# Patient Record
Sex: Male | Born: 1960 | Race: White | Hispanic: No | Marital: Married | Smoking: Current every day smoker
Health system: Southern US, Community
[De-identification: ages and names within clinical notes are randomized; demographics above are authoritative.]

## PROBLEM LIST (undated history)

## (undated) ENCOUNTER — Ambulatory Visit: Admission: EM | Payer: 59

## (undated) DIAGNOSIS — I2510 Atherosclerotic heart disease of native coronary artery without angina pectoris: Secondary | ICD-10-CM

## (undated) DIAGNOSIS — F1410 Cocaine abuse, uncomplicated: Secondary | ICD-10-CM

## (undated) DIAGNOSIS — E785 Hyperlipidemia, unspecified: Secondary | ICD-10-CM

## (undated) DIAGNOSIS — R569 Unspecified convulsions: Secondary | ICD-10-CM

## (undated) DIAGNOSIS — E669 Obesity, unspecified: Secondary | ICD-10-CM

## (undated) DIAGNOSIS — K219 Gastro-esophageal reflux disease without esophagitis: Secondary | ICD-10-CM

## (undated) DIAGNOSIS — F17200 Nicotine dependence, unspecified, uncomplicated: Secondary | ICD-10-CM

## (undated) DIAGNOSIS — I219 Acute myocardial infarction, unspecified: Secondary | ICD-10-CM

## (undated) DIAGNOSIS — M545 Low back pain, unspecified: Secondary | ICD-10-CM

## (undated) DIAGNOSIS — F419 Anxiety disorder, unspecified: Secondary | ICD-10-CM

## (undated) DIAGNOSIS — G4733 Obstructive sleep apnea (adult) (pediatric): Secondary | ICD-10-CM

## (undated) HISTORY — DX: Low back pain: M54.5

## (undated) HISTORY — DX: Unspecified convulsions: R56.9

## (undated) HISTORY — DX: Gastro-esophageal reflux disease without esophagitis: K21.9

## (undated) HISTORY — DX: Hyperlipidemia, unspecified: E78.5

## (undated) HISTORY — DX: Nicotine dependence, unspecified, uncomplicated: F17.200

## (undated) HISTORY — DX: Anxiety disorder, unspecified: F41.9

## (undated) HISTORY — PX: FRACTURE SURGERY: SHX138

## (undated) HISTORY — DX: Low back pain, unspecified: M54.50

## (undated) HISTORY — DX: Acute myocardial infarction, unspecified: I21.9

## (undated) HISTORY — DX: Obstructive sleep apnea (adult) (pediatric): G47.33

## (undated) HISTORY — DX: Obesity, unspecified: E66.9

## (undated) HISTORY — DX: Atherosclerotic heart disease of native coronary artery without angina pectoris: I25.10

## (undated) HISTORY — DX: Cocaine abuse, uncomplicated: F14.10

## (undated) HISTORY — PX: BACK SURGERY: SHX140

---

## 2002-06-01 ENCOUNTER — Emergency Department (HOSPITAL_COMMUNITY): Admission: EM | Admit: 2002-06-01 | Discharge: 2002-06-01 | Payer: Self-pay | Admitting: *Deleted

## 2002-06-01 ENCOUNTER — Encounter: Payer: Self-pay | Admitting: *Deleted

## 2003-07-23 ENCOUNTER — Inpatient Hospital Stay (HOSPITAL_COMMUNITY): Admission: EM | Admit: 2003-07-23 | Discharge: 2003-07-25 | Payer: Self-pay | Admitting: Emergency Medicine

## 2003-09-25 ENCOUNTER — Emergency Department (HOSPITAL_COMMUNITY): Admission: EM | Admit: 2003-09-25 | Discharge: 2003-09-25 | Payer: Self-pay | Admitting: Emergency Medicine

## 2004-12-13 ENCOUNTER — Ambulatory Visit: Payer: Self-pay | Admitting: Internal Medicine

## 2005-01-08 ENCOUNTER — Ambulatory Visit: Payer: Self-pay

## 2005-01-29 ENCOUNTER — Ambulatory Visit: Payer: Self-pay | Admitting: Pulmonary Disease

## 2005-02-26 ENCOUNTER — Ambulatory Visit: Payer: Self-pay | Admitting: Pulmonary Disease

## 2006-01-16 ENCOUNTER — Ambulatory Visit: Payer: Self-pay | Admitting: Internal Medicine

## 2006-04-21 ENCOUNTER — Ambulatory Visit: Payer: Self-pay | Admitting: Internal Medicine

## 2006-07-25 ENCOUNTER — Ambulatory Visit: Payer: Self-pay | Admitting: Internal Medicine

## 2006-12-20 ENCOUNTER — Encounter: Payer: Self-pay | Admitting: Internal Medicine

## 2006-12-20 DIAGNOSIS — I209 Angina pectoris, unspecified: Secondary | ICD-10-CM | POA: Insufficient documentation

## 2006-12-20 DIAGNOSIS — I252 Old myocardial infarction: Secondary | ICD-10-CM | POA: Insufficient documentation

## 2006-12-20 DIAGNOSIS — K219 Gastro-esophageal reflux disease without esophagitis: Secondary | ICD-10-CM

## 2006-12-20 DIAGNOSIS — I25119 Atherosclerotic heart disease of native coronary artery with unspecified angina pectoris: Secondary | ICD-10-CM

## 2006-12-20 DIAGNOSIS — F419 Anxiety disorder, unspecified: Secondary | ICD-10-CM

## 2007-02-26 ENCOUNTER — Encounter: Payer: Self-pay | Admitting: Internal Medicine

## 2007-02-26 ENCOUNTER — Ambulatory Visit: Payer: Self-pay | Admitting: Internal Medicine

## 2007-03-09 ENCOUNTER — Telehealth: Payer: Self-pay | Admitting: Internal Medicine

## 2007-03-10 DIAGNOSIS — G40909 Epilepsy, unspecified, not intractable, without status epilepticus: Secondary | ICD-10-CM | POA: Insufficient documentation

## 2007-05-08 ENCOUNTER — Telehealth: Payer: Self-pay | Admitting: Internal Medicine

## 2007-07-14 ENCOUNTER — Telehealth: Payer: Self-pay | Admitting: Internal Medicine

## 2007-08-20 ENCOUNTER — Ambulatory Visit: Payer: Self-pay | Admitting: Internal Medicine

## 2007-08-20 DIAGNOSIS — K921 Melena: Secondary | ICD-10-CM | POA: Insufficient documentation

## 2007-08-20 DIAGNOSIS — F17200 Nicotine dependence, unspecified, uncomplicated: Secondary | ICD-10-CM | POA: Insufficient documentation

## 2007-08-20 HISTORY — DX: Melena: K92.1

## 2007-09-03 ENCOUNTER — Ambulatory Visit: Payer: Self-pay | Admitting: Pulmonary Disease

## 2007-09-03 DIAGNOSIS — G478 Other sleep disorders: Secondary | ICD-10-CM | POA: Insufficient documentation

## 2007-09-08 ENCOUNTER — Ambulatory Visit (HOSPITAL_BASED_OUTPATIENT_CLINIC_OR_DEPARTMENT_OTHER): Admission: RE | Admit: 2007-09-08 | Discharge: 2007-09-08 | Payer: Self-pay | Admitting: Pulmonary Disease

## 2007-09-08 ENCOUNTER — Ambulatory Visit: Payer: Self-pay | Admitting: Pulmonary Disease

## 2007-09-09 ENCOUNTER — Ambulatory Visit: Payer: Self-pay | Admitting: Gastroenterology

## 2007-09-10 ENCOUNTER — Encounter: Payer: Self-pay | Admitting: Pulmonary Disease

## 2007-10-12 HISTORY — PX: COLONOSCOPY: SHX174

## 2007-10-13 ENCOUNTER — Encounter: Payer: Self-pay | Admitting: Gastroenterology

## 2007-10-13 ENCOUNTER — Ambulatory Visit: Payer: Self-pay | Admitting: Gastroenterology

## 2007-10-13 LAB — HM COLONOSCOPY

## 2007-10-16 ENCOUNTER — Encounter: Payer: Self-pay | Admitting: Gastroenterology

## 2007-12-15 ENCOUNTER — Ambulatory Visit: Payer: Self-pay | Admitting: Internal Medicine

## 2008-03-16 ENCOUNTER — Encounter: Payer: Self-pay | Admitting: Internal Medicine

## 2008-03-17 ENCOUNTER — Ambulatory Visit: Payer: Self-pay | Admitting: Internal Medicine

## 2008-06-15 ENCOUNTER — Ambulatory Visit: Payer: Self-pay | Admitting: Internal Medicine

## 2008-06-15 LAB — CONVERTED CEMR LAB
BUN: 14 mg/dL (ref 6–23)
CO2: 28 meq/L (ref 19–32)
Calcium: 8.9 mg/dL (ref 8.4–10.5)
Chloride: 107 meq/L (ref 96–112)
Cholesterol: 181 mg/dL (ref 0–200)
Creatinine, Ser: 0.8 mg/dL (ref 0.4–1.5)
GFR calc Af Amer: 134 mL/min
GFR calc non Af Amer: 111 mL/min
Glucose, Bld: 147 mg/dL — ABNORMAL HIGH (ref 70–99)
HDL: 38.6 mg/dL — ABNORMAL LOW (ref >39.0)
LDL Cholesterol: 114 mg/dL — ABNORMAL HIGH (ref 0–99)
Potassium: 3.9 meq/L (ref 3.5–5.1)
Sodium: 142 meq/L (ref 135–145)
TSH: 1.89 microintl units/mL (ref 0.35–5.50)
Total CHOL/HDL Ratio: 4.7
Triglycerides: 142 mg/dL (ref 0–149)
VLDL: 28 mg/dL (ref 0–40)

## 2008-06-16 ENCOUNTER — Ambulatory Visit: Payer: Self-pay | Admitting: Internal Medicine

## 2008-06-16 DIAGNOSIS — R7309 Other abnormal glucose: Secondary | ICD-10-CM | POA: Insufficient documentation

## 2008-09-30 ENCOUNTER — Ambulatory Visit: Payer: Self-pay | Admitting: Internal Medicine

## 2008-09-30 LAB — CONVERTED CEMR LAB
BUN: 13 mg/dL (ref 6–23)
CO2: 28 meq/L (ref 19–32)
Calcium: 8.7 mg/dL (ref 8.4–10.5)
Chloride: 111 meq/L (ref 96–112)
Cholesterol: 183 mg/dL (ref 0–200)
Creatinine, Ser: 0.8 mg/dL (ref 0.4–1.5)
Direct LDL: 106 mg/dL
GFR calc non Af Amer: 110.41 mL/min (ref >60)
Glucose, Bld: 106 mg/dL — ABNORMAL HIGH (ref 70–99)
HDL: 35.4 mg/dL — ABNORMAL LOW (ref >39.00)
Hgb A1c MFr Bld: 6.2 % (ref 4.6–6.5)
PSA: 0.25 ng/mL (ref 0.10–4.00)
Potassium: 4.1 meq/L (ref 3.5–5.1)
Sodium: 144 meq/L (ref 135–145)
Total CHOL/HDL Ratio: 5
Triglycerides: 359 mg/dL — ABNORMAL HIGH (ref 0.0–149.0)
VLDL: 71.8 mg/dL — ABNORMAL HIGH (ref 0.0–40.0)

## 2008-10-03 ENCOUNTER — Ambulatory Visit: Payer: Self-pay | Admitting: Internal Medicine

## 2008-10-14 ENCOUNTER — Telehealth: Payer: Self-pay | Admitting: Internal Medicine

## 2008-10-14 ENCOUNTER — Ambulatory Visit: Payer: Self-pay | Admitting: Internal Medicine

## 2008-10-14 DIAGNOSIS — L02419 Cutaneous abscess of limb, unspecified: Secondary | ICD-10-CM

## 2008-10-14 DIAGNOSIS — L03119 Cellulitis of unspecified part of limb: Secondary | ICD-10-CM

## 2008-10-14 DIAGNOSIS — T148XXA Other injury of unspecified body region, initial encounter: Secondary | ICD-10-CM | POA: Insufficient documentation

## 2008-10-14 HISTORY — DX: Cellulitis of unspecified part of limb: L02.419

## 2009-01-31 ENCOUNTER — Ambulatory Visit: Payer: Self-pay | Admitting: Internal Medicine

## 2009-02-16 ENCOUNTER — Ambulatory Visit: Payer: Self-pay | Admitting: Pulmonary Disease

## 2009-02-16 DIAGNOSIS — G4733 Obstructive sleep apnea (adult) (pediatric): Secondary | ICD-10-CM | POA: Insufficient documentation

## 2009-03-31 ENCOUNTER — Telehealth (INDEPENDENT_AMBULATORY_CARE_PROVIDER_SITE_OTHER): Payer: Self-pay | Admitting: *Deleted

## 2009-04-25 ENCOUNTER — Ambulatory Visit: Payer: Self-pay | Admitting: Internal Medicine

## 2009-04-25 DIAGNOSIS — R079 Chest pain, unspecified: Secondary | ICD-10-CM | POA: Insufficient documentation

## 2009-04-25 DIAGNOSIS — M79609 Pain in unspecified limb: Secondary | ICD-10-CM

## 2009-04-25 DIAGNOSIS — M5416 Radiculopathy, lumbar region: Secondary | ICD-10-CM | POA: Insufficient documentation

## 2009-04-25 HISTORY — DX: Pain in unspecified limb: M79.609

## 2009-04-28 ENCOUNTER — Telehealth: Payer: Self-pay | Admitting: Internal Medicine

## 2009-05-10 ENCOUNTER — Telehealth (INDEPENDENT_AMBULATORY_CARE_PROVIDER_SITE_OTHER): Payer: Self-pay | Admitting: *Deleted

## 2009-05-10 ENCOUNTER — Ambulatory Visit: Payer: Self-pay | Admitting: Cardiovascular Disease

## 2009-05-10 ENCOUNTER — Ambulatory Visit: Payer: Self-pay

## 2009-05-10 ENCOUNTER — Encounter (HOSPITAL_COMMUNITY): Admission: RE | Admit: 2009-05-10 | Discharge: 2009-07-17 | Payer: Self-pay | Admitting: Internal Medicine

## 2009-05-11 ENCOUNTER — Ambulatory Visit: Payer: Self-pay

## 2009-05-11 ENCOUNTER — Encounter: Payer: Self-pay | Admitting: Internal Medicine

## 2009-05-16 ENCOUNTER — Telehealth: Payer: Self-pay | Admitting: Internal Medicine

## 2009-06-12 ENCOUNTER — Ambulatory Visit: Payer: Self-pay | Admitting: Internal Medicine

## 2009-06-12 ENCOUNTER — Telehealth (INDEPENDENT_AMBULATORY_CARE_PROVIDER_SITE_OTHER): Payer: Self-pay | Admitting: *Deleted

## 2009-06-13 LAB — CONVERTED CEMR LAB
Albumin: 4.3 g/dL (ref 3.5–5.2)
Alkaline Phosphatase: 72 units/L (ref 39–117)
BUN: 13 mg/dL (ref 6–23)
Calcium: 9.1 mg/dL (ref 8.4–10.5)
Cholesterol: 174 mg/dL (ref 0–200)
Creatinine, Ser: 0.8 mg/dL (ref 0.4–1.5)
Direct LDL: 79.6 mg/dL
Eosinophils Relative: 2.9 % (ref 0.0–5.0)
GFR calc non Af Amer: 110.07 mL/min (ref >60)
Glucose, Bld: 124 mg/dL — ABNORMAL HIGH (ref 70–99)
HCT: 48.9 % (ref 39.0–52.0)
Ketones, ur: NEGATIVE mg/dL
Lymphs Abs: 1.8 10*3/uL (ref 0.7–4.0)
MCV: 91.7 fL (ref 78.0–100.0)
Monocytes Absolute: 0.5 10*3/uL (ref 0.1–1.0)
Neutro Abs: 3.1 10*3/uL (ref 1.4–7.7)
Platelets: 136 10*3/uL — ABNORMAL LOW (ref 150.0–400.0)
Sodium: 141 meq/L (ref 135–145)
Specific Gravity, Urine: 1.02 (ref 1.000–1.030)
TSH: 1.94 microintl units/mL (ref 0.35–5.50)
Triglycerides: 317 mg/dL — ABNORMAL HIGH (ref 0.0–149.0)
Uric Acid, Serum: 7.5 mg/dL (ref 4.0–7.8)
Urine Glucose: NEGATIVE mg/dL
WBC: 5.6 10*3/uL (ref 4.5–10.5)
pH: 7 (ref 5.0–8.0)

## 2009-08-18 ENCOUNTER — Telehealth: Payer: Self-pay | Admitting: Internal Medicine

## 2009-08-20 ENCOUNTER — Encounter: Payer: Self-pay | Admitting: Pulmonary Disease

## 2009-08-27 ENCOUNTER — Encounter: Payer: Self-pay | Admitting: Pulmonary Disease

## 2009-09-12 ENCOUNTER — Ambulatory Visit: Payer: Self-pay | Admitting: Internal Medicine

## 2009-12-19 ENCOUNTER — Telehealth: Payer: Self-pay | Admitting: Internal Medicine

## 2009-12-20 ENCOUNTER — Ambulatory Visit: Payer: Self-pay | Admitting: Internal Medicine

## 2010-01-08 ENCOUNTER — Telehealth: Payer: Self-pay | Admitting: Cardiology

## 2010-01-08 DIAGNOSIS — R0602 Shortness of breath: Secondary | ICD-10-CM | POA: Insufficient documentation

## 2010-01-10 ENCOUNTER — Ambulatory Visit: Payer: Self-pay

## 2010-01-10 ENCOUNTER — Ambulatory Visit (HOSPITAL_COMMUNITY): Admission: RE | Admit: 2010-01-10 | Discharge: 2010-01-10 | Payer: Self-pay | Admitting: Cardiology

## 2010-01-10 ENCOUNTER — Ambulatory Visit: Payer: Self-pay | Admitting: Cardiovascular Disease

## 2010-01-10 ENCOUNTER — Encounter: Payer: Self-pay | Admitting: Cardiology

## 2010-01-11 ENCOUNTER — Ambulatory Visit: Payer: Self-pay | Admitting: Cardiology

## 2010-01-11 DIAGNOSIS — I10 Essential (primary) hypertension: Secondary | ICD-10-CM | POA: Insufficient documentation

## 2010-01-11 DIAGNOSIS — E785 Hyperlipidemia, unspecified: Secondary | ICD-10-CM | POA: Insufficient documentation

## 2010-01-12 ENCOUNTER — Encounter: Payer: Self-pay | Admitting: Internal Medicine

## 2010-01-17 ENCOUNTER — Inpatient Hospital Stay (HOSPITAL_BASED_OUTPATIENT_CLINIC_OR_DEPARTMENT_OTHER): Admission: RE | Admit: 2010-01-17 | Discharge: 2010-01-17 | Payer: Self-pay | Admitting: Cardiology

## 2010-01-17 ENCOUNTER — Ambulatory Visit: Payer: Self-pay | Admitting: Cardiology

## 2010-02-08 ENCOUNTER — Ambulatory Visit: Payer: Self-pay | Admitting: Cardiology

## 2010-03-23 ENCOUNTER — Ambulatory Visit: Payer: Self-pay | Admitting: Internal Medicine

## 2010-03-26 LAB — CONVERTED CEMR LAB
BUN: 20 mg/dL (ref 6–23)
Bilirubin, Direct: 0.1 mg/dL (ref 0.0–0.3)
CO2: 29 meq/L (ref 19–32)
Chloride: 107 meq/L (ref 96–112)
Cholesterol: 176 mg/dL (ref 0–200)
Direct LDL: 102.4 mg/dL
Glucose, Bld: 108 mg/dL — ABNORMAL HIGH (ref 70–99)
HDL: 32.3 mg/dL — ABNORMAL LOW (ref >39.00)
Potassium: 4.6 meq/L (ref 3.5–5.1)
Total Bilirubin: 1.1 mg/dL (ref 0.3–1.2)
Total CHOL/HDL Ratio: 5
Total Protein: 6.6 g/dL (ref 6.0–8.3)
Triglycerides: 232 mg/dL — ABNORMAL HIGH (ref 0.0–149.0)

## 2010-05-16 ENCOUNTER — Telehealth: Payer: Self-pay | Admitting: Internal Medicine

## 2010-06-10 LAB — CONVERTED CEMR LAB
BUN: 20 mg/dL (ref 6–23)
Basophils Absolute: 0 10*3/uL (ref 0.0–0.1)
Basophils Relative: 0.3 % (ref 0.0–3.0)
CO2: 27 meq/L (ref 19–32)
Calcium: 9.6 mg/dL (ref 8.4–10.5)
Chloride: 105 meq/L (ref 96–112)
Creatinine, Ser: 0.8 mg/dL (ref 0.4–1.5)
Eosinophils Absolute: 0.2 10*3/uL (ref 0.0–0.7)
Eosinophils Relative: 2.6 % (ref 0.0–5.0)
GFR calc non Af Amer: 116.49 mL/min (ref >60)
Glucose, Bld: 88 mg/dL (ref 70–99)
HCT: 45.1 % (ref 39.0–52.0)
Hemoglobin: 16.1 g/dL (ref 13.0–17.0)
INR: 1 (ref 0.8–1.0)
Lymphocytes Relative: 31.5 % (ref 12.0–46.0)
Lymphs Abs: 2.7 10*3/uL (ref 0.7–4.0)
MCHC: 35.7 g/dL (ref 30.0–36.0)
MCV: 90.6 fL (ref 78.0–100.0)
Monocytes Absolute: 0.8 10*3/uL (ref 0.1–1.0)
Monocytes Relative: 9.4 % (ref 3.0–12.0)
Neutro Abs: 4.9 10*3/uL (ref 1.4–7.7)
Neutrophils Relative %: 56.2 % (ref 43.0–77.0)
Platelets: 165 10*3/uL (ref 150.0–400.0)
Potassium: 4.2 meq/L (ref 3.5–5.1)
Prothrombin Time: 10.6 s (ref 9.7–11.8)
RBC: 4.98 M/uL (ref 4.22–5.81)
RDW: 12.7 % (ref 11.5–14.6)
Sodium: 141 meq/L (ref 135–145)
WBC: 8.7 10*3/uL (ref 4.5–10.5)

## 2010-06-12 NOTE — Progress Notes (Signed)
Summary: xanax   Phone Note Call from Patient Call back at Home Phone [phone] Call back at [phone]   Caller: Patient Call For: Dr Cletis Media Summary of Call: Pt states pharamacy has been [facility]ing prescriptions [facility] X[facility]ex[phone] and pharmacy states office has not responded. Initial call taken by: Verdell Face,  August 18, 2009 11:11 AM  Follow-up for Phone Call        Is it ok to refill this medication for pt?Marland KitchenMarland KitchenMarland KitchenAlvy Beal Archie CMA  August 18, 2009 11:36 AM   Additional Follow-up for Phone Call Additional follow up Details #1::        OK x3 Additional Follow-up by: Tresa Garter MD,  August 18, 2009 3:12 PM    Additional Follow-up for Phone Call Additional follow up Details #2::    Pt informed rx called in  Follow-up by: Lamar Sprinkles, CMA,  August 18, 2009 4:28 PM  Prescriptions: ALPRAZOLAM 2 MG  TABS (ALPRAZOLAM) tid as needed  #90 x 2   Entered by:   Lamar Sprinkles, CMA   Authorized by:   Tresa Garter MD   Signed by:   Lamar Sprinkles, CMA on 08/18/2009   Method used:   Telephoned to ...       [facility] [phone]* (retail)       [address]       [place], [place]  [address]       Ph: [phone]       Fax: [phone]   RxID:   [id number]

## 2010-06-12 NOTE — Miscellaneous (Signed)
  Clinical Lists Changes  Medications: Rx of FREESTYLE LITE TEST  STRP (GLUCOSE BLOOD) once daily prn;  #50 x 6;  Signed;  Entered by: Tresa Garter MD;  Authorized by: Tresa Garter MD;  Method used: Print then Give to Patient    Prescriptions: FREESTYLE LITE TEST  STRP (GLUCOSE BLOOD) once daily prn  #50 x 6   Entered and Authorized by:   Tresa Garter MD   Signed by:   Tresa Garter MD on 01/12/2010   Method used:   Print then Give to Patient   RxID:   [id number]

## 2010-06-12 NOTE — Cardiovascular Report (Signed)
Summary: Pre Cath Orders   Pre Cath Orders   Imported By: Roderic Ovens 01/24/2010 15:08:47  _____________________________________________________________________  External Attachment:    Type:   Image     Comment:   External Document

## 2010-06-12 NOTE — Progress Notes (Signed)
Summary: REQ A CALL FROM DR  Phone Note Call from Patient Call back at Work Phone [phone] Call back at [phone]   Summary of Call: Patient is requesting a call from Dr Posey Rea.   Initial call taken by: Lamar Sprinkles, CMA,  May 16, 2009 1:58 PM  Follow-up for Phone Call        WANTS RESULTS OF STRESS TEST. Follow-up by: Hilarie Fredrickson,  May 17, 2009 3:17 PM  Additional Follow-up for Phone Call Additional follow up Details #1::        Stress test  was OK. Keep return office visit, stay on diet Additional Follow-up by: Tresa Garter MD,  May 18, 2009 1:08 PM    Additional Follow-up for Phone Call Additional follow up Details #2::    Spoke with patient, informed of above. He was very upset that Dr Posey Rea did not call him back directly. I offered pt an apt tomorrow with Dr Posey Rea and offered to help him with his questions. He said it was not about him, it was about his wife. So I offered an apt for her, he screamed at me twice that he was going to come up to the office tomorrow [employer]) and show his a** then he hung up.  Follow-up by: Lamar Sprinkles, CMA,  May 18, 2009 4:23 PM  Additional Follow-up for Phone Call Additional follow up Details #3:: Details for Additional Follow-up Action Taken: I called him - he apologized for rudeness - he was very stressed about his wife Additional Follow-up by: Tresa Garter MD,  May 19, 2009 1:22 PM

## 2010-06-12 NOTE — Assessment & Plan Note (Signed)
Summary: 3 MO ROV /NWS  #   Vital Signs:  Patient profile:   50 year old male Height:      72 inches Weight:      319.75 pounds BMI:     43.52 O2 Sat:      94 % on Room air Temp:     98.0 degrees F oral Pulse rate:   91 / minute BP sitting:   140 / 80  (left arm) Cuff size:   large  Vitals Entered By: Lucious Groves (Sep 12, 2009 8:15 AM)  O2 Flow:  Room air CC: 3 mo rtn ov-Pt also needs refills of  Alprazolam, Naproxen, and Tramadol./kb Is Patient Diabetic? Yes Pain Assessment Patient in pain? no        Primary Care Provider:  Tresa Garter MD  CC:  3 mo rtn ov-Pt also needs refills of  Alprazolam, Naproxen, and and Tramadol./kb.  History of Present Illness: The patient presents for a follow up of hypertension, diabetes, hyperlipidemia, anxiety. Lost wt after he quit soft drinks and bisquits. No CP  Current Medications (verified): 1)  Alprazolam 2 Mg  Tabs (Alprazolam) .... Tid As Needed 2)  Aspirin 81 Mg  Tbec (Aspirin) .... One By Mouth Every Day 3)  Freestyle Lite Test  Strp (Glucose Blood) .... Once Daily Prn 4)  Vitamin D3 1000 Unit  Tabs (Cholecalciferol) .[name].. 1 By Mouth Daily 5)  Naproxen 500 Mg Tabs (Naproxen) .[name].. 1 By Mouth Two Times A Day Pc For Pain/arthritis 6)  Tramadol Hcl 50 Mg Tabs (Tramadol Hcl) .[name].. 1-2 Tabs By Mouth Two Times A Day As Needed Pain  Allergies (verified): No Known Drug Allergies  Past History:  Past Medical History: Last updated: 04/25/2009 Anxiety Coronary artery disease GERD Myocardial infarction, hx of H/o coccaine abuse OSA      - PSG September 10 2007, AHI 119 Obesity Tobacco and alcohol abuse Low back pain  Social History: Last updated: 09/03/2007 Occupation: car Airline pilot Married, two kids Current Smoker 2 ppd  Alcohol use-yes 12 beers a week  Review of Systems  The patient denies fever, prolonged cough, and abdominal pain.    Physical Exam  General:  alert and overweight-appearing, NAD Nose:  no external  deformity and no nasal discharge.   Mouth:  no gingival abnormalities and pharynx pink and moist.   Neck:  supple and no masses.   Lungs:  CTA Heart:  RRR Abdomen:  Obese, soft and non-tender.   Msk:  Lumbar-sacral spine is tender to palpation over paraspinal muscles and painfull with the ROM  Neurologic:  alert & oriented X3.   Skin:  Clear Psych:  Oriented X3.     Impression & Recommendations:  Problem # 1:  LOW BACK PAIN (ICD-724.2) Assessment Improved  His updated medication list for this problem includes:    Aspirin 81 Mg Tbec (Aspirin) ..... One by mouth every day    Naproxen 500 Mg Tabs (Naproxen) .[name]... 1 by mouth two times a day pc for pain/arthritis    Tramadol Hcl 50 Mg Tabs (Tramadol hcl) .[name]... 1-2 tabs by mouth two times a day as needed pain  Problem # 2:  MORBID OBESITY (ICD-278.01) Assessment: Improved  Problem # 3:  CORONARY ARTERY DISEASE (ICD-414.00) Assessment: Unchanged  His updated medication list for this problem includes:    Aspirin 81 Mg Tbec (Aspirin) ..... One by mouth every day  Problem # 4:  ANXIETY (ICD-300.00) Assessment: Improved  His updated medication list  for this problem includes:    Alprazolam 2 Mg Tabs (Alprazolam) .[name]... Tid as needed  Complete Medication List: 1)  Alprazolam 2 Mg Tabs (Alprazolam) .... Tid as needed 2)  Aspirin 81 Mg Tbec (Aspirin) .... One by mouth every day 3)  Freestyle Lite Test Strp (Glucose blood) .... Once daily prn 4)  Vitamin D3 1000 Unit Tabs (Cholecalciferol) .[name].. 1 by mouth daily 5)  Naproxen 500 Mg Tabs (Naproxen) .[name].. 1 by mouth two times a day pc for pain/arthritis 6)  Tramadol Hcl 50 Mg Tabs (Tramadol hcl) .[name].. 1-2 tabs by mouth two times a day as needed pain  Patient Instructions: 1)  Please schedule a follow-up appointment in 3 months. 2)  BMP prior to visit, ICD-9: 3)  Hepatic Panel prior to visit, ICD-9: 4)  TSH prior to visit, ICD-9: 995.20 5)  CBC w/ Diff prior to visit, ICD-9: 6)  Use  stretching and balance exercises that I have provided (15 min. or longer every day)  Prescriptions: TRAMADOL HCL 50 MG TABS (TRAMADOL HCL) 1-2 tabs by mouth two times a day as needed pain  #120 x 3   Entered and Authorized by:   Tresa Garter MD   Signed by:   Tresa Garter MD on 09/12/2009   Method used:   Print then Give to Patient   RxID:   [id number] NAPROXEN 500 MG TABS (NAPROXEN) 1 by mouth two times a day pc for pain/arthritis  #60 x 3   Entered and Authorized by:   Tresa Garter MD   Signed by:   Tresa Garter MD on 09/12/2009   Method used:   Print then Give to Patient   RxID:   [id number] ALPRAZOLAM 2 MG  TABS (ALPRAZOLAM) tid as needed  #90 x 2   Entered and Authorized by:   Tresa Garter MD   Signed by:   Tresa Garter MD on 09/12/2009   Method used:   Print then Give to Patient   RxID:   [id number]

## 2010-06-12 NOTE — Assessment & Plan Note (Signed)
Summary: np eval f/u echo done 01-10-10/@ 1:30 per anne l   Visit Type:  Initial Consult Referring Provider:  Dr. Posey Rea Primary Provider:  Georgina Quint Plotnikov MD  CC:  chest pain.  History of Present Illness: 50 yo with history of HTN, diet-controlled diabetes, obesity, and smoking presents for evaluation of chest pain.  He has had some chest pain for a long period of time.  It used to occur about once a month and was related to stress.  He would feel a tightness in his central chest.  He had a Lexiscan myoview in 12/10 showing EF 46% and possible old inferior infarction.  ECG has nonspecific T wave changes but no Qs.  Over the last 2 months, chest pain has become more frequent.  It tends to occur 2-3 times a week.  It is associated with diaphoresis and lightheadedness.  No palpitations.  Pain will last 5-10 minutes then resolve.  It continues to be related mostly to stress.  No relation to exertion (though he is not very active).  He has considered going to the ER about this but has held off so far. He says that he was told in the distant past that he had had an MI in the setting of cocaine use.  BP today is 150/97.  Past BPs at Dr. Loren Racer office were in the 140/100 range.    ECG: NSR, lateral T wave flattening.   Labs (1/11): K 4.4, creatinine 0.8, HDL 43, LDL 80, TSH normal, LFTs normal, hgbA1c 6.7%  Current Medications (verified): 1)  Alprazolam 2 Mg  Tabs (Alprazolam) .... Tid As Needed 2)  Aspirin 81 Mg  Tbec (Aspirin) .... One By Mouth Every Day 3)  Freestyle Lite Test  Strp (Glucose Blood) .... Once Daily Prn 4)  Vitamin D3 1000 Unit  Tabs (Cholecalciferol) .[name].. 1 By Mouth Daily 5)  Naproxen 500 Mg Tabs (Naproxen) .[name].. 1 By Mouth Two Times A Day Pc For Pain/arthritis 6)  Tramadol Hcl 50 Mg Tabs (Tramadol Hcl) .[name].. 1-2 Tabs By Mouth Two Times A Day As Needed Pain 7)  Nitrostat 0.4 Mg Subl (Nitroglycerin) .... One Under Your Tongue As Needed For Chest Pain  Allergies  (verified): No Known Drug Allergies  Past History:  Past Medical History: 1. Anxiety 2. 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  Lexiscan myoview (12/10) with EF 46%, diffuse hypokinesis, possible old inferior infarction.  Echo (8/11): EF 55%, basal inferior hypokinesis, mild LVH, no significant valve abnormalities.   3. GERD 4. H/o cocaine abuse 5. OSA on CPAP      - PSG September 10 2007, AHI 119 6. Obesity 7. Tobacco abuse 8. Low back pain 9. Hyperlipidemia: untreated. 10.  DM II: Borderline, diet-controlled.   Family History: Grandfather died of MI at 45, Father had MI at 36, uncle with atrial fibrillation, siblings do not have significant coronary disease.    Social History: Occupation: Scientist, clinical (histocompatibility and immunogenetics) in [employer] Married, two kids Current Smoker 2 ppd Alcohol use-yes 12 beers a week Prior cocaine abuse (quit years ago)  Review of Systems       All systems reviewed and negative except as per HPI.   Vital Signs:  Patient profile:   50 year old male Height:      72 inches Weight:      317 pounds BMI:     43.15 Pulse rate:   97 / minute BP sitting:  150 / 90  (left arm) Cuff size:   large  Vitals Entered By: Burnett Kanaris, CNA (January 11, 2010 3:29 PM)  Physical Exam  General:  Well developed, well nourished, in no acute distress.  Obese.  Head:  normocephalic and atraumatic Nose:  no deformity, discharge, inflammation, or lesions Mouth:  Teeth, gums and palate normal. Oral mucosa normal. Neck:  Neck thick, no JVD. No masses, thyromegaly or abnormal cervical nodes. Lungs:  Clear bilaterally to auscultation and percussion. Heart:  Chest non-tender; regular rate and rhythm, S1, S2 without murmurs, rubs or gallops. Carotid upstroke normal, no bruit.  Pedals normal pulses. 1+ ankle edema. Abdomen:  Bowel sounds positive; abdomen soft and non-tender without masses, organomegaly, or hernias noted. No  hepatosplenomegaly. Msk:  Back normal, normal gait. Muscle strength and tone normal. Extremities:  No clubbing or cyanosis. Neurologic:  Alert and oriented x 3. Skin:  tattoos Psych:  Normal affect.   Impression & Recommendations:  Problem # 1:  CHEST PAIN (ICD-786.50) Patient gets episodes of atypical chest pain that seems to be related to stress.  He has been having more frequent and severe episodes in the last 2 months.  He is worried that he has CAD.  Risk factors include diabetes, HTN, hyperlipidemia, smoking.  Stress test in 12/10 was equivocal (EF 46%, fixed inferior defect suggestive of prior MI).  Echo has a possible wall motion abnormality.  I think that given the chest pain, risk factors, and equivocal myoview, I will take him for left heart cath to rule in or out coronary disease.  He would not be a good candidate, given his size, for a coronary CT angiogram.   - LHC, radial access - ASA 81 mg daily  Problem # 2:  TOBACCO USE DISORDER/SMOKER-SMOKING CESSATION DISCUSSED (ICD-305.1) I strongly encouraged him to quit smoking and gave him a prescription for Wellbutrin.   Problem # 3:  HYPERTENSION, UNSPECIFIED (ICD-401.9) BP is elevated and has been so at some of his past appointments.  Given the coexistence of diabetes, I will start him on lisinopril 10 mg daily with BMET in 2 wks.   Problem # 4:  HYPERLIPIDEMIA-MIXED (ICD-272.4) LDL was 80 in 1/11.  Need to repeat.    Other Orders: TLB-BMP (Basic Metabolic Panel-BMET) (80048-METABOL) TLB-CBC Platelet - w/Differential (85025-CBCD) TLB-PT (Protime) (85610-PTP) Cardiac Catheterization (Cardiac Cath)  Patient Instructions: 1)  Your physician recommends that you have lab today--BMP/CBC/PT --786.50 401.9 2)  Start Lisinopril 10mg  daily. 3)  Start Welbutrin 150mg  daily for 3 days then 150mg  twice a day. This is to help you stop smoking. You can use this for 12 weeks. 4)  Your physician has requested that you have a cardiac  catheterization.  Cardiac catheterization is used to diagnose and/or treat various heart conditions. Doctors may recommend this procedure for a number of different reasons. The most common reason is to evaluate chest pain. Chest pain can be a symptom of coronary artery disease (CAD), and cardiac catheterization can show whether plaque is narrowing or blocking your heart's arteries. This procedure is also used to evaluate the valves, as well as measure the blood flow and oxygen levels in different parts of your heart.  For further information please visit [email or web address]  Please follow instruction sheet, as given.September 7,2011 5)  Your physician recommends that you schedule a follow-up appointment in: 2-3 weeks with Dr Shirlee Latch. You should have lab when you see Dr Shirlee Latch in 2-3 weeks--BMP 786.50 401.9 Prescriptions: WELLBUTRIN SR 150 MG XR12H-TAB (  BUPROPION HCL) one daily for 3 days then one twice a day  #60 x 2   Entered by:   Katina Dung, RN, BSN   Authorized by:   Marca Ancona, MD   Signed by:   Katina Dung, RN, BSN on 01/11/2010   Method used:   [facility]ron[facility] to        [facility] [phone]* (retail)       [address]       [place], [place]  [address]       Ph: [phone]       Fax: [phone]   RxID:   [id number] LISINOPRIL 10 MG TABS (LISINOPRIL) one daily  #30 x 6   Entered by:   Katina Dung, RN, BSN   Authorized by:   Marca Ancona, MD   Signed by:   Katina Dung, RN, BSN on 01/11/2010   Method used:   Electronically to        [facility] [phone]* (retail)       [address]       [place], [place]  [address]       Ph: [phone]       Fax: [phone]   RxID:   [id number]

## 2010-06-12 NOTE — Progress Notes (Signed)
Summary: RF Alprazolam  Phone Note Refill Request Message from:  Fax from Pharmacy  Refills Requested: Medication #1:  ALPRAZOLAM 2 MG  TABS tid as needed   Dosage confirmed as above?Dosage Confirmed   Supply Requested: 90   Last Refilled: 11/15/2009  Method Requested: Telephone to Pharmacy Next Appointment Scheduled: 12-20-09 Initial call taken by: Lanier Prude, [facility]),  December 19, 2009 2:07 PM  Follow-up for Phone Call        ok x 3 months  Follow-up by: Tresa Garter MD,  December 19, 2009 5:46 PM  Additional Follow-up for Phone Call Additional follow up Details #1::        pt given written Rx in office today. Additional Follow-up by: Lanier Prude, [facility]),  December 20, 2009 3:02 PM

## 2010-06-12 NOTE — Progress Notes (Signed)
Summary: chest pain and SOB  Phone Note Call from Patient   Caller: Patient Call For: Dr Shirlee Latch Summary of Call: chest pain/SOB  Follow-up for Phone Call        pt's wife had appointment with Dr Shirlee Latch today--pt came with wife to her appointment --Dr Shirlee Latch talked with pt about recent symptoms -chest pain and SOB--Dr Shirlee Latch recommended NTG as needed, Echo, and appointment with Dr Shirlee Latch this week--I discussed with pt  New Problems: SHORTNESS OF BREATH (ICD-786.05)   New Problems: SHORTNESS OF BREATH (ICD-786.05) New/Updated Medications: NITROSTAT 0.4 MG SUBL (NITROGLYCERIN) one under your tongue as needed for chest pain Prescriptions: NITROSTAT 0.4 MG SUBL (NITROGLYCERIN) one under your tongue as needed for chest pain  #100 x 3   Entered by:   Katina Dung, RN, BSN   Authorized by:   Marca Ancona, MD   Signed by:   Katina Dung, RN, BSN on 01/08/2010   Method used:   Electronically to        [facility] [phone]* (retail)       [address]       [place], [place]  [address]       Ph: [phone]       Fax: [phone]   RxID:   [id number]    Patient Instructions: 1)  Your physician recommended you take 1 tablet (or 1 spray) under tongue at onset of chest pain; you may repeat every 5 minutes for up to 3 doses. If 3 or more doses are required, call 911 and proceed to the ER immediately. 2)  Your physician has requested that you have an echocardiogram.  Echocardiography is a painless test that uses sound waves to create images of your heart. It provides your doctor with information about the size and shape of your heart and how well your heart's chambers and valves are working.  This procedure takes approximately one hour. There are no restrictions for this procedure. 3)  Your physician recommends that you schedule a follow-up appointment this week with Dr Shirlee Latch.

## 2010-06-12 NOTE — Assessment & Plan Note (Signed)
Summary: 1 MO ROV /NWS  #   Vital Signs:  Patient profile:   50 year old male Weight:      333 pounds Temp:     98.1 degrees F oral Pulse rate:   92 / minute BP sitting:   146 / 100  (left arm)  Vitals Entered By: Tora Perches (June 12, 2009 8:13 AM) CC: f/u Is Patient Diabetic? No   Primary Care Provider:  Tresa Garter MD  CC:  f/u.  History of Present Illness: The patient presents for a follow up of hypertension, LBP, hyperlipidemia, DM   Preventive Screening-Counseling & Management  Alcohol-Tobacco     Smoking Status: current  Current Medications (verified): 1)  Alprazolam 2 Mg  Tabs (Alprazolam) .... Tid As Needed 2)  Aspirin 81 Mg  Tbec (Aspirin) .... One By Mouth Every Day 3)  Freestyle Lite Test  Strp (Glucose Blood) .... Once Daily Prn 4)  Vitamin D3 1000 Unit  Tabs (Cholecalciferol) .[name].. 1 By Mouth Daily 5)  Naproxen 500 Mg Tabs (Naproxen) .[name].. 1 By Mouth Two Times A Day Pc For Pain/arthritis 6)  Tramadol Hcl 50 Mg Tabs (Tramadol Hcl) .[name].. 1-2 Tabs By Mouth Two Times A Day As Needed Pain  Allergies (verified): No Known Drug Allergies  Family History: Reviewed history from 09/03/2007 and no changes required. Father - CAD  Social History: Reviewed history from 09/03/2007 and no changes required. Occupation: Retail banker Married, two kids Current Smoker 2 ppd  Alcohol use-yes 12 beers a week  Review of Systems  The patient denies fever, dyspnea on exertion, abdominal pain, suspicious skin lesions, and depression.    Physical Exam  General:  alert and overweight-appearing.  . mild ill  Ears:  External ear exam shows no significant lesions or deformities.  Otoscopic examination reveals clear canals, tympanic membranes are intact bilaterally without bulging, retraction, inflammation or discharge. Hearing is grossly normal bilaterally. Nose:  no external deformity and no nasal discharge.   Mouth:  no gingival abnormalities and pharynx pink and  moist.   Lungs:  CTA Heart:  RRR Abdomen:  Obese, soft and non-tender.   Msk:  Lumbar-sacral spine is tender to palpation over paraspinal muscles and painfull with the ROM  Neurologic:  alert & oriented X3.   Skin:  Clear Psych:  Oriented X3.     Impression & Recommendations:  Problem # 1:  LOW BACK PAIN (ICD-724.2) Assessment Improved  His updated medication list for this problem includes:    Aspirin 81 Mg Tbec (Aspirin) ..... One by mouth every day    Naproxen 500 Mg Tabs (Naproxen) .[name]... 1 by mouth two times a day pc for pain/arthritis    Tramadol Hcl 50 Mg Tabs (Tramadol hcl) .[name]... 1-2 tabs by mouth two times a day as needed pain  Orders: TLB-B12, Serum-Total ONLY (91478-G95) TLB-CBC Platelet - w/Differential (85025-CBCD) TLB-BMP (Basic Metabolic Panel-BMET) (80048-METABOL) TLB-Hepatic/Liver Function Pnl (80076-HEPATIC) TLB-TSH (Thyroid Stimulating Hormone) (84443-TSH) TLB-Uric Acid, Blood (84550-URIC) TLB-Lipid Panel (80061-LIPID) TLB-Udip ONLY (81003-UDIP) TLB-A1C / Hgb A1C (Glycohemoglobin) (83036-A1C)  Problem # 2:  HAND PAIN (ICD-729.5) Assessment: Improved  Problem # 3:  MORBID OBESITY (ICD-278.01) Assessment: Unchanged  Orders: TLB-B12, Serum-Total ONLY (62130-Q65) TLB-CBC Platelet - w/Differential (85025-CBCD) TLB-BMP (Basic Metabolic Panel-BMET) (80048-METABOL) TLB-Hepatic/Liver Function Pnl (80076-HEPATIC) TLB-TSH (Thyroid Stimulating Hormone) (84443-TSH) TLB-Uric Acid, Blood (84550-URIC) TLB-Lipid Panel (80061-LIPID) TLB-Udip ONLY (81003-UDIP) TLB-A1C / Hgb A1C (Glycohemoglobin) (83036-A1C)  Problem # 4:  GERD (ICD-530.81)  Orders: TLB-B12, Serum-Total ONLY (78469-G29) TLB-CBC Platelet -  w/Differential (85025-CBCD) TLB-BMP (Basic Metabolic Panel-BMET) (80048-METABOL) TLB-Hepatic/Liver Function Pnl (80076-HEPATIC) TLB-TSH (Thyroid Stimulating Hormone) (84443-TSH) TLB-Uric Acid, Blood (84550-URIC) TLB-Lipid Panel (80061-LIPID) TLB-Udip ONLY  (81003-UDIP) TLB-A1C / Hgb A1C (Glycohemoglobin) (83036-A1C)  Problem # 5:  CORONARY ARTERY DISEASE (ICD-414.00)  His updated medication list for this problem includes:    Aspirin 81 Mg Tbec (Aspirin) ..... One by mouth every day  Complete Medication List: 1)  Alprazolam 2 Mg Tabs (Alprazolam) .... Tid as needed 2)  Aspirin 81 Mg Tbec (Aspirin) .... One by mouth every day 3)  Freestyle Lite Test Strp (Glucose blood) .... Once daily prn 4)  Vitamin D3 1000 Unit Tabs (Cholecalciferol) .[name].. 1 by mouth daily 5)  Naproxen 500 Mg Tabs (Naproxen) .[name].. 1 by mouth two times a day pc for pain/arthritis 6)  Tramadol Hcl 50 Mg Tabs (Tramadol hcl) .[name].. 1-2 tabs by mouth two times a day as needed pain  Other Orders: Admin 1st Vaccine (59563) Flu Vaccine 66yrs + (87564)  Patient Instructions: 1)  Please schedule a follow-up appointment in 3 months. Prescriptions: ALPRAZOLAM 2 MG  TABS (ALPRAZOLAM) tid as needed  #90 x 3   Entered and Authorized by:   Tresa Garter MD   Signed by:   Tresa Garter MD on 06/12/2009   Method used:   Print then Give to Patient   RxID:   [id number]    Influenza Vaccine (to be given today)  Flu Vaccine Consent Questions     Do you have a history of severe allergic reactions to this vaccine? no    Any prior history of allergic reactions to egg and/or gelatin? no    Do you have a sensitivity to the preservative Thimersol? no    Do you have a past history of Guillan-Barre Syndrome? no    Do you currently have an acute febrile illness? no    Have you ever had a severe reaction to latex? no    Vaccine information given and explained to patient? yes    Are you currently pregnant? no    Lot Number:[id number]   Exp Date:11/09/2009   Site Given  Left Deltoid IMlbflu

## 2010-06-12 NOTE — Assessment & Plan Note (Signed)
Summary: s/p cath/ 01-17-10/saf   Referring Provider:  Dr. Posey Rea Primary Provider:  Georgina Quint Plotnikov MD  CC:  check up.Nathan KitchenMarland Kitchenpt has not started the wellbutrin.  History of Present Illness: 50 yo with history of HTN, diet-controlled diabetes, obesity, and smoking presented initially for evaluation of chest pain.  He has had some chest pain for a long period of time.  It used to occur about once a month and was related to stress.  He feels a tightness in his central chest.  He had a Lexiscan myoview in 12/10 showing EF 46% and possible old inferior infarction.  ECG had nonspecific T wave changes but no Qs.  Over the summer, chest pain has became more frequent.  It tends to occur 2-3 times a week.  It is associated with diaphoresis and lightheadedness.  No palpitations.  Pain will last 5-10 minutes then resolve.  It continues to be related mostly to stress.  No relation to exertion (though he is not very active).   Given the abnormal myoview and continuing symptoms, I decided to take him for left heart cath.  Given his body habitus, he would not have been a good candidate for coronary CT angiogram.  Left heart cath showed a 30-40% mid RCA stenosis and a 30% mid circumflex stenosis but no obstructive disease.  He still gets occasional chest pain and is very worried about his wife, who has stage IV lung cancer.  He has not yet picked up Wellbutrin and is still smoking.   Labs (1/11): K 4.4, creatinine 0.8, HDL 43, LDL 80, TSH normal, LFTs normal, hgbA1c 6.7%  Current Medications (verified): 1)  Alprazolam 2 Mg  Tabs (Alprazolam) .... Tid As Needed 2)  Aspirin 81 Mg  Tbec (Aspirin) .... One By Mouth Every Day 3)  Freestyle Lite Test  Strp (Glucose Blood) .... Once Daily Prn 4)  Vitamin D3 1000 Unit  Tabs (Cholecalciferol) .[name].. 1 By Mouth Daily 5)  Naproxen 500 Mg Tabs (Naproxen) .[name].. 1 By Mouth Two Times A Day Pc For Pain/arthritis 6)  Tramadol Hcl 50 Mg Tabs (Tramadol Hcl) .[name].. 1-2 Tabs By Mouth Two  Times A Day As Needed Pain 7)  Nitrostat 0.4 Mg Subl (Nitroglycerin) .... One Under Your Tongue As Needed For Chest Pain 8)  Lisinopril 10 Mg Tabs (Lisinopril) .... One Daily  Allergies: No Known Drug Allergies  Past History:  Past Medical History: 1. Anxiety 2. 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  Lexiscan myoview (12/10) with EF 46%, diffuse hypokinesis, possible old inferior infarction.  Echo (8/11): EF 55%, basal inferior hypokinesis, mild LVH, no significant valve abnormalities.  Left heart cath (9/11): 30-40% mid RCA, 30% mid CFX, no obstructive disease.  Medical management for mild CAD.  3. GERD 4. H/o cocaine abuse (none now) 5. OSA on CPAP      - PSG September 10 2007, AHI 119 6. Obesity 7. Tobacco abuse 8. Low back pain 9. Hyperlipidemia: untreated. 10.  DM II: Borderline, diet-controlled.   Family History: Reviewed history from 01/11/2010 and no changes required. Grandfather died of MI at 92, Father had MI at 45, uncle with atrial fibrillation, siblings do not have significant coronary disease.    Social History: Reviewed history from 01/11/2010 and no changes required. Occupation: car Conservation officer, nature in [employer] Married, two kids Current Smoker 2 ppd Alcohol use-yes 12 beers a week Prior cocaine abuse (quit years ago)  Vital Signs:  Patient profile:   50 year old male Height:      72 inches Weight:      321 pounds BMI:     43.69 Pulse rate:   94 / minute Resp:     16 per minute BP sitting:   138 / 90  (left arm)  Vitals Entered By: Kem Parkinson (February 08, 2010 4:13 PM)  Physical Exam  General:  Well developed, well nourished, in no acute distress.  Obese.  Neck:  Neck thick, no JVD. No masses, thyromegaly or abnormal cervical nodes. Lungs:  Clear bilaterally to auscultation and percussion. Heart:  Chest non-tender; regular rate and rhythm, S1, S2 without murmurs, rubs or gallops. Carotid  upstroke normal, no bruit.  Pedals normal pulses. 1+ ankle edema. Abdomen:  Bowel sounds positive; abdomen soft and non-tender without masses, organomegaly, or hernias noted. No hepatosplenomegaly. Extremities:  No clubbing or cyanosis. Neurologic:  Alert and oriented x 3. Psych:  Normal affect.   Impression & Recommendations:  Problem # 1:  CHEST PAIN (ICD-786.50) Atypical chest pain was likely noncardiac.  Patient did have a 30-40% stenosis in his RCA (nonobstructive) on cath.  Will plan medical management of CAD and risk factors.  He should continue ASA 81 mg daily and continue lisniopril for BP control.  I will have him get fasting lipids.  Given known CAD (though mild), would aim for a low LDL with low threshold for statin.   Problem # 2:  HYPERTENSION, UNSPECIFIED (ICD-401.9) BP ok on lisinopril.   Problem # 3:  SMOKING Strongly advised him to quit.  He will pick up Wellbutrin.   Patient Instructions: 1)  Your physician has recommended you make the following change in your medication:  2)  Take Zyban 150mg  daily for 3 days then one twice a day. You can use this for 12 weeks. This is to help  you stop smoking. 3)  Your physician recommends that you return for a FASTING lipid profile/liver profile/BMP  786.50 4)  Your physician recommends that you schedule a follow-up appointment as needed with Dr Shirlee Latch. Prescriptions: ZYBAN 150 MG XR12H-TAB (BUPROPION HCL (SMOKING DETER)) one daily for 3 days then one twice a day  #60 x 2   Entered by:   Katina Dung, RN, BSN   Authorized by:   Marca Ancona, MD   Signed by:   Katina Dung, RN, BSN on 02/08/2010   Method used:   Electronically to        [phone]* (retail)       [address]       [place], [place]  [address]       Ph: [phone]       Fax: [phone]   RxID:   [id number]

## 2010-06-12 NOTE — Miscellaneous (Signed)
Summary: auto BPAP download 03/13/09 to 08/20/09  Clinical Lists Changes min EPAP 5 and max IPAP 25.  With 95th percentile pressure 12 cm average AHI 31.  Used on 153 of 161 days with average 4hrs 31 min.  Patient not seen in pulmonary office since Feb 16, 2009.  Will have my nurse call to schedule next available ROV.  Appended Document: auto BPAP download 03/13/09 to 08/20/09 The patient refused to make a f/u appt with VS and said that he is sleeping all night every night on BIPAP. Will forward to VS so he is aware.  Appended Document: auto BPAP download 03/13/09 to 08/20/09 Please advise the patient that I will not be able to re-certify his need for continued use of BPAP unless he has a follow up appointment.  He could check to see if his primary care provider would agree to assume this responsibility if he does not wish to follow up with pulmonary any further.  Appended Document: auto BPAP download 03/13/09 to 08/20/09 LMOMTCB.  Appended Document: auto BPAP download 03/13/09 to 08/20/09 The patient is aware  and says he will call if he has any problems with his BIPAP and asked that we not call him anymore.

## 2010-06-12 NOTE — Letter (Signed)
Summary: Cardiac Catheterization Instructions- [facility]  [facility], Main Office  [address]   [place], [place] [address]   Phone: [phone]  Fax: [phone]     01/11/2010 MRN: [id number]  [name] [address] [place], [place]  [address]  Dear [name], [name]   You are scheduled for a Cardiac Catheterization on Wednesday September 7,2011 with Dr. Marca Ancona.  Please arrive to the 1st floor of the Heart and Vascular Center at [facility] at 6:30 am on the day of your procedure. Please do not arrive before 6:30 a.m. Call the Heart and Vascular Center at [phone] if you are unable to make your appointmnet. The Code to get into the parking garage under the building is 0020. Take the elevators to the 1st floor. You must have someone to drive you home. Someone must be with you for the first 24 hours after you arrive home. Please wear clothes that are easy to get on and off and wear slip-on shoes. Do not eat or drink after midnight except water with your medications that morning. Bring all your medications and current insurance cards with you.    _x__ Make sure you take your aspirin.  __x_ You may take ALL of your medications with water that morning. ________________________________________________________________________________________________________________________________   The usual length of stay after your procedure is 2 to 3 hours. This can vary.  If you have any questions, please call the office at the number listed above.   Katina Dung, RN, BSN

## 2010-06-12 NOTE — Progress Notes (Signed)
  Phone Note Other Incoming   Summary of Call: Patient's chart has been requested from [facility] on 06/12/09. Forwarded to [facility] 06/12/09.

## 2010-06-12 NOTE — Assessment & Plan Note (Signed)
Summary: 3 mos f/u // #? cd   Vital Signs:  Patient profile:   50 year old male Height:      72 inches Weight:      318 pounds BMI:     43.28 O2 Sat:      97 % on Room air Temp:     98.1 degrees F oral Pulse rate:   96 / minute Pulse rhythm:   regular Resp:     16 per minute BP sitting:   130 / 80  (left arm) Cuff size:   large  Vitals Entered By: Lanier Prude, CMA(AAMA) (December 20, 2009 7:50 AM)  O2 Flow:  Room air CC: 3 mo f/u Is Patient Diabetic? No Comments pt is not using test strips for glucometer   Primary Care Provider:  Georgina Quint Plotnikov MD  CC:  3 mo f/u.  History of Present Illness: The patient presents for a follow up of back pain, anxiety, depression. C/o CPs   Current Medications (verified): 1)  Alprazolam 2 Mg  Tabs (Alprazolam) .... Tid As Needed 2)  Aspirin 81 Mg  Tbec (Aspirin) .... One By Mouth Every Day 3)  Freestyle Lite Test  Strp (Glucose Blood) .... Once Daily Prn 4)  Vitamin D3 1000 Unit  Tabs (Cholecalciferol) .[name].. 1 By Mouth Daily 5)  Naproxen 500 Mg Tabs (Naproxen) .[name].. 1 By Mouth Two Times A Day Pc For Pain/arthritis 6)  Tramadol Hcl 50 Mg Tabs (Tramadol Hcl) .[name].. 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    Physical Exam  General:  alert and overweight-appearing, NAD Ears:  External ear exam shows no  significant lesions or deformities.  Otoscopic examination reveals clear canals, tympanic membranes are intact bilaterally without bulging, retraction, inflammation or discharge. Hearing is grossly normal bilaterally. Nose:  no external deformity and no nasal discharge.   Mouth:  no gingival abnormalities and pharynx pink and moist.   Neck:  supple and no masses.   Lungs:  CTA Heart:  RRR Abdomen:  Obese, soft and non-tender.   Msk:  Lumbar-sacral spine is tender to palpation over paraspinal muscles and painfull with the ROM  Neurologic:  alert & oriented X3.   Skin:  Clear Psych:  Oriented X3.     Impression & Recommendations:  Problem # 1:  CHEST PAIN (ICD-786.50) pos MSK, chronic and relapsing x years Assessment Comment Only CL was OK 12/10  Problem # 2:  LOW BACK PAIN (ICD-724.2) Assessment: Unchanged  His updated medication list for this problem includes:    Aspirin 81 Mg Tbec (Aspirin) ..... One by mouth every day    Naproxen 500 Mg Tabs (Naproxen) .[name]... 1 by mouth two times a day pc for pain/arthritis    Tramadol Hcl 50 Mg Tabs (Tramadol hcl) .[name]... 1-2 tabs by mouth two times a day as needed pain  Problem #  3:  MORBID OBESITY (ICD-278.01) Assessment: Unchanged Diet discussed  Problem # 4:  ANXIETY (ICD-300.00) Assessment: Comment Only  His updated medication list for this problem includes:    Alprazolam 2 Mg Tabs (Alprazolam) .[name]... Tid as needed  Problem # 5:  CORONARY ARTERY DISEASE (ICD-414.00) Assessment: Comment Only  His updated medication list for this problem includes:    Aspirin 81 Mg Tbec (Aspirin) ..... One by mouth every day  Complete Medication List: 1)  Alprazolam 2 Mg Tabs (Alprazolam) .... Tid as needed 2)  Aspirin 81 Mg Tbec (Aspirin) .... One by mouth every day 3)  Freestyle Lite Test Strp (Glucose blood) .... Once daily prn 4)  Vitamin D3 1000 Unit Tabs (Cholecalciferol) .[name].. 1 by mouth daily 5)  Naproxen 500 Mg Tabs (Naproxen) .[name].. 1 by mouth  two times a day pc for pain/arthritis 6)  Tramadol Hcl 50 Mg Tabs (Tramadol hcl) .[name].. 1-2 tabs by mouth two times a day as needed pain  Other Orders: Home Health Referral [facility])  Patient Instructions: 1)  Please schedule a follow-up appointment in 3 months. Prescriptions: ALPRAZOLAM 2 MG  TABS (ALPRAZOLAM) tid as needed  #90 x 2   Entered and Authorized by:   Tresa Garter MD   Signed by:   Tresa Garter MD on 12/20/2009   Method used:   Print then Give to Patient   RxID:   [id number]

## 2010-06-12 NOTE — Assessment & Plan Note (Signed)
Summary: 3 MO ROV /NWS  #   Vital Signs:  Patient profile:   50 year old male Height:      72 inches Weight:      319 pounds BMI:     43.42 Temp:     98.0 degrees F oral Pulse rate:   72 / minute Pulse rhythm:   regular Resp:     16 per minute BP sitting:   110 / 68  (left arm) Cuff size:   large  Vitals Entered By: Lanier Prude, CMA(AAMA) (March 23, 2010 7:54 AM) CC: 3 mo f/u Is Patient Diabetic? No   Primary Care Provider:  Tresa Garter MD  CC:  3 mo f/u.  History of Present Illness: The patient presents for a follow up of back pain, anxiety, depression and headaches.   Current Medications (verified): 1)  Alprazolam 2 Mg  Tabs (Alprazolam) .... Tid As Needed 2)  Aspirin 81 Mg  Tbec (Aspirin) .... One By Mouth Every Day 3)  Freestyle Lite Test  Strp (Glucose Blood) .... Once Daily Prn 4)  Vitamin D3 1000 Unit  Tabs (Cholecalciferol) .[name].. 1 By Mouth Daily 5)  Naproxen 500 Mg Tabs (Naproxen) .[name].. 1 By Mouth Two Times A Day Pc For Pain/arthritis 6)  Tramadol Hcl 50 Mg Tabs (Tramadol Hcl) .[name].. 1-2 Tabs By Mouth Two Times A Day As Needed Pain 7)  Nitrostat 0.4 Mg Subl (Nitroglycerin) .... One Under Your Tongue As Needed For Chest Pain 8)  Lisinopril 10 Mg Tabs (Lisinopril) .... One Daily 9)  Zyban 150 Mg Xr12h-Tab (Bupropion Hcl (Smoking Deter)) .... One Daily For 3 Days Then One Twice A Day  Allergies (verified): No Known Drug Allergies  Past History:  Past Medical History: Last updated: 02/08/2010 1. Anxiety 2. 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  Lexiscan myoview (12/10) with EF 46%, diffuse hypokinesis, possible old inferior infarction.  Echo (8/11): EF 55%, basal inferior hypokinesis, mild LVH, no significant valve abnormalities.  Left heart cath (9/11): 30-40% mid RCA, 30% mid CFX, no obstructive disease.  Medical management for mild CAD.  3. GERD 4. H/o cocaine abuse (none now) 5. OSA on  CPAP      - PSG September 10 2007, AHI 119 6. Obesity 7. Tobacco abuse 8. Low back pain 9. Hyperlipidemia: untreated. 10.  DM II: Borderline, diet-controlled.   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    Physical Exam  General:  alert and overweight-appearing, NAD Nose:  no external deformity and no nasal discharge.   Mouth:  no gingival abnormalities and pharynx pink and moist.   Lungs:  CTA Heart:  RRR Msk:  Lumbar-sacral spine is tender to palpation over paraspinal muscles and painfull with the ROM  Neurologic:  alert & oriented X3.   Skin:  Clear Psych:  Oriented X3.     Impression & Recommendations:  Problem # 1:  HYPERTENSION, UNSPECIFIED (ICD-401.9) Assessment Improved  His updated medication list for this problem includes:    Lisinopril 10 Mg Tabs (Lisinopril) ..... One daily  Orders: TLB-A1C / Hgb A1C (Glycohemoglobin) (83036-A1C) TLB-BMP (Basic Metabolic Panel-BMET) (80048-METABOL) TLB-TSH (Thyroid Stimulating Hormone) (84443-TSH) TLB-Hepatic/Liver Function Pnl (80076-HEPATIC) TLB-Lipid Panel (80061-LIPID)  Problem # 2:  LOW BACK PAIN (ICD-724.2) Assessment: Unchanged  His updated medication list for this problem includes:    Aspirin 81 Mg Tbec (Aspirin) ..... One by mouth every day    Naproxen 500 Mg Tabs (Naproxen) .[name]... 1 by mouth two times a day pc for pain/arthritis    Tramadol Hcl 50 Mg Tabs (Tramadol hcl) .[name]... 1-2 tabs by mouth two times a day as needed pain  Problem # 3:  MORBID OBESITY (ICD-278.01) Assessment: Unchanged  Discussed  Orders: TLB-A1C / Hgb A1C (Glycohemoglobin) (83036-A1C) TLB-BMP (Basic Metabolic Panel-BMET) (80048-METABOL) TLB-TSH (Thyroid Stimulating  Hormone) (84443-TSH) TLB-Hepatic/Liver Function Pnl (80076-HEPATIC) TLB-Lipid Panel (80061-LIPID)  Problem # 4:  TOBACCO USE DISORDER/SMOKER-SMOKING CESSATION DISCUSSED (ICD-305.1) Assessment: Unchanged  His updated medication list for this problem includes:    Zyban 150 Mg Xr12h-tab (Bupropion hcl (smoking deter)) ..... One daily for 3 days then one twice a day - not taking  Problem # 5:  CORONARY ARTERY DISEASE (ICD-414.00) Assessment: Unchanged Card records reviewed His updated medication list for this problem includes:    Aspirin 81 Mg Tbec (Aspirin) ..... One by mouth every day    Nitrostat 0.4 Mg Subl (Nitroglycerin) ..... One under your tongue as needed for chest pain    Lisinopril 10 Mg Tabs (Lisinopril) ..... One daily  Complete Medication List: 1)  Alprazolam 2 Mg Tabs (Alprazolam) .... Tid as needed 2)  Aspirin 81 Mg Tbec (Aspirin) .... One by mouth every day 3)  Freestyle Lite Test Strp (Glucose blood) .... Once daily prn 4)  Vitamin D3 1000 Unit Tabs (Cholecalciferol) .[name].. 1 by mouth daily 5)  Naproxen 500 Mg Tabs (Naproxen) .[name].. 1 by mouth two times a day pc for pain/arthritis 6)  Tramadol Hcl 50 Mg Tabs (Tramadol hcl) .[name].. 1-2 tabs by mouth two times a day as needed pain 7)  Nitrostat 0.4 Mg Subl (Nitroglycerin) .... One under your tongue as needed for chest pain 8)  Lisinopril 10 Mg Tabs (Lisinopril) .... One daily 9)  Zyban 150 Mg Xr12h-tab (Bupropion hcl (smoking deter)) .... One daily for 3 days then one twice a day  Other Orders: Admin 1st Vaccine (45409) Flu Vaccine 50yrs + (81191)  Patient Instructions: 1)  Please schedule a follow-up appointment in 3 months. Prescriptions: LISINOPRIL 10 MG TABS (LISINOPRIL) one daily  #30 x 6   Entered and Authorized by:   Tresa Garter MD   Signed by:   Tresa Garter MD on 03/23/2010   Method used:   Print then Give to Patient   RxID:   [id number] TRAMADOL HCL 50 MG TABS (TRAMADOL HCL) 1-2 tabs by  mouth two times a day as needed pain  #120 x 3   Entered and Authorized by:   Tresa Garter MD   Signed by:   Tresa Garter MD on 03/23/2010   Method used:   Print then Give to Patient   RxID:   [id number] NAPROXEN 500 MG TABS (NAPROXEN) 1 by mouth two times a day pc for pain/arthritis  #60 Tablet x 2   Entered and Authorized by:   Tresa Garter MD   Signed by:   Tresa Garter MD on 03/23/2010   Method used:   Print then Give to Patient   RxID:   [id number] FREESTYLE LITE TEST  STRP (GLUCOSE BLOOD) once daily prn  #50 x 6   Entered and Authorized by:   Tresa Garter MD   Signed by:   Macarthur Critchley  V Plotnikov MD on 03/23/2010   Method used:   Print then Give to Patient   RxID:   [id number] ALPRAZOLAM 2 MG  TABS (ALPRAZOLAM) tid as needed  #90 x 2   Entered and Authorized by:   Tresa Garter MD   Signed by:   Tresa Garter MD on 03/23/2010   Method used:   Print then Give to Patient   RxID:   [id number]    Orders Added: 1)  Est. Patient Level IV [24401] 2)  TLB-A1C / Hgb A1C (Glycohemoglobin) [83036-A1C] 3)  TLB-BMP (Basic Metabolic Panel-BMET) [80048-METABOL] 4)  TLB-TSH (Thyroid Stimulating Hormone) [84443-TSH] 5)  TLB-Hepatic/Liver Function Pnl [80076-HEPATIC] 6)  TLB-Lipid Panel [80061-LIPID] 7)  Admin 1st Vaccine [90471] 8)  Flu Vaccine 57yrs + [02725]    Influenza Vaccine (to be given today) Flu Vaccine Consent Questions     Do you have a history of severe allergic reactions to this vaccine? no    Any prior history of allergic reactions to egg and/or gelatin? no    Do you have a sensitivity to the preservative Thimersol? no    Do you have a past history of Guillan-Barre Syndrome? no    Do you currently have an acute febrile illness? no    Have you ever had a severe reaction to latex? no    Vaccine information given and explained to patient? yes    Are you currently pregnant? no    Lot Number:[id number]   Exp  Date:11/10/2010   Site Given  Left Deltoid IM Lanier Prude, [facility])  March 23, 2010 8:14 AM

## 2010-06-14 NOTE — Progress Notes (Signed)
Summary: Rf Alprazolam  Phone Note Refill Request Message from:  Fax from Pharmacy  Refills Requested: Medication #1:  ALPRAZOLAM 2 MG  TABS tid as needed   Dosage confirmed as above?Dosage Confirmed   Supply Requested: 90   Last Refilled: 04/17/2010  Method Requested: Telephone to Pharmacy Next Appointment Scheduled: 06-26-10 Initial call taken by: Lanier Prude, [facility]),  May 16, 2010 8:44 AM  Follow-up for Phone Call        ok to ref Follow-up by: Tresa Garter MD,  May 16, 2010 5:29 PM    Prescriptions: ALPRAZOLAM 2 MG  TABS (ALPRAZOLAM) tid as needed  #90 x 2   Entered by:   Lamar Sprinkles, CMA   Authorized by:   Tresa Garter MD   Signed by:   Lamar Sprinkles, CMA on 05/17/2010   Method used:   Telephoned to ...       [facility] [phone]* (retail)       [address]       [place], [place]  [address]       Ph: [phone]       Fax: [phone]   RxID:   [id number]

## 2010-06-26 ENCOUNTER — Ambulatory Visit: Payer: Self-pay | Admitting: Internal Medicine

## 2010-07-01 ENCOUNTER — Encounter: Payer: Self-pay | Admitting: Internal Medicine

## 2010-07-03 ENCOUNTER — Ambulatory Visit (INDEPENDENT_AMBULATORY_CARE_PROVIDER_SITE_OTHER): Payer: BC Managed Care – PPO | Admitting: Internal Medicine

## 2010-07-03 ENCOUNTER — Encounter: Payer: Self-pay | Admitting: Internal Medicine

## 2010-07-03 DIAGNOSIS — H6120 Impacted cerumen, unspecified ear: Secondary | ICD-10-CM | POA: Insufficient documentation

## 2010-07-03 DIAGNOSIS — F4321 Adjustment disorder with depressed mood: Secondary | ICD-10-CM | POA: Insufficient documentation

## 2010-07-03 DIAGNOSIS — R7309 Other abnormal glucose: Secondary | ICD-10-CM

## 2010-07-03 DIAGNOSIS — I10 Essential (primary) hypertension: Secondary | ICD-10-CM

## 2010-07-03 HISTORY — DX: Adjustment disorder with depressed mood: F43.21

## 2010-07-10 NOTE — Assessment & Plan Note (Signed)
Summary: follow up   Vital Signs:  Patient profile:   50 year old male Height:      72 inches Weight:      312 pounds BMI:     42.47 Temp:     98.5 degrees F oral Pulse rate:   80 / minute Pulse rhythm:   regular Resp:     16 per minute BP sitting:   112 / 80  (left arm) Cuff size:   large  Vitals Entered By: Lanier Prude, CMA(AAMA) (July 03, 2010 8:23 AM) CC: f/u Is Patient Diabetic? No   Primary Care Provider:  Tresa Garter MD  CC:  f/u.  History of Present Illness: The patient presents for a follow up of back pain, anxiety, obesity. He is grieving.  Current Medications (verified): 1)  Alprazolam 2 Mg  Tabs (Alprazolam) .... Tid As Needed 2)  Aspirin 81 Mg  Tbec (Aspirin) .... One By Mouth Every Day 3)  Freestyle Lite Test  Strp (Glucose Blood) .... Once Daily Prn 4)  Vitamin D3 1000 Unit  Tabs (Cholecalciferol) .[name].. 1 By Mouth Daily 5)  Naproxen 500 Mg Tabs (Naproxen) .[name].. 1 By Mouth Two Times A Day Pc For Pain/arthritis 6)  Tramadol Hcl 50 Mg Tabs (Tramadol Hcl) .[name].. 1-2 Tabs By Mouth Two Times A Day As Needed Pain 7)  Nitrostat 0.4 Mg Subl (Nitroglycerin) .... One Under Your Tongue As Needed For Chest Pain 8)  Lisinopril 10 Mg Tabs (Lisinopril) .... One Daily 9)  Zyban 150 Mg Xr12h-Tab (Bupropion Hcl (Smoking Deter)) .... One Daily For 3 Days Then One Twice A Day  Allergies (verified): No Known Drug Allergies  Past History:  Past Medical History: Last updated: 02/08/2010 1. Anxiety 2. 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  Lexiscan myoview (12/10) with EF 46%, diffuse hypokinesis, possible old inferior infarction.  Echo (8/11): EF 55%, basal inferior hypokinesis, mild LVH, no significant valve abnormalities.  Left heart cath (9/11): 30-40% mid RCA, 30% mid CFX, no obstructive disease.  Medical management for mild CAD.  3. GERD 4. H/o cocaine abuse (none now) 5. OSA on CPAP      - PSG  September 10 2007, AHI 119 6. Obesity 7. Tobacco abuse 8. Low back pain 9. Hyperlipidemia: untreated. 10.  DM II: Borderline, diet-controlled.   Past Surgical History: Last updated: 09/03/2007 Right leg francture Left arm fracture Back surgery x two  Social History: Last updated: 07/03/2010 Occupation: car Airline pilot Lives in [place]  two kids Current Smoker 1.5  ppd Alcohol use-yes 12 beers a week Prior cocaine abuse (quit years ago) Widow/Widower 2012  Social History: Occupation: Retail banker Lives in [place]  two kids Current Smoker 1.5  ppd Alcohol use-yes 12 beers a week Prior cocaine abuse (quit years ago) Widow/Widower 2012  Review of Systems  The patient denies fever, dyspnea on exertion, abdominal pain, melena, hematuria, muscle weakness, and depression.         Lost wt on diet  Physical Exam  General:  alert and overweight-appearing, NAD Ears:  Wax in L. Hearing is grossly normal bilaterally. Nose:  no external deformity and no nasal discharge.   Mouth:  no gingival abnormalities and pharynx pink and moist.   Neck:  supple and no masses.   Lungs:  CTA Heart:  RRR Abdomen:  Obese, soft and non-tender.   Msk:  Lumbar-sacral spine is tender to palpation over paraspinal muscles and  painfull with the ROM  Neurologic:  alert & oriented X3.   Skin:  Clear Psych:  Oriented X3.  Sad.normally interactive, good eye contact, not anxious appearing, not depressed appearing, and not suicidal.     Impression & Recommendations:  Problem # 1:  HYPERTENSION, UNSPECIFIED (ICD-401.9) Assessment Improved  His updated medication list for this problem includes:    Lisinopril 10 Mg Tabs (Lisinopril) ..... One daily  Problem # 2:  LOW BACK PAIN (ICD-724.2) Assessment: Unchanged  His updated medication list for this problem includes:    Aspirin 81 Mg Tbec (Aspirin) ..... One by mouth every day    Naproxen 500 Mg Tabs (Naproxen) .[name]... 1 by mouth two times a day pc for  pain/arthritis    Tramadol Hcl 50 Mg Tabs (Tramadol hcl) .[name]... 1-2 tabs by mouth two times a day as needed pain  Problem # 3:  MORBID OBESITY (ICD-278.01) Assessment: Improved Discussed  Problem # 4:  CORONARY ARTERY DISEASE (ICD-414.00) Assessment: Unchanged S/p Card eval. His updated medication list for this problem includes:    Aspirin 81 Mg Tbec (Aspirin) ..... One by mouth every day    Nitrostat 0.4 Mg Subl (Nitroglycerin) ..... One under your tongue as needed for chest pain    Lisinopril 10 Mg Tabs (Lisinopril) ..... One daily  Problem # 5:  GRIEF REACTION (ICD-309.0) Assessment: New Discussed  Problem # 6:  CERUMEN IMPACTION (ICD-380.4) L Assessment: New Irrigate at home  Problem # 7:  OBSTRUCTIVE SLEEP APNEA (ICD-327.23) Assessment: Improved on CPAP  Problem # 8:  TOBACCO USE DISORDER/SMOKER-SMOKING CESSATION DISCUSSED (ICD-305.1) Assessment: Unchanged  His updated medication list for this problem includes:    Zyban 150 Mg Xr12h-tab (Bupropion hcl (smoking deter)) ..... One daily for 3 days then one twice a day NOT USING  Complete Medication List: 1)  Alprazolam 2 Mg Tabs (Alprazolam) .... Tid as needed 2)  Aspirin 81 Mg Tbec (Aspirin) .... One by mouth every day 3)  Freestyle Lite Test Strp (Glucose blood) .... Once daily prn 4)  Vitamin D3 1000 Unit Tabs (Cholecalciferol) .[name].. 1 by mouth daily 5)  Naproxen 500 Mg Tabs (Naproxen) .[name].. 1 by mouth two times a day pc for pain/arthritis 6)  Tramadol Hcl 50 Mg Tabs (Tramadol hcl) .[name].. 1-2 tabs by mouth two times a day as needed pain 7)  Nitrostat 0.4 Mg Subl (Nitroglycerin) .... One under your tongue as needed for chest pain 8)  Lisinopril 10 Mg Tabs (Lisinopril) .... One daily 9)  Zyban 150 Mg Xr12h-tab (Bupropion hcl (smoking deter)) .... One daily for 3 days then one twice a day  Patient Instructions: 1)  Please schedule a follow-up appointment in 4 months. 2)  BMP prior to visit, ICD-9: 3)  Hepatic Panel prior  to visit, ICD-9: 4)  TSH prior to visit, ICD-9: 5)  HbgA1C prior to visit, ICD-9: 6)  790.29  995.20   Prescriptions: LISINOPRIL 10 MG TABS (LISINOPRIL) one daily  #30 x 6   Entered and Authorized by:   Tresa Garter MD   Signed by:   Tresa Garter MD on 07/03/2010   Method used:   Print then Give to Patient   RxID:   [id number] TRAMADOL HCL 50 MG TABS (TRAMADOL HCL) 1-2 tabs by mouth two times a day as needed pain  #120 x 3   Entered and Authorized by:   Tresa Garter MD   Signed by:   Tresa Garter MD on 07/03/2010   Method  used:   Print then Give to Patient   RxID:   [id number] NAPROXEN 500 MG TABS (NAPROXEN) 1 by mouth two times a day pc for pain/arthritis  #60 Tablet x 2   Entered and Authorized by:   Tresa Garter MD   Signed by:   Tresa Garter MD on 07/03/2010   Method used:   Print then Give to Patient   RxID:   [id number] ALPRAZOLAM 2 MG  TABS (ALPRAZOLAM) tid as needed  #90 x 2   Entered and Authorized by:   Tresa Garter MD   Signed by:   Tresa Garter MD on 07/03/2010   Method used:   Print then Give to Patient   RxID:   [id number]    Orders Added: 1)  Est. Patient Level IV [56433]

## 2010-07-10 NOTE — Letter (Signed)
Summary: CMN for Supplies / [facility]  CMN for Supplies / [facility]   Imported By: Lennie Odor 07/04/2010 10:41:33  _____________________________________________________________________  External Attachment:    Type:   Image     Comment:   External Document

## 2010-08-13 ENCOUNTER — Other Ambulatory Visit: Payer: Self-pay | Admitting: Cardiology

## 2010-09-25 NOTE — Assessment & Plan Note (Signed)
[name]                         GASTROENTEROLOGY OFFICE NOTE   NAME:[name], [name]                    MRN:          [id number]  DATE:09/09/2007                            DOB:          1960/09/02    REFERRING PHYSICIAN:  Georgina Quint. Plotnikov, MD   REASON FOR CONSULTATION:  Hematochezia.   HISTORY OF PRESENT ILLNESS:  [name] is a 50 year old white male,  who relates intermittent small-volume hematochezia over the past several  years.  He has occasional mild rectal discomfort, associated with bowel  movements.  He relates no change in stool caliber, change in bowel  habits, weight-loss or abdominal pain.  There is no family history of  colon cancer, colon polyps or inflammatory bowel disease.   PAST MEDICAL HISTORY:  coronary artery disease  anxiety  GERD  prior myocardial infarction  history of cocaine abuse  obesity  kidney stones  pneumonia  seizures  sleep apnea   PAST SURGICAL HISTORY:  Status post back surgery, 1985.  Status post left-arm surgery, 1989.  Status post left leg surgery, 1982.   SOCIAL HISTORY:  Per the handwritten form.   REVIEW OF SYSTEMS:  Per the handwritten form.   PHYSICAL EXAM:  Morbidly obese, white male, in no acute distress.  Height 6 feet, weight 308.4 pounds, blood pressure is 104/72, pulse 100  and regular.  HEENT EXAM:  Anicteric sclerae.  Oropharynx clear.  CHEST:  Clear to auscultation bilaterally.  CARDIAC:  Regular rate and rhythm, without murmurs appreciated.  ABDOMEN:  Large, soft, nontender, nondistended.  Normoactive bowel  sounds.  No palpable organomegaly, masses or hernias.  RECTAL EXAMINATION:  Deferred to colonoscopy.  EXTREMITIES:  Without clubbing, cyanosis or edema.  NEUROLOGIC:  Alert and oriented times three.  Grossly nonfocal.   ASSESSMENT AND PLAN:  Intermittent small-volume hematochezia with  occasional rectal discomfort.  I suspect he has internal hemorrhoids.  Need to exclude proctitis, colorectal  neoplasms and other disorders.  Schedule colonoscopy. Risks, benefits,  and alternatives to colonoscopy with possible biopsy, possible  polypectomy and possible destruction of internal hemorrhoids discussed  with the patient and he consents to proceed.     [name]. Russella Dar, MD, [facility]  Electronically Signed    MTS/MedQ  DD: 09/09/2007  DT: 09/09/2007  Job #: [id number]

## 2010-09-28 NOTE — Discharge Summary (Signed)
NAME:  [name], [name] NO.:  [id number]   MEDICAL RECORD NO.:  [id number]                   PATIENT TYPE:  INP   LOCATION:  0354                                 FACILITY:  [facility]   PHYSICIAN:  Jackie Plum, M.D.             DATE OF BIRTH:  Aug 08, 1960   DATE OF ADMISSION:  07/23/2003  DATE OF DISCHARGE:  07/25/2003                                 DISCHARGE SUMMARY   DISCHARGE DIAGNOSES:  1. Chest pain, resolved.  2. History of dyslipidemia.  3. History of myocardial infarction.  4. History of cocaine abuse 12 to 15 years ago.  5. History of ongoing cigarette smoker, 3 packs of cigarettes per day.  6. Drug abuse.   DISCHARGE MEDICATIONS:  None were prescribed to the patient since he left  [facility].   DISCHARGE LABORATORY DATA:  WBC count 7.  CBC within normal limits.  D-dimer  less than 0.22.  Basic metabolic panel within normal limits except for a  glucose of 113.  Cardiac enzymes x2 negative.  Total cholesterol 198,  triglycerides 299, HDL 38, LDL 100.   HISTORY OF PRESENT ILLNESS:  [name] is a 50 year old Caucasian  gentleman with a previous history of cocaine induced heart attack,  according to the patient.  He presented with a history of chest pain for  five days.  Pain was said to be dull located in the sternum of moderate  intensity, radiating to his left arm, and lasting about 1 minute on each  occasion.  He did not have any nausea, vomiting, or diaphoresis, but had  experienced some mild shortness of breath with these events.  There was no  known aggravating or alleviating factors.  He said the pains were similar to  the pain that he had when he had a small heart attack due to cocaine use.  He has had some cough with brown sputum which he says is chronic.  He denies  any fever or chills.   PHYSICAL EXAMINATION:  VITAL SIGNS:  On admission, the patient's blood  pressure was 152/98, pulse of 90 per minute, respirations of 20,  temperature  ___________Fahrenheit.  His oxygen saturations were 96% on room air.  GENERAL:  He was not in any distress.  LUNGS:  Clear to auscultation.  CARDIAC:  Unremarkable.  ABDOMEN:  Obese, bowel sounds were present, they were normoactive.  NEUROLOGIC:  He was alert and oriented x3.   His x-ray did not reveal any acute infiltrate.  EKG showed sinus rhythm  without any acute ST-T wave changes.  His CBC was essentially within normal  limits.  D-dimer ordered by the ED was less than 0.22.  His cardiac enzymes,  first set, were negative for MI.  His drug screen was positive for  tetrahydrocannabinol.   The patient was subsequently admitted to the hospital for rule out MI  protocol with the intent of obtaining a Cardiolite to rule out over the  weekend.   HOSPITAL COURSE:  He was admitted to a telemetry bed.  There were no  significant arrhythmias.  The patient did not have any recurrence of chest  pain while he was in the hospital.  He elected to stay in the hospital to  get a Cardiolite done on Monday during my rounds in discussion with family  yesterday.  However, this morning was seen by cardiology and apparently  stress test was delayed, and there was intention to possibly do it tomorrow  because the rule out protocol had to be completed.  However, the patient  became unhappy with this and decided to leave AMA some time this morning.  When I saw patient on morning rounds he did not have any chest pain or  shortness of breath.  His vitals were stable.  His O2 saturation was 92% on  room air.  His cardiopulmonary exam was unremarkable.  He did not have any  edema of his lower extremities.  I had discussed with him that I was going  to try to get a cardiologist to see if they could do the stress test for him  some time this afternoon, and then also I told him that I would be coming  back to see him some time today, and we would discuss possible discharge  with an outpatient  scheduled stress test for him should the stress test not  be able to be done by the cardiology team today.  I called cardiology on  call for unassigned, [facility], and I currently have come to see  the patient, and for some reason he was said to have been belligerent and  left the hospital AMA.  I told him initially to stay in the hospital and  wait for the decision to do a stress test to be sorted out before leaving  with the option of scheduling for outpatient stress test, however, the  patient ______________and insisted he was leaving and left some time this  morning.                                               Jackie Plum, M.D.    GO/MEDQ  D:  07/25/2003  T:  07/25/2003  Job:  [id number]   cc:   [facility]

## 2010-09-28 NOTE — H&P (Signed)
NAME:  [name], [name] NO.:  [id number]   MEDICAL RECORD NO.:  [id number]                   PATIENT TYPE:  INP   LOCATION:  0356                                 FACILITY:  [facility]   PHYSICIAN:  Jackie Plum, M.D.             DATE OF BIRTH:  08/05/1960   DATE OF ADMISSION:  07/23/2003  DATE OF DISCHARGE:                                HISTORY & PHYSICAL   CHIEF COMPLAINT:  Chest pain for 5 days.   HISTORY OF PRESENT ILLNESS:  The patient is a 50 year old Caucasian  gentleman without any history of hypertension or diabetes. He smokes about 3  pack per day of cigarettes. Presenting with recurrent dull sternal chest  pain of moderate intensity, radiating to his left arm, and lasting about 1  minute on each occasion. This had started while he was out of town to attend  the [place] event this week. He came to the ED on account of worsening  intensity of his pain. No history of associated nausea, vomiting, or  diaphoresis but described some shortness of breath with these events. No  known relieving or aggravating factor. The patient says that he has had some  like pains before at which time he was told that he had a small heart  attack.  He gives a history of cough with brownish sputum, which is  chronic. No known history of fever, chills, ankle swelling, or calf or leg  pain. No history of vomiting, sore throat, abdominal pain, black or bloody  stools.  No dysuria but has been having some frequent urination.   PAST MEDICAL HISTORY:  He gives a history of previous small heart attack.  No history of hypertension, diabetes, or dyslipidemia.   FAMILY HISTORY:  Positive for heart disease and diabetes.   MEDICATIONS:  He is not taking any medications.   SOCIAL HISTORY:  He is married and lives with his wife. He sells cars. He  smokes about 3 packs of cigarettes a day. He uses some marijuana daily. He  used to smoke cocaine, which was said to have induced  his previous small  heart attack, but has stopped smoking some time now.   REVIEW OF SYSTEMS:  Significant positives and negatives except as in HPI  unremarkable.   PHYSICAL EXAMINATION:  VITAL SIGNS:  Blood pressure was 152/98. Pulse of 90.  Respiratory rate of 20. Temperature 97.9 degrees Fahrenheit. O2 saturation  of 96% on room air.  He was in acute cardiopulmonary distress, and pain had  resolved at the time of evaluation.  HEENT:  Normocephalic, atraumatic. Pupils are equal, round, and reactive to  light. Extraocular movements were intact. Oropharynx was clear.  NECK:  Supple. No jugular venous distention. No carotid bruit.  CHEST:  Clear to auscultation. I could not appreciate rhonchi which was  appreciated by emergency department physician during the examination.  CARDIAC:  Regular rate and rhythm. Without any gallops or murmur.  ABDOMEN:  Obese, soft, nontender. Bowel sounds were present and were normal  active.  EXTREMITIES:  Notable for absence of any clubbing. Dorsalis pedis pulses  were present and were about 2+ bilaterally. No edema.  NEUROLOGIC:  Alert and oriented x3. No acute focal deficits.   LABORATORY DATA:  Electrocardiogram showed normal sinus rhythm at 97 per  minute. No acute ST wave changes.  Chest x-ray has been ordered by me and is  pending at this time.  Previous chest x-rays showed marked chronic  bronchitic changes and chronic interstitial lung disease compatible with  smoking.   CBC was essentially within normal limits. D-dimer was done in the ED and was  less than 0.22. Sodium was 139, potassium 3.7, chloride of 108, CO2 of 26,  glucose 113, BUN 13, creatinine 0.9.  LFTs were within normal limits. First  set of cardiac enzymes, CPK of 183, CK-MB of 2.8, with an index of 1.5,  troponin I of 0.04, BNP of less than 30.0.  Urine drug screen was positive  for tetra hydro cannabinoids.   IMPRESSION:  1. Chest pain, rule out myocardial infarction, rule out  recurrent ischemia.  2. Obesity.  3. Drug abuse.   PLAN:  He will be admitted to telemetry bed. Rule out myocardial infarction  by serial cardiac enzymes. We will start him on aspirin.  Blood pressures  seem to be elevated.  Will monitor that and treat him for that appropriately  should it be persistent. The patient has been counseled about drug abuse and  its effect on his general health, and he will get continued counseling and  reinforcement during hospitalization and on discharge.                                               Jackie Plum, M.D.    GO/MEDQ  D:  07/23/2003  T:  07/23/2003  Job:  [id number]

## 2010-09-28 NOTE — Assessment & Plan Note (Signed)
[facility]                           PRIMARY CARE OFFICE NOTE   NAME:[name], [name]                    MRN:          [id number]  DATE:05/16/2006                            DOB:          01/17/1961    The patient is a 50 year old male, who presents for wellness  examination.   PAST MEDICAL HISTORY FAMILY HISTORY SOCIAL HISTORY:  As per December 13, 2004 note.  He continues to smoke two and a half packs of cigarettes per  day.   CURRENT MEDICINES:  Reviewed with the patient on the chart.   ALLERGIES:  None.   REVIEW OF SYSTEMS:  Faint discomfort in the left testicle.  Lost weight  on diet.  Denies chest pain or shortness of breath.  Has been having  cough lately with green sputum production, occasional blood in the  stool.  Using C-PAP for sleep apnea.  The rest of the 18-point system  review is negative.   PHYSICAL EXAMINATION:  He is in no acute distress.  Blood pressure  143/94, pulse 98, temperature 97.7, weight is 279 pounds.  HEENT:  Moist mucosa.  NECK:  Supple.  No thyromegaly or bruit.  LUNGS:  Clear.  No wheezes or rales.  HEART:  S1, S2, no murmur, no gallop.  ABDOMEN:  Soft, nontender, no hepatomegaly or mass felt.  EXTREMITIES:  Lower extremities without edema.  He is alert, oriented and cooperative.  Denies being depressed.  SKIN:  With multiple tattoos.  Testicles without swelling.  Left slightly tender to palpation.  Small  amount of varicose veins is palpable.  RECTAL:  Revealed normal prostate.  Stool guaiac-negative.   ASSESSMENT AND PLAN:  1. Normal wellness examination.  Age/health related issues discussed.      Healthy lifestyle discussed.  He needs to stop smoking, continue      with weight-loss, obtain lab work appropriate for age.  2. Sleep apnea.  Continue with his C-PAP.  3. Hematochezia with likely internal hemorrhoids.  Given Proctocort      suppositories to use b.i.d. for ten days.  Gastroenterology  consultation with Dr. Victorino Dike.  4. Acute bronchitis.  Doxycycline 100 mg p.o. b.i.d. for ten days.      Albuterol 4 mg twice daily      p.r.n. wheezing.  5. Left testicular pain.  We will schedule urology consult, Dr. Annabell Howells.     Georgina Quint. Plotnikov, MD  Electronically Signed    AVP/MedQ  DD: 05/16/2006  DT: 05/16/2006  Job #: [id number]   cc:   Excell Seltzer. Annabell Howells, M.D.  Ulyess Mort, MD

## 2010-09-28 NOTE — Procedures (Signed)
NAME:  [name], [name] NO.:  [id number]   MEDICAL RECORD NO.:  [id number]          PATIENT TYPE:  OUT   LOCATION:  SLEEP CENTER                 FACILITY:  [facility]   PHYSICIAN:  Coralyn Helling, MD        DATE OF BIRTH:  06-14-1960   DATE OF STUDY:  09/10/2007                            NOCTURNAL POLYSOMNOGRAM   REFERRING PHYSICIAN:  Coralyn Helling, MD   INDICATION FOR STUDY:  [name] is a 50 year old male who has a  previous diagnosis of sleep apnea.  He has had a significant increase in  his weight.  He has not been on therapy for his sleep apnea.  He is  referred to the sleep lab for further evaluation of his obstructive  sleep apnea.   Height is 6 feet tall, weight is 320 pounds.  BMP is 43.  Neck size is  21 inches.   EPWORTH SLEEPINESS SCORE:  14.   MEDICATIONS:  Aspirin, Xanax and vitamin D.   SLEEP ARCHITECTURE:  The patient follow a split night study protocol.  During the diagnostic portion of the test sleep period of time was 135  minutes.  Total sleep time was 126 minutes.  Sleep efficiency was 92%.  Sleep latency is 2 minutes, which is significantly reduced.  REM latency  was 112 minutes.  This portion of the study was notable for lack of slow-  wave sleep.  The patient slept predominantly in the non-supine position.   During the therapeutic portion of the test sleep period time was 337  minutes.  Total sleep time was 336 minutes.  Sleep efficiency was 99%.  Sleep latency is 1.5 minutes, which is reduced.  REM latency is 57  minutes.  This portion of the study was notable for lack of slow-wave  sleep.  The patient was observed in both the supine and non-supine  position.   RESPIRATORY DATA:  The average respiratory rate was 22.  During the  diagnostic portion of the test the overall apnea-hypopnea index was  found to be 119.  There was one central apneic event and five mixed  events.  The remainder of the events were obstructive in nature.  The  REM apnea-hypopnea index was 75.  The non-REM apnea-hypopnea index was  124.  Loud snoring was noted by the technician.   During the therapeutic porion of the test the patient was titrated from  a CPAP pressure setting of 6 to 25 cmH2O.  At a CPAP pressure setting of  21 cmH2O, the apnea-hypopnea index was reduced to 7.2.  At this pressure  setting the patient was observed in both REM sleep and supine sleep and  snoring was eliminated.   OXYGEN DATA:  The baseline oxygenation was 95%.  The oxygen saturation  nadir was 37%.  At a CPAP pressure setting of 21 cmH2O, the oxygen  saturation nadir was 87%.   CARDIAC DATA:  The average heart rate was 76 and the rhythm strip showed  normal sinus rhythm with PACs and PVCs.   MOVEMENT-PARASOMNIA:  The periodic limb movement index was 1.9.  The  patient had one rest room trip.   IMPRESSIONS-RECOMMENDATIONS:  This  was a split night study protocol.  During the diagnostic portion of the study the patient was found to have  severe obstructive sleep apnea with an apnea-hypopnea index of 119 and  an oxygen saturation nadir of 37%.  During the therapeutic portion of  the study he was titrated to a CPAP pressure setting of 21 with a  reduction in his apnea-hypopnea index to 7.2.  At this pressure setting  he was observed in both REM sleep and supine sleep.  His snoring was  eliminated.  In addition, his oxygenation was stabilized.   What I would recommend is to set the patient on CPAP at 21 cmH2O.  He  will then need to have an overnight oximetry once he is established on  CPAP.  If he has difficulty tolerating these high pressures from CPAP,  he may need to be tried on BiPAP.  In addition, the patient will need to  have further counseling with regard to importance of diet, exercise and  weight reduction.      Coralyn Helling, MD  Diplomat, American Board of Sleep Medicine  Electronically Signed     VS/MEDQ  D:  09/10/2007 15:06:02  T:   09/10/2007 15:23:14  Job:  [id number]

## 2010-10-30 ENCOUNTER — Encounter: Payer: Self-pay | Admitting: Internal Medicine

## 2010-10-30 ENCOUNTER — Other Ambulatory Visit: Payer: Self-pay | Admitting: Internal Medicine

## 2010-10-30 ENCOUNTER — Other Ambulatory Visit: Payer: BC Managed Care – PPO

## 2010-10-30 DIAGNOSIS — R7309 Other abnormal glucose: Secondary | ICD-10-CM

## 2010-10-30 DIAGNOSIS — T887XXA Unspecified adverse effect of drug or medicament, initial encounter: Secondary | ICD-10-CM

## 2010-10-31 ENCOUNTER — Encounter: Payer: Self-pay | Admitting: Internal Medicine

## 2010-11-01 ENCOUNTER — Encounter: Payer: Self-pay | Admitting: Internal Medicine

## 2010-11-01 ENCOUNTER — Ambulatory Visit (INDEPENDENT_AMBULATORY_CARE_PROVIDER_SITE_OTHER): Payer: BC Managed Care – PPO | Admitting: Internal Medicine

## 2010-11-01 DIAGNOSIS — G4733 Obstructive sleep apnea (adult) (pediatric): Secondary | ICD-10-CM

## 2010-11-01 DIAGNOSIS — M545 Low back pain, unspecified: Secondary | ICD-10-CM

## 2010-11-01 DIAGNOSIS — I10 Essential (primary) hypertension: Secondary | ICD-10-CM

## 2010-11-01 DIAGNOSIS — F4321 Adjustment disorder with depressed mood: Secondary | ICD-10-CM

## 2010-11-01 MED ORDER — LISINOPRIL 10 MG PO TABS: 10.0000 mg | ORAL_TABLET | Freq: Every day | ORAL | Status: DC

## 2010-11-01 MED ORDER — ALPRAZOLAM 2 MG PO TABS: 2.0000 mg | ORAL_TABLET | Freq: Three times a day (TID) | ORAL | Status: DC | PRN

## 2010-11-01 MED ORDER — TRAMADOL HCL 50 MG PO TABS: 50.0000 mg | ORAL_TABLET | Freq: Two times a day (BID) | ORAL | Status: DC | PRN

## 2010-11-01 MED ORDER — NAPROXEN 500 MG PO TABS: 500.0000 mg | ORAL_TABLET | Freq: Two times a day (BID) | ORAL | Status: DC

## 2010-11-01 NOTE — Assessment & Plan Note (Signed)
On Rx 

## 2010-11-01 NOTE — Assessment & Plan Note (Signed)
Better  

## 2010-11-01 NOTE — Assessment & Plan Note (Signed)
On Tramadol 

## 2010-11-01 NOTE — Progress Notes (Signed)
  Subjective:    Patient ID: [name], male    DOB: [birth year], 50 y.o.   MRN: [id number]  HPI   The patient is here to follow up on chronic grief/depression, anxiety, OA and chronic moderate fibromyalgia symptoms controlled better with medicines, diet and exercise.   Review of Systems  Constitutional: Negative for appetite change, fatigue and unexpected weight change.  HENT: Negative for nosebleeds, congestion, sore throat, sneezing, trouble swallowing and neck pain.   Eyes: Negative for itching and visual disturbance.  Respiratory: Negative for cough.   Cardiovascular: Negative for chest pain, palpitations and leg swelling.  Gastrointestinal: Negative for nausea, diarrhea, blood in stool and abdominal distention.  Genitourinary: Negative for frequency and hematuria.  Musculoskeletal: Negative for back pain, joint swelling and gait problem.  Skin: Negative for rash.  Neurological: Negative for dizziness, tremors, speech difficulty and weakness.  Psychiatric/Behavioral: Negative for suicidal ideas, sleep disturbance, dysphoric mood and agitation. The patient is nervous/anxious.    Wt Readings from Last 3 Encounters:  11/01/10 301 lb (136.533 kg)  07/03/10 312 lb (141.522 kg)  03/23/10 319 lb (144.697 kg)       Objective:   Physical Exam  Constitutional: He is oriented to person, place, and time. He appears well-developed. No distress.       Obese  HENT:  Mouth/Throat: Oropharynx is clear and moist.  Eyes: Conjunctivae are normal. Pupils are equal, round, and reactive to light.  Neck: Normal range of motion. No JVD present. No thyromegaly present.  Cardiovascular: Normal rate, regular rhythm, normal heart sounds and intact distal pulses.  Exam reveals no gallop and no friction rub.   No murmur heard. Pulmonary/Chest: Effort normal and breath sounds normal. No respiratory distress. He has no wheezes. He has no rales. He exhibits no tenderness.  Abdominal: Soft.  Bowel sounds are normal. He exhibits no distension and no mass. There is no tenderness. There is no rebound and no guarding.  Musculoskeletal: Normal range of motion. He exhibits no edema and no tenderness.       LS tender w/ROM  Lymphadenopathy:    He has no cervical adenopathy.  Neurological: He is alert and oriented to person, place, and time. He has normal reflexes. No cranial nerve deficit. He exhibits normal muscle tone. Coordination normal.  Skin: Skin is warm and dry. No rash noted.  Psychiatric: He has a normal mood and affect. His behavior is normal. Judgment and thought content normal.          Assessment & Plan:

## 2010-11-01 NOTE — Assessment & Plan Note (Signed)
Cont w/wt loss program

## 2010-11-16 ENCOUNTER — Telehealth: Payer: Self-pay | Admitting: *Deleted

## 2010-11-16 MED ORDER — ALPRAZOLAM 2 MG PO TABS: 2.0000 mg | ORAL_TABLET | Freq: Three times a day (TID) | ORAL | Status: DC | PRN

## 2010-11-16 NOTE — Telephone Encounter (Signed)
Ok 90 thx

## 2010-11-16 NOTE — Telephone Encounter (Signed)
Called in update to pharm

## 2010-11-16 NOTE — Telephone Encounter (Signed)
Patient requesting that we call pharm and update xanax RX. He gets # 90 but last RX was # 60. OK for new rx for #90?

## 2011-03-08 ENCOUNTER — Encounter: Payer: Self-pay | Admitting: Internal Medicine

## 2011-03-08 ENCOUNTER — Other Ambulatory Visit (INDEPENDENT_AMBULATORY_CARE_PROVIDER_SITE_OTHER): Payer: BC Managed Care – PPO

## 2011-03-08 ENCOUNTER — Ambulatory Visit (INDEPENDENT_AMBULATORY_CARE_PROVIDER_SITE_OTHER): Payer: BC Managed Care – PPO | Admitting: Internal Medicine

## 2011-03-08 VITALS — BP 120/80 | HR 100 | Resp 16 | Wt 295.0 lb

## 2011-03-08 DIAGNOSIS — T887XXA Unspecified adverse effect of drug or medicament, initial encounter: Secondary | ICD-10-CM

## 2011-03-08 DIAGNOSIS — M545 Low back pain: Secondary | ICD-10-CM

## 2011-03-08 DIAGNOSIS — F411 Generalized anxiety disorder: Secondary | ICD-10-CM

## 2011-03-08 DIAGNOSIS — I10 Essential (primary) hypertension: Secondary | ICD-10-CM

## 2011-03-08 DIAGNOSIS — Z23 Encounter for immunization: Secondary | ICD-10-CM

## 2011-03-08 DIAGNOSIS — R7309 Other abnormal glucose: Secondary | ICD-10-CM

## 2011-03-08 DIAGNOSIS — I2510 Atherosclerotic heart disease of native coronary artery without angina pectoris: Secondary | ICD-10-CM

## 2011-03-08 LAB — HEPATIC FUNCTION PANEL
ALT: 37 U/L (ref 0–53)
AST: 23 U/L (ref 0–37)
Total Bilirubin: 1 mg/dL (ref 0.3–1.2)
Total Protein: 7.4 g/dL (ref 6.0–8.3)

## 2011-03-08 LAB — BASIC METABOLIC PANEL
BUN: 15 mg/dL (ref 6–23)
CO2: 29 mEq/L (ref 19–32)
Chloride: 106 mEq/L (ref 96–112)
Glucose, Bld: 93 mg/dL (ref 70–99)
Potassium: 4.4 mEq/L (ref 3.5–5.1)

## 2011-03-08 LAB — TSH: TSH: 2.39 u[IU]/mL (ref 0.35–5.50)

## 2011-03-08 MED ORDER — LISINOPRIL 10 MG PO TABS: 10.0000 mg | ORAL_TABLET | Freq: Every day | ORAL | Status: DC

## 2011-03-08 MED ORDER — TRAMADOL HCL 50 MG PO TABS: 50.0000 mg | ORAL_TABLET | Freq: Two times a day (BID) | ORAL | Status: DC | PRN

## 2011-03-08 MED ORDER — ALPRAZOLAM 2 MG PO TABS: 2.0000 mg | ORAL_TABLET | Freq: Three times a day (TID) | ORAL | Status: DC | PRN

## 2011-03-08 MED ORDER — NAPROXEN 500 MG PO TABS: 500.0000 mg | ORAL_TABLET | Freq: Two times a day (BID) | ORAL | Status: DC

## 2011-03-08 NOTE — Progress Notes (Signed)
  Subjective:    Patient ID: [name], male    DOB: 08-29-1960, 50 y.o.   MRN: [id number]  HPI   The patient is here to follow up on chronic grief/depression, anxiety, elev glu and chronic moderate LBP symptoms controlled with medicines, diet and exercise.   Review of Systems Wt Readings from Last 3 Encounters:  03/08/11 295 lb (133.811 kg)  11/01/10 301 lb (136.533 kg)  07/03/10 312 lb (141.522 kg)       Objective:   Physical Exam        Assessment & Plan:

## 2011-03-08 NOTE — Assessment & Plan Note (Signed)
Doing better.   

## 2011-03-08 NOTE — Assessment & Plan Note (Signed)
Continue with current prescription therapy as reflected on the Med list.  

## 2011-03-08 NOTE — Assessment & Plan Note (Signed)
Controlled w/NAS diet

## 2011-05-07 ENCOUNTER — Other Ambulatory Visit: Payer: Self-pay

## 2011-05-07 ENCOUNTER — Emergency Department (INDEPENDENT_AMBULATORY_CARE_PROVIDER_SITE_OTHER): Payer: BC Managed Care – PPO

## 2011-05-07 ENCOUNTER — Emergency Department (HOSPITAL_BASED_OUTPATIENT_CLINIC_OR_DEPARTMENT_OTHER)
Admission: EM | Admit: 2011-05-07 | Discharge: 2011-05-08 | Disposition: A | Discharge status: Home / Self Care | Payer: BC Managed Care – PPO | Attending: Emergency Medicine | Admitting: Emergency Medicine

## 2011-05-07 ENCOUNTER — Encounter (HOSPITAL_BASED_OUTPATIENT_CLINIC_OR_DEPARTMENT_OTHER): Payer: Self-pay | Admitting: *Deleted

## 2011-05-07 DIAGNOSIS — I2510 Atherosclerotic heart disease of native coronary artery without angina pectoris: Secondary | ICD-10-CM | POA: Insufficient documentation

## 2011-05-07 DIAGNOSIS — E669 Obesity, unspecified: Secondary | ICD-10-CM | POA: Insufficient documentation

## 2011-05-07 DIAGNOSIS — E119 Type 2 diabetes mellitus without complications: Secondary | ICD-10-CM | POA: Insufficient documentation

## 2011-05-07 DIAGNOSIS — R0902 Hypoxemia: Secondary | ICD-10-CM | POA: Insufficient documentation

## 2011-05-07 DIAGNOSIS — Z79899 Other long term (current) drug therapy: Secondary | ICD-10-CM | POA: Insufficient documentation

## 2011-05-07 DIAGNOSIS — K219 Gastro-esophageal reflux disease without esophagitis: Secondary | ICD-10-CM | POA: Insufficient documentation

## 2011-05-07 DIAGNOSIS — J40 Bronchitis, not specified as acute or chronic: Secondary | ICD-10-CM | POA: Insufficient documentation

## 2011-05-07 DIAGNOSIS — R0602 Shortness of breath: Secondary | ICD-10-CM | POA: Insufficient documentation

## 2011-05-07 DIAGNOSIS — I252 Old myocardial infarction: Secondary | ICD-10-CM | POA: Insufficient documentation

## 2011-05-07 DIAGNOSIS — E785 Hyperlipidemia, unspecified: Secondary | ICD-10-CM | POA: Insufficient documentation

## 2011-05-07 DIAGNOSIS — F17200 Nicotine dependence, unspecified, uncomplicated: Secondary | ICD-10-CM | POA: Insufficient documentation

## 2011-05-07 LAB — CARDIAC PANEL(CRET KIN+CKTOT+MB+TROPI)
CK, MB: 3.2 ng/mL (ref 0.3–4.0)
Troponin I: <0.3 ng/mL (ref <0.30)

## 2011-05-07 LAB — CBC
HCT: 47.8 % (ref 39.0–52.0)
MCV: 86.4 fL (ref 78.0–100.0)
RBC: 5.53 MIL/uL (ref 4.22–5.81)
WBC: 10.8 10*3/uL — ABNORMAL HIGH (ref 4.0–10.5)

## 2011-05-07 LAB — COMPREHENSIVE METABOLIC PANEL
BUN: 14 mg/dL (ref 6–23)
CO2: 25 mEq/L (ref 19–32)
Calcium: 9.3 mg/dL (ref 8.4–10.5)
Creatinine, Ser: 0.8 mg/dL (ref 0.50–1.35)
GFR calc Af Amer: >90 mL/min (ref >90)
GFR calc non Af Amer: >90 mL/min (ref >90)
Glucose, Bld: 114 mg/dL — ABNORMAL HIGH (ref 70–99)

## 2011-05-07 LAB — DIFFERENTIAL
Eosinophils Relative: 2 % (ref 0–5)
Lymphocytes Relative: 18 % (ref 12–46)
Lymphs Abs: 2 10*3/uL (ref 0.7–4.0)
Monocytes Absolute: 1.1 10*3/uL — ABNORMAL HIGH (ref 0.1–1.0)

## 2011-05-07 MED ORDER — ALBUTEROL SULFATE (5 MG/ML) 0.5% IN NEBU
INHALATION_SOLUTION | RESPIRATORY_TRACT | Status: AC
  Administered 2011-05-07: 5 mg via RESPIRATORY_TRACT
  Filled 2011-05-07: qty 1

## 2011-05-07 MED ORDER — IPRATROPIUM BROMIDE 0.02 % IN SOLN
RESPIRATORY_TRACT | Status: AC
  Administered 2011-05-07: 0.5 mg via RESPIRATORY_TRACT
  Filled 2011-05-07: qty 2.5

## 2011-05-07 MED ORDER — ALBUTEROL SULFATE (5 MG/ML) 0.5% IN NEBU
5.0000 mg | INHALATION_SOLUTION | Freq: Once | RESPIRATORY_TRACT | Status: AC
  Administered 2011-05-07: 5 mg via RESPIRATORY_TRACT
  Filled 2011-05-07: qty 1

## 2011-05-07 MED ORDER — SODIUM CHLORIDE 0.9 % IV SOLN
INTRAVENOUS | Status: DC
  Administered 2011-05-07: 23:00:00 via INTRAVENOUS

## 2011-05-07 MED ORDER — ALBUTEROL SULFATE (5 MG/ML) 0.5% IN NEBU
5.0000 mg | INHALATION_SOLUTION | Freq: Once | RESPIRATORY_TRACT | Status: AC
  Administered 2011-05-07: 5 mg via RESPIRATORY_TRACT

## 2011-05-07 MED ORDER — METHYLPREDNISOLONE SODIUM SUCC 125 MG IJ SOLR
125.0000 mg | Freq: Once | INTRAMUSCULAR | Status: AC
  Administered 2011-05-07: 125 mg via INTRAVENOUS
  Filled 2011-05-07: qty 2

## 2011-05-07 MED ORDER — IOHEXOL 350 MG/ML SOLN
80.0000 mL | Freq: Once | INTRAVENOUS | Status: AC | PRN
  Administered 2011-05-08: 80 mL via INTRAVENOUS

## 2011-05-07 MED ORDER — IPRATROPIUM BROMIDE 0.02 % IN SOLN
0.5000 mg | Freq: Once | RESPIRATORY_TRACT | Status: AC
  Administered 2011-05-07: 0.5 mg via RESPIRATORY_TRACT

## 2011-05-07 NOTE — ED Provider Notes (Signed)
History     CSN: [id number]  Arrival date & time 05/07/11  2207   First MD Initiated Contact with Patient 05/07/11 2240      Chief Complaint  Patient presents with  . Shortness of Breath    (Consider location/radiation/quality/duration/timing/severity/associated sxs/prior treatment) Patient is a 50 y.o. male presenting with shortness of breath. The history is provided by the patient. No language interpreter was used.  Shortness of Breath  The current episode started today. The onset was sudden. The problem has been gradually worsening. The symptoms are relieved by nothing. The symptoms are aggravated by nothing. Associated symptoms include shortness of breath and wheezing. His temperature was unmeasured prior to arrival. The cough has no precipitants. The cough is productive. Nothing relieves the cough. There was no intake of a foreign body. He has inhaled smoke recently. He has had prior hospitalizations. He has had no prior intubations. His past medical history is significant for asthma. He has been behaving normally.   Pt reports he was in the car for 4 hours today.  Pt reports sudden onset of shortness of breath.  Pt reports increasing shortness of breath.  Pt has a history of Copd.  Pt is not on any medications.   Past Medical History  Diagnosis Date  . Anxiety   . GERD (gastroesophageal reflux disease)   . Hyperlipidemia   . Diabetes mellitus      II, borderline, diet controlled  . 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 CAD (coronary artery disease)   . OSA (obstructive sleep apnea)     cpap -PSG apr30 2009, [facility]  119  . Pain, low back   . Tobacco use disorder   . Cocaine abuse     none now  . Obesity, unspecified     Past Surgical History  Procedure Date  . Fracture surgery     R. leg  . Fracture surgery     L. arm  . Back surgery     x 2    Family History  Problem Relation Age of Onset  . Heart attack Father 1    grandfather  died MI 79  . Heart disease Father     cad  . Heart attack    . Atrial fibrillation Other   . Cancer Mother     lung ca    History  Substance Use Topics  . Smoking status: Current Everyday Smoker -- 1.5 packs/day    Types: Cigarettes  . Smokeless tobacco: Never Used  . Alcohol Use: 6.0 oz/week    12 drink(s) per week      Review of Systems  Respiratory: Positive for shortness of breath and wheezing.   All other systems reviewed and are negative.    Allergies  Review of patient's allergies indicates no known allergies.  Home Medications   Current Outpatient Rx  Name Route Sig Dispense Refill  . ALPRAZOLAM 2 MG PO TABS Oral Take 2 mg by mouth 3 (three) times daily as needed. For anxiety     . LISINOPRIL 10 MG PO TABS Oral Take 1 tablet (10 mg total) by mouth daily. 30 tablet 5  . PHENYLEPHRINE-APAP-GUAIFENESIN 5-325-200 MG/15ML PO LIQD Oral Take 30 mLs by mouth every 6 (six) hours as needed. For congestion     . PSEUDOEPH-DOXYLAMINE-DM-APAP 60-7.10-09-998 MG/30ML PO LIQD Oral Take 30 mLs by mouth at bedtime as needed. For congestion     . TRAMADOL HCL 50 MG PO TABS  Oral Take 1 tablet (50 mg total) by mouth 2 (two) times daily as needed. 60 tablet 5  . NITROGLYCERIN 0.4 MG SL SUBL Sublingual Place 0.4 mg under the tongue every 5 (five) minutes as needed.        BP 146/71  Pulse 111  Resp 40  Ht 6' (1.829 m)  Wt 300 lb (136.079 kg)  BMI 40.69 kg/m2  SpO2 90%  Physical Exam  Nursing note and vitals reviewed. Constitutional: He is oriented to person, place, and time. He appears well-developed and well-nourished.  HENT:  Head: Normocephalic.  Right Ear: External ear normal.  Eyes: Conjunctivae are normal. Pupils are equal, round, and reactive to light.  Neck: Normal range of motion. Neck supple.  Cardiovascular: Normal rate and normal heart sounds.   Pulmonary/Chest: He has wheezes.  Abdominal: Soft.  Musculoskeletal: Normal range of motion.  Neurological: He is  alert and oriented to person, place, and time. He has normal reflexes.  Skin: Skin is warm.  Psychiatric: He has a normal mood and affect.    ED Course  Procedures (including critical care time)  Labs Reviewed - No data to display No results found.   No diagnosis found.    MDM  Pt given 2 albuterol nebs,  Iv solumedrol.  I ordered Ct scan of pt's chest.  Pt's care turned over to Dr. Fredricka Bonine at 11p       Langston Masker, [place] 05/07/11 2254  Langston Masker, [place] 05/07/11 2255

## 2011-05-07 NOTE — ED Notes (Signed)
Pt with long history of smoking, smokes 2ppd, sudden onset of sob earlier today which has progressively worsened throughout the day, pt states "I kept getting more short of breath and I was watching tv and Jesus was on there and I said  'Oh Shit, I'm gonna die.' So I thought I had better come to the ER".

## 2011-05-07 NOTE — ED Notes (Signed)
Pt reports he woke up feeling SOB x 2 hours- states was in car x 4 hours today- O2 sats 92% ra

## 2011-05-08 ENCOUNTER — Encounter: Payer: Self-pay | Admitting: Internal Medicine

## 2011-05-08 ENCOUNTER — Ambulatory Visit (INDEPENDENT_AMBULATORY_CARE_PROVIDER_SITE_OTHER): Payer: BC Managed Care – PPO | Admitting: Internal Medicine

## 2011-05-08 VITALS — BP 148/70 | HR 87 | Temp 98.8°F | Resp 16 | Wt 301.0 lb

## 2011-05-08 DIAGNOSIS — J988 Other specified respiratory disorders: Secondary | ICD-10-CM | POA: Insufficient documentation

## 2011-05-08 DIAGNOSIS — J209 Acute bronchitis, unspecified: Secondary | ICD-10-CM

## 2011-05-08 DIAGNOSIS — R05 Cough: Secondary | ICD-10-CM

## 2011-05-08 DIAGNOSIS — R0602 Shortness of breath: Secondary | ICD-10-CM

## 2011-05-08 HISTORY — DX: Other specified respiratory disorders: J98.8

## 2011-05-08 HISTORY — DX: Acute bronchitis, unspecified: J20.9

## 2011-05-08 MED ORDER — MOXIFLOXACIN HCL IN NACL 400 MG/250ML IV SOLN
400.0000 mg | Freq: Once | INTRAVENOUS | Status: AC
  Administered 2011-05-08: 400 mg via INTRAVENOUS
  Filled 2011-05-08: qty 250

## 2011-05-08 MED ORDER — IPRATROPIUM BROMIDE 0.02 % IN SOLN
0.5000 mg | Freq: Once | RESPIRATORY_TRACT | Status: AC
  Administered 2011-05-08: 0.5 mg via RESPIRATORY_TRACT
  Filled 2011-05-08: qty 2.5

## 2011-05-08 MED ORDER — OSELTAMIVIR PHOSPHATE 75 MG PO CAPS
75.0000 mg | ORAL_CAPSULE | Freq: Once | ORAL | Status: AC
  Administered 2011-05-08: 75 mg via ORAL
  Filled 2011-05-08: qty 1

## 2011-05-08 MED ORDER — METHYLPREDNISOLONE 4 MG PO KIT: PACK | ORAL | Status: AC

## 2011-05-08 MED ORDER — CEFUROXIME AXETIL 500 MG PO TABS: 500.0000 mg | ORAL_TABLET | Freq: Two times a day (BID) | ORAL | Status: AC

## 2011-05-08 MED ORDER — LEVALBUTEROL TARTRATE 45 MCG/ACT IN AERO: 1.0000 | INHALATION_SPRAY | RESPIRATORY_TRACT | Status: DC | PRN

## 2011-05-08 MED ORDER — ALBUTEROL (5 MG/ML) CONTINUOUS INHALATION SOLN
7.5000 mg/h | INHALATION_SOLUTION | Freq: Once | RESPIRATORY_TRACT | Status: AC
  Administered 2011-05-08: 7.5 mg/h via RESPIRATORY_TRACT
  Filled 2011-05-08: qty 0.5
  Filled 2011-05-08: qty 20

## 2011-05-08 MED ORDER — GUAIFENESIN-CODEINE 100-10 MG/5ML PO SYRP: 5.0000 mL | ORAL_SOLUTION | Freq: Three times a day (TID) | ORAL | Status: AC | PRN

## 2011-05-08 MED ORDER — ALBUTEROL SULFATE (5 MG/ML) 0.5% IN NEBU
2.5000 mg | INHALATION_SOLUTION | Freq: Once | RESPIRATORY_TRACT | Status: AC
  Administered 2011-05-08: 2.5 mg via RESPIRATORY_TRACT
  Filled 2011-05-08: qty 1

## 2011-05-08 NOTE — Assessment & Plan Note (Signed)
There was no evidence of ischemia in the CPK level done in the ER 2 days ago and the EKG was not diagnostic

## 2011-05-08 NOTE — ED Notes (Signed)
Upon entering the room to perform a RA Spo2 check, pt stated that he did not want to stay here any longer and demanded that his IV be removed so that he could leave.  Pt was advised in the presence of his wife that his status would decline and that his Spo2 was 89% on RA and declining.  Pt was advised to finish abx infusion, remain on O2 and be transferred to hospital for admission. Pt declined all of these services and signed AMA form.  Pt states that he could not tolerate being here any further and voiced understanding that he would call 911 when he was no longer able to breath at home, should he fall into respiratory distress which given his current status seems evident.

## 2011-05-08 NOTE — Patient Instructions (Signed)

## 2011-05-08 NOTE — Progress Notes (Signed)
Subjective:    Patient ID: [name], male    DOB: [birth year], 50 y.o.   MRN: [id number]  Cough This is a new problem. Episode onset: for 2-3 days. The problem has been unchanged. The problem occurs every few hours. The cough is productive of purulent sputum. Associated symptoms include chills, a fever, rhinorrhea, shortness of breath, sweats and wheezing. Pertinent negatives include no chest pain, ear congestion, ear pain, headaches, heartburn, hemoptysis, myalgias, nasal congestion, postnasal drip, rash, sore throat or weight loss. The symptoms are aggravated by cold air. He has tried a beta-agonist inhaler for the symptoms. The treatment provided moderate relief. His past medical history is significant for pneumonia.      Review of Systems  Constitutional: Positive for fever and chills. Negative for weight loss, diaphoresis, activity change, appetite change, fatigue and unexpected weight change.  HENT: Positive for rhinorrhea. Negative for hearing loss, ear pain, congestion, sore throat, facial swelling, sneezing, trouble swallowing, neck pain, neck stiffness, dental problem, voice change, postnasal drip, sinus pressure and ear discharge.   Eyes: Negative.   Respiratory: Positive for cough, shortness of breath and wheezing. Negative for hemoptysis.   Cardiovascular: Negative for chest pain, palpitations and leg swelling.  Gastrointestinal: Negative for heartburn, nausea, vomiting, abdominal pain, diarrhea and constipation.  Genitourinary: Negative.   Musculoskeletal: Negative for myalgias, back pain, joint swelling, arthralgias and gait problem.  Skin: Negative for color change, pallor, rash and wound.  Neurological: Negative for dizziness, tremors, seizures, syncope, facial asymmetry, speech difficulty, weakness, light-headedness, numbness and headaches.  Hematological: Negative for adenopathy. Does not bruise/bleed easily.  Psychiatric/Behavioral: Negative.          Objective:   Physical Exam  Vitals reviewed. Constitutional: He is oriented to person, place, and time. He appears well-developed and well-nourished. No distress.  HENT:  Head: Normocephalic and atraumatic.  Mouth/Throat: Oropharynx is clear and moist. No oropharyngeal exudate.  Eyes: Conjunctivae are normal. Right eye exhibits no discharge. Left eye exhibits no discharge. No scleral icterus.  Neck: Normal range of motion. Neck supple. No JVD present. No tracheal deviation present. No thyromegaly present.  Cardiovascular: Normal rate, regular rhythm, normal heart sounds and intact distal pulses.  Exam reveals no gallop and no friction rub.   No murmur heard. Pulmonary/Chest: Effort normal. No accessory muscle usage or stridor. Not tachypneic. No respiratory distress. He has no decreased breath sounds. He has wheezes in the right middle field and the left middle field. He has rhonchi in the right upper field, the right middle field, the right lower field, the left upper field, the left middle field and the left lower field. He has no rales. He exhibits no tenderness.  Abdominal: Soft. Bowel sounds are normal. He exhibits no distension and no mass. There is no tenderness. There is no rebound and no guarding.  Musculoskeletal: Normal range of motion. He exhibits no edema and no tenderness.  Lymphadenopathy:    He has no cervical adenopathy.  Neurological: He is oriented to person, place, and time.  Skin: Skin is warm and dry. No rash noted. He is not diaphoretic. No erythema. No pallor.  Psychiatric: He has a normal mood and affect. His behavior is normal. Judgment and thought content normal.      Lab Results  Component Value Date   WBC 10.8* 05/07/2011   HGB 16.7 05/07/2011   HCT 47.8 05/07/2011   PLT 152 05/07/2011   GLUCOSE 114* 05/07/2011   CHOL 176 03/23/2010   TRIG 232.0* 03/23/2010  HDL 32.30* 03/23/2010   LDLDIRECT 102.4 03/23/2010   LDLCALC 114* 06/15/2008   ALT 26  05/07/2011   AST 17 05/07/2011   NA 138 05/07/2011   K 4.1 05/07/2011   CL 103 05/07/2011   CREATININE 0.80 05/07/2011   BUN 14 05/07/2011   CO2 25 05/07/2011   TSH 2.39 03/08/2011   PSA 0.25 09/30/2008   INR 1.0 ratio 01/11/2010   HGBA1C 5.6 03/08/2011      Assessment & Plan:

## 2011-05-08 NOTE — ED Provider Notes (Signed)
  I performed a history and physical examination of [name] and discussed his management with [name].  I agree with the history, physical, assessment, and plan of care, with the following exceptions: None  I personally evaluated this patient face-to-face as well as discussed his care plan with him multiple times as well as his diagnosis. The patient appeared not to have a pulmonary embolism nor a pneumonia, but appeared to have bronchitis amidst possible early COPD (as of yet undiagnosed), do either to viral or bacterial process. The patient required numerous and repeat bronchodilator nebulizer treatments, not so much for wheezing as fourth diminished air sounds, dyspnea, and persistent hypoxia requiring 3 L of oxygen by nasal cannula to maintain an oxygen saturation of 90-92%. The patient does not use oxygen at home. He was also treated with IV steroid and IV antibiotic. After a continuous hour-long nebulized treatment failed to improve his oxygen saturation were work of breathing, I was actually on the phone with the hospitalist at [facility] arranging admission for the patient when he abruptly to hold his nurse that he was not going to stay in the emergency department any longer or stay for admission as I had told him with the plan. He left the emergency department AGAINST MEDICAL ADVICE. Prior to his abrupt departure from the ED, he had asked me previously "why do I need to be admitted to the hospital, what would happen if I just left to go home?" I then told him that it was possible that his work of breathing in his air movement in and out of his lungs will gradually worsen perhaps even causing him to lose consciousness or have impaired mental status and that potentially he could die from hypoxia or ventilatory failure. I told him that at the very least, I did not expect his condition to improve anytime soon without ongoing medical treatment in the hospital. The patient  therefore made the decision to leave AGAINST MEDICAL ADVICE after he had been informed of the benefits and risks of hospitalization and the risks of leaving prior to admission and further treatment to improve his condition. The patient was advised by his nurse to return to the emergency department immediately when he changed his mind or when his condition deteriorated.  I was present for the following procedures: None Time Spent in Critical Care of the patient: None Time spent in discussions with the patient and family: 20 minutes  [name]    Nathan Bonier, MD 05/08/11 [phone]

## 2011-05-08 NOTE — ED Notes (Addendum)
Pt states that he is ready to go home now, doesn't feel comfortable in the stretcher, was found sitting at the bedside in a chair.  States that he is dissatisfied with the wait and wants to go home now. Provider notified.

## 2011-05-08 NOTE — Assessment & Plan Note (Signed)
He has been using an old xopenex inhaler at home with good response so he will continue that, also I asked him to start a medrol dose pak as well

## 2011-05-08 NOTE — Assessment & Plan Note (Signed)
Start ceftin for the infection and a cough suppressant 

## 2011-05-31 ENCOUNTER — Telehealth: Payer: Self-pay | Admitting: *Deleted

## 2011-05-31 NOTE — Telephone Encounter (Signed)
Pt states he was seen in office 12.26.12 for bronchitis and he "cannot seem to shake it"; requesting ABX.

## 2011-06-03 NOTE — Telephone Encounter (Signed)
OV w/any MD Thx 

## 2011-06-04 NOTE — Telephone Encounter (Signed)
Pt advised of MD's recommendation and was very unhappy, stating he has "gotten through it" and hung the phone up.

## 2011-06-04 NOTE — Telephone Encounter (Signed)
Just receiving responses from Dr Posey Rea today; dated prior.?

## 2011-06-10 ENCOUNTER — Ambulatory Visit: Payer: BC Managed Care – PPO | Admitting: Internal Medicine

## 2011-06-10 ENCOUNTER — Ambulatory Visit (INDEPENDENT_AMBULATORY_CARE_PROVIDER_SITE_OTHER): Payer: BC Managed Care – PPO | Admitting: Internal Medicine

## 2011-06-10 ENCOUNTER — Encounter: Payer: Self-pay | Admitting: Internal Medicine

## 2011-06-10 DIAGNOSIS — J988 Other specified respiratory disorders: Secondary | ICD-10-CM

## 2011-06-10 DIAGNOSIS — M545 Low back pain, unspecified: Secondary | ICD-10-CM

## 2011-06-10 DIAGNOSIS — J209 Acute bronchitis, unspecified: Secondary | ICD-10-CM

## 2011-06-10 DIAGNOSIS — R0602 Shortness of breath: Secondary | ICD-10-CM

## 2011-06-10 DIAGNOSIS — I10 Essential (primary) hypertension: Secondary | ICD-10-CM

## 2011-06-10 DIAGNOSIS — J019 Acute sinusitis, unspecified: Secondary | ICD-10-CM

## 2011-06-10 HISTORY — DX: Acute sinusitis, unspecified: J01.90

## 2011-06-10 MED ORDER — MOXIFLOXACIN HCL 400 MG PO TABS: 400.0000 mg | ORAL_TABLET | Freq: Every day | ORAL | Status: AC

## 2011-06-10 MED ORDER — AZELASTINE-FLUTICASONE 137-50 MCG/ACT NA SUSP: 1.0000 | NASAL | Status: DC

## 2011-06-10 MED ORDER — NAPROXEN 500 MG PO TABS: 500.0000 mg | ORAL_TABLET | Freq: Two times a day (BID) | ORAL | Status: DC

## 2011-06-10 MED ORDER — FLUTICASONE-SALMETEROL 250-50 MCG/DOSE IN AEPB: 1.0000 | INHALATION_SPRAY | Freq: Two times a day (BID) | RESPIRATORY_TRACT | Status: DC

## 2011-06-10 MED ORDER — OLMESARTAN MEDOXOMIL 40 MG PO TABS: 40.0000 mg | ORAL_TABLET | Freq: Every day | ORAL | Status: DC

## 2011-06-10 MED ORDER — TRAMADOL HCL 50 MG PO TABS: 50.0000 mg | ORAL_TABLET | Freq: Two times a day (BID) | ORAL | Status: DC | PRN

## 2011-06-10 NOTE — Assessment & Plan Note (Signed)
HHN

## 2011-06-10 NOTE — Progress Notes (Signed)
Patient ID: [name], male   DOB: 1960/08/26, 51 y.o.   MRN: [id number]  Subjective:    Patient ID: [name], male    DOB: 02-26-1961, 51 y.o.   MRN: [id number]  Cough This is a new problem. The current episode started more than 1 month ago (for 2-3 days). The problem has been gradually worsening. The problem occurs every few hours. The cough is productive of purulent sputum and productive of brown sputum. Associated symptoms include chills, a fever, postnasal drip, rhinorrhea, shortness of breath, sweats and wheezing. Pertinent negatives include no chest pain, ear congestion, ear pain, headaches, heartburn, hemoptysis, myalgias, nasal congestion, rash, sore throat or weight loss. The symptoms are aggravated by cold air. He has tried a beta-agonist inhaler and OTC cough suppressant for the symptoms. The treatment provided no relief. His past medical history is significant for COPD and pneumonia.  C/o SOB, nasal congestion, nasal yellow d/c x 1 mo    Review of Systems  Constitutional: Positive for fever and chills. Negative for weight loss, diaphoresis, activity change, appetite change, fatigue and unexpected weight change.  HENT: Positive for congestion, rhinorrhea and postnasal drip. Negative for hearing loss, ear pain, sore throat, facial swelling, sneezing, trouble swallowing, neck pain, neck stiffness, dental problem, voice change, sinus pressure and ear discharge.   Eyes: Negative.   Respiratory: Positive for cough, shortness of breath and wheezing. Negative for hemoptysis.   Cardiovascular: Negative for chest pain, palpitations and leg swelling.  Gastrointestinal: Negative for heartburn, nausea, vomiting, abdominal pain, diarrhea and constipation.  Genitourinary: Negative.   Musculoskeletal: Negative for myalgias, back pain, joint swelling, arthralgias and gait problem.  Skin: Negative for color change, pallor, rash and wound.  Neurological: Negative for  dizziness, tremors, seizures, syncope, facial asymmetry, speech difficulty, weakness, light-headedness, numbness and headaches.  Hematological: Negative for adenopathy. Does not bruise/bleed easily.  Psychiatric/Behavioral: Negative.        Objective:   Physical Exam  Vitals reviewed. Constitutional: He is oriented to person, place, and time. He appears well-developed and well-nourished. No distress.  HENT:  Head: Normocephalic and atraumatic.  Mouth/Throat: No oropharyngeal exudate.       Nasal mucosa is swollen  Eyes: Conjunctivae are normal. Right eye exhibits no discharge. Left eye exhibits no discharge. No scleral icterus.  Neck: Normal range of motion. Neck supple. No JVD present. No tracheal deviation present. No thyromegaly present.  Cardiovascular: Normal rate, regular rhythm, normal heart sounds and intact distal pulses.  Exam reveals no gallop and no friction rub.   No murmur heard. Pulmonary/Chest: Effort normal. No accessory muscle usage or stridor. Not tachypneic. No respiratory distress. He has no decreased breath sounds. He has wheezes in the right middle field and the left middle field. He has rhonchi in the right upper field, the right middle field, the right lower field, the left upper field, the left middle field and the left lower field. Rales: B. He exhibits no tenderness.  Abdominal: Soft. Bowel sounds are normal. He exhibits no distension and no mass. There is no tenderness. There is no rebound and no guarding.  Musculoskeletal: Normal range of motion. He exhibits no edema and no tenderness.  Lymphadenopathy:    He has no cervical adenopathy.  Neurological: He is oriented to person, place, and time.  Skin: Skin is warm and dry. No rash noted. He is not diaphoretic. No erythema. No pallor.  Psychiatric: He has a normal mood and affect. His behavior is normal. Judgment and thought  content normal.    I personally provided the Advair  inhaler use teaching. After the  teaching patient was able to demonstrate it's use effectively. All questions were answered   Lab Results  Component Value Date   WBC 10.8* 05/07/2011   HGB 16.7 05/07/2011   HCT 47.8 05/07/2011   PLT 152 05/07/2011   GLUCOSE 114* 05/07/2011   CHOL 176 03/23/2010   TRIG 232.0* 03/23/2010   HDL 32.30* 03/23/2010   LDLDIRECT 102.4 03/23/2010   LDLCALC 114* 06/15/2008   ALT 26 05/07/2011   AST 17 05/07/2011   NA 138 05/07/2011   K 4.1 05/07/2011   CL 103 05/07/2011   CREATININE 0.80 05/07/2011   BUN 14 05/07/2011   CO2 25 05/07/2011   TSH 2.39 03/08/2011   PSA 0.25 09/30/2008   INR 1.0 ratio 01/11/2010   HGBA1C 5.6 03/08/2011      Assessment & Plan:   HHN

## 2011-06-10 NOTE — Assessment & Plan Note (Addendum)
Avelox Advair500/50 bid Depo 120 mg im D/c Lisinopril

## 2011-06-10 NOTE — Assessment & Plan Note (Signed)
1/13 Avelox x 14 d Dymista qd

## 2011-06-10 NOTE — Patient Instructions (Signed)
Stop Lisinopril. Take Benicar 40 mg a day instead

## 2011-06-10 NOTE — Assessment & Plan Note (Signed)
Increase Tramadol to 3/d if needed Added Naproxen

## 2011-06-10 NOTE — Assessment & Plan Note (Signed)
D/c Lisinopril Start Benicar

## 2011-06-10 NOTE — Assessment & Plan Note (Signed)
Avelox x 14 d

## 2011-06-11 MED ORDER — ALBUTEROL SULFATE (2.5 MG/3ML) 0.083% IN NEBU: 2.5000 mg | INHALATION_SOLUTION | Freq: Once | RESPIRATORY_TRACT | Status: DC

## 2011-06-11 MED ORDER — METHYLPREDNISOLONE ACETATE PF 80 MG/ML IJ SUSP
120.0000 mg | Freq: Once | INTRAMUSCULAR | Status: AC
  Administered 2011-06-11: 120 mg via INTRAMUSCULAR

## 2011-06-11 NOTE — Progress Notes (Signed)
Addended by: Merrilyn Puma on: 06/11/2011 10:18 AM   Modules accepted: Orders

## 2011-07-12 ENCOUNTER — Ambulatory Visit (INDEPENDENT_AMBULATORY_CARE_PROVIDER_SITE_OTHER): Payer: BC Managed Care – PPO | Admitting: Internal Medicine

## 2011-07-12 ENCOUNTER — Encounter: Payer: Self-pay | Admitting: Internal Medicine

## 2011-07-12 DIAGNOSIS — F4321 Adjustment disorder with depressed mood: Secondary | ICD-10-CM

## 2011-07-12 DIAGNOSIS — J019 Acute sinusitis, unspecified: Secondary | ICD-10-CM

## 2011-07-12 DIAGNOSIS — I10 Essential (primary) hypertension: Secondary | ICD-10-CM

## 2011-07-12 MED ORDER — ALPRAZOLAM 2 MG PO TABS: 2.0000 mg | ORAL_TABLET | Freq: Three times a day (TID) | ORAL | Status: DC | PRN

## 2011-07-12 MED ORDER — LOSARTAN POTASSIUM 100 MG PO TABS: 100.0000 mg | ORAL_TABLET | Freq: Every day | ORAL | Status: DC

## 2011-07-12 NOTE — Assessment & Plan Note (Signed)
Continue with current prescription therapy as reflected on the Med list.  

## 2011-07-12 NOTE — Progress Notes (Signed)
Patient ID: Zadyn Yardley, male   DOB: Nov 16, 1960, 51 y.o.   MRN: [id number]  Subjective:    Patient ID: Akim Watkinson, male    DOB: [birth year], 51 y.o.   MRN: [id number]  HPI   The patient is here to follow up on chronic grief/depression, anxiety, elev glu and chronic moderate LBP symptoms controlled with medicines Loosing wt on diet   Review of Systems  Constitutional: Negative for appetite change, fatigue and unexpected weight change.  HENT: Negative for nosebleeds, congestion, sore throat, sneezing, trouble swallowing and neck pain.   Eyes: Negative for itching and visual disturbance.  Respiratory: Negative for cough.   Cardiovascular: Negative for chest pain, palpitations and leg swelling.  Gastrointestinal: Negative for nausea, diarrhea, blood in stool and abdominal distention.  Genitourinary: Negative for frequency and hematuria.  Musculoskeletal: Negative for back pain, joint swelling and gait problem.  Skin: Negative for rash.  Neurological: Negative for dizziness, tremors, speech difficulty and weakness.  Psychiatric/Behavioral: Negative for suicidal ideas, sleep disturbance, dysphoric mood and agitation. The patient is not nervous/anxious.    Wt Readings from Last 3 Encounters:  07/12/11 295 lb (133.811 kg)  05/08/11 301 lb (136.533 kg)  05/07/11 300 lb (136.079 kg)   BP Readings from Last 3 Encounters:  07/12/11 120/80  06/10/11 118/82  05/08/11 148/70       Objective:   Physical Exam  Constitutional: He is oriented to person, place, and time. He appears well-developed. No distress.       obese  HENT:  Mouth/Throat: Oropharynx is clear and moist.  Eyes: Conjunctivae are normal. Pupils are equal, round, and reactive to light.  Neck: Normal range of motion. No JVD present. No thyromegaly present.  Cardiovascular: Normal rate, regular rhythm, normal heart sounds and intact distal pulses.  Exam reveals no gallop and no friction rub.   No  murmur heard. Pulmonary/Chest: Effort normal and breath sounds normal. No respiratory distress. He has no wheezes. He has no rales. He exhibits no tenderness.  Abdominal: Soft. Bowel sounds are normal. He exhibits no distension and no mass. There is no tenderness. There is no rebound and no guarding.  Musculoskeletal: Normal range of motion. He exhibits no edema and no tenderness.  Lymphadenopathy:    He has no cervical adenopathy.  Neurological: He is alert and oriented to person, place, and time. He has normal reflexes. No cranial nerve deficit. He exhibits normal muscle tone. Coordination normal.  Skin: Skin is warm and dry. No rash noted.  Psychiatric: He has a normal mood and affect. His behavior is normal. Judgment and thought content normal.   Lab Results  Component Value Date   WBC 10.8* 05/07/2011   HGB 16.7 05/07/2011   HCT 47.8 05/07/2011   PLT 152 05/07/2011   GLUCOSE 114* 05/07/2011   CHOL 176 03/23/2010   TRIG 232.0* 03/23/2010   HDL 32.30* 03/23/2010   LDLDIRECT 102.4 03/23/2010   LDLCALC 114* 06/15/2008   ALT 26 05/07/2011   AST 17 05/07/2011   NA 138 05/07/2011   K 4.1 05/07/2011   CL 103 05/07/2011   CREATININE 0.80 05/07/2011   BUN 14 05/07/2011   CO2 25 05/07/2011   TSH 2.39 03/08/2011   PSA 0.25 09/30/2008   INR 1.0 ratio 01/11/2010   HGBA1C 5.6 03/08/2011         Assessment & Plan:

## 2011-07-12 NOTE — Assessment & Plan Note (Signed)
Continue with current prescription therapy as reflected on the Med list. May switch to Losartan due to cost

## 2011-07-12 NOTE — Assessment & Plan Note (Signed)
Chronic. 

## 2011-07-12 NOTE — Assessment & Plan Note (Signed)
Chronic Declined lap band

## 2011-07-23 ENCOUNTER — Ambulatory Visit (INDEPENDENT_AMBULATORY_CARE_PROVIDER_SITE_OTHER): Payer: BC Managed Care – PPO | Admitting: Internal Medicine

## 2011-07-23 ENCOUNTER — Encounter: Payer: Self-pay | Admitting: Internal Medicine

## 2011-07-23 VITALS — BP 120/82 | HR 87 | Temp 98.7°F | Wt 298.0 lb

## 2011-07-23 DIAGNOSIS — L259 Unspecified contact dermatitis, unspecified cause: Secondary | ICD-10-CM

## 2011-07-23 DIAGNOSIS — R0602 Shortness of breath: Secondary | ICD-10-CM

## 2011-07-23 DIAGNOSIS — J019 Acute sinusitis, unspecified: Secondary | ICD-10-CM

## 2011-07-23 DIAGNOSIS — L309 Dermatitis, unspecified: Secondary | ICD-10-CM

## 2011-07-23 DIAGNOSIS — J209 Acute bronchitis, unspecified: Secondary | ICD-10-CM

## 2011-07-23 MED ORDER — HYDROCORTISONE ACETATE 25 MG RE SUPP: 25.0000 mg | Freq: Two times a day (BID) | RECTAL | Status: AC

## 2011-07-23 MED ORDER — AMOXICILLIN-POT CLAVULANATE 875-125 MG PO TABS: 1.0000 | ORAL_TABLET | Freq: Two times a day (BID) | ORAL | Status: AC

## 2011-07-23 MED ORDER — METHYLPREDNISOLONE ACETATE 80 MG/ML IJ SUSP
120.0000 mg | Freq: Once | INTRAMUSCULAR | Status: AC
  Administered 2011-07-23: 120 mg via INTRAMUSCULAR

## 2011-07-23 MED ORDER — AMOXICILLIN-POT CLAVULANATE 875-125 MG PO TABS: 1.0000 | ORAL_TABLET | Freq: Two times a day (BID) | ORAL | Status: DC

## 2011-07-23 MED ORDER — TRIAMCINOLONE ACETONIDE 0.5 % EX CREA: TOPICAL_CREAM | Freq: Three times a day (TID) | CUTANEOUS | Status: DC

## 2011-07-24 DIAGNOSIS — L309 Dermatitis, unspecified: Secondary | ICD-10-CM | POA: Insufficient documentation

## 2011-07-24 NOTE — Assessment & Plan Note (Signed)
COPD exacerbation 3/13 Depomedrol 120 mg

## 2011-07-24 NOTE — Assessment & Plan Note (Signed)
Triamc tid 

## 2011-07-24 NOTE — Assessment & Plan Note (Signed)
Start abx

## 2011-07-24 NOTE — Progress Notes (Signed)
Patient ID: [name], male   DOB: 1961/04/23, 51 y.o.   MRN: [id number] Patient ID: [name], male   DOB: 11-25-1960, 51 y.o.   MRN: [id number]  Subjective:    Patient ID: [name], male    DOB: [birth year], 51 y.o.   MRN: [id number]  Cough This is a new problem. The current episode started in the past 7 days (for 2-3 days). The problem has been gradually worsening. The problem occurs every few hours. The cough is productive of purulent sputum and productive of brown sputum. Associated symptoms include chills, a fever, headaches, postnasal drip, rhinorrhea, a sore throat, shortness of breath, sweats and wheezing. Pertinent negatives include no chest pain, ear congestion, ear pain, heartburn, hemoptysis, myalgias, nasal congestion, rash or weight loss. The symptoms are aggravated by cold air. He has tried a beta-agonist inhaler and OTC cough suppressant for the symptoms. The treatment provided no relief. His past medical history is significant for COPD and pneumonia.  Headache  This is a new problem. The problem occurs constantly. Associated symptoms include coughing, drainage, a fever, rhinorrhea and a sore throat. Pertinent negatives include no abdominal pain, back pain, dizziness, ear pain, hearing loss, nausea, neck pain, numbness, seizures, sinus pressure, vomiting, weakness or weight loss.  C/o SOB, nasal congestion, nasal yellow d/c x 1 mo    Review of Systems  Constitutional: Positive for fever and chills. Negative for weight loss, diaphoresis, activity change, appetite change, fatigue and unexpected weight change.  HENT: Positive for congestion, sore throat, rhinorrhea and postnasal drip. Negative for hearing loss, ear pain, facial swelling, sneezing, trouble swallowing, neck pain, neck stiffness, dental problem, voice change, sinus pressure and ear discharge.   Eyes: Negative.   Respiratory: Positive for cough, shortness of breath and  wheezing. Negative for hemoptysis.   Cardiovascular: Negative for chest pain, palpitations and leg swelling.  Gastrointestinal: Negative for heartburn, nausea, vomiting, abdominal pain, diarrhea and constipation.  Genitourinary: Negative.   Musculoskeletal: Negative for myalgias, back pain, joint swelling, arthralgias and gait problem.  Skin: Negative for color change, pallor, rash and wound.  Neurological: Positive for headaches. Negative for dizziness, tremors, seizures, syncope, facial asymmetry, speech difficulty, weakness, light-headedness and numbness.  Hematological: Negative for adenopathy. Does not bruise/bleed easily.  Psychiatric/Behavioral: Negative.        Objective:   Physical Exam  Vitals reviewed. Constitutional: He is oriented to person, place, and time. He appears well-developed and well-nourished. No distress.  HENT:  Head: Normocephalic and atraumatic.  Mouth/Throat: No oropharyngeal exudate.       Nasal mucosa is swollen Green d/c  Eyes: Conjunctivae are normal. Right eye exhibits no discharge. Left eye exhibits no discharge. No scleral icterus.  Neck: Normal range of motion. Neck supple. No JVD present. No tracheal deviation present. No thyromegaly present.  Cardiovascular: Normal rate, regular rhythm, normal heart sounds and intact distal pulses.  Exam reveals no gallop and no friction rub.   No murmur heard. Pulmonary/Chest: Effort normal. No accessory muscle usage or stridor. Not tachypneic. No respiratory distress. He has no decreased breath sounds. He has wheezes in the right middle field and the left middle field. He has rhonchi in the right upper field, the right middle field, the right lower field, the left upper field, the left middle field and the left lower field. Rales: B. He exhibits no tenderness.  Abdominal: Soft. Bowel sounds are normal. He exhibits no distension and no mass. There is no tenderness. There is no rebound  and no guarding.    Musculoskeletal: Normal range of motion. He exhibits no edema and no tenderness.  Lymphadenopathy:    He has no cervical adenopathy.  Neurological: He is oriented to person, place, and time.  Skin: Skin is warm and dry. Rash (RLQ dry eczema 10 cm patch) noted. He is not diaphoretic. No erythema. No pallor.  Psychiatric: He has a normal mood and affect. His behavior is normal. Judgment and thought content normal.    I personally provided the Advair  inhaler use teaching. After the teaching patient was able to demonstrate it's use effectively. All questions were answered   Lab Results  Component Value Date   WBC 10.8* 05/07/2011   HGB 16.7 05/07/2011   HCT 47.8 05/07/2011   PLT 152 05/07/2011   GLUCOSE 114* 05/07/2011   CHOL 176 03/23/2010   TRIG 232.0* 03/23/2010   HDL 32.30* 03/23/2010   LDLDIRECT 102.4 03/23/2010   LDLCALC 114* 06/15/2008   ALT 26 05/07/2011   AST 17 05/07/2011   NA 138 05/07/2011   K 4.1 05/07/2011   CL 103 05/07/2011   CREATININE 0.80 05/07/2011   BUN 14 05/07/2011   CO2 25 05/07/2011   TSH 2.39 03/08/2011   PSA 0.25 09/30/2008   INR 1.0 ratio 01/11/2010   HGBA1C 5.6 03/08/2011      Assessment & Plan:   HHN

## 2011-07-24 NOTE — Assessment & Plan Note (Signed)
Start Augmentin 

## 2011-10-23 ENCOUNTER — Ambulatory Visit (INDEPENDENT_AMBULATORY_CARE_PROVIDER_SITE_OTHER): Payer: BC Managed Care – PPO | Admitting: Internal Medicine

## 2011-10-23 ENCOUNTER — Encounter: Payer: Self-pay | Admitting: Internal Medicine

## 2011-10-23 VITALS — BP 128/90 | HR 80 | Temp 98.2°F | Resp 16 | Wt 296.0 lb

## 2011-10-23 DIAGNOSIS — M545 Low back pain: Secondary | ICD-10-CM

## 2011-10-23 DIAGNOSIS — F411 Generalized anxiety disorder: Secondary | ICD-10-CM

## 2011-10-23 DIAGNOSIS — K219 Gastro-esophageal reflux disease without esophagitis: Secondary | ICD-10-CM

## 2011-10-23 DIAGNOSIS — I10 Essential (primary) hypertension: Secondary | ICD-10-CM

## 2011-10-23 DIAGNOSIS — G4733 Obstructive sleep apnea (adult) (pediatric): Secondary | ICD-10-CM

## 2011-10-23 MED ORDER — NAPROXEN 500 MG PO TABS: 500.0000 mg | ORAL_TABLET | Freq: Two times a day (BID) | ORAL | Status: DC

## 2011-10-23 MED ORDER — TRAMADOL HCL 50 MG PO TABS: 50.0000 mg | ORAL_TABLET | Freq: Two times a day (BID) | ORAL | Status: DC | PRN

## 2011-10-23 MED ORDER — LOSARTAN POTASSIUM 100 MG PO TABS: 100.0000 mg | ORAL_TABLET | Freq: Every day | ORAL | Status: DC

## 2011-10-23 NOTE — Progress Notes (Signed)
   Subjective:    Patient ID: [name], male    DOB: February 17, 1961, 51 y.o.   MRN: [id number]  HPI   The patient is here to follow up on chronic HTN, anxiety, elev glu and chronic moderate LBP symptoms controlled with medicines Loosing wt on diet   Review of Systems  Constitutional: Negative for appetite change, fatigue and unexpected weight change.  HENT: Negative for nosebleeds, congestion, sore throat, sneezing, trouble swallowing and neck pain.   Eyes: Negative for itching and visual disturbance.  Respiratory: Negative for cough.   Cardiovascular: Negative for chest pain, palpitations and leg swelling.  Gastrointestinal: Negative for nausea, diarrhea, blood in stool and abdominal distention.  Genitourinary: Negative for frequency and hematuria.  Musculoskeletal: Negative for back pain, joint swelling and gait problem.  Skin: Negative for rash.  Neurological: Negative for dizziness, tremors, speech difficulty and weakness.  Psychiatric/Behavioral: Negative for suicidal ideas, disturbed wake/sleep cycle, dysphoric mood and agitation. The patient is not nervous/anxious.    Wt Readings from Last 3 Encounters:  10/23/11 296 lb (134.265 kg)  07/23/11 298 lb (135.172 kg)  07/12/11 295 lb (133.811 kg)   BP Readings from Last 3 Encounters:  10/23/11 128/90  07/23/11 120/82  07/12/11 120/80       Objective:   Physical Exam  Constitutional: He is oriented to person, place, and time. He appears well-developed. No distress.       obese  HENT:  Mouth/Throat: Oropharynx is clear and moist.  Eyes: Conjunctivae are normal. Pupils are equal, round, and reactive to light.  Neck: Normal range of motion. No JVD present. No thyromegaly present.  Cardiovascular: Normal rate, regular rhythm, normal heart sounds and intact distal pulses.  Exam reveals no gallop and no friction rub.   No murmur heard. Pulmonary/Chest: Effort normal and breath sounds normal. No respiratory  distress. He has no wheezes. He has no rales. He exhibits no tenderness.  Abdominal: Soft. Bowel sounds are normal. He exhibits no distension and no mass. There is no tenderness. There is no rebound and no guarding.  Musculoskeletal: Normal range of motion. He exhibits no edema and no tenderness.  Lymphadenopathy:    He has no cervical adenopathy.  Neurological: He is alert and oriented to person, place, and time. He has normal reflexes. No cranial nerve deficit. He exhibits normal muscle tone. Coordination normal.  Skin: Skin is warm and dry. No rash noted.  Psychiatric: He has a normal mood and affect. His behavior is normal. Judgment and thought content normal.   Lab Results  Component Value Date   WBC 10.8* 05/07/2011   HGB 16.7 05/07/2011   HCT 47.8 05/07/2011   PLT 152 05/07/2011   GLUCOSE 114* 05/07/2011   CHOL 176 03/23/2010   TRIG 232.0* 03/23/2010   HDL 32.30* 03/23/2010   LDLDIRECT 102.4 03/23/2010   LDLCALC 114* 06/15/2008   ALT 26 05/07/2011   AST 17 05/07/2011   NA 138 05/07/2011   K 4.1 05/07/2011   CL 103 05/07/2011   CREATININE 0.80 05/07/2011   BUN 14 05/07/2011   CO2 25 05/07/2011   TSH 2.39 03/08/2011   PSA 0.25 09/30/2008   INR 1.0 ratio 01/11/2010   HGBA1C 5.6 03/08/2011         Assessment & Plan:

## 2011-10-23 NOTE — Assessment & Plan Note (Signed)
Continue with current prescription therapy as reflected on the Med list.  

## 2011-10-23 NOTE — Assessment & Plan Note (Signed)
Discussed.

## 2011-10-25 ENCOUNTER — Telehealth: Payer: Self-pay | Admitting: *Deleted

## 2011-10-25 DIAGNOSIS — Z0000 Encounter for general adult medical examination without abnormal findings: Secondary | ICD-10-CM

## 2011-10-25 DIAGNOSIS — Z125 Encounter for screening for malignant neoplasm of prostate: Secondary | ICD-10-CM

## 2011-10-25 NOTE — Telephone Encounter (Signed)
Received staff msg made cpx fpr oct. Need labs entered... 10/25/11@11 :47am/LMB

## 2011-10-25 NOTE — Telephone Encounter (Signed)
Message copied by Deatra James on Fri Oct 25, 2011 11:46 AM ------      Message from: Etheleen Sia      Created: Wed Oct 23, 2011  8:15 AM       PHYSICAL LABS FOR LATE OCT

## 2011-12-02 ENCOUNTER — Emergency Department (HOSPITAL_BASED_OUTPATIENT_CLINIC_OR_DEPARTMENT_OTHER)
Admission: EM | Admit: 2011-12-02 | Discharge: 2011-12-02 | Disposition: A | Discharge status: Home / Self Care | Payer: BC Managed Care – PPO | Attending: Emergency Medicine | Admitting: Emergency Medicine

## 2011-12-02 ENCOUNTER — Encounter (HOSPITAL_BASED_OUTPATIENT_CLINIC_OR_DEPARTMENT_OTHER): Payer: Self-pay | Admitting: *Deleted

## 2011-12-02 DIAGNOSIS — H5710 Ocular pain, unspecified eye: Secondary | ICD-10-CM | POA: Insufficient documentation

## 2011-12-02 DIAGNOSIS — H10219 Acute toxic conjunctivitis, unspecified eye: Secondary | ICD-10-CM

## 2011-12-02 DIAGNOSIS — I2510 Atherosclerotic heart disease of native coronary artery without angina pectoris: Secondary | ICD-10-CM | POA: Insufficient documentation

## 2011-12-02 DIAGNOSIS — G4733 Obstructive sleep apnea (adult) (pediatric): Secondary | ICD-10-CM | POA: Insufficient documentation

## 2011-12-02 DIAGNOSIS — F411 Generalized anxiety disorder: Secondary | ICD-10-CM | POA: Insufficient documentation

## 2011-12-02 DIAGNOSIS — I252 Old myocardial infarction: Secondary | ICD-10-CM | POA: Insufficient documentation

## 2011-12-02 DIAGNOSIS — E119 Type 2 diabetes mellitus without complications: Secondary | ICD-10-CM | POA: Insufficient documentation

## 2011-12-02 DIAGNOSIS — F17200 Nicotine dependence, unspecified, uncomplicated: Secondary | ICD-10-CM | POA: Insufficient documentation

## 2011-12-02 DIAGNOSIS — E785 Hyperlipidemia, unspecified: Secondary | ICD-10-CM | POA: Insufficient documentation

## 2011-12-02 DIAGNOSIS — K219 Gastro-esophageal reflux disease without esophagitis: Secondary | ICD-10-CM | POA: Insufficient documentation

## 2011-12-02 MED ORDER — PREDNISOLONE ACETATE 1 % OP SUSP
OPHTHALMIC | Status: AC
  Filled 2011-12-02: qty 5

## 2011-12-02 MED ORDER — PREDNISOLONE ACETATE 1 % OP SUSP
1.0000 [drp] | Freq: Four times a day (QID) | OPHTHALMIC | Status: DC
  Administered 2011-12-02: 1 [drp] via OPHTHALMIC

## 2011-12-02 MED ORDER — TETRACAINE HCL 0.5 % OP SOLN
OPHTHALMIC | Status: AC
  Filled 2011-12-02: qty 2

## 2011-12-02 MED ORDER — FLUORESCEIN SODIUM 1 MG OP STRP
ORAL_STRIP | OPHTHALMIC | Status: AC
  Filled 2011-12-02: qty 1

## 2011-12-02 MED ORDER — PREDNISOLONE ACETATE 1 % OP SUSP: 1.0000 [drp] | Freq: Four times a day (QID) | OPHTHALMIC | Status: DC

## 2011-12-02 MED ORDER — HYDROCODONE-ACETAMINOPHEN 5-325 MG PO TABS: 1.0000 | ORAL_TABLET | Freq: Four times a day (QID) | ORAL | Status: AC | PRN

## 2011-12-02 MED ORDER — HYDROCODONE-ACETAMINOPHEN 5-325 MG PO TABS
2.0000 | ORAL_TABLET | Freq: Once | ORAL | Status: AC
  Administered 2011-12-02: 2 via ORAL
  Filled 2011-12-02: qty 2

## 2011-12-02 NOTE — ED Notes (Signed)
Knee wound care was given after patient explained injury occurred while he was bobbing for pigs feet as he was kneeling.

## 2011-12-02 NOTE — ED Notes (Signed)
Pt. States he has not been taking his b/p meds.

## 2011-12-02 NOTE — ED Notes (Signed)
MD at bedside. 

## 2011-12-02 NOTE — ED Notes (Signed)
Yesterday pt was bobbing for pigs feet (in salt water)  and got something into his right eye. Pt. Presents with redness to his right eye with watering. Rates his pain 7/10. Describes as a burning type pain. Can see but blurred vision. Right eye swollen on exam and difficult for pt to keep open due to irritation.

## 2011-12-02 NOTE — ED Provider Notes (Signed)
History     CSN: [id number]  Arrival date & time 12/02/11  2536   First MD Initiated Contact with Patient 12/02/11 0344      Chief Complaint  Patient presents with  . Eye Pain    (Consider location/radiation/quality/duration/timing/severity/associated sxs/prior treatment) HPI This is a 51 year old white male who was bobbing for pigs feet yesterday afternoon at a fair. The pigs feet were in brine. He was initially wearing goggles but took them off and got brine in his eyes. He subsequently developed pain in the right eye which was not relieved by copious irrigation or over-the-counter Visine. The pain is characterized as a foreign body sensation. He continues to have pain in the right eye, a 7/10 at this time. There is mild blurriness of that eye along with erythema, watering and nasal congestion. As an incidental note he also abraded both knees.   Past Medical History  Diagnosis Date  . Anxiety   . GERD (gastroesophageal reflux disease)   . Hyperlipidemia   . Diabetes mellitus      II, borderline, diet controlled  . 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 CAD (coronary artery disease)   . OSA (obstructive sleep apnea)     cpap -PSG apr30 2009, AHI  119  . Pain, low back   . Tobacco use disorder   . Cocaine abuse     none now  . Obesity, unspecified     Past Surgical History  Procedure Date  . Fracture surgery     R. leg  . Fracture surgery     L. arm  . Back surgery     x 2    Family History  Problem Relation Age of Onset  . Heart attack Father 71    grandfather died MI 61  . Heart disease Father     cad  . Heart attack    . Atrial fibrillation Other   . Cancer Mother     lung ca    History  Substance Use Topics  . Smoking status: Current Everyday Smoker -- 1.5 packs/day for 25 years    Types: Cigarettes  . Smokeless tobacco: Never Used  . Alcohol Use: 6.0 oz/week    12 drink(s) per week      Review of Systems  All  other systems reviewed and are negative.    Allergies  Lisinopril  Home Medications   Current Outpatient Rx  Name Route Sig Dispense Refill  . ALPRAZOLAM 2 MG PO TABS Oral Take 1 tablet (2 mg total) by mouth 3 (three) times daily as needed for anxiety. For anxiety 90 tablet 5  . AZELASTINE-FLUTICASONE 137-50 MCG/ACT NA SUSP Nasal Place 1 Act into the nose 1 day or 1 dose. 23 g 5  . FLUTICASONE-SALMETEROL 250-50 MCG/DOSE IN AEPB Inhalation Inhale 1 puff into the lungs 2 (two) times daily. 60 each 2  . LOSARTAN POTASSIUM 100 MG PO TABS Oral Take 1 tablet (100 mg total) by mouth daily. 90 tablet 3  . NAPROXEN 500 MG PO TABS Oral Take 1 tablet (500 mg total) by mouth 2 (two) times daily with a meal. 90 tablet 2  . NITROGLYCERIN 0.4 MG SL SUBL Sublingual Place 0.4 mg under the tongue every 5 (five) minutes as needed.      [name] TRAMADOL HCL 50 MG PO TABS Oral Take 1-2 tablets (50-100 mg total) by mouth 2 (two) times daily as needed. 90 tablet 5  . TRIAMCINOLONE ACETONIDE  0.5 % EX CREA Topical Apply topically 3 (three) times daily. 90 g 1    BP 182/98  Pulse 95  Temp 97.8 F (36.6 C) (Oral)  Resp 20  SpO2 95%  Physical Exam General: Well-developed, well-nourished male in no acute distress; appearance consistent with age of record HENT: normocephalic, atraumatic; nasal congestion Eyes: pupils equal round and reactive to light; extraocular muscles intact; pH normal bilaterally; right conjunctival injection, lacrimation and mild edema of eyelids; no fluorescein uptake in right cornea Neck: supple Heart: regular rate and rhythm Lungs: Normal respiratory effort and excursion Abdomen: soft; obese Extremities: No deformity; full range of motion Neurologic: Awake, alert and oriented; motor function intact in all extremities and symmetric; no facial droop Skin: Warm and dry; abrasions to knees bilaterally Psychiatric: Normal mood and affect     ED Course  Procedures (including critical care  time)    MDM          Hanley Seamen, MD 12/02/11 [phone]

## 2011-12-02 NOTE — ED Notes (Signed)
I completed wound care by cleaning patient knees with saline and iodine solution, then applying bacitracin and covering with 2x2's, 4x4,'s and kerlix wrap.

## 2012-01-31 ENCOUNTER — Telehealth: Payer: Self-pay | Admitting: *Deleted

## 2012-01-31 NOTE — Telephone Encounter (Signed)
Thurston Hole, Please call Mr [name] to schedule an appointment for next week with me. He has been having chest pain. He is expecting call today. [place]    01/31/12 Spoke with pt. Pt given an appt 02/05/12 with Dr Shirlee Latch.

## 2012-02-05 ENCOUNTER — Ambulatory Visit (INDEPENDENT_AMBULATORY_CARE_PROVIDER_SITE_OTHER): Payer: BC Managed Care – PPO | Admitting: Cardiology

## 2012-02-05 ENCOUNTER — Encounter: Payer: Self-pay | Admitting: *Deleted

## 2012-02-05 ENCOUNTER — Encounter: Payer: Self-pay | Admitting: Cardiology

## 2012-02-05 VITALS — BP 127/82 | HR 84 | Resp 18 | Ht 74.0 in | Wt 298.0 lb

## 2012-02-05 DIAGNOSIS — R079 Chest pain, unspecified: Secondary | ICD-10-CM

## 2012-02-05 DIAGNOSIS — I2510 Atherosclerotic heart disease of native coronary artery without angina pectoris: Secondary | ICD-10-CM

## 2012-02-05 DIAGNOSIS — F17200 Nicotine dependence, unspecified, uncomplicated: Secondary | ICD-10-CM

## 2012-02-05 DIAGNOSIS — I10 Essential (primary) hypertension: Secondary | ICD-10-CM

## 2012-02-05 MED ORDER — BUPROPION HCL ER (SR) 150 MG PO TB12: 150.0000 mg | ORAL_TABLET | Freq: Two times a day (BID) | ORAL | Status: DC

## 2012-02-05 NOTE — Progress Notes (Signed)
Patient ID: [name], male   DOB: 1961/05/04, 51 y.o.   MRN: [id number] PCP: Dr. Posey Rea  51 yo with history of HTN, obesity, nonobstructive CAD, and smoking presents for evaluation of chest pain.  I saw him back in 2010 for chest pain episodes.  He had a Lexiscan myoview in 12/10 showing EF 46% and possible old inferior infarction. ECG had nonspecific T wave changes but no Qs.  Given the abnormal myoview and ongoing chest pain, I decided to take him for left heart cath in 9/11. Left heart cath showed a 30-40% mid RCA stenosis and a 30% mid circumflex stenosis but no obstructive disease.   I have not seen him since that time.  His wife passed away over a year ago from cancer.  He did well symptomatically until about 3-4 months ago when he began to develop chest pain again.  He says that this is considerably worse than prior.  He tends to get central substernal chest tightness in the morning.  He does not have to be doing anything in particular though it is worse when he is under stress.  The pain will radiate to his left arm.  It will last about 5 minutes then resolve as he relaxes.  He does not note chest pain when he walks his dog in the afternoon.  He is getting the chest pain more and more often, now it tends to be daily. He still smokes 2 ppd.  He is not on a statin.  BP has been under control on losartan.  He uses cocaine rarely, and it does not cause chest pain when he uses it.  His brother died with massive MI and cardiac arrest at age 50 and his father had CABG in his early 42s.   He is very concerned that his known coronary stenoses have progressed.   Labs (1/11): K 4.4, creatinine 0.8, HDL 43, LDL 80, TSH normal, LFTs normal, hgbA1c 6.7%  Labs (12/12): K 4.1, creatinine 0.8  ECG: NSR normal  Allergies:  No Known Drug Allergies   Past Medical History:  1. Anxiety  2. 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  Lexiscan myoview (12/10) with EF 46%, diffuse hypokinesis, possible old inferior infarction. Echo (8/11): EF 55%, basal inferior hypokinesis, mild LVH, no significant valve abnormalities. Left heart cath (9/11): 30-40% mid RCA, 30% mid CFX, no obstructive disease. Medical management for mild CAD.  3. GERD  4. Cocaine abuse 5. OSA on CPAP  - PSG September 10 2007, AHI 119  6. Obesity  7. Tobacco abuse: 2 ppd 8. Low back pain  9. Hyperlipidemia: untreated.  10. DM II: Borderline, diet-controlled.   Family History:  Grandfather died of MI at 49, Father had CABG in early 40s, uncle with atrial fibrillation, brother with MI and cardiac arrest at 50 (died).  Social History:  Occupation: Retail banker  Lives in [place]  2 kids.  Widowed (wife died from cancer).  Now living with girlfriend Current Smoker 2 ppd  Alcohol use-yes 12 beers a week  Occasional cocaine, regular marijuana  ROS: All systems reviewed and negative except as per HPI.   Current Outpatient Prescriptions  Medication Sig Dispense Refill  . alprazolam (XANAX) 2 MG tablet Take 1 tablet (2 mg total) by mouth 3 (three) times daily as needed for anxiety. For anxiety  90 tablet  5  . Fluticasone-Salmeterol (ADVAIR DISKUS) 250-50 MCG/DOSE AEPB Inhale 1 puff  into the lungs 2 (two) times daily.  60 each  2  . losartan (COZAAR) 100 MG tablet Take 1 tablet (100 mg total) by mouth daily.  90 tablet  3  . naproxen (NAPROSYN) 500 MG tablet Take 1 tablet (500 mg total) by mouth 2 (two) times daily with a meal.  90 tablet  2  . traMADol (ULTRAM) 50 MG tablet Take 1-2 tablets (50-100 mg total) by mouth 2 (two) times daily as needed.  90 tablet  5  . aspirin EC 81 MG tablet Take 1 tablet (81 mg total) by mouth daily.      . Azelastine-Fluticasone (DYMISTA) 137-50 MCG/ACT SUSP Place 1 Act into the nose 1 day or 1 dose.  23 g  5  . buPROPion (WELLBUTRIN SR) 150 MG 12 hr tablet Take 1 tablet (150 mg total) by mouth 2 (two) times daily.  60 tablet  2   . nitroGLYCERIN (NITROSTAT) 0.4 MG SL tablet Place 0.4 mg under the tongue every 5 (five) minutes as needed.        . triamcinolone cream (KENALOG) 0.5 % Apply topically 3 (three) times daily.  90 g  1   Current Facility-Administered Medications  Medication Dose Route Frequency Provider Last Rate Last Dose  . albuterol (PROVENTIL) (2.5 MG/3ML) 0.083% nebulizer solution 2.5 mg  2.5 mg Nebulization Once Georgina Quint Plotnikov, MD        BP 127/82  Pulse 84  Resp 18  Ht 6\' 2"  (1.88 m)  Wt 298 lb (135.172 kg)  BMI 38.26 kg/m2 General: NAD, obese Neck: No JVD, no thyromegaly or thyroid nodule.  Lungs: Clear to auscultation bilaterally with normal respiratory effort. CV: Nondisplaced PMI.  Heart regular S1/S2, no S3/S4, no murmur.  No peripheral edema.  No carotid bruit.  Normal pedal pulses.  Abdomen: Soft, nontender, no hepatosplenomegaly, no distention.  Neurologic: Alert and oriented x 3.  Psych: Normal affect. Extremities: No clubbing or cyanosis.   Assessment/Plan: 1. Chest pain: I am very concerned about his chest pain radiating to the left arm.  This began about 3 months ago and has been progressive.  He smokes 2 ppd.  He has a strong family history of premature CAD.  He has known nonobstructive CAD on prior cath.  We discussed cath versus stress test.  He prefers definitive evaluation, which would be cath.  I agree with this approach, especially given his body habitus (large abdomen) that lends itself to inferior perfusion defect on myoview (had inferior defect on last myoview).   - Start ASA 81 mg daily.  - LHC, will use radial approach.  - check lipids: he will need a statin given known CAD, but will decide on which statin and what strength based on lipid profile.   2. Smoking: Needs to quit.  I strongly encouraged him to work on stopping.  He will start wellbutrin to try to aid in smoking cessation.  3. HTN: BP is controlled.  4. Cocaine abuse: I strongly encouraged him to quit.    Marca Ancona 02/05/2012 5:34 PM

## 2012-02-05 NOTE — Patient Instructions (Addendum)
Start aspirin 81mg  daily. Take this with food.   Start welbutrin to help you stop smoking. Take welbutrin 150mg  daily for 3 days, then increase to 150mg  two times a day. You can take this for a total of 3months.  Your physician recommends that you return for a FASTING lipid profile /BMET/CBCd/PT/INR. You should have this done tomorrow or Friday.  Your physician has requested that you have a cardiac catheterization. Cardiac catheterization is used to diagnose and/or treat various heart conditions. Doctors may recommend this procedure for a number of different reasons. The most common reason is to evaluate chest pain. Chest pain can be a symptom of coronary artery disease (CAD), and cardiac catheterization can show whether plaque is narrowing or blocking your heart's arteries. This procedure is also used to evaluate the valves, as well as measure the blood flow and oxygen levels in different parts of your heart. For further information please visit [email or web address] Please follow instruction sheet, as given.  Dr Shirlee Latch will let you know when you need to see him again after you have  the cardiac catheterization.

## 2012-02-06 ENCOUNTER — Other Ambulatory Visit (INDEPENDENT_AMBULATORY_CARE_PROVIDER_SITE_OTHER): Payer: BC Managed Care – PPO

## 2012-02-06 DIAGNOSIS — I2510 Atherosclerotic heart disease of native coronary artery without angina pectoris: Secondary | ICD-10-CM

## 2012-02-06 DIAGNOSIS — R079 Chest pain, unspecified: Secondary | ICD-10-CM

## 2012-02-06 LAB — CBC WITH DIFFERENTIAL/PLATELET
Basophils Absolute: 0 10*3/uL (ref 0.0–0.1)
Eosinophils Relative: 3.1 % (ref 0.0–5.0)
HCT: 46.1 % (ref 39.0–52.0)
Lymphs Abs: 2.1 10*3/uL (ref 0.7–4.0)
MCV: 91.7 fl (ref 78.0–100.0)
Monocytes Absolute: 0.7 10*3/uL (ref 0.1–1.0)
Neutro Abs: 4.4 10*3/uL (ref 1.4–7.7)
Platelets: 161 10*3/uL (ref 150.0–400.0)
RDW: 13 % (ref 11.5–14.6)

## 2012-02-06 LAB — BASIC METABOLIC PANEL
Calcium: 9 mg/dL (ref 8.4–10.5)
Creatinine, Ser: 0.9 mg/dL (ref 0.4–1.5)
GFR: 90.37 mL/min (ref >60.00)
Glucose, Bld: 90 mg/dL (ref 70–99)
Sodium: 141 mEq/L (ref 135–145)

## 2012-02-06 LAB — LIPID PANEL
Cholesterol: 173 mg/dL (ref 0–200)
HDL: 34.6 mg/dL — ABNORMAL LOW (ref >39.00)
LDL Cholesterol: 109 mg/dL — ABNORMAL HIGH (ref 0–99)
Triglycerides: 149 mg/dL (ref 0.0–149.0)
VLDL: 29.8 mg/dL (ref 0.0–40.0)

## 2012-02-10 ENCOUNTER — Other Ambulatory Visit: Payer: Self-pay | Admitting: Cardiology

## 2012-02-10 ENCOUNTER — Other Ambulatory Visit: Payer: Self-pay | Admitting: *Deleted

## 2012-02-10 DIAGNOSIS — R079 Chest pain, unspecified: Secondary | ICD-10-CM

## 2012-02-10 DIAGNOSIS — I2510 Atherosclerotic heart disease of native coronary artery without angina pectoris: Secondary | ICD-10-CM

## 2012-02-10 MED ORDER — ATORVASTATIN CALCIUM 20 MG PO TABS: 20.0000 mg | ORAL_TABLET | Freq: Every day | ORAL | Status: DC

## 2012-02-11 ENCOUNTER — Inpatient Hospital Stay (HOSPITAL_BASED_OUTPATIENT_CLINIC_OR_DEPARTMENT_OTHER)
Admission: RE | Admit: 2012-02-11 | Discharge: 2012-02-11 | Disposition: A | Discharge status: Home / Self Care | Payer: BC Managed Care – PPO | Source: Ambulatory Visit | Attending: Cardiology | Admitting: Cardiology

## 2012-02-11 ENCOUNTER — Encounter (HOSPITAL_BASED_OUTPATIENT_CLINIC_OR_DEPARTMENT_OTHER)
Admission: RE | Disposition: A | Discharge status: Home / Self Care | Payer: Self-pay | Source: Ambulatory Visit | Attending: Cardiology

## 2012-02-11 DIAGNOSIS — R9439 Abnormal result of other cardiovascular function study: Secondary | ICD-10-CM | POA: Insufficient documentation

## 2012-02-11 DIAGNOSIS — R079 Chest pain, unspecified: Secondary | ICD-10-CM | POA: Insufficient documentation

## 2012-02-11 DIAGNOSIS — I10 Essential (primary) hypertension: Secondary | ICD-10-CM | POA: Insufficient documentation

## 2012-02-11 DIAGNOSIS — E669 Obesity, unspecified: Secondary | ICD-10-CM | POA: Insufficient documentation

## 2012-02-11 DIAGNOSIS — F1410 Cocaine abuse, uncomplicated: Secondary | ICD-10-CM | POA: Insufficient documentation

## 2012-02-11 DIAGNOSIS — I2510 Atherosclerotic heart disease of native coronary artery without angina pectoris: Secondary | ICD-10-CM

## 2012-02-11 DIAGNOSIS — F17200 Nicotine dependence, unspecified, uncomplicated: Secondary | ICD-10-CM | POA: Insufficient documentation

## 2012-02-11 SURGERY — JV LEFT HEART CATHETERIZATION WITH CORONARY ANGIOGRAM

## 2012-02-11 MED ORDER — ACETAMINOPHEN 325 MG PO TABS: 650.0000 mg | ORAL_TABLET | ORAL | Status: DC | PRN

## 2012-02-11 MED ORDER — SODIUM CHLORIDE 0.9 % IJ SOLN: 3.0000 mL | Freq: Two times a day (BID) | INTRAMUSCULAR | Status: DC

## 2012-02-11 MED ORDER — SODIUM CHLORIDE 0.9 % IV SOLN: INTRAVENOUS | Status: DC

## 2012-02-11 MED ORDER — SODIUM CHLORIDE 0.9 % IV SOLN: 250.0000 mL | INTRAVENOUS | Status: DC | PRN

## 2012-02-11 MED ORDER — ASPIRIN 81 MG PO CHEW
324.0000 mg | CHEWABLE_TABLET | ORAL | Status: AC
  Administered 2012-02-11: 324 mg via ORAL

## 2012-02-11 MED ORDER — ONDANSETRON HCL 4 MG/2ML IJ SOLN: 4.0000 mg | Freq: Four times a day (QID) | INTRAMUSCULAR | Status: DC | PRN

## 2012-02-11 MED ORDER — SODIUM CHLORIDE 0.9 % IV SOLN
INTRAVENOUS | Status: DC
  Administered 2012-02-11: 10:00:00 via INTRAVENOUS

## 2012-02-11 MED ORDER — SODIUM CHLORIDE 0.9 % IJ SOLN: 3.0000 mL | INTRAMUSCULAR | Status: DC | PRN

## 2012-02-11 NOTE — Interval H&P Note (Signed)
History and Physical Interval Note:  02/11/2012 10:30 AM  [name]  has presented today for surgery, with the diagnosis of cp  The various methods of treatment have been discussed with the patient and family. After consideration of risks, benefits and other options for treatment, the patient has consented to  Procedure(s) (LRB) with comments: JV LEFT HEART CATHETERIZATION WITH CORONARY ANGIOGRAM (N/A) as a surgical intervention .  The patient's history has been reviewed, patient examined, no change in status, stable for surgery.  I have reviewed the patient's chart and labs.  Questions were answered to the patient's satisfaction.     Shariya Gaster [employer]

## 2012-02-11 NOTE — H&P (View-Only) (Signed)
Patient ID: [name], male   DOB: 10/13/1960, 51 y.o.   MRN: [id number] PCP: Dr. Plotnikov  51 yo with history of HTN, obesity, nonobstructive CAD, and smoking presents for evaluation of chest pain.  I saw him back in 2010 for chest pain episodes.  He had a Lexiscan myoview in 12/10 showing EF 46% and possible old inferior infarction. ECG had nonspecific T wave changes but no Qs.  Given the abnormal myoview and ongoing chest pain, I decided to take him for left heart cath in 9/11. Left heart cath showed a 30-40% mid RCA stenosis and a 30% mid circumflex stenosis but no obstructive disease.   I have not seen him since that time.  His wife passed away over a year ago from cancer.  He did well symptomatically until about 3-4 months ago when he began to develop chest pain again.  He says that this is considerably worse than prior.  He tends to get central substernal chest tightness in the morning.  He does not have to be doing anything in particular though it is worse when he is under stress.  The pain will radiate to his left arm.  It will last about 5 minutes then resolve as he relaxes.  He does not note chest pain when he walks his dog in the afternoon.  He is getting the chest pain more and more often, now it tends to be daily. He still smokes 2 ppd.  He is not on a statin.  BP has been under control on losartan.  He uses cocaine rarely, and it does not cause chest pain when he uses it.  His brother died with massive MI and cardiac arrest at age 52 and his father had CABG in his early 50s.   He is very concerned that his known coronary stenoses have progressed.   Labs (1/11): K 4.4, creatinine 0.8, HDL 43, LDL 80, TSH normal, LFTs normal, hgbA1c 6.7%  Labs (12/12): K 4.1, creatinine 0.8  ECG: NSR normal  Allergies:  No Known Drug Allergies   Past Medical History:  1. Anxiety  2. 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  Lexiscan myoview (12/10) with EF 46%, diffuse hypokinesis, possible old inferior infarction. Echo (8/11): EF 55%, basal inferior hypokinesis, mild LVH, no significant valve abnormalities. Left heart cath (9/11): 30-40% mid RCA, 30% mid CFX, no obstructive disease. Medical management for mild CAD.  3. GERD  4. Cocaine abuse 5. OSA on CPAP  - PSG September 10 2007, AHI 119  6. Obesity  7. Tobacco abuse: 2 ppd 8. Low back pain  9. Hyperlipidemia: untreated.  10. DM II: Borderline, diet-controlled.   Family History:  Grandfather died of MI at 56, Father had CABG in early 50s, uncle with atrial fibrillation, brother with MI and cardiac arrest at 52 (died).  Social History:  Occupation: car sales  Lives in [place]  2 kids.  Widowed (wife died from cancer).  Now living with girlfriend Current Smoker 2 ppd  Alcohol use-yes 12 beers a week  Occasional cocaine, regular marijuana  ROS: All systems reviewed and negative except as per HPI.   Current Outpatient Prescriptions  Medication Sig Dispense Refill  . alprazolam (XANAX) 2 MG tablet Take 1 tablet (2 mg total) by mouth 3 (three) times daily as needed for anxiety. For anxiety  90 tablet  5  . Fluticasone-Salmeterol (ADVAIR DISKUS) 250-50 MCG/DOSE AEPB Inhale 1 puff   into the lungs 2 (two) times daily.  60 each  2  . losartan (COZAAR) 100 MG tablet Take 1 tablet (100 mg total) by mouth daily.  90 tablet  3  . naproxen (NAPROSYN) 500 MG tablet Take 1 tablet (500 mg total) by mouth 2 (two) times daily with a meal.  90 tablet  2  . traMADol (ULTRAM) 50 MG tablet Take 1-2 tablets (50-100 mg total) by mouth 2 (two) times daily as needed.  90 tablet  5  . aspirin EC 81 MG tablet Take 1 tablet (81 mg total) by mouth daily.      . Azelastine-Fluticasone (DYMISTA) 137-50 MCG/ACT SUSP Place 1 Act into the nose 1 day or 1 dose.  23 g  5  . buPROPion (WELLBUTRIN SR) 150 MG 12 hr tablet Take 1 tablet (150 mg total) by mouth 2 (two) times daily.  60 tablet  2   . nitroGLYCERIN (NITROSTAT) 0.4 MG SL tablet Place 0.4 mg under the tongue every 5 (five) minutes as needed.        . triamcinolone cream (KENALOG) 0.5 % Apply topically 3 (three) times daily.  90 g  1   Current Facility-Administered Medications  Medication Dose Route Frequency Provider Last Rate Last Dose  . albuterol (PROVENTIL) (2.5 MG/3ML) 0.083% nebulizer solution 2.5 mg  2.5 mg Nebulization Once Aleksei V Plotnikov, MD        BP 127/82  Pulse 84  Resp 18  Ht 6' 2" (1.88 m)  Wt 298 lb (135.172 kg)  BMI 38.26 kg/m2 General: NAD, obese Neck: No JVD, no thyromegaly or thyroid nodule.  Lungs: Clear to auscultation bilaterally with normal respiratory effort. CV: Nondisplaced PMI.  Heart regular S1/S2, no S3/S4, no murmur.  No peripheral edema.  No carotid bruit.  Normal pedal pulses.  Abdomen: Soft, nontender, no hepatosplenomegaly, no distention.  Neurologic: Alert and oriented x 3.  Psych: Normal affect. Extremities: No clubbing or cyanosis.   Assessment/Plan: 1. Chest pain: I am very concerned about his chest pain radiating to the left arm.  This began about 3 months ago and has been progressive.  He smokes 2 ppd.  He has a strong family history of premature CAD.  He has known nonobstructive CAD on prior cath.  We discussed cath versus stress test.  He prefers definitive evaluation, which would be cath.  I agree with this approach, especially given his body habitus (large abdomen) that lends itself to inferior perfusion defect on myoview (had inferior defect on last myoview).   - Start ASA 81 mg daily.  - LHC, will use radial approach.  - check lipids: he will need a statin given known CAD, but will decide on which statin and what strength based on lipid profile.   2. Smoking: Needs to quit.  I strongly encouraged him to work on stopping.  He will start wellbutrin to try to aid in smoking cessation.  3. HTN: BP is controlled.  4. Cocaine abuse: I strongly encouraged him to quit.    Rena Hunke 02/05/2012 5:34 PM     

## 2012-02-11 NOTE — Procedures (Signed)
   Cardiac Catheterization Procedure Note  Name: [name] MRN: [id number] DOB: Sep 29, 1960  Procedure: Left Heart Cath, Selective Coronary Angiography, LV angiography  Indication: Chest pain, known CAD.    Procedural Details: The right wrist was prepped, draped, and anesthetized with 1% lidocaine. Using the modified Seldinger technique, a 5 French sheath was introduced into the right radial artery. 3 mg of verapamil was administered through the sheath, weight-based unfractionated heparin was administered intravenously. Standard Judkins catheters were used for selective coronary angiography and left ventriculography. Catheter exchanges were performed over an exchange length guidewire. There were no immediate procedural complications. A TR band was used for radial hemostasis at the completion of the procedure.  The patient was transferred to the post catheterization recovery area for further monitoring.  Procedural Findings: Hemodynamics: AO 121/96 LV 119/13  Coronary angiography: Coronary dominance: right  Left mainstem: Short, no significant disease.  Left anterior descending (LAD): 30% proximal LAD stenosis.  Moderate 1st diagonal with 40-50% ostial stenosis.   Left circumflex (LCx): 40% mid LCx stenosis at takeoff of OM1 and OM2.   Right coronary artery (RCA): 60-70% mid RCA stenosis.   Left ventriculography: Left ventricular systolic function is normal, LVEF is estimated at 60-65%, there is no significant mitral regurgitation   Final Conclusions:  There is moderate mid RCA stenosis, 60-70%.  Good flow to distal RCA.  This should not be causing pain at rest or with mild exertion.  However, in the setting of smoking, he certainly could be getting some overlying vasospasm with chest pain.   Recommendations: Medical management for now.  Stop smoking.  Atorvastatin 20 mg daily and will start metoprolol 25 mg bid for anginal symptoms.  I will see him back in 2 wks.  If he  continues to have chest pain, could revisit PCI.   Marca Ancona 02/11/2012, 11:07 AM

## 2012-02-11 NOTE — Progress Notes (Signed)
TR band removed, site level 0.  Tegaderm dressing and wrist immobilizer applied to right hand.  Discharged to home.

## 2012-02-18 ENCOUNTER — Telehealth: Payer: Self-pay | Admitting: *Deleted

## 2012-02-18 NOTE — Telephone Encounter (Signed)
Message from pharmacy. Pt has a prescription for Metoprolol is it tarte or succ or if hes suppose to be taking it. I dont see Metoprolol on med list . Will route this to Margorie John for further investigation  Number is [phone]  [facility]

## 2012-02-19 ENCOUNTER — Encounter (HOSPITAL_BASED_OUTPATIENT_CLINIC_OR_DEPARTMENT_OTHER): Payer: Self-pay

## 2012-02-19 ENCOUNTER — Inpatient Hospital Stay (HOSPITAL_BASED_OUTPATIENT_CLINIC_OR_DEPARTMENT_OTHER): Admit: 2012-02-19 | Payer: Self-pay | Admitting: Cardiology

## 2012-02-19 SURGERY — JV LEFT HEART CATHETERIZATION WITH CORONARY ANGIOGRAM
Anesthesia: Moderate Sedation

## 2012-02-19 MED ORDER — METOPROLOL TARTRATE 25 MG PO TABS: 25.0000 mg | ORAL_TABLET | Freq: Two times a day (BID) | ORAL | Status: DC

## 2012-02-19 NOTE — Telephone Encounter (Signed)
Tartrate. ----- Message ----- From: Jacqlyn Krauss, RN Sent: 02/19/2012 7:10 AM To: Laurey Morale, MD Just wanted to be sure he should get metoprolol tartrate 25mg  bid not metoprolol succinate 25mg  bid. Thurston Hole

## 2012-02-28 ENCOUNTER — Other Ambulatory Visit: Payer: Self-pay | Admitting: *Deleted

## 2012-02-28 DIAGNOSIS — R079 Chest pain, unspecified: Secondary | ICD-10-CM

## 2012-02-28 DIAGNOSIS — I2510 Atherosclerotic heart disease of native coronary artery without angina pectoris: Secondary | ICD-10-CM

## 2012-02-28 MED ORDER — BUPROPION HCL ER (SR) 150 MG PO TB12: 150.0000 mg | ORAL_TABLET | Freq: Two times a day (BID) | ORAL | Status: DC

## 2012-03-03 ENCOUNTER — Encounter: Payer: Self-pay | Admitting: Cardiology

## 2012-03-03 ENCOUNTER — Ambulatory Visit (INDEPENDENT_AMBULATORY_CARE_PROVIDER_SITE_OTHER): Payer: BC Managed Care – PPO | Admitting: Cardiology

## 2012-03-03 VITALS — BP 138/73 | HR 75 | Ht 74.0 in | Wt 303.0 lb

## 2012-03-03 DIAGNOSIS — E785 Hyperlipidemia, unspecified: Secondary | ICD-10-CM

## 2012-03-03 DIAGNOSIS — F17200 Nicotine dependence, unspecified, uncomplicated: Secondary | ICD-10-CM

## 2012-03-03 DIAGNOSIS — I2510 Atherosclerotic heart disease of native coronary artery without angina pectoris: Secondary | ICD-10-CM

## 2012-03-03 DIAGNOSIS — I10 Essential (primary) hypertension: Secondary | ICD-10-CM

## 2012-03-03 MED ORDER — BUPROPION HCL ER (SMOKING DET) 150 MG PO TB12: 150.0000 mg | ORAL_TABLET | Freq: Two times a day (BID) | ORAL | Status: DC

## 2012-03-03 NOTE — Patient Instructions (Addendum)
Keep your fasting lab appointment that you already have scheduled for December 3,2013.  Your physician wants you to follow-up in: 6 months with Dr Shirlee Latch. (April 2014). You will receive a reminder letter in the mail two months in advance. If you don't receive a letter, please call our office to schedule the follow-up appointment.

## 2012-03-03 NOTE — Progress Notes (Signed)
Patient ID: Kupono Marling, male   DOB: 12-01-1960, 51 y.o.   MRN: [id number] PCP: Dr. Posey Rea  51 yo with history of HTN, obesity, nonobstructive CAD, and smoking presents for evaluation of chest pain.  I saw him back in 2010 for chest pain episodes.  He had a Lexiscan myoview in 12/10 showing EF 46% and possible old inferior infarction. ECG had nonspecific T wave changes but no Qs.  Given the abnormal myoview and ongoing chest pain, I decided to take him for left heart cath in 9/11. Left heart cath showed a 30-40% mid RCA stenosis and a 30% mid circumflex stenosis but no obstructive disease.   I saw Mr Allende recently and he reported progressive chest tightness, with and without exertion.  His brother died of an MI in his early 7s recently.  Patient continued to smoke heavily.  I took him for left heart cath in 10/13.  This showed a progressive 60-70% mid RCA stenosis.  It is possible that vasospasm in this area associated with heavy nicotine use could have caused his chest pain.  I planned medical management and started him on metoprolol 25 mg bid.  Since then, he has had only rare mild chest pain.  He is still smoking (has not been able to pick up Wellbutrin yet.  He is not using cocaine.   Labs (1/11): K 4.4, creatinine 0.8, HDL 43, LDL 80, TSH normal, LFTs normal, hgbA1c 6.7%  Labs (12/12): K 4.1, creatinine 0.8  ECG: NSR normal  Allergies:  No Known Drug Allergies   Past Medical History:  1. Anxiety  2. 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 Lexiscan myoview (12/10) with EF 46%, diffuse hypokinesis, possible old inferior infarction. Echo (8/11): EF 55%, basal inferior hypokinesis, mild LVH, no significant valve abnormalities. Left heart cath (9/11): 30-40% mid RCA, 30% mid CFX, no obstructive disease. Medical management for mild CAD. LHC (10/13) with 60-70% mRCA stenosis, 40-50% ostial D1 stenosis.  3. GERD  4.  Cocaine abuse 5. OSA on CPAP  - PSG September 10 2007, AHI 119  6. Obesity  7. Tobacco abuse: 2 ppd 8. Low back pain  9. Hyperlipidemia: untreated.  10. DM II: Borderline, diet-controlled.   Family History:  Grandfather died of MI at 9, Father had CABG in early 20s, uncle with atrial fibrillation, brother with MI and cardiac arrest at 32 (died).  Social History:  Occupation: Retail banker  Lives in [place]  2 kids.  Widowed (wife died from cancer).  Now living with girlfriend Current Smoker 2 ppd  Alcohol use-yes 12 beers a week  Occasional cocaine, regular marijuana   Current Outpatient Prescriptions  Medication Sig Dispense Refill  . alprazolam (XANAX) 2 MG tablet Take 1 tablet (2 mg total) by mouth 3 (three) times daily as needed for anxiety. For anxiety  90 tablet  5  . aspirin EC 81 MG tablet Take 1 tablet (81 mg total) by mouth daily.      [name] atorvastatin (LIPITOR) 20 MG tablet Take 1 tablet (20 mg total) by mouth daily.  30 tablet  3  . Azelastine-Fluticasone (DYMISTA) 137-50 MCG/ACT SUSP Place 1 Act into the nose 1 day or 1 dose.  23 g  5  . Fluticasone-Salmeterol (ADVAIR DISKUS) 250-50 MCG/DOSE AEPB Inhale 1 puff into the lungs 2 (two) times daily.  60 each  2  . losartan (COZAAR) 100 MG tablet Take 1 tablet (100 mg  total) by mouth daily.  90 tablet  3  . naproxen (NAPROSYN) 500 MG tablet Take 1 tablet (500 mg total) by mouth 2 (two) times daily with a meal.  90 tablet  2  . nitroGLYCERIN (NITROSTAT) 0.4 MG SL tablet Place 0.4 mg under the tongue every 5 (five) minutes as needed.        . traMADol (ULTRAM) 50 MG tablet Take 1-2 tablets (50-100 mg total) by mouth 2 (two) times daily as needed.  90 tablet  5  . triamcinolone cream (KENALOG) 0.5 % Apply topically 3 (three) times daily.  90 g  1  . buPROPion (ZYBAN) 150 MG 12 hr tablet Take 1 tablet (150 mg total) by mouth 2 (two) times daily.  60 tablet  2  . metoprolol tartrate (LOPRESSOR) 25 MG tablet Take 1 tablet (25 mg total)  by mouth 2 (two) times daily.  60 tablet  6   Current Facility-Administered Medications  Medication Dose Route Frequency Provider Last Rate Last Dose  . albuterol (PROVENTIL) (2.5 MG/3ML) 0.083% nebulizer solution 2.5 mg  2.5 mg Nebulization Once Georgina Quint Plotnikov, MD        BP 138/73  Pulse 75  Ht 6\' 2"  (1.88 m)  Wt 303 lb (137.44 kg)  BMI 38.90 kg/m2 General: NAD, obese Neck: No JVD, no thyromegaly or thyroid nodule.  Lungs: Clear to auscultation bilaterally with normal respiratory effort. CV: Nondisplaced PMI.  Heart regular S1/S2, no S3/S4, no murmur.  No peripheral edema.  No carotid bruit.  Normal pedal pulses.  Abdomen: Soft, nontender, no hepatosplenomegaly, no distention.  Neurologic: Alert and oriented x 3.  Psych: Normal affect. Extremities: No clubbing or cyanosis. Right radial cath site benign.   Assessment/Plan: 1. CAD: Moderate CAD on cath with progression of mid RCA lesion.  It is possible that heavy nicotine use could lead to vasospasm in the area of the 60-70% RCA stenosis, leading to chest pain.  He is doing better on metoprolol with much less chest pain.  - Continue ASA, metoprolol 25 mg bid, atorvastatin 20, losartan.   2. Smoking: Needs to quit.  I strongly encouraged him to work on stopping.  He will start wellbutrin to try to aid in smoking cessation (plans to pick up today).  3. HTN: BP is controlled.  4. Cocaine abuse: He says he has quit.  5. Hyperlipidemia: Goal LDL < 70.  Check lipids/LFTs in 12/13.   Marca Ancona 03/03/2012 5:04 PM

## 2012-03-09 ENCOUNTER — Ambulatory Visit (INDEPENDENT_AMBULATORY_CARE_PROVIDER_SITE_OTHER): Payer: BC Managed Care – PPO | Admitting: Internal Medicine

## 2012-03-09 ENCOUNTER — Other Ambulatory Visit: Payer: Self-pay | Admitting: *Deleted

## 2012-03-09 ENCOUNTER — Encounter: Payer: Self-pay | Admitting: Internal Medicine

## 2012-03-09 VITALS — BP 120/64 | HR 68 | Temp 98.5°F | Resp 16 | Ht 72.0 in | Wt 303.0 lb

## 2012-03-09 DIAGNOSIS — F17200 Nicotine dependence, unspecified, uncomplicated: Secondary | ICD-10-CM

## 2012-03-09 DIAGNOSIS — K047 Periapical abscess without sinus: Secondary | ICD-10-CM

## 2012-03-09 DIAGNOSIS — I2510 Atherosclerotic heart disease of native coronary artery without angina pectoris: Secondary | ICD-10-CM

## 2012-03-09 DIAGNOSIS — M545 Low back pain: Secondary | ICD-10-CM

## 2012-03-09 DIAGNOSIS — Z23 Encounter for immunization: Secondary | ICD-10-CM

## 2012-03-09 DIAGNOSIS — G4733 Obstructive sleep apnea (adult) (pediatric): Secondary | ICD-10-CM

## 2012-03-09 HISTORY — DX: Periapical abscess without sinus: K04.7

## 2012-03-09 MED ORDER — ALPRAZOLAM 2 MG PO TABS: 2.0000 mg | ORAL_TABLET | Freq: Three times a day (TID) | ORAL | Status: DC | PRN

## 2012-03-09 MED ORDER — AMOXICILLIN 500 MG PO CAPS: 1000.0000 mg | ORAL_CAPSULE | Freq: Two times a day (BID) | ORAL | Status: DC

## 2012-03-09 MED ORDER — TRAMADOL HCL 50 MG PO TABS: 50.0000 mg | ORAL_TABLET | Freq: Two times a day (BID) | ORAL | Status: DC | PRN

## 2012-03-09 NOTE — Assessment & Plan Note (Signed)
Continue with current prescription therapy as reflected on the Med list.  

## 2012-03-09 NOTE — Assessment & Plan Note (Signed)
Discussed.

## 2012-03-09 NOTE — Assessment & Plan Note (Signed)
Discussed diet  

## 2012-03-09 NOTE — Assessment & Plan Note (Signed)
left heart cath in 9/11: Left heart cath showed a 30-40% mid RCA stenosis and a 30% mid circumflex stenosis but no obstructive disease.  Continue with current prescription therapy as reflected on the Med list. D/c smoking

## 2012-03-09 NOTE — Progress Notes (Signed)
   Subjective:    Patient ID: [name], male    DOB: Dec 06, 1960, 51 y.o.   MRN: [id number]  HPI   The patient is here to follow up on chronic HTN, anxiety, elev glu and chronic moderate LBP symptoms controlled with medicines. His brother died of a MI at 66. C/o memory issues lately, stressed Not loosing wt lately   Review of Systems  Constitutional: Negative for appetite change, fatigue and unexpected weight change.  HENT: Negative for nosebleeds, congestion, sore throat, sneezing, trouble swallowing and neck pain.   Eyes: Negative for itching and visual disturbance.  Respiratory: Negative for cough.   Cardiovascular: Negative for chest pain, palpitations and leg swelling.  Gastrointestinal: Negative for nausea, diarrhea, blood in stool and abdominal distention.  Genitourinary: Negative for frequency and hematuria.  Musculoskeletal: Negative for back pain, joint swelling and gait problem.  Skin: Negative for rash.  Neurological: Negative for dizziness, tremors, speech difficulty and weakness.  Psychiatric/Behavioral: Negative for suicidal ideas, disturbed wake/sleep cycle, dysphoric mood and agitation. The patient is not nervous/anxious.    Wt Readings from Last 3 Encounters:  03/09/12 303 lb (137.44 kg)  03/03/12 303 lb (137.44 kg)  02/11/12 298 lb (135.172 kg)   BP Readings from Last 3 Encounters:  03/09/12 120/64  03/03/12 138/73  02/11/12 138/85       Objective:   Physical Exam  Constitutional: He is oriented to person, place, and time. He appears well-developed. No distress.       obese  HENT:  Mouth/Throat: Oropharynx is clear and moist.  Eyes: Conjunctivae normal are normal. Pupils are equal, round, and reactive to light.  Neck: Normal range of motion. No JVD present. No thyromegaly present.  Cardiovascular: Normal rate, regular rhythm, normal heart sounds and intact distal pulses.  Exam reveals no gallop and no friction rub.   No murmur  heard. Pulmonary/Chest: Effort normal and breath sounds normal. No respiratory distress. He has no wheezes. He has no rales. He exhibits no tenderness.  Abdominal: Soft. Bowel sounds are normal. He exhibits no distension and no mass. There is no tenderness. There is no rebound and no guarding.  Musculoskeletal: Normal range of motion. He exhibits no edema and no tenderness.  Lymphadenopathy:    He has no cervical adenopathy.  Neurological: He is alert and oriented to person, place, and time. He has normal reflexes. No cranial nerve deficit. He exhibits normal muscle tone. Coordination normal.  Skin: Skin is warm and dry. No rash noted.  Psychiatric: He has a normal mood and affect. His behavior is normal. Judgment and thought content normal.   Lab Results  Component Value Date   WBC 7.4 02/06/2012   HGB 15.4 02/06/2012   HCT 46.1 02/06/2012   PLT 161.0 02/06/2012   GLUCOSE 90 02/06/2012   CHOL 173 02/06/2012   TRIG 149.0 02/06/2012   HDL 34.60* 02/06/2012   LDLDIRECT 102.4 03/23/2010   LDLCALC 109* 02/06/2012   ALT 26 05/07/2011   AST 17 05/07/2011   NA 141 02/06/2012   K 4.7 02/06/2012   CL 107 02/06/2012   CREATININE 0.9 02/06/2012   BUN 19 02/06/2012   CO2 27 02/06/2012   TSH 2.39 03/08/2011   PSA 0.25 09/30/2008   INR 1.1* 02/06/2012   HGBA1C 5.6 03/08/2011         Assessment & Plan:

## 2012-03-09 NOTE — Assessment & Plan Note (Signed)
Start abx

## 2012-04-14 ENCOUNTER — Other Ambulatory Visit: Payer: BC Managed Care – PPO

## 2012-05-28 ENCOUNTER — Encounter: Payer: Self-pay | Admitting: Internal Medicine

## 2012-05-28 ENCOUNTER — Telehealth: Payer: Self-pay | Admitting: *Deleted

## 2012-05-28 ENCOUNTER — Other Ambulatory Visit (INDEPENDENT_AMBULATORY_CARE_PROVIDER_SITE_OTHER): Payer: BC Managed Care – PPO

## 2012-05-28 ENCOUNTER — Ambulatory Visit (INDEPENDENT_AMBULATORY_CARE_PROVIDER_SITE_OTHER): Payer: BC Managed Care – PPO | Admitting: Internal Medicine

## 2012-05-28 VITALS — BP 126/72 | HR 86 | Temp 97.9°F | Ht 72.0 in | Wt 300.0 lb

## 2012-05-28 DIAGNOSIS — Z0000 Encounter for general adult medical examination without abnormal findings: Secondary | ICD-10-CM

## 2012-05-28 DIAGNOSIS — Z125 Encounter for screening for malignant neoplasm of prostate: Secondary | ICD-10-CM

## 2012-05-28 DIAGNOSIS — R42 Dizziness and giddiness: Secondary | ICD-10-CM

## 2012-05-28 DIAGNOSIS — R079 Chest pain, unspecified: Secondary | ICD-10-CM

## 2012-05-28 LAB — HEPATIC FUNCTION PANEL
Bilirubin, Direct: 0.2 mg/dL (ref 0.0–0.3)
Total Bilirubin: 1.8 mg/dL — ABNORMAL HIGH (ref 0.3–1.2)
Total Protein: 7.2 g/dL (ref 6.0–8.3)

## 2012-05-28 LAB — CBC WITH DIFFERENTIAL/PLATELET
Basophils Relative: 0.4 % (ref 0.0–3.0)
Eosinophils Relative: 3.1 % (ref 0.0–5.0)
HCT: 46.5 % (ref 39.0–52.0)
Hemoglobin: 16.2 g/dL (ref 13.0–17.0)
Lymphs Abs: 2 10*3/uL (ref 0.7–4.0)
MCV: 89.3 fl (ref 78.0–100.0)
Monocytes Absolute: 0.9 10*3/uL (ref 0.1–1.0)
Monocytes Relative: 10 % (ref 3.0–12.0)
Neutro Abs: 5.8 10*3/uL (ref 1.4–7.7)
WBC: 9 10*3/uL (ref 4.5–10.5)

## 2012-05-28 LAB — URINALYSIS, ROUTINE W REFLEX MICROSCOPIC
Bilirubin Urine: NEGATIVE
Ketones, ur: NEGATIVE
Specific Gravity, Urine: 1.02 (ref 1.000–1.030)
Total Protein, Urine: NEGATIVE
Urine Glucose: NEGATIVE
pH: 6.5 (ref 5.0–8.0)

## 2012-05-28 LAB — LIPID PANEL
HDL: 35.3 mg/dL — ABNORMAL LOW (ref >39.00)
VLDL: 58.6 mg/dL — ABNORMAL HIGH (ref 0.0–40.0)

## 2012-05-28 LAB — BASIC METABOLIC PANEL
BUN: 17 mg/dL (ref 6–23)
Chloride: 102 mEq/L (ref 96–112)
GFR: 88.09 mL/min (ref >60.00)
Potassium: 4.3 mEq/L (ref 3.5–5.1)
Sodium: 138 mEq/L (ref 135–145)

## 2012-05-28 LAB — LDL CHOLESTEROL, DIRECT: Direct LDL: 48 mg/dL

## 2012-05-28 MED ORDER — MECLIZINE HCL 50 MG PO TABS: 50.0000 mg | ORAL_TABLET | Freq: Three times a day (TID) | ORAL | Status: DC | PRN

## 2012-05-28 NOTE — Progress Notes (Signed)
Subjective:    Patient ID: [name], male    DOB: 04-10-1961, 52 y.o.   MRN: [id number]  HPI  Pt presents to the clinic today with c/o chest pain, dizziness and numbness in his lips. This happened 2 days ago. The chest pain and numbness in his lips resolved spontaneously. He is still having some intermittent dizziness. He has had a previous MI in the past related to cocaine abuse. He does have a history of HTN and CAD. He does smoke marijuana currently and is an everyday drinker. He denies shortness of breath, nausea, diaphoresis, or pain in his arms.  Review of Systems      Past Medical History  Diagnosis Date  . Anxiety   . GERD (gastroesophageal reflux disease)   . Hyperlipidemia   . Diabetes mellitus      II, borderline, diet controlled  . 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 CAD (coronary artery disease)   . OSA (obstructive sleep apnea)     cpap -PSG apr30 2009, AHI  119  . Pain, low back   . Tobacco use disorder   . Cocaine abuse     none now  . Obesity, unspecified     Current Outpatient Prescriptions  Medication Sig Dispense Refill  . alprazolam (XANAX) 2 MG tablet Take 1 tablet (2 mg total) by mouth 3 (three) times daily as needed for anxiety. For anxiety  90 tablet  3  . aspirin EC 81 MG tablet Take 1 tablet (81 mg total) by mouth daily.      [name] atorvastatin (LIPITOR) 20 MG tablet Take 1 tablet (20 mg total) by mouth daily.  30 tablet  3  . losartan (COZAAR) 100 MG tablet Take 1 tablet (100 mg total) by mouth daily.  90 tablet  3  . naproxen (NAPROSYN) 500 MG tablet Take 1 tablet (500 mg total) by mouth 2 (two) times daily with a meal.  90 tablet  2  . nitroGLYCERIN (NITROSTAT) 0.4 MG SL tablet Place 0.4 mg under the tongue every 5 (five) minutes as needed.        . traMADol (ULTRAM) 50 MG tablet Take 1-2 tablets (50-100 mg total) by mouth 2 (two) times daily as needed.  90 tablet  3  . Azelastine-Fluticasone (DYMISTA)  137-50 MCG/ACT SUSP Place 1 Act into the nose 1 day or 1 dose.  23 g  5  . buPROPion (ZYBAN) 150 MG 12 hr tablet Take 1 tablet (150 mg total) by mouth 2 (two) times daily.  60 tablet  2  . Fluticasone-Salmeterol (ADVAIR DISKUS) 250-50 MCG/DOSE AEPB Inhale 1 puff into the lungs 2 (two) times daily.  60 each  2  . metoprolol tartrate (LOPRESSOR) 25 MG tablet Take 1 tablet (25 mg total) by mouth 2 (two) times daily.  60 tablet  6   Current Facility-Administered Medications  Medication Dose Route Frequency Provider Last Rate Last Dose  . albuterol (PROVENTIL) (2.5 MG/3ML) 0.083% nebulizer solution 2.5 mg  2.5 mg Nebulization Once Tresa Garter, MD        Allergies  Allergen Reactions  . Ace Inhibitors   . Lisinopril     cough    Family History  Problem Relation Age of Onset  . Heart attack Father 76    grandfather died MI 23  . Heart disease Father     cad  . Heart attack    . Atrial fibrillation Other   .  Cancer Mother     lung ca    History   Social History  . Marital Status: Widowed    Spouse Name: N/A    Number of Children: 2  . Years of Education: N/A   Occupational History  . car sales    Social History Main Topics  . Smoking status: Current Every Day Smoker -- 1.5 packs/day for 25 years    Types: Cigarettes  . Smokeless tobacco: Never Used  . Alcohol Use: 6.0 oz/week    12 drink(s) per week  . Drug Use: Yes    Special: Marijuana     Comment: cocaine abuse yrs ago. none now.  . Sexually Active: Not on file   Other Topics Concern  . Not on file   Social History Narrative  . No narrative on file     Constitutional: Denies fever, malaise, fatigue, headache or abrupt weight changes.  Respiratory: Denies difficulty breathing, shortness of breath, cough or sputum production.   Cardiovascular: Pt reports chest pain. Denies chest tightness, palpitations or swelling in the hands or feet.  Gastrointestinal: Denies abdominal pain, bloating, constipation,  diarrhea or blood in the stool.  Neurological: Pt reports numbness in his lips. Denies dizziness, difficulty with memory, difficulty with speech or problems with balance and coordination.   No other specific complaints in a complete review of systems (except as listed in HPI above).  Objective:   Physical Exam   BP 126/72  Pulse 86  Temp 97.9 F (36.6 C) (Oral)  Ht 6' (1.829 m)  Wt 300 lb (136.079 kg)  BMI 40.69 kg/m2  SpO2 96% Wt Readings from Last 3 Encounters:  05/28/12 300 lb (136.079 kg)  03/09/12 303 lb (137.44 kg)  03/03/12 303 lb (137.44 kg)    General: Appears his stated age, well developed, well nourished in NAD. Skin: Warm, dry and intact. No rashes, lesions or ulcerations noted. HEENT: Head: normal shape and size; Eyes: sclera white, no icterus, conjunctiva pink, PERRLA and EOMs intact; Ears: Tm's gray and intact, normal light reflex; Nose: mucosa pink and moist, septum midline; Throat/Mouth: Teeth present, mucosa pink and moist, no exudate, lesions or ulcerations noted.  Neck: Normal range of motion. Neck supple, trachea midline. No massses, lumps or thyromegaly present.  Cardiovascular: Normal rate and rhythm. S1,S2 noted.  No murmur, rubs or gallops noted. No JVD or BLE edema. No carotid bruits noted. Pulmonary/Chest: Normal effort and positive vesicular breath sounds. No respiratory distress. No wheezes, rales or ronchi noted.  Neurological: Alert and oriented. Cranial nerves II-XII intact. Coordination normal. +DTRs bilaterally. Psychiatric: Mood and affect normal. Behavior is normal. Judgment and thought content normal.   BMET    Component Value Date/Time   NA 141 02/06/2012 0854   K 4.7 02/06/2012 0854   CL 107 02/06/2012 0854   CO2 27 02/06/2012 0854   GLUCOSE 90 02/06/2012 0854   BUN 19 02/06/2012 0854   CREATININE 0.9 02/06/2012 0854   CALCIUM 9.0 02/06/2012 0854   GFRNONAA >90 05/07/2011 2313   GFRAA >90 05/07/2011 2313    Lipid Panel     Component  Value Date/Time   CHOL 173 02/06/2012 0854   TRIG 149.0 02/06/2012 0854   HDL 34.60* 02/06/2012 0854   CHOLHDL 5 02/06/2012 0854   VLDL 29.8 02/06/2012 0854   LDLCALC 109* 02/06/2012 0854    CBC    Component Value Date/Time   WBC 7.4 02/06/2012 0854   RBC 5.03 02/06/2012 0854   HGB 15.4 02/06/2012  0854   HCT 46.1 02/06/2012 0854   PLT 161.0 02/06/2012 0854   MCV 91.7 02/06/2012 0854   MCH 30.2 05/07/2011 2313   MCHC 33.5 02/06/2012 0854   RDW 13.0 02/06/2012 0854   LYMPHSABS 2.1 02/06/2012 0854   MONOABS 0.7 02/06/2012 0854   EOSABS 0.2 02/06/2012 0854   BASOSABS 0.0 02/06/2012 0854    Hgb A1C Lab Results  Component Value Date   HGBA1C 5.6 03/08/2011        Assessment & Plan:   Chest pain, new onset with additional workup required:  Will obtain EKG to r/o cardiac ischemia. Will obtain lab work today eRx for Meclizine TID prn  Follow up with cardiology 06/03/2012 at 8:30. If symptoms returns or get worse, go to the ER immediatly

## 2012-05-28 NOTE — Telephone Encounter (Signed)
Received call from assistant to [facility] from Dr. Everardo All office about pt having chest pain  & dizziness. States his EKG today was negative, pt chest pain has been on/off and he was painfree at this time.  No dizziness noted.  I asked to find out from pt if this was progressively worse than his last ov with Dr. Shirlee Latch on 02/2012. She stated that" he said it was not. It was about the same"  Appt made with Herma Carson PA for Wednesday 06/03/12 at 0830am. Pt aware and agrees with appt.   He is painfree at Dr. George Hugh office. Mylo Red RN

## 2012-05-28 NOTE — Patient Instructions (Addendum)

## 2012-06-03 ENCOUNTER — Ambulatory Visit: Payer: Self-pay | Admitting: Physician Assistant

## 2012-06-10 ENCOUNTER — Ambulatory Visit: Payer: BC Managed Care – PPO | Admitting: Internal Medicine

## 2012-06-11 ENCOUNTER — Telehealth: Payer: Self-pay | Admitting: Internal Medicine

## 2012-06-11 NOTE — Telephone Encounter (Signed)
Patient left message on triage requesting a call back, no other information given

## 2012-06-12 NOTE — Telephone Encounter (Signed)
Called pt- Pt has OV scheduled for Monday. Says he will discuss his problem then.

## 2012-06-15 ENCOUNTER — Encounter: Payer: Self-pay | Admitting: Internal Medicine

## 2012-06-15 ENCOUNTER — Ambulatory Visit (INDEPENDENT_AMBULATORY_CARE_PROVIDER_SITE_OTHER): Payer: BC Managed Care – PPO | Admitting: Internal Medicine

## 2012-06-15 ENCOUNTER — Other Ambulatory Visit: Payer: BC Managed Care – PPO

## 2012-06-15 ENCOUNTER — Other Ambulatory Visit: Payer: Self-pay | Admitting: *Deleted

## 2012-06-15 VITALS — BP 122/90 | HR 76 | Temp 98.2°F | Resp 16 | Wt 299.0 lb

## 2012-06-15 DIAGNOSIS — I10 Essential (primary) hypertension: Secondary | ICD-10-CM

## 2012-06-15 DIAGNOSIS — R110 Nausea: Secondary | ICD-10-CM

## 2012-06-15 DIAGNOSIS — F17200 Nicotine dependence, unspecified, uncomplicated: Secondary | ICD-10-CM

## 2012-06-15 DIAGNOSIS — F411 Generalized anxiety disorder: Secondary | ICD-10-CM

## 2012-06-15 DIAGNOSIS — K219 Gastro-esophageal reflux disease without esophagitis: Secondary | ICD-10-CM

## 2012-06-15 DIAGNOSIS — E785 Hyperlipidemia, unspecified: Secondary | ICD-10-CM

## 2012-06-15 DIAGNOSIS — I2510 Atherosclerotic heart disease of native coronary artery without angina pectoris: Secondary | ICD-10-CM

## 2012-06-15 NOTE — Assessment & Plan Note (Signed)
Wt Readings from Last 3 Encounters:  06/15/12 299 lb (135.626 kg)  05/28/12 300 lb (136.079 kg)  03/09/12 303 lb (137.44 kg)

## 2012-06-15 NOTE — Assessment & Plan Note (Signed)
Continue with current prescription therapy as reflected on the Med list.  

## 2012-06-15 NOTE — Progress Notes (Signed)
   Subjective:    Patient ID: [name], male    DOB: [birth year], 52 y.o.   MRN: [id number]  HPI  C/o x's of arsenic poisoning - asked to be checked for it... The patient is here to follow up on chronic HTN, anxiety, elev glu and chronic moderate LBP symptoms controlled with medicines. His brother died of a MI at 67. C/o memory issues lately, stressed Not loosing wt lately   Review of Systems  Constitutional: Negative for appetite change, fatigue and unexpected weight change.  HENT: Negative for nosebleeds, congestion, sore throat, sneezing, trouble swallowing and neck pain.   Eyes: Negative for itching and visual disturbance.  Respiratory: Negative for cough.   Cardiovascular: Negative for chest pain, palpitations and leg swelling.  Gastrointestinal: Negative for nausea, diarrhea, blood in stool and abdominal distention.  Genitourinary: Negative for frequency and hematuria.  Musculoskeletal: Negative for back pain, joint swelling and gait problem.  Skin: Negative for rash.  Neurological: Negative for dizziness, tremors, speech difficulty and weakness.  Psychiatric/Behavioral: Negative for suicidal ideas, sleep disturbance, dysphoric mood and agitation. The patient is not nervous/anxious.    Wt Readings from Last 3 Encounters:  06/15/12 299 lb (135.626 kg)  05/28/12 300 lb (136.079 kg)  03/09/12 303 lb (137.44 kg)   BP Readings from Last 3 Encounters:  06/15/12 122/90  05/28/12 126/72  03/09/12 120/64       Objective:   Physical Exam  Constitutional: He is oriented to person, place, and time. He appears well-developed. No distress.       obese  HENT:  Mouth/Throat: Oropharynx is clear and moist.  Eyes: Conjunctivae normal are normal. Pupils are equal, round, and reactive to light.  Neck: Normal range of motion. No JVD present. No thyromegaly present.  Cardiovascular: Normal rate, regular rhythm, normal heart sounds and intact distal pulses.  Exam reveals  no gallop and no friction rub.   No murmur heard. Pulmonary/Chest: Effort normal and breath sounds normal. No respiratory distress. He has no wheezes. He has no rales. He exhibits no tenderness.  Abdominal: Soft. Bowel sounds are normal. He exhibits no distension and no mass. There is no tenderness. There is no rebound and no guarding.  Musculoskeletal: Normal range of motion. He exhibits no edema and no tenderness.  Lymphadenopathy:    He has no cervical adenopathy.  Neurological: He is alert and oriented to person, place, and time. He has normal reflexes. No cranial nerve deficit. He exhibits normal muscle tone. Coordination normal.  Skin: Skin is warm and dry. No rash noted.  Psychiatric: He has a normal mood and affect. His behavior is normal. Judgment and thought content normal.   Lab Results  Component Value Date   WBC 9.0 05/28/2012   HGB 16.2 05/28/2012   HCT 46.5 05/28/2012   PLT 161.0 05/28/2012   GLUCOSE 90 05/28/2012   CHOL 123 05/28/2012   TRIG 293.0* 05/28/2012   HDL 35.30* 05/28/2012   LDLDIRECT 48.0 05/28/2012   LDLCALC 109* 02/06/2012   ALT 38 05/28/2012   AST 23 05/28/2012   NA 138 05/28/2012   K 4.3 05/28/2012   CL 102 05/28/2012   CREATININE 1.0 05/28/2012   BUN 17 05/28/2012   CO2 29 05/28/2012   TSH 2.08 05/28/2012   PSA 0.36 05/28/2012   INR 1.1* 02/06/2012   HGBA1C 5.6 03/08/2011         Assessment & Plan:

## 2012-06-16 NOTE — Assessment & Plan Note (Signed)
Continue with current prescription therapy as reflected on the Med list.  

## 2012-06-16 NOTE — Assessment & Plan Note (Signed)
He will try e-cigs

## 2012-06-17 ENCOUNTER — Telehealth: Payer: Self-pay | Admitting: Internal Medicine

## 2012-06-17 NOTE — Telephone Encounter (Signed)
Patient is requesting call back with his test results

## 2012-06-18 NOTE — Telephone Encounter (Signed)
Patient left message on triage requesting his test results

## 2012-06-19 LAB — HEAVY METALS, RANDOM URINE
Arsenic Random, Urine: 36 mcg/g creat (ref <51)
Creatinine Random, Urine: 188.5 mg/dL (ref 20.0–370.0)

## 2012-06-19 NOTE — Telephone Encounter (Signed)
N/a yet Thx

## 2012-06-20 ENCOUNTER — Other Ambulatory Visit: Payer: Self-pay | Admitting: Cardiology

## 2012-06-22 ENCOUNTER — Telehealth: Payer: Self-pay | Admitting: Internal Medicine

## 2012-06-22 NOTE — Telephone Encounter (Signed)
[name], please, inform patient that his arenic and mercury levels in urine were within acceptable range Thx

## 2012-06-22 NOTE — Telephone Encounter (Signed)
Pt informed

## 2012-06-23 NOTE — Telephone Encounter (Signed)
See other message - they are ok Thx

## 2012-06-23 NOTE — Telephone Encounter (Signed)
Please advise on labs.

## 2012-07-09 ENCOUNTER — Telehealth: Payer: Self-pay | Admitting: *Deleted

## 2012-07-09 MED ORDER — ALPRAZOLAM 2 MG PO TABS: 2.0000 mg | ORAL_TABLET | Freq: Three times a day (TID) | ORAL | Status: DC | PRN

## 2012-07-09 NOTE — Telephone Encounter (Signed)
Done

## 2012-07-09 NOTE — Telephone Encounter (Signed)
OK to fill this prescription with additional refills x2 Thank you!  

## 2012-07-09 NOTE — Telephone Encounter (Signed)
Rf req for Alprazolam 2 mg 1 po tid prn. # 90. Last filled 06/08/12. Ok to Rf?

## 2012-08-02 ENCOUNTER — Other Ambulatory Visit: Payer: Self-pay | Admitting: Internal Medicine

## 2012-09-08 ENCOUNTER — Telehealth: Payer: Self-pay | Admitting: *Deleted

## 2012-09-08 MED ORDER — TRAMADOL HCL 50 MG PO TABS: 50.0000 mg | ORAL_TABLET | Freq: Two times a day (BID) | ORAL | Status: DC | PRN

## 2012-09-08 NOTE — Telephone Encounter (Signed)
Tramadol request [Last Rx 10.28.13 #90x3]/SLS Please advise.

## 2012-09-08 NOTE — Telephone Encounter (Signed)
30d rx authorized, erx done - no refills - covering in absence of usual PCP thanks

## 2012-09-21 ENCOUNTER — Encounter: Payer: Self-pay | Admitting: Internal Medicine

## 2012-09-21 ENCOUNTER — Ambulatory Visit (INDEPENDENT_AMBULATORY_CARE_PROVIDER_SITE_OTHER): Payer: BC Managed Care – PPO | Admitting: Internal Medicine

## 2012-09-21 VITALS — BP 130/72 | HR 80 | Temp 98.0°F | Resp 16 | Wt 288.0 lb

## 2012-09-21 DIAGNOSIS — I10 Essential (primary) hypertension: Secondary | ICD-10-CM

## 2012-09-21 DIAGNOSIS — R079 Chest pain, unspecified: Secondary | ICD-10-CM

## 2012-09-21 DIAGNOSIS — F411 Generalized anxiety disorder: Secondary | ICD-10-CM

## 2012-09-21 DIAGNOSIS — F17200 Nicotine dependence, unspecified, uncomplicated: Secondary | ICD-10-CM

## 2012-09-21 MED ORDER — LOSARTAN POTASSIUM 100 MG PO TABS: 100.0000 mg | ORAL_TABLET | Freq: Every day | ORAL | Status: DC

## 2012-09-21 MED ORDER — NAPROXEN 500 MG PO TABS: 500.0000 mg | ORAL_TABLET | Freq: Every day | ORAL | Status: DC | PRN

## 2012-09-21 MED ORDER — ALPRAZOLAM 2 MG PO TABS: 2.0000 mg | ORAL_TABLET | Freq: Three times a day (TID) | ORAL | Status: DC | PRN

## 2012-09-21 NOTE — Assessment & Plan Note (Signed)
Discussed.

## 2012-09-21 NOTE — Assessment & Plan Note (Signed)
No relapse 

## 2012-09-21 NOTE — Assessment & Plan Note (Signed)
Continue with current prescription therapy as reflected on the Med list.  

## 2012-09-21 NOTE — Progress Notes (Signed)
   Subjective:    Patient ID: [name], male    DOB: 05-Apr-1961, 52 y.o.   MRN: [id number]  HPI  He is engaged The patient is here to follow up on chronic HTN, anxiety, elev glu and chronic moderate LBP symptoms controlled with medicines. His brother died of a MI at 87. C/o memory issues lately, stressed Not loosing wt lately.   Review of Systems  Constitutional: Negative for appetite change, fatigue and unexpected weight change.  HENT: Negative for nosebleeds, congestion, sore throat, sneezing, trouble swallowing and neck pain.   Eyes: Negative for itching and visual disturbance.  Respiratory: Negative for cough.   Cardiovascular: Negative for chest pain, palpitations and leg swelling.  Gastrointestinal: Negative for nausea, diarrhea, blood in stool and abdominal distention.  Genitourinary: Negative for frequency and hematuria.  Musculoskeletal: Negative for back pain, joint swelling and gait problem.  Skin: Negative for rash.  Neurological: Negative for dizziness, tremors, speech difficulty and weakness.  Psychiatric/Behavioral: Negative for suicidal ideas, sleep disturbance, dysphoric mood and agitation. The patient is not nervous/anxious.    Wt Readings from Last 3 Encounters:  09/21/12 288 lb (130.636 kg)  06/15/12 299 lb (135.626 kg)  05/28/12 300 lb (136.079 kg)   BP Readings from Last 3 Encounters:  09/21/12 130/72  06/15/12 122/90  05/28/12 126/72       Objective:   Physical Exam  Constitutional: He is oriented to person, place, and time. He appears well-developed. No distress.  obese  HENT:  Mouth/Throat: Oropharynx is clear and moist.  Eyes: Conjunctivae are normal. Pupils are equal, round, and reactive to light.  Neck: Normal range of motion. No JVD present. No thyromegaly present.  Cardiovascular: Normal rate, regular rhythm, normal heart sounds and intact distal pulses.  Exam reveals no gallop and no friction rub.   No murmur  heard. Pulmonary/Chest: Effort normal and breath sounds normal. No respiratory distress. He has no wheezes. He has no rales. He exhibits no tenderness.  Abdominal: Soft. Bowel sounds are normal. He exhibits no distension and no mass. There is no tenderness. There is no rebound and no guarding.  Musculoskeletal: Normal range of motion. He exhibits no edema and no tenderness.  Lymphadenopathy:    He has no cervical adenopathy.  Neurological: He is alert and oriented to person, place, and time. He has normal reflexes. No cranial nerve deficit. He exhibits normal muscle tone. Coordination normal.  Skin: Skin is warm and dry. No rash noted.  Psychiatric: He has a normal mood and affect. His behavior is normal. Judgment and thought content normal.   Lab Results  Component Value Date   WBC 9.0 05/28/2012   HGB 16.2 05/28/2012   HCT 46.5 05/28/2012   PLT 161.0 05/28/2012   GLUCOSE 90 05/28/2012   CHOL 123 05/28/2012   TRIG 293.0* 05/28/2012   HDL 35.30* 05/28/2012   LDLDIRECT 48.0 05/28/2012   LDLCALC 109* 02/06/2012   ALT 38 05/28/2012   AST 23 05/28/2012   NA 138 05/28/2012   K 4.3 05/28/2012   CL 102 05/28/2012   CREATININE 1.0 05/28/2012   BUN 17 05/28/2012   CO2 29 05/28/2012   TSH 2.08 05/28/2012   PSA 0.36 05/28/2012   INR 1.1* 02/06/2012   HGBA1C 5.6 03/08/2011         Assessment & Plan:

## 2012-10-05 ENCOUNTER — Other Ambulatory Visit: Payer: Self-pay | Admitting: Internal Medicine

## 2012-10-09 ENCOUNTER — Encounter: Payer: Self-pay | Admitting: Gastroenterology

## 2012-11-02 ENCOUNTER — Other Ambulatory Visit: Payer: Self-pay | Admitting: Internal Medicine

## 2012-11-03 NOTE — Telephone Encounter (Signed)
Will you refill in Dr Plotnikov's absence? 

## 2012-12-04 ENCOUNTER — Other Ambulatory Visit: Payer: Self-pay | Admitting: Internal Medicine

## 2012-12-11 NOTE — Telephone Encounter (Signed)
Called pharmacy left refill on Dr. Jenel Lucks mail...lmb

## 2012-12-22 ENCOUNTER — Ambulatory Visit (INDEPENDENT_AMBULATORY_CARE_PROVIDER_SITE_OTHER)
Admission: RE | Admit: 2012-12-22 | Discharge: 2012-12-22 | Disposition: A | Discharge status: Home / Self Care | Payer: BC Managed Care – PPO | Source: Ambulatory Visit | Attending: Internal Medicine | Admitting: Internal Medicine

## 2012-12-22 ENCOUNTER — Other Ambulatory Visit (INDEPENDENT_AMBULATORY_CARE_PROVIDER_SITE_OTHER): Payer: BC Managed Care – PPO

## 2012-12-22 ENCOUNTER — Ambulatory Visit (INDEPENDENT_AMBULATORY_CARE_PROVIDER_SITE_OTHER): Payer: BC Managed Care – PPO | Admitting: Internal Medicine

## 2012-12-22 ENCOUNTER — Encounter: Payer: Self-pay | Admitting: Internal Medicine

## 2012-12-22 VITALS — BP 130/78 | HR 80 | Temp 97.7°F | Resp 16 | Wt 294.0 lb

## 2012-12-22 DIAGNOSIS — J019 Acute sinusitis, unspecified: Secondary | ICD-10-CM

## 2012-12-22 DIAGNOSIS — R079 Chest pain, unspecified: Secondary | ICD-10-CM

## 2012-12-22 DIAGNOSIS — F411 Generalized anxiety disorder: Secondary | ICD-10-CM

## 2012-12-22 DIAGNOSIS — I10 Essential (primary) hypertension: Secondary | ICD-10-CM

## 2012-12-22 DIAGNOSIS — K047 Periapical abscess without sinus: Secondary | ICD-10-CM

## 2012-12-22 DIAGNOSIS — F17200 Nicotine dependence, unspecified, uncomplicated: Secondary | ICD-10-CM

## 2012-12-22 DIAGNOSIS — R0602 Shortness of breath: Secondary | ICD-10-CM

## 2012-12-22 DIAGNOSIS — K219 Gastro-esophageal reflux disease without esophagitis: Secondary | ICD-10-CM

## 2012-12-22 LAB — URINALYSIS, ROUTINE W REFLEX MICROSCOPIC
Ketones, ur: NEGATIVE
Nitrite: NEGATIVE
Specific Gravity, Urine: 1.01 (ref 1.000–1.030)
Urobilinogen, UA: 0.2 (ref 0.0–1.0)
pH: 7 (ref 5.0–8.0)

## 2012-12-22 LAB — LIPID PANEL: VLDL: 29.8 mg/dL (ref 0.0–40.0)

## 2012-12-22 LAB — BASIC METABOLIC PANEL
BUN: 17 mg/dL (ref 6–23)
Chloride: 104 mEq/L (ref 96–112)
Glucose, Bld: 99 mg/dL (ref 70–99)
Potassium: 4.2 mEq/L (ref 3.5–5.1)

## 2012-12-22 LAB — HEPATIC FUNCTION PANEL
ALT: 38 U/L (ref 0–53)
Alkaline Phosphatase: 57 U/L (ref 39–117)
Bilirubin, Direct: 0.2 mg/dL (ref 0.0–0.3)
Total Bilirubin: 1.2 mg/dL (ref 0.3–1.2)

## 2012-12-22 MED ORDER — LOSARTAN POTASSIUM 100 MG PO TABS: 100.0000 mg | ORAL_TABLET | Freq: Every day | ORAL | Status: DC

## 2012-12-22 MED ORDER — ATORVASTATIN CALCIUM 20 MG PO TABS: 20.0000 mg | ORAL_TABLET | Freq: Every day | ORAL | Status: DC

## 2012-12-22 MED ORDER — AMOXICILLIN-POT CLAVULANATE 875-125 MG PO TABS: 1.0000 | ORAL_TABLET | Freq: Two times a day (BID) | ORAL | Status: DC

## 2012-12-22 MED ORDER — ALPRAZOLAM 2 MG PO TABS: 2.0000 mg | ORAL_TABLET | Freq: Three times a day (TID) | ORAL | Status: DC | PRN

## 2012-12-22 MED ORDER — NITROGLYCERIN 0.4 MG SL SUBL: 0.4000 mg | SUBLINGUAL_TABLET | SUBLINGUAL | Status: DC | PRN

## 2012-12-22 MED ORDER — TRAMADOL HCL 50 MG PO TABS: 50.0000 mg | ORAL_TABLET | Freq: Two times a day (BID) | ORAL | Status: DC

## 2012-12-22 NOTE — Patient Instructions (Signed)
Go to ER if worse Cut back on smoking, loose wt

## 2012-12-22 NOTE — Assessment & Plan Note (Signed)
smoking 2 ppd, gained wt CXR

## 2012-12-22 NOTE — Assessment & Plan Note (Signed)
Oral surgery consult Po abx

## 2012-12-22 NOTE — Assessment & Plan Note (Signed)
Worse Wt Readings from Last 3 Encounters:  12/22/12 294 lb (133.358 kg)  09/21/12 288 lb (130.636 kg)  06/15/12 299 lb (135.626 kg)

## 2012-12-22 NOTE — Assessment & Plan Note (Signed)
Continue with current prescription therapy as reflected on the Med list.  

## 2012-12-22 NOTE — Assessment & Plan Note (Signed)
CXR - too heavy

## 2012-12-22 NOTE — Assessment & Plan Note (Signed)
Augmentin x 10 d 

## 2012-12-22 NOTE — Assessment & Plan Note (Signed)
Cut back

## 2012-12-22 NOTE — Progress Notes (Signed)
   Subjective:     HPI  He got re-married in 8/14 C/o not feeling well x 2 wks; bad tooth x 6 mo C/o sinusitis infection x 2 wks C/o some CPs, SOB - smoking 2 ppd, gained wt The patient is here to follow up on chronic HTN, anxiety, elev glu and chronic moderate LBP symptoms controlled with medicines. His brother died of a MI at 24. C/o memory issues - not better Not loosing wt lately.   Review of Systems  Constitutional: Negative for appetite change, fatigue and unexpected weight change.  HENT: Negative for nosebleeds, congestion, sore throat, sneezing, trouble swallowing and neck pain.   Eyes: Negative for itching and visual disturbance.  Respiratory: Negative for cough.   Cardiovascular: Negative for chest pain, palpitations and leg swelling.  Gastrointestinal: Negative for nausea, diarrhea, blood in stool and abdominal distention.  Genitourinary: Negative for frequency and hematuria.  Musculoskeletal: Negative for back pain, joint swelling and gait problem.  Skin: Negative for rash.  Neurological: Negative for dizziness, tremors, speech difficulty and weakness.  Psychiatric/Behavioral: Negative for suicidal ideas, sleep disturbance, dysphoric mood and agitation. The patient is not nervous/anxious.    Wt Readings from Last 3 Encounters:  12/22/12 294 lb (133.358 kg)  09/21/12 288 lb (130.636 kg)  06/15/12 299 lb (135.626 kg)   BP Readings from Last 3 Encounters:  12/22/12 130/78  09/21/12 130/72  06/15/12 122/90       Objective:   Physical Exam  Constitutional: He is oriented to person, place, and time. He appears well-developed. No distress.  obese  HENT:  Mouth/Throat: Oropharynx is clear and moist.  Eyes: Conjunctivae are normal. Pupils are equal, round, and reactive to light.  Neck: Normal range of motion. No JVD present. No thyromegaly present.  Cardiovascular: Normal rate, regular rhythm, normal heart sounds and intact distal pulses.  Exam reveals no gallop and  no friction rub.   No murmur heard. Pulmonary/Chest: Effort normal and breath sounds normal. No respiratory distress. He has no wheezes. He has no rales. He exhibits no tenderness.  Abdominal: Soft. Bowel sounds are normal. He exhibits no distension and no mass. There is no tenderness. There is no rebound and no guarding.  Musculoskeletal: Normal range of motion. He exhibits no edema and no tenderness.  Lymphadenopathy:    He has no cervical adenopathy.  Neurological: He is alert and oriented to person, place, and time. He has normal reflexes. No cranial nerve deficit. He exhibits normal muscle tone. Coordination normal.  Skin: Skin is warm and dry. No rash noted.  Psychiatric: He has a normal mood and affect. His behavior is normal. Judgment and thought content normal.  R front tooth is infected  Lab Results  Component Value Date   WBC 9.0 05/28/2012   HGB 16.2 05/28/2012   HCT 46.5 05/28/2012   PLT 161.0 05/28/2012   GLUCOSE 90 05/28/2012   CHOL 123 05/28/2012   TRIG 293.0* 05/28/2012   HDL 35.30* 05/28/2012   LDLDIRECT 48.0 05/28/2012   LDLCALC 109* 02/06/2012   ALT 38 05/28/2012   AST 23 05/28/2012   NA 138 05/28/2012   K 4.3 05/28/2012   CL 102 05/28/2012   CREATININE 1.0 05/28/2012   BUN 17 05/28/2012   CO2 29 05/28/2012   TSH 2.08 05/28/2012   PSA 0.36 05/28/2012   INR 1.1* 02/06/2012   HGBA1C 5.6 03/08/2011         Assessment & Plan:

## 2013-01-05 ENCOUNTER — Other Ambulatory Visit: Payer: Self-pay | Admitting: Internal Medicine

## 2013-01-05 NOTE — Telephone Encounter (Signed)
Resent rx to pharmacy.

## 2013-01-29 ENCOUNTER — Other Ambulatory Visit: Payer: Self-pay | Admitting: Internal Medicine

## 2013-03-04 ENCOUNTER — Encounter: Payer: Self-pay | Admitting: Internal Medicine

## 2013-03-04 ENCOUNTER — Ambulatory Visit (INDEPENDENT_AMBULATORY_CARE_PROVIDER_SITE_OTHER): Payer: BC Managed Care – PPO | Admitting: Internal Medicine

## 2013-03-04 VITALS — BP 122/80 | HR 80 | Temp 96.7°F | Resp 16 | Wt 297.0 lb

## 2013-03-04 DIAGNOSIS — F17200 Nicotine dependence, unspecified, uncomplicated: Secondary | ICD-10-CM

## 2013-03-04 DIAGNOSIS — Z23 Encounter for immunization: Secondary | ICD-10-CM

## 2013-03-04 DIAGNOSIS — I2510 Atherosclerotic heart disease of native coronary artery without angina pectoris: Secondary | ICD-10-CM

## 2013-03-04 DIAGNOSIS — F411 Generalized anxiety disorder: Secondary | ICD-10-CM

## 2013-03-04 DIAGNOSIS — M545 Low back pain: Secondary | ICD-10-CM

## 2013-03-04 MED ORDER — NAPROXEN 500 MG PO TABS: ORAL_TABLET | ORAL | Status: DC

## 2013-03-04 MED ORDER — ATORVASTATIN CALCIUM 20 MG PO TABS: 20.0000 mg | ORAL_TABLET | Freq: Every day | ORAL | Status: DC

## 2013-03-04 MED ORDER — METOPROLOL TARTRATE 25 MG PO TABS: 25.0000 mg | ORAL_TABLET | Freq: Two times a day (BID) | ORAL | Status: DC

## 2013-03-04 MED ORDER — TRAMADOL HCL 50 MG PO TABS: 50.0000 mg | ORAL_TABLET | Freq: Two times a day (BID) | ORAL | Status: DC

## 2013-03-04 MED ORDER — LOSARTAN POTASSIUM 100 MG PO TABS: 100.0000 mg | ORAL_TABLET | Freq: Every day | ORAL | Status: DC

## 2013-03-04 MED ORDER — ALPRAZOLAM 2 MG PO TABS: ORAL_TABLET | ORAL | Status: DC

## 2013-03-04 NOTE — Progress Notes (Signed)
Patient ID: [name], male   DOB: 06/20/1960, 52 y.o.   MRN: [id number]   Subjective:     HPI  He got re-married in 8/14 C/o not feeling well x 2 wks; bad tooth x 6 mo C/o sinusitis infection x 2 wks C/o some CPs, SOB - smoking 2 ppd, gained wt The patient is here to follow up on chronic HTN, anxiety, elev glu and chronic moderate LBP symptoms controlled with medicines. His brother died of a MI at 31. C/o memory issues - not better Not loosing wt lately.   Review of Systems  Constitutional: Negative for appetite change, fatigue and unexpected weight change.  HENT: Negative for congestion, nosebleeds, sneezing, sore throat and trouble swallowing.   Eyes: Negative for itching and visual disturbance.  Respiratory: Negative for cough.   Cardiovascular: Negative for chest pain, palpitations and leg swelling.  Gastrointestinal: Negative for nausea, diarrhea, blood in stool and abdominal distention.  Genitourinary: Negative for frequency and hematuria.  Musculoskeletal: Negative for back pain, gait problem, joint swelling and neck pain.  Skin: Negative for rash.  Neurological: Negative for dizziness, tremors, speech difficulty and weakness.  Psychiatric/Behavioral: Negative for suicidal ideas, sleep disturbance, dysphoric mood and agitation. The patient is not nervous/anxious.    Wt Readings from Last 3 Encounters:  03/04/13 297 lb (134.718 kg)  12/22/12 294 lb (133.358 kg)  09/21/12 288 lb (130.636 kg)   BP Readings from Last 3 Encounters:  03/04/13 122/80  12/22/12 130/78  09/21/12 130/72       Objective:   Physical Exam  Constitutional: He is oriented to person, place, and time. He appears well-developed. No distress.  obese  HENT:  Mouth/Throat: Oropharynx is clear and moist.  Eyes: Conjunctivae are normal. Pupils are equal, round, and reactive to light.  Neck: Normal range of motion. No JVD present. No thyromegaly present.  Cardiovascular: Normal rate,  regular rhythm, normal heart sounds and intact distal pulses.  Exam reveals no gallop and no friction rub.   No murmur heard. Pulmonary/Chest: Effort normal and breath sounds normal. No respiratory distress. He has no wheezes. He has no rales. He exhibits no tenderness.  Abdominal: Soft. Bowel sounds are normal. He exhibits no distension and no mass. There is no tenderness. There is no rebound and no guarding.  Musculoskeletal: Normal range of motion. He exhibits no edema and no tenderness.  Lymphadenopathy:    He has no cervical adenopathy.  Neurological: He is alert and oriented to person, place, and time. He has normal reflexes. No cranial nerve deficit. He exhibits normal muscle tone. Coordination normal.  Skin: Skin is warm and dry. No rash noted.  Psychiatric: He has a normal mood and affect. His behavior is normal. Judgment and thought content normal.  R front tooth is not infected  Lab Results  Component Value Date   WBC 9.0 05/28/2012   HGB 16.2 05/28/2012   HCT 46.5 05/28/2012   PLT 161.0 05/28/2012   GLUCOSE 99 12/22/2012   CHOL 131 12/22/2012   TRIG 149.0 12/22/2012   HDL 41.80 12/22/2012   LDLDIRECT 48.0 05/28/2012   LDLCALC 59 12/22/2012   ALT 38 12/22/2012   AST 25 12/22/2012   NA 139 12/22/2012   K 4.2 12/22/2012   CL 104 12/22/2012   CREATININE 0.9 12/22/2012   BUN 17 12/22/2012   CO2 28 12/22/2012   TSH 2.08 05/28/2012   PSA 0.36 05/28/2012   INR 1.1* 02/06/2012   HGBA1C 5.6 03/08/2011  Assessment & Plan:

## 2013-03-10 NOTE — Assessment & Plan Note (Signed)
Wt Readings from Last 3 Encounters:  03/04/13 297 lb (134.718 kg)  12/22/12 294 lb (133.358 kg)  09/21/12 288 lb (130.636 kg)

## 2013-03-10 NOTE — Assessment & Plan Note (Signed)
Continue with current prescription therapy as reflected on the Med list.  

## 2013-03-10 NOTE — Assessment & Plan Note (Signed)
Needs to quit.  

## 2013-03-22 ENCOUNTER — Ambulatory Visit: Payer: Self-pay | Admitting: Internal Medicine

## 2013-05-04 ENCOUNTER — Other Ambulatory Visit: Payer: Self-pay | Admitting: Internal Medicine

## 2013-05-05 NOTE — Telephone Encounter (Signed)
Ok to refill? Last OV 10.23.14 Last filled 10.23.14

## 2013-05-05 NOTE — Telephone Encounter (Signed)
Done hardcopy to [facility]  

## 2013-05-07 NOTE — Telephone Encounter (Signed)
Faxed hardcopy to [facility] 

## 2013-06-08 ENCOUNTER — Ambulatory Visit (INDEPENDENT_AMBULATORY_CARE_PROVIDER_SITE_OTHER): Payer: Self-pay | Admitting: Internal Medicine

## 2013-06-08 ENCOUNTER — Encounter: Payer: Self-pay | Admitting: Internal Medicine

## 2013-06-08 VITALS — BP 140/86 | HR 80 | Temp 97.3°F | Resp 16 | Wt 300.0 lb

## 2013-06-08 DIAGNOSIS — I2510 Atherosclerotic heart disease of native coronary artery without angina pectoris: Secondary | ICD-10-CM

## 2013-06-08 DIAGNOSIS — I10 Essential (primary) hypertension: Secondary | ICD-10-CM

## 2013-06-08 DIAGNOSIS — J329 Chronic sinusitis, unspecified: Secondary | ICD-10-CM

## 2013-06-08 DIAGNOSIS — J019 Acute sinusitis, unspecified: Secondary | ICD-10-CM

## 2013-06-08 DIAGNOSIS — J209 Acute bronchitis, unspecified: Secondary | ICD-10-CM

## 2013-06-08 MED ORDER — ALPRAZOLAM 2 MG PO TABS: ORAL_TABLET | ORAL | Status: DC

## 2013-06-08 MED ORDER — ATORVASTATIN CALCIUM 20 MG PO TABS: 20.0000 mg | ORAL_TABLET | Freq: Every day | ORAL | Status: DC

## 2013-06-08 MED ORDER — AMOXICILLIN-POT CLAVULANATE 875-125 MG PO TABS: 1.0000 | ORAL_TABLET | Freq: Two times a day (BID) | ORAL | Status: DC

## 2013-06-08 MED ORDER — TRAMADOL HCL 50 MG PO TABS: 50.0000 mg | ORAL_TABLET | Freq: Two times a day (BID) | ORAL | Status: DC

## 2013-06-08 MED ORDER — NAPROXEN 500 MG PO TABS: ORAL_TABLET | ORAL | Status: DC

## 2013-06-08 NOTE — Assessment & Plan Note (Signed)
Continue with current prescription therapy as reflected on the Med list.  

## 2013-06-08 NOTE — Assessment & Plan Note (Signed)
Ceftin bib x 10 d

## 2013-06-08 NOTE — Progress Notes (Signed)
Pre visit review using our clinic review tool, if applicable. No additional management support is needed unless otherwise documented below in the visit note. 

## 2013-06-08 NOTE — Assessment & Plan Note (Signed)
Wt Readings from Last 3 Encounters:  06/08/13 300 lb (136.079 kg)  03/04/13 297 lb (134.718 kg)  12/22/12 294 lb (133.358 kg)

## 2013-06-08 NOTE — Progress Notes (Signed)
   Subjective:     HPI  He got re-married in 8/14  C/o sinusitis infection x 2 wks - new  F/u some CPs, SOB - smoking 2 ppd, gained wt The patient is here to follow up on chronic HTN, anxiety, elev glu and chronic moderate LBP symptoms controlled with medicines. His brother died of a MI at 84. F/u memory issues - some better Not loosing wt lately.   Review of Systems  Constitutional: Negative for appetite change, fatigue and unexpected weight change.  HENT: Negative for congestion, nosebleeds, sneezing, sore throat and trouble swallowing.   Eyes: Negative for itching and visual disturbance.  Respiratory: Negative for cough.   Cardiovascular: Negative for chest pain, palpitations and leg swelling.  Gastrointestinal: Negative for nausea, diarrhea, blood in stool and abdominal distention.  Genitourinary: Negative for frequency and hematuria.  Musculoskeletal: Negative for back pain, gait problem, joint swelling and neck pain.  Skin: Negative for rash.  Neurological: Negative for dizziness, tremors, speech difficulty and weakness.  Psychiatric/Behavioral: Negative for suicidal ideas, sleep disturbance, dysphoric mood and agitation. The patient is not nervous/anxious.    Wt Readings from Last 3 Encounters:  06/08/13 300 lb (136.079 kg)  03/04/13 297 lb (134.718 kg)  12/22/12 294 lb (133.358 kg)   BP Readings from Last 3 Encounters:  06/08/13 140/86  03/04/13 122/80  12/22/12 130/78       Objective:   Physical Exam  Constitutional: He is oriented to person, place, and time. He appears well-developed. No distress.  obese  HENT:  Mouth/Throat: Oropharynx is clear and moist.  Eyes: Conjunctivae are normal. Pupils are equal, round, and reactive to light.  Neck: Normal range of motion. No JVD present. No thyromegaly present.  Cardiovascular: Normal rate, regular rhythm, normal heart sounds and intact distal pulses.  Exam reveals no gallop and no friction rub.   No murmur  heard. Pulmonary/Chest: Effort normal and breath sounds normal. No respiratory distress. He has no wheezes. He has no rales. He exhibits no tenderness.  Abdominal: Soft. Bowel sounds are normal. He exhibits no distension and no mass. There is no tenderness. There is no rebound and no guarding.  Musculoskeletal: Normal range of motion. He exhibits no edema and no tenderness.  Lymphadenopathy:    He has no cervical adenopathy.  Neurological: He is alert and oriented to person, place, and time. He has normal reflexes. No cranial nerve deficit. He exhibits normal muscle tone. Coordination normal.  Skin: Skin is warm and dry. No rash noted.  Psychiatric: He has a normal mood and affect. His behavior is normal. Judgment and thought content normal.    Lab Results  Component Value Date   WBC 9.0 05/28/2012   HGB 16.2 05/28/2012   HCT 46.5 05/28/2012   PLT 161.0 05/28/2012   GLUCOSE 99 12/22/2012   CHOL 131 12/22/2012   TRIG 149.0 12/22/2012   HDL 41.80 12/22/2012   LDLDIRECT 48.0 05/28/2012   LDLCALC 59 12/22/2012   ALT 38 12/22/2012   AST 25 12/22/2012   NA 139 12/22/2012   K 4.2 12/22/2012   CL 104 12/22/2012   CREATININE 0.9 12/22/2012   BUN 17 12/22/2012   CO2 28 12/22/2012   TSH 2.08 05/28/2012   PSA 0.36 05/28/2012   INR 1.1* 02/06/2012   HGBA1C 5.6 03/08/2011         Assessment & Plan:

## 2013-06-11 ENCOUNTER — Telehealth: Payer: Self-pay | Admitting: Internal Medicine

## 2013-06-11 NOTE — Telephone Encounter (Signed)
Relevant patient education mailed to patient.  

## 2013-07-01 ENCOUNTER — Telehealth: Payer: Self-pay | Admitting: Internal Medicine

## 2013-07-01 MED ORDER — TRIAMCINOLONE ACETONIDE 0.5 % EX CREA: TOPICAL_CREAM | Freq: Three times a day (TID) | CUTANEOUS | Status: DC

## 2013-07-01 NOTE — Telephone Encounter (Signed)
Pt request better explaination about the bill he got from the office visit of 06/08/13 with Dr. Camila Li. Pt stated he was told to pay $80 for self pay for copy but he received a bill from [facility] for $42.50. Pt stated he spoke with the billing and they can not tell him why he got a bill for more money when the doctor did not do anything extra for him at the office visit. Please help.

## 2013-07-01 NOTE — Telephone Encounter (Signed)
Pt called request medication for rash on stomach. Pt stated that Dr. Camila Li gave this medication couple years ago (can not remember the name of it) and it work really good. Please advise.

## 2013-07-01 NOTE — Telephone Encounter (Signed)
OK Triamc cream - emailed Thx

## 2013-07-29 ENCOUNTER — Other Ambulatory Visit: Payer: Self-pay | Admitting: Internal Medicine

## 2013-08-02 NOTE — Telephone Encounter (Signed)
Pt called to check up on this med request.

## 2013-08-03 ENCOUNTER — Telehealth: Payer: Self-pay | Admitting: Internal Medicine

## 2013-08-03 ENCOUNTER — Other Ambulatory Visit: Payer: Self-pay

## 2013-08-03 NOTE — Telephone Encounter (Signed)
Pt is calling to see when the xanax RX will be ready.  Will he need to come here to pick it up?

## 2013-08-04 NOTE — Telephone Encounter (Signed)
OK to fill this prescription with additional refills x2 Thank you!  

## 2013-08-05 NOTE — Telephone Encounter (Signed)
rx called in to [facility] 

## 2013-09-08 ENCOUNTER — Encounter: Payer: Self-pay | Admitting: Internal Medicine

## 2013-09-08 ENCOUNTER — Ambulatory Visit (INDEPENDENT_AMBULATORY_CARE_PROVIDER_SITE_OTHER): Payer: BC Managed Care – PPO | Admitting: Internal Medicine

## 2013-09-08 VITALS — BP 112/80 | HR 80 | Temp 98.5°F | Resp 16 | Wt 299.0 lb

## 2013-09-08 DIAGNOSIS — J209 Acute bronchitis, unspecified: Secondary | ICD-10-CM

## 2013-09-08 DIAGNOSIS — G40909 Epilepsy, unspecified, not intractable, without status epilepticus: Secondary | ICD-10-CM

## 2013-09-08 DIAGNOSIS — J988 Other specified respiratory disorders: Secondary | ICD-10-CM

## 2013-09-08 DIAGNOSIS — I2510 Atherosclerotic heart disease of native coronary artery without angina pectoris: Secondary | ICD-10-CM

## 2013-09-08 DIAGNOSIS — M545 Low back pain, unspecified: Secondary | ICD-10-CM

## 2013-09-08 DIAGNOSIS — H6120 Impacted cerumen, unspecified ear: Secondary | ICD-10-CM

## 2013-09-08 DIAGNOSIS — F411 Generalized anxiety disorder: Secondary | ICD-10-CM

## 2013-09-08 MED ORDER — TRAMADOL HCL 50 MG PO TABS: 50.0000 mg | ORAL_TABLET | Freq: Two times a day (BID) | ORAL | Status: DC

## 2013-09-08 MED ORDER — NAPROXEN 500 MG PO TABS: ORAL_TABLET | ORAL | Status: DC

## 2013-09-08 MED ORDER — METOPROLOL TARTRATE 25 MG PO TABS: 25.0000 mg | ORAL_TABLET | Freq: Two times a day (BID) | ORAL | Status: DC

## 2013-09-08 MED ORDER — ALPRAZOLAM 2 MG PO TABS: ORAL_TABLET | ORAL | Status: DC

## 2013-09-08 MED ORDER — AZITHROMYCIN 250 MG PO TABS: ORAL_TABLET | ORAL | Status: DC

## 2013-09-08 MED ORDER — LORATADINE 10 MG PO TABS: 10.0000 mg | ORAL_TABLET | Freq: Every day | ORAL | Status: DC | PRN

## 2013-09-08 MED ORDER — LOSARTAN POTASSIUM 100 MG PO TABS: 100.0000 mg | ORAL_TABLET | Freq: Every day | ORAL | Status: DC

## 2013-09-08 MED ORDER — ATORVASTATIN CALCIUM 20 MG PO TABS: 20.0000 mg | ORAL_TABLET | Freq: Every day | ORAL | Status: DC

## 2013-09-08 NOTE — Assessment & Plan Note (Signed)
Wt Readings from Last 3 Encounters:  09/08/13 299 lb (135.626 kg)  06/08/13 300 lb (136.079 kg)  03/04/13 297 lb (134.718 kg)

## 2013-09-08 NOTE — Progress Notes (Signed)
Pre visit review using our clinic review tool, if applicable. No additional management support is needed unless otherwise documented below in the visit note. 

## 2013-09-08 NOTE — Assessment & Plan Note (Signed)
Continue with current prescription therapy as reflected on the Med list.  

## 2013-09-08 NOTE — Assessment & Plan Note (Signed)
No relapse He has occ tremors in L hand

## 2013-09-08 NOTE — Assessment & Plan Note (Signed)
z pac 

## 2013-09-08 NOTE — Progress Notes (Signed)
   Subjective:     HPI  Shaquan got re-married in 8/14  C/o ST and L earache x 1-2 wks - new  F/u some CPs, SOB - smoking 1-2 ppd, gained wt The patient is here to follow up on chronic HTN, anxiety, elev glu and chronic moderate LBP symptoms controlled with medicines. His brother died of a MI at 50. F/u memory issues - some better Not loosing wt lately.   Review of Systems  Constitutional: Negative for appetite change, fatigue and unexpected weight change.  HENT: Negative for congestion, nosebleeds, sneezing, sore throat and trouble swallowing.   Eyes: Negative for itching and visual disturbance.  Respiratory: Negative for cough.   Cardiovascular: Negative for chest pain, palpitations and leg swelling.  Gastrointestinal: Negative for nausea, diarrhea, blood in stool and abdominal distention.  Genitourinary: Negative for frequency and hematuria.  Musculoskeletal: Negative for back pain, gait problem, joint swelling and neck pain.  Skin: Negative for rash.  Neurological: Negative for dizziness, tremors, speech difficulty and weakness.  Psychiatric/Behavioral: Negative for suicidal ideas, sleep disturbance, dysphoric mood and agitation. The patient is not nervous/anxious.    Wt Readings from Last 3 Encounters:  09/08/13 299 lb (135.626 kg)  06/08/13 300 lb (136.079 kg)  03/04/13 297 lb (134.718 kg)   BP Readings from Last 3 Encounters:  09/08/13 112/80  06/08/13 140/86  03/04/13 122/80       Objective:   Physical Exam  Constitutional: He is oriented to person, place, and time. He appears well-developed. No distress.  obese  HENT:  Mouth/Throat: Oropharynx is clear and moist.  Eyes: Conjunctivae are normal. Pupils are equal, round, and reactive to light.  Neck: Normal range of motion. No JVD present. No thyromegaly present.  Cardiovascular: Normal rate, regular rhythm, normal heart sounds and intact distal pulses.  Exam reveals no gallop and no friction rub.   No murmur  heard. Pulmonary/Chest: Effort normal and breath sounds normal. No respiratory distress. He has no wheezes. He has no rales. He exhibits no tenderness.  Abdominal: Soft. Bowel sounds are normal. He exhibits no distension and no mass. There is no tenderness. There is no rebound and no guarding.  Musculoskeletal: Normal range of motion. He exhibits no edema and no tenderness.  Lymphadenopathy:    He has no cervical adenopathy.  Neurological: He is alert and oriented to person, place, and time. He has normal reflexes. No cranial nerve deficit. He exhibits normal muscle tone. Coordination normal.  Skin: Skin is warm and dry. No rash noted.  Psychiatric: He has a normal mood and affect. His behavior is normal. Judgment and thought content normal.    Lab Results  Component Value Date   WBC 9.0 05/28/2012   HGB 16.2 05/28/2012   HCT 46.5 05/28/2012   PLT 161.0 05/28/2012   GLUCOSE 99 12/22/2012   CHOL 131 12/22/2012   TRIG 149.0 12/22/2012   HDL 41.80 12/22/2012   LDLDIRECT 48.0 05/28/2012   LDLCALC 59 12/22/2012   ALT 38 12/22/2012   AST 25 12/22/2012   NA 139 12/22/2012   K 4.2 12/22/2012   CL 104 12/22/2012   CREATININE 0.9 12/22/2012   BUN 17 12/22/2012   CO2 28 12/22/2012   TSH 2.08 05/28/2012   PSA 0.36 05/28/2012   INR 1.1* 02/06/2012   HGBA1C 5.6 03/08/2011         Assessment & Plan:

## 2013-09-08 NOTE — Assessment & Plan Note (Signed)
Zpac 

## 2013-09-08 NOTE — Assessment & Plan Note (Signed)
He will irrigate

## 2013-09-08 NOTE — Assessment & Plan Note (Signed)
Risk of MI in smokers was discussed

## 2013-09-21 IMAGING — CR DG CHEST 2V
2 series · 2 of 2 positions shown · non-contrast
Comparison: CTA chest 05/07/2011.  Two-view chest x-ray 07/23/2003.

CLINICAL DATA: Smoker with prior history of MI and current history
of hypertension, presenting with cough, shortness of breath, and
chest pain.

CHEST - 2 VIEW

[view not recorded (1 of 2)]
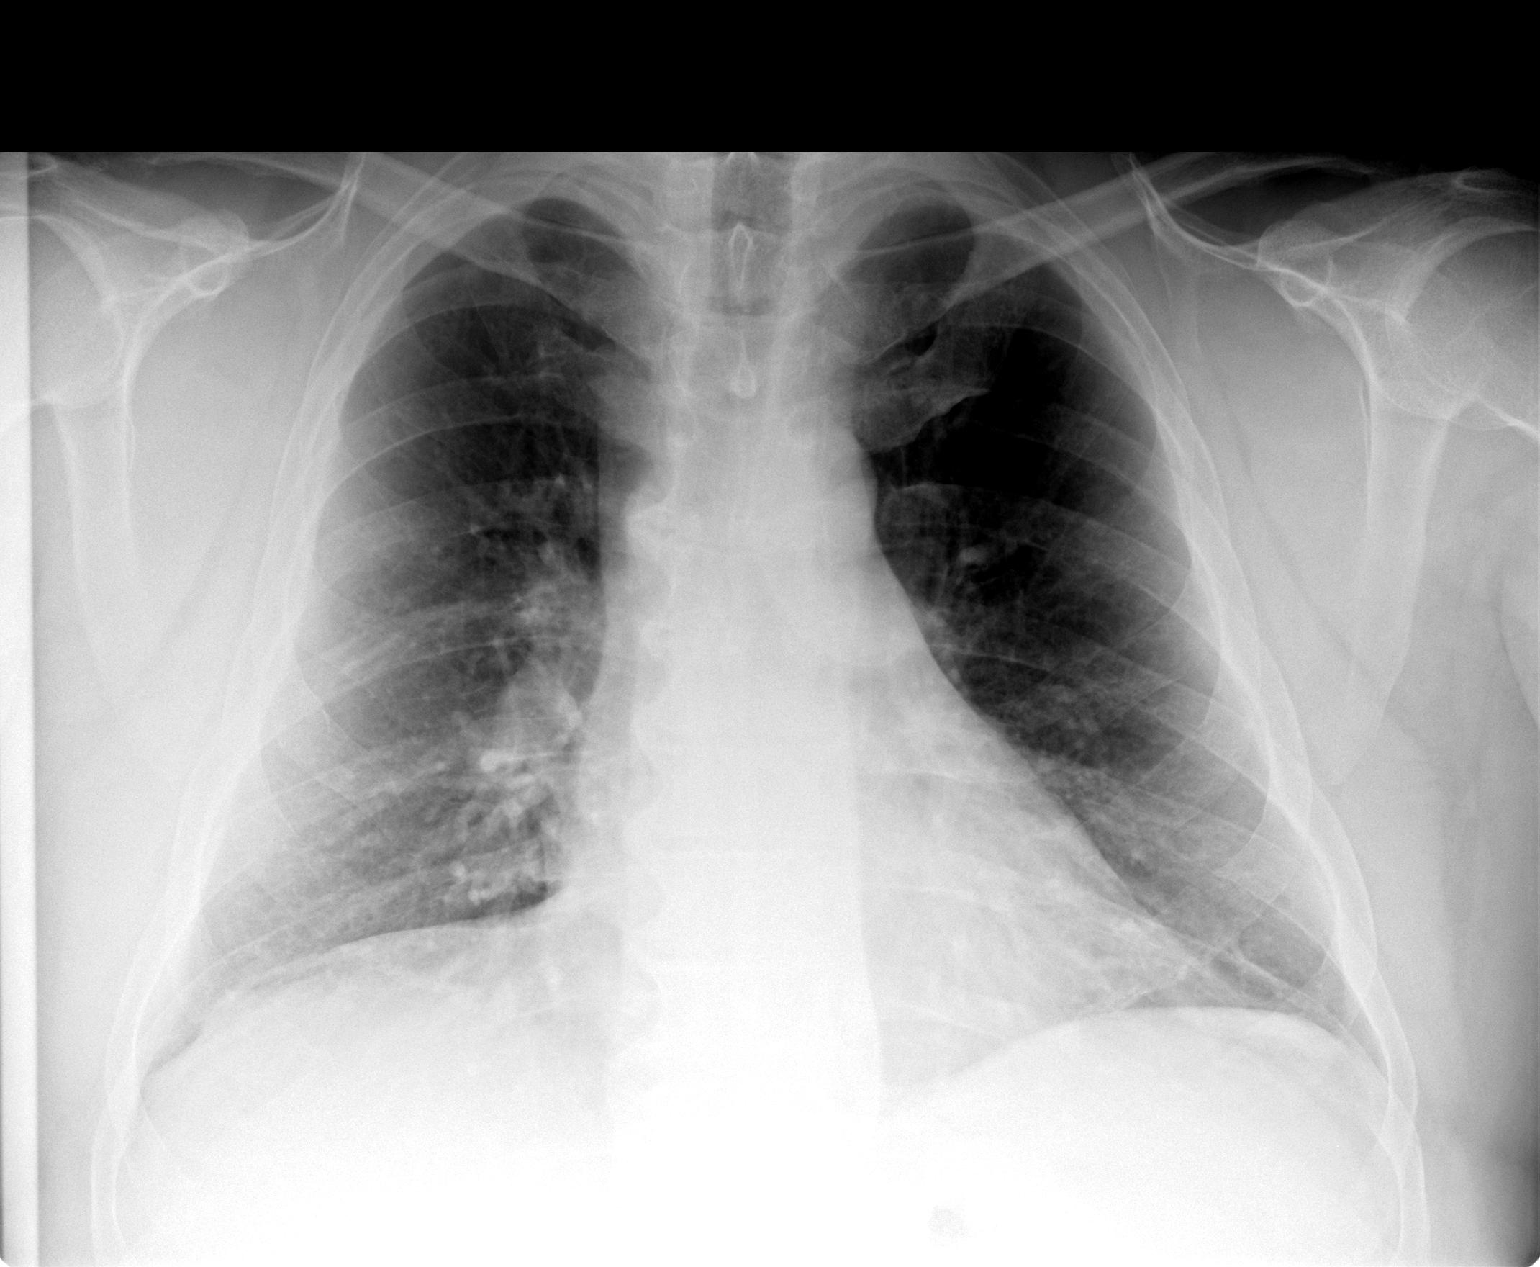

[view not recorded (2 of 2)]
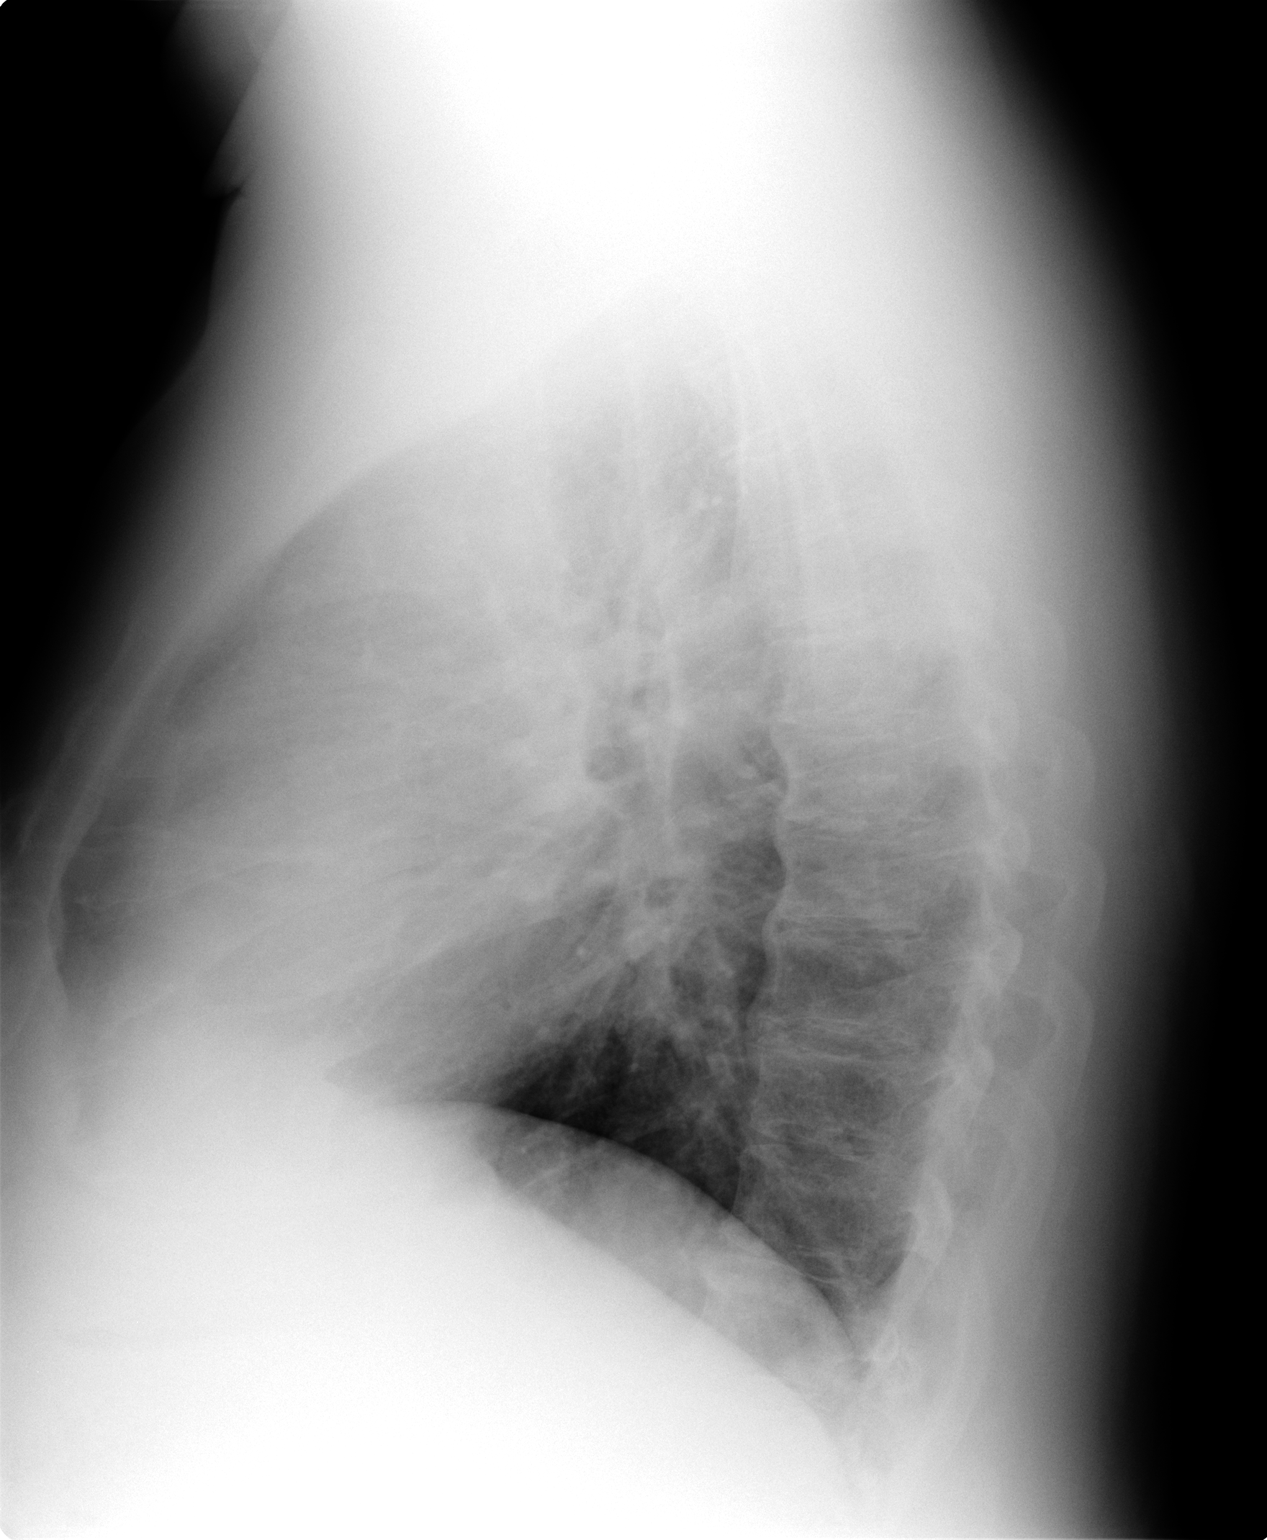

[2 of 2 positions shown; findings below may reference images not displayed]

FINDINGS: Cardiac silhouette normal in size, unchanged.  Hilar and
mediastinal contours unremarkable.  Mildly prominent
bronchovascular markings diffusely and mild central peribronchial
thickening, unchanged.  Lungs otherwise clear.  No pneumothorax.
No pleural effusions. Degenerative changes and DISH involving the
thoracic spine.
IMPRESSION: Stable mild changes of chronic bronchitis and/or asthma.  No acute
cardiopulmonary disease.

## 2013-10-12 ENCOUNTER — Emergency Department (HOSPITAL_BASED_OUTPATIENT_CLINIC_OR_DEPARTMENT_OTHER): Payer: BC Managed Care – PPO

## 2013-10-12 ENCOUNTER — Encounter (HOSPITAL_BASED_OUTPATIENT_CLINIC_OR_DEPARTMENT_OTHER): Payer: Self-pay | Admitting: Emergency Medicine

## 2013-10-12 ENCOUNTER — Emergency Department (HOSPITAL_BASED_OUTPATIENT_CLINIC_OR_DEPARTMENT_OTHER)
Admission: EM | Admit: 2013-10-12 | Discharge: 2013-10-12 | Disposition: A | Discharge status: Home / Self Care | Payer: BC Managed Care – PPO | Attending: Emergency Medicine | Admitting: Emergency Medicine

## 2013-10-12 DIAGNOSIS — Z7982 Long term (current) use of aspirin: Secondary | ICD-10-CM | POA: Insufficient documentation

## 2013-10-12 DIAGNOSIS — R5381 Other malaise: Secondary | ICD-10-CM | POA: Insufficient documentation

## 2013-10-12 DIAGNOSIS — R209 Unspecified disturbances of skin sensation: Secondary | ICD-10-CM | POA: Insufficient documentation

## 2013-10-12 DIAGNOSIS — R42 Dizziness and giddiness: Secondary | ICD-10-CM | POA: Insufficient documentation

## 2013-10-12 DIAGNOSIS — G4733 Obstructive sleep apnea (adult) (pediatric): Secondary | ICD-10-CM | POA: Insufficient documentation

## 2013-10-12 DIAGNOSIS — Z791 Long term (current) use of non-steroidal anti-inflammatories (NSAID): Secondary | ICD-10-CM | POA: Insufficient documentation

## 2013-10-12 DIAGNOSIS — F17200 Nicotine dependence, unspecified, uncomplicated: Secondary | ICD-10-CM | POA: Insufficient documentation

## 2013-10-12 DIAGNOSIS — F411 Generalized anxiety disorder: Secondary | ICD-10-CM | POA: Insufficient documentation

## 2013-10-12 DIAGNOSIS — I2510 Atherosclerotic heart disease of native coronary artery without angina pectoris: Secondary | ICD-10-CM | POA: Insufficient documentation

## 2013-10-12 DIAGNOSIS — R0789 Other chest pain: Secondary | ICD-10-CM | POA: Insufficient documentation

## 2013-10-12 DIAGNOSIS — E669 Obesity, unspecified: Secondary | ICD-10-CM | POA: Insufficient documentation

## 2013-10-12 DIAGNOSIS — Z8719 Personal history of other diseases of the digestive system: Secondary | ICD-10-CM | POA: Insufficient documentation

## 2013-10-12 DIAGNOSIS — Z79899 Other long term (current) drug therapy: Secondary | ICD-10-CM | POA: Insufficient documentation

## 2013-10-12 DIAGNOSIS — R0989 Other specified symptoms and signs involving the circulatory and respiratory systems: Secondary | ICD-10-CM | POA: Insufficient documentation

## 2013-10-12 DIAGNOSIS — E785 Hyperlipidemia, unspecified: Secondary | ICD-10-CM | POA: Insufficient documentation

## 2013-10-12 DIAGNOSIS — IMO0002 Reserved for concepts with insufficient information to code with codable children: Secondary | ICD-10-CM | POA: Insufficient documentation

## 2013-10-12 DIAGNOSIS — I252 Old myocardial infarction: Secondary | ICD-10-CM | POA: Insufficient documentation

## 2013-10-12 DIAGNOSIS — R079 Chest pain, unspecified: Secondary | ICD-10-CM

## 2013-10-12 DIAGNOSIS — Z8669 Personal history of other diseases of the nervous system and sense organs: Secondary | ICD-10-CM | POA: Insufficient documentation

## 2013-10-12 DIAGNOSIS — R5383 Other fatigue: Secondary | ICD-10-CM

## 2013-10-12 DIAGNOSIS — Z792 Long term (current) use of antibiotics: Secondary | ICD-10-CM | POA: Insufficient documentation

## 2013-10-12 LAB — BASIC METABOLIC PANEL
BUN: 19 mg/dL (ref 6–23)
CO2: 24 mEq/L (ref 19–32)
CREATININE: 0.8 mg/dL (ref 0.50–1.35)
Calcium: 9.3 mg/dL (ref 8.4–10.5)
Chloride: 107 mEq/L (ref 96–112)
Glucose, Bld: 104 mg/dL — ABNORMAL HIGH (ref 70–99)
POTASSIUM: 4.1 meq/L (ref 3.7–5.3)
Sodium: 142 mEq/L (ref 137–147)

## 2013-10-12 LAB — CBC WITH DIFFERENTIAL/PLATELET
Basophils Absolute: 0 10*3/uL (ref 0.0–0.1)
Basophils Relative: 0 % (ref 0–1)
Eosinophils Absolute: 0.3 10*3/uL (ref 0.0–0.7)
Eosinophils Relative: 4 % (ref 0–5)
HCT: 45.2 % (ref 39.0–52.0)
HEMOGLOBIN: 16.1 g/dL (ref 13.0–17.0)
Lymphocytes Relative: 36 % (ref 12–46)
Lymphs Abs: 2.5 10*3/uL (ref 0.7–4.0)
MCH: 32.3 pg (ref 26.0–34.0)
MCHC: 35.6 g/dL (ref 30.0–36.0)
MCV: 90.6 fL (ref 78.0–100.0)
Monocytes Absolute: 0.7 10*3/uL (ref 0.1–1.0)
Monocytes Relative: 10 % (ref 3–12)
NEUTROS ABS: 3.5 10*3/uL (ref 1.7–7.7)
NEUTROS PCT: 50 % (ref 43–77)
Platelets: 140 10*3/uL — ABNORMAL LOW (ref 150–400)
RBC: 4.99 MIL/uL (ref 4.22–5.81)
RDW: 13.3 % (ref 11.5–15.5)
WBC: 7 10*3/uL (ref 4.0–10.5)

## 2013-10-12 LAB — TROPONIN I: Troponin I: <0.3 ng/mL (ref <0.30)

## 2013-10-12 LAB — D-DIMER, QUANTITATIVE (NOT AT ARMC): D-Dimer, Quant: <0.27 ug/mL-FEU (ref 0.00–0.48)

## 2013-10-12 MED ORDER — MORPHINE SULFATE 4 MG/ML IJ SOLN
4.0000 mg | Freq: Once | INTRAMUSCULAR | Status: AC
  Administered 2013-10-12: 4 mg via INTRAVENOUS
  Filled 2013-10-12: qty 1

## 2013-10-12 NOTE — Discharge Instructions (Signed)
See her her doctor tomorrow for further evaluation. Return to the emergency department if you change your mind And would like to be observed for further workup of your heart in the hospital.Take aspirin daily.  If you were given medicines take as directed.  If you are on coumadin or contraceptives realize their levels and effectiveness is altered by many different medicines.  If you have any reaction (rash, tongues swelling, other) to the medicines stop taking and see a physician.   Please follow up as directed and return to the ER or see a physician for new or worsening symptoms.  Thank you. Filed Vitals:   10/12/13 2145 10/12/13 2200 10/12/13 2256 10/12/13 2258  BP:  126/68 119/72 119/72  Pulse: 71 72 76 69  Temp:      TempSrc:      Resp:    18  Height:      Weight:      SpO2: 96% 96% 96% 100%    Chest Pain (Nonspecific) It is often hard to give a specific diagnosis for the cause of chest pain. There is always a chance that your pain could be related to something serious, such as a heart attack or a blood clot in the lungs. You need to follow up with your caregiver for further evaluation. CAUSES   Heartburn.  Pneumonia or bronchitis.  Anxiety or stress.  Inflammation around your heart (pericarditis) or lung (pleuritis or pleurisy).  A blood clot in the lung.  A collapsed lung (pneumothorax). It can develop suddenly on its own (spontaneous pneumothorax) or from injury (trauma) to the chest.  Shingles infection (herpes zoster virus). The chest wall is composed of bones, muscles, and cartilage. Any of these can be the source of the pain.  The bones can be bruised by injury.  The muscles or cartilage can be strained by coughing or overwork.  The cartilage can be affected by inflammation and become sore (costochondritis). DIAGNOSIS  Lab tests or other studies, such as X-rays, electrocardiography, stress testing, or cardiac imaging, may be needed to find the cause of your pain.    TREATMENT   Treatment depends on what may be causing your chest pain. Treatment may include:  Acid blockers for heartburn.  Anti-inflammatory medicine.  Pain medicine for inflammatory conditions.  Antibiotics if an infection is present.  You may be advised to change lifestyle habits. This includes stopping smoking and avoiding alcohol, caffeine, and chocolate.  You may be advised to keep your head raised (elevated) when sleeping. This reduces the chance of acid going backward from your stomach into your esophagus.  Most of the time, nonspecific chest pain will improve within 2 to 3 days with rest and mild pain medicine. HOME CARE INSTRUCTIONS   If antibiotics were prescribed, take your antibiotics as directed. Finish them even if you start to feel better.  For the next few days, avoid physical activities that bring on chest pain. Continue physical activities as directed.  Do not smoke.  Avoid drinking alcohol.  Only take over-the-counter or prescription medicine for pain, discomfort, or fever as directed by your caregiver.  Follow your caregiver's suggestions for further testing if your chest pain does not go away.  Keep any follow-up appointments you made. If you do not go to an appointment, you could develop lasting (chronic) problems with pain. If there is any problem keeping an appointment, you must call to reschedule. SEEK MEDICAL CARE IF:   You think you are having problems from the medicine  you are taking. Read your medicine instructions carefully.  Your chest pain does not go away, even after treatment.  You develop a rash with blisters on your chest. SEEK IMMEDIATE MEDICAL CARE IF:   You have increased chest pain or pain that spreads to your arm, neck, jaw, back, or abdomen.  You develop shortness of breath, an increasing cough, or you are coughing up blood.  You have severe back or abdominal pain, feel nauseous, or vomit.  You develop severe weakness,  fainting, or chills.  You have a fever. THIS IS AN EMERGENCY. Do not wait to see if the pain will go away. Get medical help at once. Call your local emergency services (911 in [place] Do not drive yourself to the hospital. MAKE SURE YOU:   Understand these instructions.  Will watch your condition.  Will get help right away if you are not doing well or get worse. Document Released: 02/06/2005 Document Revised: 07/22/2011 Document Reviewed: 12/03/2007 [facility] Patient Information 2014 [place] [facility].

## 2013-10-12 NOTE — ED Notes (Signed)
Patient transported to X-ray 

## 2013-10-12 NOTE — ED Provider Notes (Signed)
CSN: [id number]     Arrival date & time 10/12/13  1843 History  This chart was scribed for Nathan Clonts, MD by Vernell Barrier, ED scribe. This patient was seen in room MH01/MH01 and the patient's care was started at 7:50 PM.    Chief Complaint  Patient presents with  . Chest Pain    Patient is a 53 y.o. male presenting with chest pain. The history is provided by the patient. No language interpreter was used.  Chest Pain Pain location:  L chest Pain quality: sharp and tightness   Pain radiates to:  L arm Pain radiates to the back: no   Pain severity:  No pain Onset quality:  Gradual Duration:  3 hours Timing:  Intermittent Chronicity:  Recurrent Relieved by:  Nothing Worsened by:  Nothing tried Ineffective treatments:  Aspirin Associated symptoms: dizziness, numbness and weakness   Associated symptoms: no abdominal pain and no shortness of breath   Risk factors: coronary artery disease   Risk factors: no high cholesterol and no hypertension    HPI Comments: Tavious Griesinger is a 53 y.o. male w/ hx of heart problems presents to the Emergency Department complaining of intermittent left sided chest pain radiating down the left arm; onset 3 hours ago. Pain is not present at the moment. Describes pain as a tightness with occasional sharp pain. Episodes last minutes at a time. Associated weakness, numbness at the lips, diaphoresis, and dizziness. Took 3 aspirin today after pain presented. Hx of cocaine use. No hx of diabetes, high cholesterol, or blood clots. Family hx of heart attacks resulting in death and heart disease. Smokes 2-3 packs of cigarettes per day which results in some intermittent cough. Denies rash, HA, visual change, neck pain, sore throat, abdominal pain, nausea, diarrhea, SOB.  Past Medical History  Diagnosis Date  . Anxiety   . GERD (gastroesophageal reflux disease)   . Hyperlipidemia   . Diabetes mellitus      II, borderline, diet controlled  .  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 CAD (coronary artery disease)   . OSA (obstructive sleep apnea)     cpap -PSG apr30 2009, [facility]  119  . Pain, low back   . Tobacco use disorder   . Cocaine abuse     none now  . Obesity, unspecified    Past Surgical History  Procedure Laterality Date  . Fracture surgery      R. leg  . Fracture surgery      L. arm  . Back surgery      x 2   Family History  Problem Relation Age of Onset  . Heart attack Father 34    grandfather died MI 34  . Heart disease Father     cad  . Heart attack    . Atrial fibrillation Other   . Cancer Mother     lung ca   History  Substance Use Topics  . Smoking status: Current Every Day Smoker -- 1.50 packs/day for 25 years    Types: Cigarettes  . Smokeless tobacco: Never Used  . Alcohol Use: 6.0 oz/week    12 drink(s) per week    Review of Systems  HENT: Negative for sore throat.   Eyes: Negative for visual disturbance.  Respiratory: Negative for shortness of breath.   Cardiovascular: Positive for chest pain.  Gastrointestinal: Negative for abdominal pain and diarrhea.  Musculoskeletal: Negative for neck pain.  Skin: Negative for  rash.  Neurological: Positive for dizziness, weakness and numbness.  All other systems reviewed and are negative.  Allergies  Ace inhibitors and Lisinopril  Home Medications   Prior to Admission medications   Medication Sig Start Date End Date Taking? Authorizing Provider  alprazolam (XANAX) 2 MG tablet TAKE 1 TABLET BY MOUTH 3 TIMES DAILY AS NEEDED FOR ANXIETY. 09/08/13   Lew Dawes V, MD  aspirin EC 81 MG tablet Take 1 tablet (81 mg total) by mouth daily. 02/05/12   Larey Dresser, MD  atorvastatin (LIPITOR) 20 MG tablet Take 1 tablet (20 mg total) by mouth daily. 09/08/13   Aleksei Plotnikov V, MD  Azelastine-Fluticasone (DYMISTA) 137-50 MCG/ACT SUSP Place 1 Act into the nose 1 day or 1 dose. 06/10/11   Aleksei Plotnikov V, MD   azithromycin (ZITHROMAX) 250 MG tablet As directed 09/08/13   Lew Dawes V, MD  buPROPion (ZYBAN) 150 MG 12 hr tablet Take 1 tablet (150 mg total) by mouth 2 (two) times daily. 03/03/12   Larey Dresser, MD  Fluticasone-Salmeterol (ADVAIR DISKUS) 250-50 MCG/DOSE AEPB Inhale 1 puff into the lungs 2 (two) times daily. 06/10/11   Aleksei Plotnikov V, MD  loratadine (CLARITIN) 10 MG tablet Take 1 tablet (10 mg total) by mouth daily as needed for allergies. 09/08/13   Aleksei Plotnikov V, MD  losartan (COZAAR) 100 MG tablet Take 1 tablet (100 mg total) by mouth daily. 09/08/13 09/08/14  Aleksei Plotnikov V, MD  meclizine (ANTIVERT) 50 MG tablet Take 1 tablet (50 mg total) by mouth 3 (three) times daily as needed. 05/28/12   Webb Silversmith, NP  metoprolol tartrate (LOPRESSOR) 25 MG tablet Take 1 tablet (25 mg total) by mouth 2 (two) times daily. 09/08/13   Aleksei Plotnikov V, MD  naproxen (NAPROSYN) 500 MG tablet TAKE 1 TABLET BY MOUTH DAILY AS NEEDED 09/08/13   Aleksei Plotnikov V, MD  nitroGLYCERIN (NITROSTAT) 0.4 MG SL tablet Place 1 tablet (0.4 mg total) under the tongue every 5 (five) minutes as needed. 12/22/12   Aleksei Plotnikov V, MD  traMADol (ULTRAM) 50 MG tablet Take 1-2 tablets (50-100 mg total) by mouth 2 (two) times daily. 09/08/13   Aleksei Plotnikov V, MD  triamcinolone cream (KENALOG) 0.5 % Apply topically 3 (three) times daily. 07/01/13 07/01/14  Aleksei Plotnikov V, MD   BP 143/76  Pulse 87  Temp(Src) 97.6 F (36.4 C) (Oral)  Resp 20  Ht 6' (1.829 m)  Wt 300 lb (136.079 kg)  BMI 40.68 kg/m2  SpO2 96%  Physical Exam  Nursing note and vitals reviewed. Constitutional: He is oriented to person, place, and time. He appears well-developed and well-nourished. No distress.  HENT:  Head: Normocephalic and atraumatic.  Mouth/Throat: Oropharynx is clear and moist.  Eyes: Conjunctivae and EOM are normal. Pupils are equal, round, and reactive to light.  Neck: Normal range of motion. Neck  supple. No tracheal deviation present.  Cardiovascular: Normal rate, regular rhythm, normal heart sounds and intact distal pulses.   No murmur heard. Pulmonary/Chest: Effort normal. No respiratory distress. He has rales.  Few sparse rales at the base on the right. No distress.   Abdominal: Soft. There is no tenderness.  Musculoskeletal: Normal range of motion. He exhibits no edema and no tenderness.  Neurological: He is alert and oriented to person, place, and time.  Skin: Skin is warm and dry.  Psychiatric: He has a normal mood and affect. His behavior is normal.   ED Course  Procedures (including critical  care time) DIAGNOSTIC STUDIES: Oxygen Saturation is 96% on room air, normal by my interpretation.    COORDINATION OF CARE: At 7:55 PM: Discussed treatment plan with patient which includes cardiac work-up. Patient agrees.   Labs Review Labs Reviewed - No data to display  Imaging Review Dg Chest 2 View  10/12/2013   CLINICAL DATA:  Chest pain.  EXAM: CHEST  2 VIEW  COMPARISON:  12/22/2012.  FINDINGS: The cardiac silhouette, mediastinal and hilar contours are within normal limits and stable. The lungs are clear of acute process. No pleural effusion. The bony thorax is intact.  IMPRESSION: No acute cardiopulmonary findings.   Electronically Signed   By: Kalman Jewels M.D.   On: 10/12/2013 20:46     EKG Interpretation   Date/Time:  Tuesday October 12 2013 18:52:37 EDT Ventricular Rate:  84 PR Interval:  138 QRS Duration: 90 QT Interval:  358 QTC Calculation: 423 R Axis:   41 Text Interpretation:  Normal sinus rhythm Normal ECG Confirmed by Jelan Batterton   MD, Alizay Bronkema ([id number]) on 10/12/2013 7:47:14 PM      MDM   Final diagnoses:  Chest pain  CAD (coronary artery disease)    I personally performed the services described in this documentation, which was scribed in my presence. The recorded information has been reviewed and is accurate.  Patient with known coronary artery disease  presented with chest tightness. Patient low risk for pulmonary embolism with mild pleuritic pain d-dimer ordered negative. Delta troponin negative and EKG no acute findings. Patient's symptoms improved on recheck in ED. I discussed observation in the hospital with known disease and worsening chest pain however patient asking to be discharged and he said he will call his cardiologist tomorrow. He does not want to be admitted or observed any further. He has capacity to make decisions and understands he could have worsening heart disease which could lead to heart attack which could lead to death. He understands risks and benefits of further evaluation and will be discharged by his choice.  Filed Vitals:   10/12/13 2145 10/12/13 2200 10/12/13 2256 10/12/13 2258  BP:  126/68 119/72 119/72  Pulse: 71 72 76 69  Temp:      TempSrc:      Resp:    18  Height:      Weight:      SpO2: 96% 96% 96% 100%      Nathan Clonts, MD 10/12/13 2309

## 2013-10-12 NOTE — ED Notes (Signed)
MD at bedside. 

## 2013-10-12 NOTE — ED Notes (Signed)
Pt reports no change in type of pain, pt states that he feels like it is "lung pain", a little worse with inspiration, vs stable, family at bedside

## 2013-10-12 NOTE — ED Notes (Addendum)
Left sided chest tightness x2 hours.  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  Sts he has had similar episodes recently. Last week he used his sl ntg and stopped the pain.  Has not called pmd.

## 2013-10-12 NOTE — ED Notes (Signed)
Lengthy discussion with patient and family, regarding high risk of cardiac issues. Pt stating he does not want to go to hospital, family in agreement with poc. Plans to call and follow up with cardiologist in am

## 2013-10-15 ENCOUNTER — Ambulatory Visit: Payer: Self-pay | Admitting: Cardiology

## 2013-10-15 ENCOUNTER — Telehealth: Payer: Self-pay | Admitting: Cardiology

## 2013-10-15 NOTE — Telephone Encounter (Signed)
Follow up     Pt called to report could not come in work kept him.  Pt would like a call back to schedule a CATH please he thinks he needs this.

## 2013-10-15 NOTE — Telephone Encounter (Signed)
Calling stating he was in ER on 6/2 for chest pain.  Was told to fu with Dr. Aundra Dubin.  Had an appointment today but had to cancel due to work.  States he was told his EKG and labs were normal.  He wants to just schedule heart cath without coming into office.  Advised we usually do not schedule a cath without being seen.  Will speak w/Dr. Aundra Dubin.  Dr. Aundra Dubin states he can not legally schedule cath without documentation.  Dr. Aundra Dubin will see him here in office Monday 6/8 at 4:00 pm.  Pt states he will be here at 4:00 pm.

## 2013-10-18 ENCOUNTER — Ambulatory Visit (INDEPENDENT_AMBULATORY_CARE_PROVIDER_SITE_OTHER): Payer: BC Managed Care – PPO | Admitting: Cardiology

## 2013-10-18 ENCOUNTER — Encounter: Payer: Self-pay | Admitting: Cardiology

## 2013-10-18 VITALS — BP 142/70 | HR 85 | Wt 301.0 lb

## 2013-10-18 DIAGNOSIS — I2510 Atherosclerotic heart disease of native coronary artery without angina pectoris: Secondary | ICD-10-CM

## 2013-10-18 DIAGNOSIS — E785 Hyperlipidemia, unspecified: Secondary | ICD-10-CM

## 2013-10-18 DIAGNOSIS — I10 Essential (primary) hypertension: Secondary | ICD-10-CM

## 2013-10-18 MED ORDER — ISOSORBIDE MONONITRATE ER 30 MG PO TB24: 30.0000 mg | ORAL_TABLET | Freq: Every day | ORAL | Status: DC

## 2013-10-18 NOTE — Patient Instructions (Addendum)
Your physician has recommended you make the following change in your medication:  1. Start Imdur 30 mg daily  Your physician has requested that you have en exercise stress myoview. For further information please visit [facility]iest[email or web address]. Please follow instruction sheet, as given.  TODAY OR TOMORROW For further information please visit [facility][email or web address]. Please also follow instruction sheet, as given.  Your physician recommends that you have a  lipid profile today   Your physician recommends that you schedule a follow-up appointment in: 1 month with DR. Mclean

## 2013-10-18 NOTE — Progress Notes (Signed)
Patient ID: [name], male   DOB: January 26, 1961, 53 y.o.   MRN: [id number] PCP: Dr. Alain Marion  53 y.o. with history of HTN, obesity, nonobstructive CAD, and smoking presents for evaluation of chest pain.  I saw him back in 2010 for chest pain episodes.  He had a Lexiscan myoview in 12/10 showing EF 46% and possible old inferior infarction. ECG had nonspecific T wave changes but no Qs.  Given the abnormal myoview and ongoing chest pain, I decided to take him for left heart cath in 9/11. Left heart cath showed a 30-40% mid RCA stenosis and a 30% mid circumflex stenosis but no obstructive disease.  He had LHC again in 10/13 with a progressive 60-70% mid RCA stenosis.  It is possible that vasospasm in this area associated with heavy nicotine use could have caused his chest pain.  I planned medical management and started him on metoprolol 25 mg bid.  Since then, he has stopped metoprolol.  He is still smoking.   Patient has been having episodes of chest pain x 2 wks.  He has been under more stress at work and home.  He has been having episodes of chest tightness that come on at random and last for 2-3 minutes.  He has had episodes about every other day.  He has not had exertional chest pain.  On 6/2, he had chest tightness associated with nausea and clamminess while driving in car.  He went to the ER where ECG and cardiac enzymes were unremarkable.  He took NTG with this episode and thinks that it helped.    Labs (1/11): K 4.4, creatinine 0.8, HDL 43, LDL 80, TSH normal, LFTs normal, hgbA1c 6.7%  Labs (12/12): K 4.1, creatinine 0.8 Labs (8/14): LDL 59, HDL 42 Labs (6/15): K 4.1, creatinine 0.8  ECG: NSR normal  Allergies:  No Known Drug Allergies   Past Medical History:  1. Anxiety  2. 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 Lexiscan myoview (12/10) with EF 46%, diffuse hypokinesis, possible old inferior infarction. Echo (8/11): EF  55%, basal inferior hypokinesis, mild LVH, no significant valve abnormalities. Left heart cath (9/11): 30-40% mid RCA, 30% mid CFX, no obstructive disease. Medical management for mild CAD. LHC (10/13) with 60-70% mRCA stenosis, 40-50% ostial D1 stenosis.  3. GERD  4. Cocaine abuse 5. OSA on CPAP  - PSG September 10 2007, AHI 119  6. Obesity  7. Tobacco abuse: 2 ppd 8. Low back pain  9. Hyperlipidemia: untreated.  10. DM II: Borderline, diet-controlled.   Family History:  Grandfather died of MI at 78, Father had CABG in early 82s, uncle with atrial fibrillation, brother with MI and cardiac arrest at 54 (died).  Social History:  Occupation: Leisure centre manager  Lives in [place]  2 kids.  Widowed (wife died from cancer).  Now remarried.  Current Smoker 2 ppd  Alcohol use-yes 12 beers a week  Occasional cocaine, regular marijuana  ROS: All systems reviewed and negative except as per HPI.    Current Outpatient Prescriptions  Medication Sig Dispense Refill  . alprazolam (XANAX) 2 MG tablet TAKE 1 TABLET BY MOUTH 3 TIMES DAILY AS NEEDED FOR ANXIETY.  90 tablet  2  . aspirin EC 81 MG tablet Take 1 tablet (81 mg total) by mouth daily.      [name] atorvastatin (LIPITOR) 20 MG tablet Take 1 tablet (20 mg total) by mouth daily.  90 tablet  3  . losartan (COZAAR) 100 MG tablet Take 1 tablet (100 mg total) by mouth daily.  90 tablet  3  . naproxen (NAPROSYN) 500 MG tablet TAKE 1 TABLET BY MOUTH DAILY AS NEEDED  90 tablet  1  . nitroGLYCERIN (NITROSTAT) 0.4 MG SL tablet Place 1 tablet (0.4 mg total) under the tongue every 5 (five) minutes as needed.  20 tablet  3  . traMADol (ULTRAM) 50 MG tablet Take 1-2 tablets (50-100 mg total) by mouth 2 (two) times daily.  90 tablet  3   Current Facility-Administered Medications  Medication Dose Route Frequency Provider Last Rate Last Dose  . albuterol (PROVENTIL) (2.5 MG/3ML) 0.083% nebulizer solution 2.5 mg  2.5 mg Nebulization Once Aleksei Plotnikov V, MD        BP  142/70  Pulse 85  Wt 136.533 kg (301 lb) General: NAD, obese Neck: No JVD, no thyromegaly or thyroid nodule.  Lungs: Mildly decreased breath sounds bilaterally.  CV: Nondisplaced PMI.  Heart regular S1/S2, no S3/S4, no murmur.  No peripheral edema.  No carotid bruit.  Normal pedal pulses.  Abdomen: Soft, nontender, no hepatosplenomegaly, no distention.  Neurologic: Alert and oriented x 3.  Psych: Normal affect. Extremities: No clubbing or cyanosis.  Assessment/Plan: 1. CAD: Moderate RCA stenosis in 10/13 by cath.  He has been having episodes of atypical chest pain for about 2 weeks now.  ECG is normal.  Evaluation in ER on 6/2 was unremarkable.  - Continue ASA, atorvastatin 20, losartan.   - Add Imdur 30 mg daily.  - I will arrange for ETT-Cardiolite this week. If Cardiolite is abnormal, will plan LHC. If normal, will followup in 1 month to see how symptoms are doing.  2. Smoking: Needs to quit.  I strongly encouraged him to work on stopping.   3. HTN: BP is controlled.  4. Cocaine abuse: He says he has not used in a long time.  5. Hyperlipidemia: Goal LDL < 70. Check lipids today.   Larey Dresser 10/18/2013 4:24 PM

## 2013-10-19 ENCOUNTER — Other Ambulatory Visit: Payer: Self-pay

## 2013-10-19 ENCOUNTER — Ambulatory Visit (HOSPITAL_COMMUNITY)
Admission: RE | Admit: 2013-10-19 | Discharge: 2013-10-19 | Disposition: A | Discharge status: Home / Self Care | Payer: BC Managed Care – PPO | Source: Ambulatory Visit | Attending: Cardiology | Admitting: Cardiology

## 2013-10-19 ENCOUNTER — Other Ambulatory Visit: Payer: Self-pay | Admitting: Cardiology

## 2013-10-19 ENCOUNTER — Telehealth: Payer: Self-pay | Admitting: Cardiology

## 2013-10-19 DIAGNOSIS — I4949 Other premature depolarization: Secondary | ICD-10-CM | POA: Insufficient documentation

## 2013-10-19 DIAGNOSIS — R0989 Other specified symptoms and signs involving the circulatory and respiratory systems: Secondary | ICD-10-CM | POA: Insufficient documentation

## 2013-10-19 DIAGNOSIS — I10 Essential (primary) hypertension: Secondary | ICD-10-CM | POA: Insufficient documentation

## 2013-10-19 DIAGNOSIS — R079 Chest pain, unspecified: Secondary | ICD-10-CM

## 2013-10-19 DIAGNOSIS — R0609 Other forms of dyspnea: Secondary | ICD-10-CM | POA: Insufficient documentation

## 2013-10-19 DIAGNOSIS — I2510 Atherosclerotic heart disease of native coronary artery without angina pectoris: Secondary | ICD-10-CM

## 2013-10-19 LAB — LIPID PANEL
Cholesterol: 126 mg/dL (ref 0–200)
HDL: 36 mg/dL — ABNORMAL LOW (ref >39.00)
LDL Cholesterol: 15 mg/dL (ref 0–99)
NONHDL: 90
Total CHOL/HDL Ratio: 4
Triglycerides: 376 mg/dL — ABNORMAL HIGH (ref 0.0–149.0)
VLDL: 75.2 mg/dL — ABNORMAL HIGH (ref 0.0–40.0)

## 2013-10-19 NOTE — Progress Notes (Signed)
Exercise stress test stopped at stage 2, 5.35 min into study due to dyspnea and fatigue. No chest pain.  + HTN.  occ PVC.

## 2013-10-19 NOTE — Addendum Note (Signed)
Addended by: Burnett Kanaris on: 10/19/2013 02:28 PM   Modules accepted: Orders

## 2013-10-19 NOTE — Telephone Encounter (Signed)
Called pt back he is ok with coming Monday 8:00 am to have a Cardiolite Stress test due to weight limit it is a 2 day process stress/ rest. Nuclear department will schedule the second part when patient comes in

## 2013-10-19 NOTE — Telephone Encounter (Signed)
New message    Patient calling back stating KimAlexis call him today. Regarding a test.

## 2013-10-20 ENCOUNTER — Encounter (HOSPITAL_COMMUNITY): Payer: Self-pay

## 2013-10-21 ENCOUNTER — Encounter (HOSPITAL_COMMUNITY): Payer: Self-pay

## 2013-10-21 ENCOUNTER — Encounter: Payer: Self-pay | Admitting: Internal Medicine

## 2013-10-25 ENCOUNTER — Ambulatory Visit (HOSPITAL_COMMUNITY): Payer: BC Managed Care – PPO | Attending: Cardiology | Admitting: Radiology

## 2013-10-25 VITALS — BP 115/64 | Ht 72.0 in | Wt 298.0 lb

## 2013-10-25 DIAGNOSIS — R079 Chest pain, unspecified: Secondary | ICD-10-CM

## 2013-10-25 DIAGNOSIS — I2510 Atherosclerotic heart disease of native coronary artery without angina pectoris: Secondary | ICD-10-CM | POA: Insufficient documentation

## 2013-10-25 MED ORDER — TECHNETIUM TC 99M SESTAMIBI GENERIC - CARDIOLITE
30.0000 | Freq: Once | INTRAVENOUS | Status: AC | PRN
  Administered 2013-10-25: 30 via INTRAVENOUS

## 2013-10-25 MED ORDER — REGADENOSON 0.4 MG/5ML IV SOLN
0.4000 mg | Freq: Once | INTRAVENOUS | Status: AC
  Administered 2013-10-25: 0.4 mg via INTRAVENOUS
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
## 2013-10-28 ENCOUNTER — Ambulatory Visit (HOSPITAL_COMMUNITY): Payer: BC Managed Care – PPO | Attending: Cardiology

## 2013-10-28 DIAGNOSIS — R0989 Other specified symptoms and signs involving the circulatory and respiratory systems: Secondary | ICD-10-CM

## 2013-10-28 MED ORDER — TECHNETIUM TC 99M SESTAMIBI GENERIC - CARDIOLITE
33.0000 | Freq: Once | INTRAVENOUS | Status: AC | PRN
  Administered 2013-10-28: 33 via INTRAVENOUS

## 2013-11-01 ENCOUNTER — Telehealth: Payer: Self-pay | Admitting: Cardiology

## 2013-11-01 NOTE — Progress Notes (Signed)
LMTCB mobile number, work number I was told pt on vacation this week.

## 2013-11-01 NOTE — Progress Notes (Signed)
Pt states he is still having chest pain occasionally but it is better since starting Imdur. Typically he has chest pain first thing in the morning right when he wakes up. He takes his medication and drinks coffee. His chest pain usually resolves 1-2 hours after waking up and never occurs any other time of day, including with exertion.

## 2013-11-01 NOTE — Telephone Encounter (Signed)
New problem ° ° °Pt returning your call. °

## 2013-11-02 NOTE — Progress Notes (Signed)
Dr Aundra Dubin recommended pt schedule office visit to discuss results and recommendations. Pt aware of appt 11/11/13 with Dr Aundra Dubin.

## 2013-11-02 NOTE — Telephone Encounter (Signed)
Pt aware appt has been made for him with Dr Aundra Dubin 11/11/13 to discuss myoview results

## 2013-11-02 NOTE — Telephone Encounter (Signed)
LMTCB

## 2013-11-09 ENCOUNTER — Telehealth: Payer: Self-pay | Admitting: Cardiology

## 2013-11-09 DIAGNOSIS — I2510 Atherosclerotic heart disease of native coronary artery without angina pectoris: Secondary | ICD-10-CM

## 2013-11-09 NOTE — Telephone Encounter (Signed)
Patient is returning your call please call back

## 2013-11-09 NOTE — Telephone Encounter (Signed)
Pt has an appt to see Dr Aundra Dubin 11/11/13 to discuss myoview results and possible cath.  Pt is asking if he can cancel the appt with Dr Aundra Dubin 11/11/13 and schedule cath over the telephone without coming to office to discuss.  I will forward to Dr Aundra Dubin for review.

## 2013-11-09 NOTE — Telephone Encounter (Signed)
Advised patient

## 2013-11-09 NOTE — Telephone Encounter (Signed)
Tell him I will call him tomorrow.

## 2013-11-09 NOTE — Telephone Encounter (Signed)
New problem    Pt need to speak to you about his appt for Thursday. Pt feel like he doesn't need to come and it can be done over phone. Please call pt.

## 2013-11-10 NOTE — Telephone Encounter (Signed)
Follow Up:  Pt is requesting a call back

## 2013-11-10 NOTE — Telephone Encounter (Signed)
Returned call to patient he stated he has not received call from [place] today.Patient stated hard to get off work,would like cath scheduled without having to come to office for another visit.Message sent to [place].

## 2013-11-10 NOTE — Telephone Encounter (Signed)
Please schedule LHC week of 7/13 for [name]. Monday is the best day for him.  I am in CHF clinic so would be best for cath at 12:45.  Can be any day that week otherwise, please check with Kevan Rosebush regarding clinic schedule to decide where would be the best place to put his case. Please call him tomorrow with a time.

## 2013-11-11 ENCOUNTER — Ambulatory Visit: Payer: Self-pay | Admitting: Cardiology

## 2013-11-11 NOTE — Telephone Encounter (Signed)
LHC scheduled for 11/22/13 12 Noon, pt advised.

## 2013-11-12 ENCOUNTER — Encounter (HOSPITAL_COMMUNITY): Payer: Self-pay | Admitting: Pharmacy Technician

## 2013-11-16 ENCOUNTER — Other Ambulatory Visit (INDEPENDENT_AMBULATORY_CARE_PROVIDER_SITE_OTHER): Payer: BC Managed Care – PPO

## 2013-11-16 DIAGNOSIS — I2510 Atherosclerotic heart disease of native coronary artery without angina pectoris: Secondary | ICD-10-CM

## 2013-11-16 LAB — CBC WITH DIFFERENTIAL/PLATELET
BASOS PCT: 0.4 % (ref 0.0–3.0)
Basophils Absolute: 0 10*3/uL (ref 0.0–0.1)
Eosinophils Absolute: 0.3 10*3/uL (ref 0.0–0.7)
Eosinophils Relative: 2.9 % (ref 0.0–5.0)
HCT: 46.3 % (ref 39.0–52.0)
HEMOGLOBIN: 16.2 g/dL (ref 13.0–17.0)
LYMPHS PCT: 32.4 % (ref 12.0–46.0)
Lymphs Abs: 2.8 10*3/uL (ref 0.7–4.0)
MCHC: 35 g/dL (ref 30.0–36.0)
MCV: 90.9 fl (ref 78.0–100.0)
Monocytes Absolute: 0.8 10*3/uL (ref 0.1–1.0)
Monocytes Relative: 9.3 % (ref 3.0–12.0)
NEUTROS ABS: 4.8 10*3/uL (ref 1.4–7.7)
Neutrophils Relative %: 55 % (ref 43.0–77.0)
Platelets: 157 10*3/uL (ref 150.0–400.0)
RBC: 5.09 Mil/uL (ref 4.22–5.81)
RDW: 13.4 % (ref 11.5–15.5)
WBC: 8.7 10*3/uL (ref 4.0–10.5)

## 2013-11-16 LAB — BASIC METABOLIC PANEL
BUN: 16 mg/dL (ref 6–23)
CO2: 27 meq/L (ref 19–32)
Calcium: 9.8 mg/dL (ref 8.4–10.5)
Chloride: 107 mEq/L (ref 96–112)
Creatinine, Ser: 0.8 mg/dL (ref 0.4–1.5)
GFR: 114.67 mL/min (ref >60.00)
Glucose, Bld: 92 mg/dL (ref 70–99)
Potassium: 4.1 mEq/L (ref 3.5–5.1)
SODIUM: 140 meq/L (ref 135–145)

## 2013-11-16 LAB — PROTIME-INR
INR: 0.9 ratio (ref 0.8–1.0)
Prothrombin Time: 9.9 s (ref 9.6–13.1)

## 2013-11-22 ENCOUNTER — Encounter (HOSPITAL_COMMUNITY)
Admission: RE | Disposition: A | Discharge status: Home / Self Care | Payer: Self-pay | Source: Ambulatory Visit | Attending: Cardiology

## 2013-11-22 ENCOUNTER — Ambulatory Visit (HOSPITAL_COMMUNITY)
Admission: RE | Admit: 2013-11-22 | Discharge: 2013-11-22 | Disposition: A | Discharge status: Home / Self Care | Payer: BC Managed Care – PPO | Source: Ambulatory Visit | Attending: Cardiology | Admitting: Cardiology

## 2013-11-22 DIAGNOSIS — F17200 Nicotine dependence, unspecified, uncomplicated: Secondary | ICD-10-CM | POA: Insufficient documentation

## 2013-11-22 DIAGNOSIS — E785 Hyperlipidemia, unspecified: Secondary | ICD-10-CM | POA: Insufficient documentation

## 2013-11-22 DIAGNOSIS — F411 Generalized anxiety disorder: Secondary | ICD-10-CM | POA: Insufficient documentation

## 2013-11-22 DIAGNOSIS — I209 Angina pectoris, unspecified: Secondary | ICD-10-CM | POA: Insufficient documentation

## 2013-11-22 DIAGNOSIS — I2510 Atherosclerotic heart disease of native coronary artery without angina pectoris: Secondary | ICD-10-CM

## 2013-11-22 DIAGNOSIS — F1411 Cocaine abuse, in remission: Secondary | ICD-10-CM | POA: Insufficient documentation

## 2013-11-22 DIAGNOSIS — G4733 Obstructive sleep apnea (adult) (pediatric): Secondary | ICD-10-CM | POA: Insufficient documentation

## 2013-11-22 DIAGNOSIS — K219 Gastro-esophageal reflux disease without esophagitis: Secondary | ICD-10-CM | POA: Insufficient documentation

## 2013-11-22 DIAGNOSIS — I252 Old myocardial infarction: Secondary | ICD-10-CM | POA: Insufficient documentation

## 2013-11-22 DIAGNOSIS — Z7982 Long term (current) use of aspirin: Secondary | ICD-10-CM | POA: Insufficient documentation

## 2013-11-22 DIAGNOSIS — R7309 Other abnormal glucose: Secondary | ICD-10-CM | POA: Insufficient documentation

## 2013-11-22 DIAGNOSIS — E669 Obesity, unspecified: Secondary | ICD-10-CM | POA: Insufficient documentation

## 2013-11-22 HISTORY — PX: LEFT HEART CATHETERIZATION WITH CORONARY ANGIOGRAM: SHX5451

## 2013-11-22 SURGERY — LEFT HEART CATHETERIZATION WITH CORONARY ANGIOGRAM
Anesthesia: LOCAL

## 2013-11-22 MED ORDER — VERAPAMIL HCL 2.5 MG/ML IV SOLN
INTRAVENOUS | Status: AC
  Filled 2013-11-22: qty 2

## 2013-11-22 MED ORDER — ACETAMINOPHEN 325 MG PO TABS: 650.0000 mg | ORAL_TABLET | ORAL | Status: DC | PRN

## 2013-11-22 MED ORDER — SODIUM CHLORIDE 0.9 % IV SOLN: INTRAVENOUS | Status: DC

## 2013-11-22 MED ORDER — HEPARIN SODIUM (PORCINE) 1000 UNIT/ML IJ SOLN
INTRAMUSCULAR | Status: AC
  Filled 2013-11-22: qty 1

## 2013-11-22 MED ORDER — SODIUM CHLORIDE 0.9 % IV SOLN
INTRAVENOUS | Status: DC
  Administered 2013-11-22: 11:00:00 via INTRAVENOUS

## 2013-11-22 MED ORDER — MIDAZOLAM HCL 2 MG/2ML IJ SOLN
INTRAMUSCULAR | Status: AC
  Filled 2013-11-22: qty 2

## 2013-11-22 MED ORDER — ASPIRIN 81 MG PO CHEW: 81.0000 mg | CHEWABLE_TABLET | ORAL | Status: DC

## 2013-11-22 MED ORDER — FENTANYL CITRATE 0.05 MG/ML IJ SOLN: 50.0000 ug | Freq: Once | INTRAMUSCULAR | Status: DC

## 2013-11-22 MED ORDER — SODIUM CHLORIDE 0.9 % IJ SOLN: 3.0000 mL | Freq: Two times a day (BID) | INTRAMUSCULAR | Status: DC

## 2013-11-22 MED ORDER — LIDOCAINE HCL (PF) 1 % IJ SOLN
INTRAMUSCULAR | Status: AC
  Filled 2013-11-22: qty 30

## 2013-11-22 MED ORDER — SODIUM CHLORIDE 0.9 % IJ SOLN: 3.0000 mL | INTRAMUSCULAR | Status: DC | PRN

## 2013-11-22 MED ORDER — FENTANYL CITRATE 0.05 MG/ML IJ SOLN
INTRAMUSCULAR | Status: AC
  Administered 2013-11-22: 50 ug
  Filled 2013-11-22: qty 2

## 2013-11-22 MED ORDER — SODIUM CHLORIDE 0.9 % IV SOLN: 250.0000 mL | INTRAVENOUS | Status: DC | PRN

## 2013-11-22 MED ORDER — HEPARIN (PORCINE) IN NACL 2-0.9 UNIT/ML-% IJ SOLN
INTRAMUSCULAR | Status: AC
  Filled 2013-11-22: qty 1000

## 2013-11-22 MED ORDER — NITROGLYCERIN 0.2 MG/ML ON CALL CATH LAB
INTRAVENOUS | Status: AC
  Filled 2013-11-22: qty 1

## 2013-11-22 MED ORDER — FENTANYL CITRATE 0.05 MG/ML IJ SOLN
INTRAMUSCULAR | Status: AC
  Filled 2013-11-22: qty 2

## 2013-11-22 MED ORDER — ONDANSETRON HCL 4 MG/2ML IJ SOLN: 4.0000 mg | Freq: Four times a day (QID) | INTRAMUSCULAR | Status: DC | PRN

## 2013-11-22 NOTE — CV Procedure (Signed)
    Cardiac Catheterization Procedure Note  Name: [name] MRN: [id number] DOB: 1960/11/06  Procedure: Left Heart Cath, Selective Coronary Angiography, LV angiography  Indication: Angina, history of CAD, abnormal Cardiolite.    Procedural Details: The right wrist was prepped, draped, and anesthetized with 1% lidocaine. Using the modified Seldinger technique, a 5 French sheath was introduced into the right radial artery. 3 mg of verapamil was administered through the sheath, weight-based unfractionated heparin was administered intravenously. Standard Judkins catheters were used for selective coronary angiography and left ventriculography. Catheter exchanges were performed over an exchange length guidewire. There were no immediate procedural complications. A TR band was used for radial hemostasis at the completion of the procedure.  The patient was transferred to the post catheterization recovery area for further monitoring.  Procedural Findings: Hemodynamics: AO 95/64 LV 99/8  Coronary angiography: Coronary dominance: right  Left mainstem: No significant disease.   Left anterior descending (LAD): Luminal irregularities throughout LAD with narrowing of distal branch vessels.  50% ostial stenosis of moderate D1.   Left circumflex (LCx): 40% proximal LCx stenosis at take-off of moderate OM1 and moderate OM2 (ostial are close together). 50% ostial OM2 stenosis.  Narrowing of distal branch vessels.   Right coronary artery (RCA): 40% proximal RCA stenosis, luminal irregularities throughout the remainder of the RCA system.   Left ventriculography: Left ventricular systolic function is normal, LVEF is estimated at 55-60%, there is no significant mitral regurgitation, no regional wall motion abnormalities in RAO projection.   Final Conclusions:  Nonobstructive disease in the major coronaries.  The RCA actually looks better than on prior angiography.  There is distal and branch  vessel disease diffusely, this could be the source of his anginal symptoms.  As Imdur has helped some, I will increase the Imdur to 60 mg daily.   Loralie Champagne 11/22/2013, 12:28 PM

## 2013-11-22 NOTE — Discharge Instructions (Signed)
Radial Site Care °Refer to this sheet in the next few weeks. These instructions provide you with information on caring for yourself after your procedure. Your caregiver may also give you more specific instructions. Your treatment has been planned according to current medical practices, but problems sometimes occur. Call your caregiver if you have any problems or questions after your procedure. °HOME CARE INSTRUCTIONS °· You may shower the day after the procedure. Remove the bandage (dressing) and gently wash the site with plain soap and water. Gently pat the site dry. °· Do not apply powder or lotion to the site. °· Do not submerge the affected site in water for 3 to 5 days. °· Inspect the site at least twice daily. °· Do not flex or bend the affected arm for 24 hours. °· No lifting over 5 pounds (2.3 kg) for 5 days after your procedure. °· Do not drive home if you are discharged the same day of the procedure. Have someone else drive you. °· You may drive 24 hours after the procedure unless otherwise instructed by your caregiver. °· Do not operate machinery or power tools for 24 hours. °· A responsible adult should be with you for the first 24 hours after you arrive home. °What to expect: °· Any bruising will usually fade within 1 to 2 weeks. °· Blood that collects in the tissue (hematoma) may be painful to the touch. It should usually decrease in size and tenderness within 1 to 2 weeks. °SEEK IMMEDIATE MEDICAL CARE IF: °· You have unusual pain at the radial site. °· You have redness, warmth, swelling, or pain at the radial site. °· You have drainage (other than a small amount of blood on the dressing). °· You have chills. °· You have a fever or persistent symptoms for more than 72 hours. °· You have a fever and your symptoms suddenly get worse. °· Your arm becomes pale, cool, tingly, or numb. °· You have heavy bleeding from the site. Hold pressure on the site. °Document Released: 06/01/2010 Document Revised:  07/22/2011 Document Reviewed: 06/01/2010 °ExitCare® Patient Information ©2015 [employer]. This information is not intended to replace advice given to you by your health care provider. Make sure you discuss any questions you have with your health care provider. ° °

## 2013-11-22 NOTE — H&P (Signed)
Physician History and Physical    [name] MRN: [id number] DOB/AGE: 1960-08-05 53 y.o. Admit date: 11/22/2013  Primary Cardiologist: Aundra Dubin  HPI: Patient has history of moderate RCA stenosis.  He developed chest pain symptoms recently, some improvement with Imdur but persistent symptoms.  Abnormal Cardiolite but overall low risk.  Given ongoing symptoms, set up for LHC.   Review of systems complete and found to be negative unless listed above   Past Medical History  Diagnosis Date  . Anxiety   . GERD (gastroesophageal reflux disease)   . Hyperlipidemia   . Diabetes mellitus      II, borderline, diet controlled  . 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 CAD (coronary artery disease)   . OSA (obstructive sleep apnea)     cpap -PSG apr30 2009, AHI  119  . Pain, low back   . Tobacco use disorder   . Cocaine abuse     none now  . Obesity, unspecified      Family History  Problem Relation Age of Onset  . Heart attack Father 15    grandfather died MI 38  . Heart disease Father     cad  . Heart attack    . Atrial fibrillation Other   . Cancer Mother     lung ca    History   Social History  . Marital Status: Widowed    Spouse Name: N/A    Number of Children: 2  . Years of Education: N/A   Occupational History  . car sales    Social History Main Topics  . Smoking status: Current Every Day Smoker -- 1.50 packs/day for 25 years    Types: Cigarettes  . Smokeless tobacco: Never Used  . Alcohol Use: 6.0 oz/week    12 drink(s) per week  . Drug Use: Yes    Special: Marijuana     Comment: cocaine abuse yrs ago. none now.  . Sexual Activity: Not on file   Other Topics Concern  . Not on file   Social History Narrative  . No narrative on file     Facility-administered medications prior to admission  Medication Dose Route Frequency Provider Last Rate Last Dose  . albuterol (PROVENTIL) (2.5 MG/3ML) 0.083% nebulizer  solution 2.5 mg  2.5 mg Nebulization Once Lew Dawes V, MD       Prescriptions prior to admission  Medication Sig Dispense Refill  . alprazolam (XANAX) 2 MG tablet Take 1 mg by mouth daily as needed for sleep.      [name] aspirin EC 81 MG tablet Take 1 tablet (81 mg total) by mouth daily.      [name] atorvastatin (LIPITOR) 20 MG tablet Take 1 tablet (20 mg total) by mouth daily.  90 tablet  3  . isosorbide mononitrate (IMDUR) 30 MG 24 hr tablet Take 1 tablet (30 mg total) by mouth daily.  90 tablet  3  . losartan (COZAAR) 100 MG tablet Take 1 tablet (100 mg total) by mouth daily.  90 tablet  3  . nitroGLYCERIN (NITROSTAT) 0.4 MG SL tablet Place 1 tablet (0.4 mg total) under the tongue every 5 (five) minutes as needed.  20 tablet  3  . traMADol (ULTRAM) 50 MG tablet Take 100 mg by mouth every morning.      . naproxen (NAPROSYN) 500 MG tablet Take 500 mg by mouth daily.        Physical Exam: Blood pressure  132/81, pulse 90, temperature 97.8 F (36.6 C), temperature source Oral, resp. rate 20, height 6' (1.829 m), weight 136.079 kg (300 lb), SpO2 96.00%.  General: NAD, obese Neck: Thick, no JVD, no thyromegaly or thyroid nodule.  Lungs: Clear to auscultation bilaterally with normal respiratory effort. CV: Nondisplaced PMI.  Heart regular S1/S2, no S3/S4, no murmur.  No peripheral edema.  No carotid bruit.  Normal pedal pulses.  Abdomen: Soft, nontender, no hepatosplenomegaly, no distention.  Skin: Intact without lesions or rashes.  Neurologic: Alert and oriented x 3.  Psych: Normal affect. Extremities: No clubbing or cyanosis.  HEENT: Normal.   Labs:   Lab Results  Component Value Date   WBC 8.7 11/16/2013   HGB 16.2 11/16/2013   HCT 46.3 11/16/2013   MCV 90.9 11/16/2013   PLT 157.0 11/16/2013    Recent Labs Lab 11/16/13 0817  NA 140  K 4.1  CL 107  CO2 27  BUN 16  CREATININE 0.8  CALCIUM 9.8  GLUCOSE 92   Lab Results  Component Value Date   CKTOTAL 339* 05/07/2011   CKMB 3.2  05/07/2011   TROPONINI <0.30 10/12/2013    Lab Results  Component Value Date   CHOL 126 10/18/2013   CHOL 131 12/22/2012   CHOL 123 05/28/2012   Lab Results  Component Value Date   HDL 36.00* 10/18/2013   HDL 41.80 12/22/2012   HDL 35.30* 05/28/2012   Lab Results  Component Value Date   LDLCALC 15 10/18/2013   LDLCALC 59 12/22/2012   LDLCALC 109* 02/06/2012   Lab Results  Component Value Date   TRIG 376.0* 10/18/2013   TRIG 149.0 12/22/2012   TRIG 293.0* 05/28/2012   Lab Results  Component Value Date   CHOLHDL 4 10/18/2013   CHOLHDL 3 12/22/2012   CHOLHDL 3 05/28/2012   Lab Results  Component Value Date   LDLDIRECT 48.0 05/28/2012   LDLDIRECT 102.4 03/23/2010   LDLDIRECT 79.6 06/12/2009    ASSESSMENT AND PLAN:  As above, plan diagnostic catheterization today.   Signed: Loralie Champagne 11/22/2013, 11:44 AM

## 2013-11-24 ENCOUNTER — Telehealth: Payer: Self-pay | Admitting: Internal Medicine

## 2013-11-24 NOTE — Telephone Encounter (Signed)
Needs OV Thx 

## 2013-11-24 NOTE — Telephone Encounter (Signed)
Pt request referral to neurology for his back pain and urology for testicle problem. Please advise.

## 2013-11-24 NOTE — Telephone Encounter (Signed)
appt set for 7:45 tomorrow.

## 2013-11-24 NOTE — Telephone Encounter (Signed)
Pt request to be work in for early tomorrow morning, due the amount of pain he is in. Please advise, no appt tomorrow.

## 2013-11-25 ENCOUNTER — Ambulatory Visit (INDEPENDENT_AMBULATORY_CARE_PROVIDER_SITE_OTHER): Payer: BC Managed Care – PPO | Admitting: Internal Medicine

## 2013-11-25 ENCOUNTER — Encounter: Payer: Self-pay | Admitting: Internal Medicine

## 2013-11-25 VITALS — BP 110/72 | HR 76 | Temp 98.0°F | Resp 16 | Wt 307.0 lb

## 2013-11-25 DIAGNOSIS — F411 Generalized anxiety disorder: Secondary | ICD-10-CM

## 2013-11-25 DIAGNOSIS — M545 Low back pain, unspecified: Secondary | ICD-10-CM | POA: Insufficient documentation

## 2013-11-25 DIAGNOSIS — I2510 Atherosclerotic heart disease of native coronary artery without angina pectoris: Secondary | ICD-10-CM

## 2013-11-25 DIAGNOSIS — M79605 Pain in left leg: Principal | ICD-10-CM

## 2013-11-25 MED ORDER — PREDNISONE 10 MG PO TABS: ORAL_TABLET | ORAL | Status: DC

## 2013-11-25 MED ORDER — HYDROCODONE-ACETAMINOPHEN 10-325 MG PO TABS: 1.0000 | ORAL_TABLET | Freq: Three times a day (TID) | ORAL | Status: DC | PRN

## 2013-11-25 NOTE — Assessment & Plan Note (Addendum)
MRI LS Prednisone 10 mg: take 4 tabs a day x 3 days; then 3 tabs a day x 4 days; then 2 tabs a day x 4 days, then 1 tab a day x 6 days, then stop. Take pc. RTC 2 weeks  Potential benefits of a short term steroid  use as well as potential risks  and complications were explained to the patient and were aknowledged.

## 2013-11-25 NOTE — Patient Instructions (Signed)
Stretch  

## 2013-11-25 NOTE — Assessment & Plan Note (Signed)
Continue with current prescription therapy as reflected on the Med list.  

## 2013-11-25 NOTE — Assessment & Plan Note (Signed)
Chronic. No angina left heart cath in 9/11: Left heart cath showed a 30-40% mid RCA stenosis and a 30% mid circumflex stenosis but no obstructive disease.  11/22/13:  Coronary angiography:  Coronary dominance: right  Left mainstem: No significant disease.  Left anterior descending (LAD): Luminal irregularities throughout LAD with narrowing of distal branch vessels. 50% ostial stenosis of moderate D1.  Left circumflex (LCx): 40% proximal LCx stenosis at take-off of moderate OM1 and moderate OM2 (ostial are close together). 50% ostial OM2 stenosis. Narrowing of distal branch vessels.  Right coronary artery (RCA): 40% proximal RCA stenosis, luminal irregularities throughout the remainder of the RCA system.  Left ventriculography: Left ventricular systolic function is normal, LVEF is estimated at 55-60%, there is no significant mitral regurgitation, no regional wall motion abnormalities in RAO projection.  Final Conclusions: Nonobstructive disease in the major coronaries. The RCA actually looks better than on prior angiography. There is distal and branch vessel disease diffusely, this could be the source of his anginal symptoms. As Imdur has helped some, I will increase the Imdur to 60 mg daily.
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
## 2013-11-25 NOTE — Progress Notes (Signed)
Pre visit review using our clinic review tool, if applicable. No additional management support is needed unless otherwise documented below in the visit note. 

## 2013-11-26 ENCOUNTER — Telehealth: Payer: Self-pay | Admitting: Internal Medicine

## 2013-11-26 NOTE — Telephone Encounter (Signed)
Relevant patient education mailed to patient.  

## 2013-11-29 ENCOUNTER — Telehealth: Payer: Self-pay | Admitting: *Deleted

## 2013-11-29 NOTE — Telephone Encounter (Signed)
Message copied by Katrine Coho on Mon Nov 29, 2013  1:34 PM ------      Message from: Pauline Good      Created: Wed Nov 24, 2013  2:03 PM       Webb Silversmith, I called pt to sched and he stated he was going to pass on appt and hung up phone on me.                  Thanks      [place]      ----- Message -----         From: Katrine Coho, RN         Sent: 11/22/2013   1:41 PM           To: Pauline Good            Please call pt to schedule a post cath appt with Nicki Reaper in a couple of weeks per Dr Aundra Dubin. Thanks.        ------

## 2013-11-30 ENCOUNTER — Ambulatory Visit: Payer: Self-pay | Admitting: Cardiology

## 2013-12-06 ENCOUNTER — Telehealth: Payer: Self-pay | Admitting: *Deleted

## 2013-12-06 ENCOUNTER — Inpatient Hospital Stay: Admission: RE | Admit: 2013-12-06 | Payer: Self-pay | Source: Ambulatory Visit

## 2013-12-06 NOTE — Telephone Encounter (Signed)
Left msg on triage have a head/chest cold. Requesting md to call in z-pack. Called pt back inform him md is on vacation this week & before any other md can rx anything he must come in for appt. Pt stated ok... He will call one of his other physician to rx something...[name]

## 2013-12-12 ENCOUNTER — Ambulatory Visit
Admission: RE | Admit: 2013-12-12 | Discharge: 2013-12-12 | Disposition: A | Discharge status: Home / Self Care | Payer: BC Managed Care – PPO | Source: Ambulatory Visit | Attending: Internal Medicine | Admitting: Internal Medicine

## 2013-12-12 DIAGNOSIS — M545 Low back pain, unspecified: Secondary | ICD-10-CM

## 2013-12-12 DIAGNOSIS — M79605 Pain in left leg: Principal | ICD-10-CM

## 2013-12-16 ENCOUNTER — Ambulatory Visit (INDEPENDENT_AMBULATORY_CARE_PROVIDER_SITE_OTHER): Payer: BC Managed Care – PPO | Admitting: Internal Medicine

## 2013-12-16 ENCOUNTER — Encounter: Payer: Self-pay | Admitting: Internal Medicine

## 2013-12-16 VITALS — BP 130/68 | HR 76 | Temp 97.4°F | Resp 16 | Wt 303.0 lb

## 2013-12-16 DIAGNOSIS — F17200 Nicotine dependence, unspecified, uncomplicated: Secondary | ICD-10-CM

## 2013-12-16 DIAGNOSIS — I2510 Atherosclerotic heart disease of native coronary artery without angina pectoris: Secondary | ICD-10-CM

## 2013-12-16 DIAGNOSIS — M5416 Radiculopathy, lumbar region: Secondary | ICD-10-CM

## 2013-12-16 DIAGNOSIS — M79605 Pain in left leg: Secondary | ICD-10-CM

## 2013-12-16 DIAGNOSIS — IMO0002 Reserved for concepts with insufficient information to code with codable children: Secondary | ICD-10-CM

## 2013-12-16 DIAGNOSIS — M545 Low back pain, unspecified: Secondary | ICD-10-CM

## 2013-12-16 MED ORDER — HYDROCODONE-ACETAMINOPHEN 10-325 MG PO TABS: 1.0000 | ORAL_TABLET | Freq: Three times a day (TID) | ORAL | Status: DC | PRN

## 2013-12-16 NOTE — Assessment & Plan Note (Signed)
Continue with current prescription therapy as reflected on the Med list.  

## 2013-12-16 NOTE — Assessment & Plan Note (Signed)
12/12/13 LS MRI FINDINGS:  Normal signal is present in the conus medullaris which terminates at  T12-L1. Marrow signal and vertebral body heights are normal. There  is chronic loss of disc height at L4-5 and L5-S1.  Limited imaging the abdomen is unremarkable.  L1-2: Negative.  L2-3: A broad-based disc protrusion is present. Short pedicles are  evident. There is no significant focal stenosis.  L3-4: Mild broad-based disc protrusion and short pedicles results  and mild to moderate foraminal stenosis bilaterally, worse on the  left.  L4-5: A broad-based disc protrusion is more prominent on the right.  Short pedicles and mild facet hypertrophy are noted. Moderate right  foraminal stenosis is present. Mild subarticular narrowing is worse  on the left. Mild left foraminal stenosis is present.  L5-S1: A rightward disc protrusion is present. Moderate facet  hypertrophy is evident. This results in moderate right subarticular  stenosis and right greater than left foraminal narrowing.  IMPRESSION:  1. 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  2. Mild to moderate foraminal stenosis bilaterally at L3-4 is worse  on the left.  3. Moderate right and mild left foraminal stenosis at L4-5.  4. Mild subarticular stenosis at L4-5 is worse on the left.  5. Moderate right subarticular stenosis at L5-S1 with mild to  moderate foraminal narrowing, right greater than left.

## 2013-12-16 NOTE — Assessment & Plan Note (Signed)
Potential benefits of a short or long term opioids use as well as potential risks (i.e. addiction risk, apnea etc) and complications (i.e. Somnolence, constipation and others) were explained to the patient and were aknowledged. See Rx

## 2013-12-16 NOTE — Progress Notes (Signed)
Pre visit review using our clinic review tool, if applicable. No additional management support is needed unless otherwise documented below in the visit note. 

## 2013-12-16 NOTE — Assessment & Plan Note (Signed)
8/14 8/15 2 ppd

## 2013-12-16 NOTE — Progress Notes (Signed)
Subjective:     Back Pain This is a chronic problem. The current episode started more than 1 year ago (worse x1 mo after boating). The problem has been gradually worsening since onset. The pain is present in the gluteal, lumbar spine and sacro-iliac. The quality of the pain is described as stabbing, shooting and burning. The pain radiates to the left thigh (L testes). The pain is at a severity of 9/10. The pain is severe. The symptoms are aggravated by twisting. Pertinent negatives include no chest pain or weakness. Risk factors include obesity and sedentary lifestyle. He has tried analgesics and NSAIDs for the symptoms. The treatment provided mild relief.  F/u L testicle pain - better LLE is weak, LLE pain irradiation Better w/Prednisone He had a cold, took a Z pac   [name] got re-married in 8/14  C/o ST and L earache x 1-2 wks - new  F/u some CPs, SOB - smoking 1-2 ppd, gained wt The patient is here to follow up on chronic HTN, anxiety, elev glu and chronic moderate LBP symptoms controlled with medicines. His brother died of a MI at 49. F/u memory issues - some better Not loosing wt lately.   Review of Systems  Constitutional: Negative for appetite change, fatigue and unexpected weight change.  HENT: Negative for congestion, nosebleeds, sneezing, sore throat and trouble swallowing.   Eyes: Negative for itching and visual disturbance.  Respiratory: Negative for cough.   Cardiovascular: Negative for chest pain, palpitations and leg swelling.  Gastrointestinal: Negative for nausea, diarrhea, blood in stool and abdominal distention.  Genitourinary: Negative for frequency and hematuria.  Musculoskeletal: Negative for back pain, gait problem, joint swelling and neck pain.  Skin: Negative for rash.  Neurological: Negative for dizziness, tremors, speech difficulty and weakness.  Psychiatric/Behavioral: Negative for suicidal ideas, sleep disturbance, dysphoric mood and agitation. The  patient is not nervous/anxious.    Wt Readings from Last 3 Encounters:  12/16/13 303 lb (137.44 kg)  11/25/13 307 lb (139.254 kg)  11/22/13 300 lb (136.079 kg)   BP Readings from Last 3 Encounters:  12/16/13 130/68  11/25/13 110/72  11/22/13 120/69       Objective:   Physical Exam  Constitutional: He is oriented to person, place, and time. He appears well-developed. No distress.  obese  HENT:  Mouth/Throat: Oropharynx is clear and moist.  Eyes: Conjunctivae are normal. Pupils are equal, round, and reactive to light.  Neck: Normal range of motion. No JVD present. No thyromegaly present.  Cardiovascular: Normal rate, regular rhythm, normal heart sounds and intact distal pulses.  Exam reveals no gallop and no friction rub.   No murmur heard. Pulmonary/Chest: Effort normal and breath sounds normal. No respiratory distress. He has no wheezes. He has no rales. He exhibits no tenderness.  Abdominal: Soft. Bowel sounds are normal. He exhibits no distension and no mass. There is no tenderness. There is no rebound and no guarding.  Musculoskeletal: Normal range of motion. He exhibits no edema and no tenderness.  Lymphadenopathy:    He has no cervical adenopathy.  Neurological: He is alert and oriented to person, place, and time. He has normal reflexes. No cranial nerve deficit. He exhibits normal muscle tone. Coordination normal.  Skin: Skin is warm and dry. No rash noted.  Psychiatric: He has a normal mood and affect. His behavior is normal. Judgment and thought content normal.  LS is tender L testis is not tender Strait leg elev is (+) L   Lab Results  Component Value Date   WBC 8.7 11/16/2013   HGB 16.2 11/16/2013   HCT 46.3 11/16/2013   PLT 157.0 11/16/2013   GLUCOSE 92 11/16/2013   CHOL 126 10/18/2013   TRIG 376.0* 10/18/2013   HDL 36.00* 10/18/2013   LDLDIRECT 48.0 05/28/2012   LDLCALC 15 10/18/2013   ALT 38 12/22/2012   AST 25 12/22/2012   NA 140 11/16/2013   K 4.1 11/16/2013   CL 107  11/16/2013   CREATININE 0.8 11/16/2013   BUN 16 11/16/2013   CO2 27 11/16/2013   TSH 2.08 05/28/2012   PSA 0.36 05/28/2012   INR 0.9 11/16/2013   HGBA1C 5.6 03/08/2011    12/12/13 LS MRI FINDINGS:  Normal signal is present in the conus medullaris which terminates at  T12-L1. Marrow signal and vertebral body heights are normal. There  is chronic loss of disc height at L4-5 and L5-S1.  Limited imaging the abdomen is unremarkable.  L1-2: Negative.  L2-3: A broad-based disc protrusion is present. Short pedicles are  evident. There is no significant focal stenosis.  L3-4: Mild broad-based disc protrusion and short pedicles results  and mild to moderate foraminal stenosis bilaterally, worse on the  left.  L4-5: A broad-based disc protrusion is more prominent on the right.  Short pedicles and mild facet hypertrophy are noted. Moderate right  foraminal stenosis is present. Mild subarticular narrowing is worse  on the left. Mild left foraminal stenosis is present.  L5-S1: A rightward disc protrusion is present. Moderate facet  hypertrophy is evident. This results in moderate right subarticular  stenosis and right greater than left foraminal narrowing.  IMPRESSION:  1. 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  2. Mild to moderate foraminal stenosis bilaterally at L3-4 is worse  on the left.  3. Moderate right and mild left foraminal stenosis at L4-5.  4. Mild subarticular stenosis at L4-5 is worse on the left.  5. Moderate right subarticular stenosis at L5-S1 with mild to  moderate foraminal narrowing, right greater than left.       Assessment & Plan:

## 2013-12-22 ENCOUNTER — Ambulatory Visit: Payer: BC Managed Care – PPO | Admitting: Physical Therapy

## 2014-02-01 ENCOUNTER — Other Ambulatory Visit: Payer: Self-pay | Admitting: Internal Medicine

## 2014-02-04 NOTE — Telephone Encounter (Signed)
Notified pharmacy med has already been filled on 02/01/14 & pick up. Duplicate request.../lmb

## 2014-03-07 ENCOUNTER — Other Ambulatory Visit: Payer: Self-pay | Admitting: Internal Medicine

## 2014-03-11 ENCOUNTER — Ambulatory Visit (INDEPENDENT_AMBULATORY_CARE_PROVIDER_SITE_OTHER): Payer: BC Managed Care – PPO | Admitting: Internal Medicine

## 2014-03-11 ENCOUNTER — Encounter: Payer: Self-pay | Admitting: Internal Medicine

## 2014-03-11 VITALS — BP 142/70 | HR 107 | Temp 98.1°F | Ht 72.0 in | Wt 312.0 lb

## 2014-03-11 DIAGNOSIS — I2583 Coronary atherosclerosis due to lipid rich plaque: Secondary | ICD-10-CM

## 2014-03-11 DIAGNOSIS — Z23 Encounter for immunization: Secondary | ICD-10-CM

## 2014-03-11 DIAGNOSIS — I2510 Atherosclerotic heart disease of native coronary artery without angina pectoris: Secondary | ICD-10-CM

## 2014-03-11 MED ORDER — ISOSORBIDE MONONITRATE ER 30 MG PO TB24: 30.0000 mg | ORAL_TABLET | Freq: Every day | ORAL | Status: DC

## 2014-03-11 MED ORDER — ATORVASTATIN CALCIUM 20 MG PO TABS: 20.0000 mg | ORAL_TABLET | Freq: Every day | ORAL | Status: DC

## 2014-03-11 MED ORDER — TRAMADOL HCL 50 MG PO TABS: 50.0000 mg | ORAL_TABLET | ORAL | Status: DC

## 2014-03-11 MED ORDER — LOSARTAN POTASSIUM 100 MG PO TABS: 100.0000 mg | ORAL_TABLET | Freq: Every day | ORAL | Status: DC

## 2014-03-11 MED ORDER — ALPRAZOLAM 2 MG PO TABS: ORAL_TABLET | ORAL | Status: DC

## 2014-03-11 MED ORDER — NITROGLYCERIN 0.4 MG SL SUBL: 0.4000 mg | SUBLINGUAL_TABLET | SUBLINGUAL | Status: DC | PRN

## 2014-03-11 NOTE — Progress Notes (Signed)
Subjective:     Back Pain This is a chronic problem. The current episode started more than 1 year ago (worse x1 mo after boating). The problem has been gradually improving since onset. The pain is present in the gluteal, lumbar spine and sacro-iliac. The quality of the pain is described as stabbing, shooting and burning. The pain radiates to the left thigh (L testes). The pain is at a severity of 5/10. The pain is moderate. The symptoms are aggravated by twisting. Pertinent negatives include no chest pain or weakness. Risk factors include obesity and sedentary lifestyle. He has tried analgesics and NSAIDs for the symptoms. The treatment provided mild relief.   F/u L testicle pain - better LLE is weak, LLE pain irradiation Better w/Prednisone He had a cold, took a Z pac   Quinzell got re-married in 8/14   F/u some CPs, SOB - smoking 1-2 ppd, gained wt The patient is here to follow up on chronic HTN, anxiety, elev glu and chronic moderate LBP symptoms controlled with medicines. His brother died of a MI at 43. F/u memory issues - some better Not loosing wt lately.   Review of Systems  Constitutional: Negative for appetite change, fatigue and unexpected weight change.  HENT: Negative for congestion, nosebleeds, sneezing, sore throat and trouble swallowing.   Eyes: Negative for itching and visual disturbance.  Respiratory: Negative for cough.   Cardiovascular: Negative for chest pain, palpitations and leg swelling.  Gastrointestinal: Negative for nausea, diarrhea, blood in stool and abdominal distention.  Genitourinary: Negative for frequency and hematuria.  Musculoskeletal: Negative for back pain, gait problem, joint swelling and neck pain.  Skin: Negative for rash.  Neurological: Negative for dizziness, tremors, speech difficulty and weakness.  Psychiatric/Behavioral: Negative for suicidal ideas, sleep disturbance, dysphoric mood and agitation. The patient is not nervous/anxious.     Wt Readings from Last 3 Encounters:  03/11/14 312 lb (141.522 kg)  12/16/13 303 lb (137.44 kg)  11/25/13 307 lb (139.254 kg)   BP Readings from Last 3 Encounters:  03/11/14 142/70  12/16/13 130/68  11/25/13 110/72       Objective:   Physical Exam  Constitutional: He is oriented to person, place, and time. He appears well-developed. No distress.  obese  HENT:  Mouth/Throat: Oropharynx is clear and moist.  Eyes: Conjunctivae are normal. Pupils are equal, round, and reactive to light.  Neck: Normal range of motion. No JVD present. No thyromegaly present.  Cardiovascular: Normal rate, regular rhythm, normal heart sounds and intact distal pulses.  Exam reveals no gallop and no friction rub.   No murmur heard. Pulmonary/Chest: Effort normal and breath sounds normal. No respiratory distress. He has no wheezes. He has no rales. He exhibits no tenderness.  Abdominal: Soft. Bowel sounds are normal. He exhibits no distension and no mass. There is no tenderness. There is no rebound and no guarding.  Musculoskeletal: Normal range of motion. He exhibits no edema and no tenderness.  Lymphadenopathy:    He has no cervical adenopathy.  Neurological: He is alert and oriented to person, place, and time. He has normal reflexes. No cranial nerve deficit. He exhibits normal muscle tone. Coordination normal.  Skin: Skin is warm and dry. No rash noted.  Psychiatric: He has a normal mood and affect. His behavior is normal. Judgment and thought content normal.  LS is tender L testis is not tender Strait leg elev is (+) L   Lab Results  Component Value Date   WBC 8.7 11/16/2013  HGB 16.2 11/16/2013   HCT 46.3 11/16/2013   PLT 157.0 11/16/2013   GLUCOSE 92 11/16/2013   CHOL 126 10/18/2013   TRIG 376.0* 10/18/2013   HDL 36.00* 10/18/2013   LDLDIRECT 48.0 05/28/2012   LDLCALC 15 10/18/2013   ALT 38 12/22/2012   AST 25 12/22/2012   NA 140 11/16/2013   K 4.1 11/16/2013   CL 107 11/16/2013   CREATININE 0.8 11/16/2013    BUN 16 11/16/2013   CO2 27 11/16/2013   TSH 2.08 05/28/2012   PSA 0.36 05/28/2012   INR 0.9 11/16/2013   HGBA1C 5.6 03/08/2011    12/12/13 LS MRI FINDINGS:  Normal signal is present in the conus medullaris which terminates at  T12-L1. Marrow signal and vertebral body heights are normal. There  is chronic loss of disc height at L4-5 and L5-S1.  Limited imaging the abdomen is unremarkable.  L1-2: Negative.  L2-3: A broad-based disc protrusion is present. Short pedicles are  evident. There is no significant focal stenosis.  L3-4: Mild broad-based disc protrusion and short pedicles results  and mild to moderate foraminal stenosis bilaterally, worse on the  left.  L4-5: A broad-based disc protrusion is more prominent on the right.  Short pedicles and mild facet hypertrophy are noted. Moderate right  foraminal stenosis is present. Mild subarticular narrowing is worse  on the left. Mild left foraminal stenosis is present.  L5-S1: A rightward disc protrusion is present. Moderate facet  hypertrophy is evident. This results in moderate right subarticular  stenosis and right greater than left foraminal narrowing.  IMPRESSION:  1. 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  2. Mild to moderate foraminal stenosis bilaterally at L3-4 is worse  on the left.  3. Moderate right and mild left foraminal stenosis at L4-5.  4. Mild subarticular stenosis at L4-5 is worse on the left.  5. Moderate right subarticular stenosis at L5-S1 with mild to  moderate foraminal narrowing, right greater than left.       Assessment & Plan:

## 2014-03-11 NOTE — Progress Notes (Signed)
Pre visit review using our clinic review tool, if applicable. No additional management support is needed unless otherwise documented below in the visit note. 

## 2014-04-21 ENCOUNTER — Encounter (HOSPITAL_COMMUNITY): Payer: Self-pay | Admitting: Cardiology

## 2014-04-25 ENCOUNTER — Telehealth: Payer: Self-pay | Admitting: Internal Medicine

## 2014-04-25 DIAGNOSIS — G4733 Obstructive sleep apnea (adult) (pediatric): Secondary | ICD-10-CM

## 2014-04-25 NOTE — Telephone Encounter (Signed)
Pt called in and said that his Bi PAP machine is wore out and not working.  Per this ins he can get a new one every 5 years.  He wanted to see if Dr Camila Li can write a script for new one to go to [facility] before the end of the year.  He said that they water tank is leaking and it is not working properly.

## 2014-04-25 NOTE — Telephone Encounter (Signed)
Ok Pls call [facility] to arrange . I'll sign orders Thx

## 2014-04-26 NOTE — Telephone Encounter (Signed)
Called pt [facility] md ok order. Generated DME fax to [facility]...[name]

## 2014-06-13 ENCOUNTER — Ambulatory Visit (INDEPENDENT_AMBULATORY_CARE_PROVIDER_SITE_OTHER): Payer: 59 | Admitting: Internal Medicine

## 2014-06-13 ENCOUNTER — Encounter: Payer: Self-pay | Admitting: Internal Medicine

## 2014-06-13 VITALS — BP 152/84 | HR 93 | Temp 97.9°F | Wt 318.0 lb

## 2014-06-13 DIAGNOSIS — I2510 Atherosclerotic heart disease of native coronary artery without angina pectoris: Secondary | ICD-10-CM

## 2014-06-13 DIAGNOSIS — M25531 Pain in right wrist: Secondary | ICD-10-CM | POA: Insufficient documentation

## 2014-06-13 DIAGNOSIS — F411 Generalized anxiety disorder: Secondary | ICD-10-CM

## 2014-06-13 DIAGNOSIS — I2583 Coronary atherosclerosis due to lipid rich plaque: Principal | ICD-10-CM

## 2014-06-13 MED ORDER — ALPRAZOLAM 2 MG PO TABS: ORAL_TABLET | ORAL | Status: DC

## 2014-06-13 MED ORDER — ISOSORBIDE MONONITRATE ER 60 MG PO TB24: 60.0000 mg | ORAL_TABLET | Freq: Every day | ORAL | Status: DC

## 2014-06-13 MED ORDER — NITROGLYCERIN 0.4 MG SL SUBL: 0.4000 mg | SUBLINGUAL_TABLET | SUBLINGUAL | Status: DC | PRN

## 2014-06-13 MED ORDER — NAPROXEN 500 MG PO TABS: ORAL_TABLET | ORAL | Status: DC

## 2014-06-13 MED ORDER — LOSARTAN POTASSIUM 100 MG PO TABS: 100.0000 mg | ORAL_TABLET | Freq: Every day | ORAL | Status: DC

## 2014-06-13 MED ORDER — TRAMADOL HCL 50 MG PO TABS: 50.0000 mg | ORAL_TABLET | ORAL | Status: DC

## 2014-06-13 MED ORDER — ATORVASTATIN CALCIUM 20 MG PO TABS: 20.0000 mg | ORAL_TABLET | Freq: Every day | ORAL | Status: DC

## 2014-06-13 NOTE — Patient Instructions (Signed)
Low carb diet 

## 2014-06-13 NOTE — Assessment & Plan Note (Signed)
Continue with current prescription therapy as reflected on the Med list. Stop smoking F/u w/Cardiology

## 2014-06-13 NOTE — Progress Notes (Signed)
Subjective:     Back Pain Pertinent negatives include no chest pain or weakness.   F/u L testicle pain - better LLE is weak, LLE pain irradiation Better w/Prednisone He had a cold, took a Z pac   [name] got re-married in 8/14   F/u some CPs, SOB - smoking 1-2 ppd, gained wt The patient is here to follow up on chronic HTN, anxiety, elev glu and chronic moderate LBP symptoms controlled with medicines. His brother died of a MI at 36. F/u memory issues - some better Not loosing wt lately.   Review of Systems  Constitutional: Negative for appetite change, fatigue and unexpected weight change.  HENT: Negative for congestion, nosebleeds, sneezing, sore throat and trouble swallowing.   Eyes: Negative for itching and visual disturbance.  Respiratory: Negative for cough.   Cardiovascular: Negative for chest pain, palpitations and leg swelling.  Gastrointestinal: Negative for nausea, diarrhea, blood in stool and abdominal distention.  Genitourinary: Negative for frequency and hematuria.  Musculoskeletal: Negative for back pain, joint swelling, gait problem and neck pain.  Skin: Negative for rash.  Neurological: Negative for dizziness, tremors, speech difficulty and weakness.  Psychiatric/Behavioral: Negative for suicidal ideas, sleep disturbance, dysphoric mood and agitation. The patient is not nervous/anxious.    Wt Readings from Last 3 Encounters:  03/11/14 312 lb (141.522 kg)  12/16/13 303 lb (137.44 kg)  11/25/13 307 lb (139.254 kg)   BP Readings from Last 3 Encounters:  03/11/14 142/70  12/16/13 130/68  11/25/13 110/72       Objective:   Physical Exam  Constitutional: He is oriented to person, place, and time. He appears well-developed. No distress.  obese  HENT:  Mouth/Throat: Oropharynx is clear and moist.  Eyes: Conjunctivae are normal. Pupils are equal, round, and reactive to light.  Neck: Normal range of motion. No JVD present. No thyromegaly present.   Cardiovascular: Normal rate, regular rhythm, normal heart sounds and intact distal pulses.  Exam reveals no gallop and no friction rub.   No murmur heard. Pulmonary/Chest: Effort normal and breath sounds normal. No respiratory distress. He has no wheezes. He has no rales. He exhibits no tenderness.  Abdominal: Soft. Bowel sounds are normal. He exhibits no distension and no mass. There is no tenderness. There is no rebound and no guarding.  Musculoskeletal: Normal range of motion. He exhibits no edema or tenderness.  Lymphadenopathy:    He has no cervical adenopathy.  Neurological: He is alert and oriented to person, place, and time. He has normal reflexes. No cranial nerve deficit. He exhibits normal muscle tone. Coordination normal.  Skin: Skin is warm and dry. No rash noted.  Psychiatric: He has a normal mood and affect. His behavior is normal. Judgment and thought content normal.  LS is tender L testis is not tender Strait leg elev is (+) L   Lab Results  Component Value Date   WBC 8.7 11/16/2013   HGB 16.2 11/16/2013   HCT 46.3 11/16/2013   PLT 157.0 11/16/2013   GLUCOSE 92 11/16/2013   CHOL 126 10/18/2013   TRIG 376.0* 10/18/2013   HDL 36.00* 10/18/2013   LDLDIRECT 48.0 05/28/2012   LDLCALC 15 10/18/2013   ALT 38 12/22/2012   AST 25 12/22/2012   NA 140 11/16/2013   K 4.1 11/16/2013   CL 107 11/16/2013   CREATININE 0.8 11/16/2013   BUN 16 11/16/2013   CO2 27 11/16/2013   TSH 2.08 05/28/2012   PSA 0.36 05/28/2012   INR 0.9  11/16/2013   HGBA1C 5.6 03/08/2011    12/12/13 LS MRI FINDINGS:  Normal signal is present in the conus medullaris which terminates at  T12-L1. Marrow signal and vertebral body heights are normal. There  is chronic loss of disc height at L4-5 and L5-S1.  Limited imaging the abdomen is unremarkable.  L1-2: Negative.  L2-3: A broad-based disc protrusion is present. Short pedicles are  evident. There is no significant focal stenosis.  L3-4: Mild  broad-based disc protrusion and short pedicles results  and mild to moderate foraminal stenosis bilaterally, worse on the  left.  L4-5: A broad-based disc protrusion is more prominent on the right.  Short pedicles and mild facet hypertrophy are noted. Moderate right  foraminal stenosis is present. Mild subarticular narrowing is worse  on the left. Mild left foraminal stenosis is present.  L5-S1: A rightward disc protrusion is present. Moderate facet  hypertrophy is evident. This results in moderate right subarticular  stenosis and right greater than left foraminal narrowing.  IMPRESSION:  1. 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  2. Mild to moderate foraminal stenosis bilaterally at L3-4 is worse  on the left.  3. Moderate right and mild left foraminal stenosis at L4-5.  4. Mild subarticular stenosis at L4-5 is worse on the left.  5. Moderate right subarticular stenosis at L5-S1 with mild to  moderate foraminal narrowing, right greater than left.       Assessment & Plan:

## 2014-06-13 NOTE — Assessment & Plan Note (Signed)
Continue with current prescription therapy as reflected on the Med list.  

## 2014-06-13 NOTE — Progress Notes (Signed)
Pre visit review using our clinic review tool, if applicable. No additional management support is needed unless otherwise documented below in the visit note. 

## 2014-06-13 NOTE — Progress Notes (Signed)
Subjective:     HPI   F/u LBP is better F/u L testicle pain - better He hit his R wrist 2 wks ago     Zalen got re-married in 8/14   F/u chronic CPs - recurrent, chronic SOB - smoking 2 ppd, gained wt again  The patient is here to follow up on chronic HTN, anxiety, elev glu and chronic moderate LBP symptoms controlled with medicines. His brother died of a MI at 24. F/u memory issues - some better Not loosing wt lately.   Review of Systems  Constitutional: Negative for appetite change, fatigue and unexpected weight change.  HENT: Negative for congestion, nosebleeds, sneezing, sore throat and trouble swallowing.   Eyes: Negative for itching and visual disturbance.  Respiratory: Negative for cough.   Cardiovascular: Negative for palpitations and leg swelling.  Gastrointestinal: Negative for nausea, diarrhea, blood in stool and abdominal distention.  Genitourinary: Negative for frequency and hematuria.  Musculoskeletal: Negative for joint swelling, gait problem and neck pain.  Skin: Negative for rash.  Neurological: Negative for dizziness, tremors and speech difficulty.  Psychiatric/Behavioral: Negative for suicidal ideas, sleep disturbance, dysphoric mood and agitation. The patient is not nervous/anxious.    Wt Readings from Last 3 Encounters:  06/13/14 318 lb (144.244 kg)  03/11/14 312 lb (141.522 kg)  12/16/13 303 lb (137.44 kg)   BP Readings from Last 3 Encounters:  06/13/14 152/84  03/11/14 142/70  12/16/13 130/68       Objective:   Physical Exam  Constitutional: He is oriented to person, place, and time. He appears well-developed. No distress.  obese  HENT:  Mouth/Throat: Oropharynx is clear and moist.  Eyes: Conjunctivae are normal. Pupils are equal, round, and reactive to light.  Neck: Normal range of motion. No JVD present. No thyromegaly present.  Cardiovascular: Normal rate, regular rhythm, normal heart sounds and intact distal pulses.  Exam reveals  no gallop and no friction rub.   No murmur heard. Pulmonary/Chest: Effort normal and breath sounds normal. No respiratory distress. He has no wheezes. He has no rales. He exhibits no tenderness.  Abdominal: Soft. Bowel sounds are normal. He exhibits no distension and no mass. There is no tenderness. There is no rebound and no guarding.  Musculoskeletal: Normal range of motion. He exhibits no edema or tenderness.  Lymphadenopathy:    He has no cervical adenopathy.  Neurological: He is alert and oriented to person, place, and time. He has normal reflexes. No cranial nerve deficit. He exhibits normal muscle tone. Coordination normal.  Skin: Skin is warm and dry. No rash noted.  Psychiatric: He has a normal mood and affect. His behavior is normal. Judgment and thought content normal.  LS is not tender R lat wrist hurts   Lab Results  Component Value Date   WBC 8.7 11/16/2013   HGB 16.2 11/16/2013   HCT 46.3 11/16/2013   PLT 157.0 11/16/2013   GLUCOSE 92 11/16/2013   CHOL 126 10/18/2013   TRIG 376.0* 10/18/2013   HDL 36.00* 10/18/2013   LDLDIRECT 48.0 05/28/2012   LDLCALC 15 10/18/2013   ALT 38 12/22/2012   AST 25 12/22/2012   NA 140 11/16/2013   K 4.1 11/16/2013   CL 107 11/16/2013   CREATININE 0.8 11/16/2013   BUN 16 11/16/2013   CO2 27 11/16/2013   TSH 2.08 05/28/2012   PSA 0.36 05/28/2012   INR 0.9 11/16/2013   HGBA1C 5.6 03/08/2011    12/12/13 LS MRI FINDINGS:  Normal signal is  present in the conus medullaris which terminates at  T12-L1. Marrow signal and vertebral body heights are normal. There  is chronic loss of disc height at L4-5 and L5-S1.  Limited imaging the abdomen is unremarkable.  L1-2: Negative.  L2-3: A broad-based disc protrusion is present. Short pedicles are  evident. There is no significant focal stenosis.  L3-4: Mild broad-based disc protrusion and short pedicles results  and mild to moderate foraminal stenosis bilaterally, worse on the  left.  L4-5:  A broad-based disc protrusion is more prominent on the right.  Short pedicles and mild facet hypertrophy are noted. Moderate right  foraminal stenosis is present. Mild subarticular narrowing is worse  on the left. Mild left foraminal stenosis is present.  L5-S1: A rightward disc protrusion is present. Moderate facet  hypertrophy is evident. This results in moderate right subarticular  stenosis and right greater than left foraminal narrowing.  IMPRESSION:  1. 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  2. Mild to moderate foraminal stenosis bilaterally at L3-4 is worse  on the left.  3. Moderate right and mild left foraminal stenosis at L4-5.  4. Mild subarticular stenosis at L4-5 is worse on the left.  5. Moderate right subarticular stenosis at L5-S1 with mild to  moderate foraminal narrowing, right greater than left.       Assessment & Plan:

## 2014-06-13 NOTE — Assessment & Plan Note (Addendum)
X ray if not better

## 2014-07-05 ENCOUNTER — Encounter: Payer: Self-pay | Admitting: Gastroenterology

## 2014-07-07 ENCOUNTER — Other Ambulatory Visit: Payer: Self-pay | Admitting: Internal Medicine

## 2014-07-07 NOTE — Telephone Encounter (Signed)
Ok to Rf in PCP absence? 

## 2014-07-17 ENCOUNTER — Inpatient Hospital Stay (HOSPITAL_BASED_OUTPATIENT_CLINIC_OR_DEPARTMENT_OTHER)
Admission: EM | Admit: 2014-07-17 | Discharge: 2014-07-20 | DRG: 193 | Discharge status: Against Medical Advice | Payer: 59 | Attending: Pulmonary Disease | Admitting: Pulmonary Disease

## 2014-07-17 ENCOUNTER — Encounter (HOSPITAL_BASED_OUTPATIENT_CLINIC_OR_DEPARTMENT_OTHER): Payer: Self-pay | Admitting: *Deleted

## 2014-07-17 ENCOUNTER — Emergency Department (HOSPITAL_BASED_OUTPATIENT_CLINIC_OR_DEPARTMENT_OTHER): Payer: 59

## 2014-07-17 DIAGNOSIS — Z9289 Personal history of other medical treatment: Secondary | ICD-10-CM

## 2014-07-17 DIAGNOSIS — R0602 Shortness of breath: Secondary | ICD-10-CM | POA: Diagnosis present

## 2014-07-17 DIAGNOSIS — G9341 Metabolic encephalopathy: Secondary | ICD-10-CM | POA: Diagnosis present

## 2014-07-17 DIAGNOSIS — E119 Type 2 diabetes mellitus without complications: Secondary | ICD-10-CM | POA: Diagnosis present

## 2014-07-17 DIAGNOSIS — J9601 Acute respiratory failure with hypoxia: Secondary | ICD-10-CM

## 2014-07-17 DIAGNOSIS — Z7982 Long term (current) use of aspirin: Secondary | ICD-10-CM | POA: Diagnosis not present

## 2014-07-17 DIAGNOSIS — I252 Old myocardial infarction: Secondary | ICD-10-CM | POA: Diagnosis not present

## 2014-07-17 DIAGNOSIS — J45909 Unspecified asthma, uncomplicated: Secondary | ICD-10-CM | POA: Diagnosis present

## 2014-07-17 DIAGNOSIS — E861 Hypovolemia: Secondary | ICD-10-CM | POA: Diagnosis present

## 2014-07-17 DIAGNOSIS — J441 Chronic obstructive pulmonary disease with (acute) exacerbation: Secondary | ICD-10-CM | POA: Diagnosis not present

## 2014-07-17 DIAGNOSIS — F17210 Nicotine dependence, cigarettes, uncomplicated: Secondary | ICD-10-CM | POA: Diagnosis present

## 2014-07-17 DIAGNOSIS — G4733 Obstructive sleep apnea (adult) (pediatric): Secondary | ICD-10-CM | POA: Diagnosis present

## 2014-07-17 DIAGNOSIS — D6959 Other secondary thrombocytopenia: Secondary | ICD-10-CM | POA: Diagnosis present

## 2014-07-17 DIAGNOSIS — J449 Chronic obstructive pulmonary disease, unspecified: Secondary | ICD-10-CM | POA: Diagnosis present

## 2014-07-17 DIAGNOSIS — K219 Gastro-esophageal reflux disease without esophagitis: Secondary | ICD-10-CM | POA: Diagnosis present

## 2014-07-17 DIAGNOSIS — G4730 Sleep apnea, unspecified: Secondary | ICD-10-CM | POA: Diagnosis present

## 2014-07-17 DIAGNOSIS — F419 Anxiety disorder, unspecified: Secondary | ICD-10-CM | POA: Diagnosis present

## 2014-07-17 DIAGNOSIS — I10 Essential (primary) hypertension: Secondary | ICD-10-CM | POA: Diagnosis present

## 2014-07-17 DIAGNOSIS — Z6841 Body Mass Index (BMI) 40.0 and over, adult: Secondary | ICD-10-CM

## 2014-07-17 DIAGNOSIS — F411 Generalized anxiety disorder: Secondary | ICD-10-CM | POA: Diagnosis present

## 2014-07-17 DIAGNOSIS — E785 Hyperlipidemia, unspecified: Secondary | ICD-10-CM | POA: Diagnosis present

## 2014-07-17 DIAGNOSIS — R0902 Hypoxemia: Secondary | ICD-10-CM

## 2014-07-17 DIAGNOSIS — I2510 Atherosclerotic heart disease of native coronary artery without angina pectoris: Secondary | ICD-10-CM | POA: Diagnosis present

## 2014-07-17 DIAGNOSIS — J189 Pneumonia, unspecified organism: Secondary | ICD-10-CM | POA: Diagnosis present

## 2014-07-17 DIAGNOSIS — J101 Influenza due to other identified influenza virus with other respiratory manifestations: Principal | ICD-10-CM | POA: Diagnosis present

## 2014-07-17 DIAGNOSIS — F17200 Nicotine dependence, unspecified, uncomplicated: Secondary | ICD-10-CM | POA: Diagnosis present

## 2014-07-17 HISTORY — DX: Acute respiratory failure with hypoxia: J96.01

## 2014-07-17 LAB — COMPREHENSIVE METABOLIC PANEL
ALT: 31 U/L (ref 0–53)
ANION GAP: 1 — AB (ref 5–15)
AST: 27 U/L (ref 0–37)
Albumin: 4.5 g/dL (ref 3.5–5.2)
Alkaline Phosphatase: 60 U/L (ref 39–117)
BUN: 18 mg/dL (ref 6–23)
CO2: 26 mmol/L (ref 19–32)
Calcium: 8.7 mg/dL (ref 8.4–10.5)
Chloride: 108 mmol/L (ref 96–112)
Creatinine, Ser: 0.79 mg/dL (ref 0.50–1.35)
GFR calc Af Amer: >90 mL/min (ref >90)
Glucose, Bld: 118 mg/dL — ABNORMAL HIGH (ref 70–99)
Potassium: 4.3 mmol/L (ref 3.5–5.1)
Sodium: 135 mmol/L (ref 135–145)
TOTAL PROTEIN: 7.4 g/dL (ref 6.0–8.3)
Total Bilirubin: 2 mg/dL — ABNORMAL HIGH (ref 0.3–1.2)

## 2014-07-17 LAB — CBC
HEMATOCRIT: 45.8 % (ref 39.0–52.0)
Hemoglobin: 15.5 g/dL (ref 13.0–17.0)
MCH: 31.2 pg (ref 26.0–34.0)
MCHC: 33.8 g/dL (ref 30.0–36.0)
MCV: 92.2 fL (ref 78.0–100.0)
Platelets: 117 10*3/uL — ABNORMAL LOW (ref 150–400)
RBC: 4.97 MIL/uL (ref 4.22–5.81)
RDW: 13.5 % (ref 11.5–15.5)
WBC: 7.7 10*3/uL (ref 4.0–10.5)

## 2014-07-17 LAB — CBC WITH DIFFERENTIAL/PLATELET
BASOS PCT: 0 % (ref 0–1)
Basophils Absolute: 0 10*3/uL (ref 0.0–0.1)
EOS PCT: 0 % (ref 0–5)
Eosinophils Absolute: 0 10*3/uL (ref 0.0–0.7)
HEMATOCRIT: 45.1 % (ref 39.0–52.0)
Hemoglobin: 15.6 g/dL (ref 13.0–17.0)
LYMPHS ABS: 1.5 10*3/uL (ref 0.7–4.0)
Lymphocytes Relative: 15 % (ref 12–46)
MCH: 31.3 pg (ref 26.0–34.0)
MCHC: 34.6 g/dL (ref 30.0–36.0)
MCV: 90.4 fL (ref 78.0–100.0)
MONOS PCT: 10 % (ref 3–12)
Monocytes Absolute: 0.9 10*3/uL (ref 0.1–1.0)
NEUTROS ABS: 7.4 10*3/uL (ref 1.7–7.7)
Neutrophils Relative %: 75 % (ref 43–77)
Platelets: 123 10*3/uL — ABNORMAL LOW (ref 150–400)
RBC: 4.99 MIL/uL (ref 4.22–5.81)
RDW: 13.6 % (ref 11.5–15.5)
WBC: 9.9 10*3/uL (ref 4.0–10.5)

## 2014-07-17 LAB — URINALYSIS, ROUTINE W REFLEX MICROSCOPIC
BILIRUBIN URINE: NEGATIVE
Glucose, UA: 100 mg/dL — AB
Ketones, ur: NEGATIVE mg/dL
Leukocytes, UA: NEGATIVE
NITRITE: NEGATIVE
PROTEIN: NEGATIVE mg/dL
SPECIFIC GRAVITY, URINE: 1.021 (ref 1.005–1.030)
UROBILINOGEN UA: 1 mg/dL (ref 0.0–1.0)
pH: 6 (ref 5.0–8.0)

## 2014-07-17 LAB — URINE MICROSCOPIC-ADD ON

## 2014-07-17 LAB — INFLUENZA PANEL BY PCR (TYPE A & B)
H1N1FLUPCR: DETECTED — AB
INFLAPCR: POSITIVE — AB
INFLBPCR: NEGATIVE

## 2014-07-17 LAB — BRAIN NATRIURETIC PEPTIDE: B NATRIURETIC PEPTIDE 5: 12.5 pg/mL (ref 0.0–100.0)

## 2014-07-17 LAB — STREP PNEUMONIAE URINARY ANTIGEN: Strep Pneumo Urinary Antigen: NEGATIVE

## 2014-07-17 LAB — CREATININE, SERUM
Creatinine, Ser: 1.06 mg/dL (ref 0.50–1.35)
GFR calc Af Amer: >90 mL/min (ref >90)
GFR calc non Af Amer: 79 mL/min — ABNORMAL LOW (ref >90)

## 2014-07-17 LAB — D-DIMER, QUANTITATIVE (NOT AT ARMC): D-Dimer, Quant: 0.43 ug/mL-FEU (ref 0.00–0.48)

## 2014-07-17 LAB — TROPONIN I: Troponin I: <0.03 ng/mL (ref <0.031)

## 2014-07-17 MED ORDER — HEPARIN SODIUM (PORCINE) 5000 UNIT/ML IJ SOLN
5000.0000 [IU] | Freq: Three times a day (TID) | INTRAMUSCULAR | Status: DC
  Administered 2014-07-17 – 2014-07-20 (×9): 5000 [IU] via SUBCUTANEOUS
  Filled 2014-07-17 (×11): qty 1

## 2014-07-17 MED ORDER — BUDESONIDE 0.25 MG/2ML IN SUSP
0.2500 mg | Freq: Two times a day (BID) | RESPIRATORY_TRACT | Status: DC
  Administered 2014-07-17 – 2014-07-19 (×4): 0.25 mg via RESPIRATORY_TRACT
  Filled 2014-07-17 (×6): qty 2

## 2014-07-17 MED ORDER — IPRATROPIUM-ALBUTEROL 0.5-2.5 (3) MG/3ML IN SOLN
3.0000 mL | Freq: Four times a day (QID) | RESPIRATORY_TRACT | Status: DC
  Administered 2014-07-17 (×2): 3 mL via RESPIRATORY_TRACT
  Filled 2014-07-17 (×2): qty 3

## 2014-07-17 MED ORDER — ATORVASTATIN CALCIUM 20 MG PO TABS
20.0000 mg | ORAL_TABLET | Freq: Every day | ORAL | Status: DC
  Administered 2014-07-17 – 2014-07-19 (×3): 20 mg via ORAL
  Filled 2014-07-17 (×5): qty 1

## 2014-07-17 MED ORDER — IBUPROFEN 800 MG PO TABS
ORAL_TABLET | ORAL | Status: AC
  Filled 2014-07-17: qty 1

## 2014-07-17 MED ORDER — IPRATROPIUM-ALBUTEROL 0.5-2.5 (3) MG/3ML IN SOLN: 3.0000 mL | Freq: Four times a day (QID) | RESPIRATORY_TRACT | Status: DC | PRN

## 2014-07-17 MED ORDER — LEVOFLOXACIN IN D5W 750 MG/150ML IV SOLN
750.0000 mg | INTRAVENOUS | Status: DC
  Administered 2014-07-17 – 2014-07-19 (×3): 750 mg via INTRAVENOUS
  Filled 2014-07-17 (×5): qty 150

## 2014-07-17 MED ORDER — ASPIRIN 81 MG PO CHEW
81.0000 mg | CHEWABLE_TABLET | Freq: Every day | ORAL | Status: DC
  Administered 2014-07-17 – 2014-07-19 (×3): 81 mg via ORAL
  Filled 2014-07-17 (×4): qty 1

## 2014-07-17 MED ORDER — ALBUTEROL SULFATE (2.5 MG/3ML) 0.083% IN NEBU
2.5000 mg | INHALATION_SOLUTION | Freq: Once | RESPIRATORY_TRACT | Status: AC
  Administered 2014-07-17: 2.5 mg via RESPIRATORY_TRACT
  Filled 2014-07-17: qty 3

## 2014-07-17 MED ORDER — IBUPROFEN 800 MG PO TABS
800.0000 mg | ORAL_TABLET | Freq: Once | ORAL | Status: AC
  Administered 2014-07-17: 800 mg via ORAL

## 2014-07-17 MED ORDER — METHYLPREDNISOLONE SODIUM SUCC 125 MG IJ SOLR
60.0000 mg | Freq: Three times a day (TID) | INTRAMUSCULAR | Status: DC
  Administered 2014-07-17 – 2014-07-18 (×3): 60 mg via INTRAVENOUS
  Filled 2014-07-17 (×7): qty 0.96

## 2014-07-17 MED ORDER — NITROGLYCERIN 0.4 MG SL SUBL: 0.4000 mg | SUBLINGUAL_TABLET | SUBLINGUAL | Status: DC | PRN

## 2014-07-17 MED ORDER — LOSARTAN POTASSIUM 50 MG PO TABS
100.0000 mg | ORAL_TABLET | Freq: Every day | ORAL | Status: DC
  Administered 2014-07-17: 100 mg via ORAL
  Filled 2014-07-17 (×2): qty 2

## 2014-07-17 MED ORDER — OSELTAMIVIR PHOSPHATE 75 MG PO CAPS
75.0000 mg | ORAL_CAPSULE | Freq: Two times a day (BID) | ORAL | Status: DC
  Administered 2014-07-17 – 2014-07-19 (×5): 75 mg via ORAL
  Filled 2014-07-17 (×7): qty 1

## 2014-07-17 MED ORDER — CETYLPYRIDINIUM CHLORIDE 0.05 % MT LIQD
7.0000 mL | Freq: Two times a day (BID) | OROMUCOSAL | Status: DC
  Administered 2014-07-18 – 2014-07-19 (×3): 7 mL via OROMUCOSAL

## 2014-07-17 MED ORDER — BENZONATATE 100 MG PO CAPS
200.0000 mg | ORAL_CAPSULE | Freq: Three times a day (TID) | ORAL | Status: DC
  Administered 2014-07-17 – 2014-07-19 (×6): 200 mg via ORAL
  Filled 2014-07-17 (×9): qty 2

## 2014-07-17 MED ORDER — TRAMADOL HCL 50 MG PO TABS
50.0000 mg | ORAL_TABLET | Freq: Every morning | ORAL | Status: DC
  Administered 2014-07-17 – 2014-07-19 (×3): 50 mg via ORAL
  Filled 2014-07-17 (×3): qty 1

## 2014-07-17 MED ORDER — METHYLPREDNISOLONE SODIUM SUCC 125 MG IJ SOLR
125.0000 mg | Freq: Once | INTRAMUSCULAR | Status: AC
  Administered 2014-07-17: 125 mg via INTRAVENOUS
  Filled 2014-07-17: qty 2

## 2014-07-17 MED ORDER — IPRATROPIUM-ALBUTEROL 0.5-2.5 (3) MG/3ML IN SOLN
3.0000 mL | Freq: Once | RESPIRATORY_TRACT | Status: AC
  Administered 2014-07-17: 3 mL via RESPIRATORY_TRACT
  Filled 2014-07-17: qty 3

## 2014-07-17 MED ORDER — ALBUTEROL SULFATE (2.5 MG/3ML) 0.083% IN NEBU
2.5000 mg | INHALATION_SOLUTION | RESPIRATORY_TRACT | Status: DC | PRN
  Administered 2014-07-18: 2.5 mg via RESPIRATORY_TRACT
  Filled 2014-07-17: qty 3

## 2014-07-17 MED ORDER — GUAIFENESIN ER 600 MG PO TB12
600.0000 mg | ORAL_TABLET | Freq: Two times a day (BID) | ORAL | Status: DC
  Administered 2014-07-17 – 2014-07-19 (×4): 600 mg via ORAL
  Filled 2014-07-17 (×5): qty 1

## 2014-07-17 MED ORDER — ALPRAZOLAM 0.5 MG PO TABS
2.0000 mg | ORAL_TABLET | Freq: Three times a day (TID) | ORAL | Status: DC | PRN
  Administered 2014-07-17 – 2014-07-18 (×2): 2 mg via ORAL
  Filled 2014-07-17 (×2): qty 4

## 2014-07-17 MED ORDER — ISOSORBIDE MONONITRATE ER 60 MG PO TB24
60.0000 mg | ORAL_TABLET | Freq: Every day | ORAL | Status: DC
  Administered 2014-07-17: 60 mg via ORAL
  Filled 2014-07-17 (×2): qty 1

## 2014-07-17 MED ORDER — SODIUM CHLORIDE 0.9 % IV SOLN
INTRAVENOUS | Status: DC
  Administered 2014-07-17: 14:00:00 via INTRAVENOUS

## 2014-07-17 MED ORDER — HYDROCODONE-HOMATROPINE 5-1.5 MG/5ML PO SYRP
5.0000 mL | ORAL_SOLUTION | ORAL | Status: DC | PRN
  Administered 2014-07-17: 5 mL via ORAL
  Filled 2014-07-17: qty 5

## 2014-07-17 NOTE — Progress Notes (Signed)
Patient placed on bipap for HS.  Patient wears full face mask bipap at home but is unsure of settings.  Autotitration mode, min 8, max 24, used.  Patient tolerated well, minimal leakage with mask.  6 lpm 02 bled in, sats 94%, RR 22.  RN notified.

## 2014-07-17 NOTE — ED Notes (Signed)
Patient c/o sob since yesterday, non-productive cough, fever at times

## 2014-07-17 NOTE — Progress Notes (Signed)
Flu PCR returned positive for H1N1  Type A - sx's onset yesterday so I have ordered Tamiflu  Erin Hearing, ANP

## 2014-07-17 NOTE — Progress Notes (Signed)
RESPIRATORY DEPARTMENT NOTIFIED OF PATIENT'S REQUEST TO BE PLACED ON BIPAP A.S.A.P.

## 2014-07-17 NOTE — Progress Notes (Signed)
Patient presents with complaints of shortness of breath, cough, nonproductive sputum, hypoxic upon presentation to the ED, saturating 90% on 2 L nasal cannula , no official diagnosis of COPD in the past, but patient is a smoker, chest x-ray showing mild cardiomegaly and vascular congestion, but no history of CHF, BNP within normal limits , will be admitted to telemetry bed for COPD exacerbation , patient is tachycardic, hypoxic, so d-dimer is will be sent at [facility] . Phillips Climes MD

## 2014-07-17 NOTE — H&P (Signed)
[facility] History and Physical                                                                                    [name], is a 54 y.o. male  MRN: [id number]   DOB - [birth year]  Admit Date - 07/17/2014  Outpatient Primary MD for the patient is Walker Kehr, MD  With History of -  Past Medical History  Diagnosis Date  . Anxiety   . GERD (gastroesophageal reflux disease)   . Hyperlipidemia   . Diabetes mellitus      II, borderline, diet controlled  . 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 CAD (coronary artery disease)   . OSA (obstructive sleep apnea)     cpap -PSG apr30 2009, [facility]  119  . Pain, low back   . Tobacco use disorder   . Cocaine abuse     none now  . Obesity, unspecified       Past Surgical History  Procedure Laterality Date  . Fracture surgery      R. leg  . Fracture surgery      L. arm  . Back surgery      x 2  . Left heart catheterization with coronary angiogram N/A 11/22/2013    Procedure: LEFT HEART CATHETERIZATION WITH CORONARY ANGIOGRAM;  Surgeon: Larey Dresser, MD;  Location: [facility] CATH LAB;  Service: Cardiovascular;  Laterality: N/A;    in for   Chief Complaint  Patient presents with  . Shortness of Breath     HPI [name]  is a 54 y.o. male, coronary artery disease and reported remote MI, diabetes, obstructive sleep apnea and obesity, tobacco abuse and suspected underlying COPD who presented to [facility] with complaints of progressive shortness of breath with productive cough, chest burning. He reports that he awakened yesterday morning early with what he thought was a cold. He went to work but became progressively weaker so he returned home. He noticed increased shortness of breath that was not changed by exertion or lying flat. He has not had any lower extremity edema. He reports subjective fevers and feeling cold and clammy. He reports a productive cough with tan sputum. He  endorses that he received his flu shot this year.  In the ER his chest x-ray revealed mild cardiomegaly and vascular congestion with a normal BNP and no prior history of CHF. Last echocardiogram in 2011 revealed normal ejection fraction with mild LVH normal-appearing right ventricle and pulmonic valve. He was tachycardic with hypoxemia. He was also afebrile but tachypneic. He was placed on a Ventimask. D-dimer was normal 0.43. Initial troponin was normal at less than 0.03. He was transferred to [facility] with a presumptive diagnosis of COPD exacerbation.  Review of Systems   In addition to the HPI above,  Subjective Fever with chills, no myalgias or other constitutional symptoms No Headache, changes with Vision or hearing, new weakness, tingling, numbness in any extremity, No problems swallowing food or Liquids, indigestion/reflux No Chest pain, palpitations, orthopnea or DOE No Abdominal pain, N/V; no melena or hematochezia, no dark tarry stools, Bowel  movements are regular, No dysuria, hematuria or flank pain No new skin rashes, lesions, masses or bruises, No new joints pains-aches Recent intentional weight loss of 20 pounds No polyuria, polydypsia or polyphagia,  *A full 10 point Review of Systems was done, except as stated above, all other Review of Systems were negative.  Social History History  Substance Use Topics  . Smoking status: Current Every Day Smoker -- 1.50 packs/day for 25 years    Types: Cigarettes  . Smokeless tobacco: Never Used  . Alcohol Use: 6.0 oz/week    12 Standard drinks or equivalent per week    Family History Family History  Problem Relation Age of Onset  . Heart attack Father 66    grandfather died MI 23  . Heart disease Father     cad  . Heart attack    . Atrial fibrillation Other   . Cancer Mother     lung ca    Prior to Admission medications   Medication Sig Start Date End Date Taking? Authorizing Provider  alprazolam Duanne Moron) 2 MG tablet  TAKE 1 TABLET BY MOUTH THREE TIMES DAILY AS NEEDED FOR ANXIETY 07/07/14  Yes Janith Lima, MD  aspirin EC 81 MG tablet Take 1 tablet (81 mg total) by mouth daily. 02/05/12  Yes Larey Dresser, MD  atorvastatin (LIPITOR) 20 MG tablet Take 1 tablet (20 mg total) by mouth daily. 06/13/14  Yes Aleksei Plotnikov V, MD  isosorbide mononitrate (IMDUR) 60 MG 24 hr tablet Take 1 tablet (60 mg total) by mouth daily. 06/13/14  Yes Aleksei Plotnikov V, MD  losartan (COZAAR) 100 MG tablet Take 1 tablet (100 mg total) by mouth daily. 06/13/14 06/13/15 Yes Aleksei Plotnikov V, MD  nitroGLYCERIN (NITROSTAT) 0.4 MG SL tablet Place 1 tablet (0.4 mg total) under the tongue every 5 (five) minutes as needed. 06/13/14  Yes Aleksei Plotnikov V, MD  traMADol (ULTRAM) 50 MG tablet Take 1 tablet (50 mg total) by mouth every morning. 06/13/14  Yes Aleksei Plotnikov V, MD  naproxen (NAPROSYN) 500 MG tablet TAKE 1 TABLET BY MOUTH DAILY AS NEEDED 06/13/14   Cassandria Anger, MD    Allergies  Allergen Reactions  . Ace Inhibitors   . Lisinopril     cough    Physical Exam  Vitals  Blood pressure 144/77, pulse 108, temperature 98.6 F (37 C), temperature source Oral, resp. rate 26, SpO2 94 %.   General:  In mild acute distress as evidenced by paroxysmal coughing with repositioning and deep breathing, otherwise appears healthy and well nourished  Psych:  Normal affect, Denies Suicidal or Homicidal ideations, Awake Alert, Oriented X 3. Speech and thought patterns are clear and appropriate, no apparent short term memory deficits  Neuro:   No focal neurological deficits, CN II through XII intact, Strength 5/5 all 4 extremities, Sensation intact all 4 extremities.  ENT:  Ears and Eyes appear Normal, Conjunctivae clear, PER. Moist oral mucosa without erythema or exudates.  Neck:  Supple, No lymphadenopathy appreciated  Respiratory:  Symmetrical chest wall movement, mildly decreased air movement bilaterally, bilateral expiratory  crackles more prominent from the mid fields down. Paroxsysmal coughing witnessed with deep breathing and repositioning, 40% Ventimask   Cardiac:  RRR, No Murmurs, no LE edema noted, no JVD, No carotid bruits, peripheral pulses palpable at 2+  Abdomen:  Positive bowel sounds, Soft, Non tender, Non distended,  No masses appreciated, no obvious hepatosplenomegaly  Skin:  No Cyanosis, Normal Skin Turgor, No Skin Rash  or Bruise.  Extremities: Symmetrical without obvious trauma or injury,  no effusions.  Data Review  CBC  Recent Labs Lab 07/17/14 0725  WBC 9.9  HGB 15.6  HCT 45.1  PLT 123*  MCV 90.4  MCH 31.3  MCHC 34.6  RDW 13.6  LYMPHSABS 1.5  MONOABS 0.9  EOSABS 0.0  BASOSABS 0.0    Chemistries   Recent Labs Lab 07/17/14 0725  NA 135  K 4.3  CL 108  CO2 26  GLUCOSE 118*  BUN 18  CREATININE 0.79  CALCIUM 8.7  AST 27  ALT 31  ALKPHOS 60  BILITOT 2.0*    CrCl cannot be calculated (Unknown ideal weight.).  No results for input(s): TSH, T4TOTAL, T3FREE, THYROIDAB in the last 72 hours.  Invalid input(s): FREET3  Coagulation profile No results for input(s): INR, PROTIME in the last 168 hours.   Recent Labs  07/17/14 0730  DDIMER 0.43    Cardiac Enzymes  Recent Labs Lab 07/17/14 0725  TROPONINI <0.03    Invalid input(s): POCBNP  Urinalysis    Component Value Date/Time   COLORURINE LT. YELLOW 12/22/2012 0736   APPEARANCEUR CLEAR 12/22/2012 0736   LABSPEC 1.010 12/22/2012 0736   PHURINE 7.0 12/22/2012 0736   GLUCOSEU NEGATIVE 12/22/2012 0736   HGBUR TRACE-INTACT 12/22/2012 0736   BILIRUBINUR NEGATIVE 12/22/2012 0736   KETONESUR NEGATIVE 12/22/2012 0736   UROBILINOGEN 0.2 12/22/2012 0736   NITRITE NEGATIVE 12/22/2012 0736   LEUKOCYTESUR TRACE 12/22/2012 0736    Imaging results:   Dg Chest Port 1 View  07/17/2014   CLINICAL DATA:  Severe short of breath. Cough this morning. Cigarette smoker.  shortness of breath  EXAM: PORTABLE CHEST - 1  VIEW  COMPARISON:  10/12/2013.  FINDINGS: The cardiopericardial silhouette is enlarged. Lung volumes are lower than on prior. There is pulmonary vascular congestion. Respiratory motion is present on the exam, degrading evaluation of edema. No gross airspace consolidation. Panniculus projects over the inferior chest. Monitoring leads project over the chest.  IMPRESSION: Cardiomegaly and pulmonary vascular congestion suggesting mild CHF.   Electronically Signed   By: Dereck Ligas M.D.   On: 07/17/2014 07:49     EKG: Sinus tachycardia, QTC 439 ms   Assessment & Plan  Active Problems:   Acute hypoxic respiratory failure due to:   A) COPD exacerbation   B) Pneumonitis -Admit to telemetry -No current wheezing on exam and history and current symptomatology more suggestive of infectious etiology -Treat as community-acquired pneumonia but since has underlying COPD we'll utilize Levaquin -If develops wheezing can certainly add steroids but no indication at this juncture -Edematous-type changes seen on chest x-ray more consistent with pneumonitis and although patient reports receiving influenza vaccine as precaution we'll check influenza PCR -Hemodynamically stable and no signs of sepsis physiology -Since history of smoking and COPD we'll provide DuoNeb when necessary -Utilize Tessalon Perles and Hycodan for cough suppression -Flutter valve every 4 hours -IV fluid hydration for insensible volume loss    HTN  -Moderate control -Continue home medications for now    MYOCARDIAL INFARCTION, HX OF -Last cardiac catheterization was in July 2015 documented nonobstructive disease in the major coronaries with left ventriculography revealing a normal EF of 55-60%    OSA treated with BiPAP -Continue home BiPAP -Check echocardiogram to look for right side cardiac remodeling    Tobacco dependence -Patient declined nicotine patch    Dyslipidemia -Continue Lipitor    Morbid obesity -Patient  currently endorses has intentionally lost 20 pounds  Anxiety state -Continue home Xanax    GERD -not on Medications prior to admission    DVT Prophylaxis: Subcutaneous heparin  Family Communication:  Wife at bedside   Code Status:  Full code  Condition:  Stable  Time spent in minutes : 60   ELLIS,ALLISON L. ANP on 07/17/2014 at 12:46 PM  Between 7am to 7pm - Pager - [phone]  After 7pm go to [email or web address] - password [email or web address]  And look for the night coverage person covering me after hours  [facility]

## 2014-07-17 NOTE — ED Provider Notes (Signed)
CSN: [id number]     Arrival date & time 07/17/14  2836 History   First MD Initiated Contact with Patient 07/17/14 0701     No chief complaint on file.    (Consider location/radiation/quality/duration/timing/severity/associated sxs/prior Treatment) HPI Comments: Patient is a 54 year old male with history of small vessel coronary artery disease, diabetes, obesity, sleep apnea, and tobacco abuse. He presents today with difficulty breathing that started yesterday afternoon. He has a productive cough with burning in his chest. He has felt fever and chilled. He reports to smoking 2 packs of cigarettes per day. He denies any pain or swelling in his legs.  Patient is a 54 y.o. male presenting with shortness of breath. The history is provided by the patient.  Shortness of Breath Severity:  Moderate Onset quality:  Sudden Duration:  1 day Timing:  Constant Progression:  Worsening Chronicity:  New Context: activity and URI   Relieved by:  Nothing Worsened by:  Nothing tried Ineffective treatments:  None tried Associated symptoms: cough, fever and sputum production   Associated symptoms: no hemoptysis   Risk factors: tobacco use     Past Medical History  Diagnosis Date  . Anxiety   . GERD (gastroesophageal reflux disease)   . Hyperlipidemia   . Diabetes mellitus      II, borderline, diet controlled  . 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 CAD (coronary artery disease)   . OSA (obstructive sleep apnea)     cpap -PSG apr30 2009, AHI  119  . Pain, low back   . Tobacco use disorder   . Cocaine abuse     none now  . Obesity, unspecified    Past Surgical History  Procedure Laterality Date  . Fracture surgery      R. leg  . Fracture surgery      L. arm  . Back surgery      x 2  . Left heart catheterization with coronary angiogram N/A 11/22/2013    Procedure: LEFT HEART CATHETERIZATION WITH CORONARY ANGIOGRAM;  Surgeon: Larey Dresser, MD;  Location: [facility]  CATH LAB;  Service: Cardiovascular;  Laterality: N/A;   Family History  Problem Relation Age of Onset  . Heart attack Father 53    grandfather died MI 86  . Heart disease Father     cad  . Heart attack    . Atrial fibrillation Other   . Cancer Mother     lung ca   History  Substance Use Topics  . Smoking status: Current Every Day Smoker -- 1.50 packs/day for 25 years    Types: Cigarettes  . Smokeless tobacco: Never Used  . Alcohol Use: 6.0 oz/week    12 drink(s) per week    Review of Systems  Constitutional: Positive for fever.  Respiratory: Positive for cough, sputum production and shortness of breath. Negative for hemoptysis.   All other systems reviewed and are negative.     Allergies  Ace inhibitors and Lisinopril  Home Medications   Prior to Admission medications   Medication Sig Start Date End Date Taking? Authorizing Provider  alprazolam Duanne Moron) 2 MG tablet TAKE 1 TABLET BY MOUTH THREE TIMES DAILY AS NEEDED FOR ANXIETY 07/07/14   Janith Lima, MD  aspirin EC 81 MG tablet Take 1 tablet (81 mg total) by mouth daily. 02/05/12   Larey Dresser, MD  atorvastatin (LIPITOR) 20 MG tablet Take 1 tablet (20 mg total) by mouth daily. 06/13/14  Aleksei Plotnikov V, MD  isosorbide mononitrate (IMDUR) 60 MG 24 hr tablet Take 1 tablet (60 mg total) by mouth daily. 06/13/14   Aleksei Plotnikov V, MD  losartan (COZAAR) 100 MG tablet Take 1 tablet (100 mg total) by mouth daily. 06/13/14 06/13/15  Aleksei Plotnikov V, MD  naproxen (NAPROSYN) 500 MG tablet TAKE 1 TABLET BY MOUTH DAILY AS NEEDED 06/13/14   Lew Dawes V, MD  nitroGLYCERIN (NITROSTAT) 0.4 MG SL tablet Place 1 tablet (0.4 mg total) under the tongue every 5 (five) minutes as needed. 06/13/14   Aleksei Plotnikov V, MD  traMADol (ULTRAM) 50 MG tablet Take 1 tablet (50 mg total) by mouth every morning. 06/13/14   Aleksei Plotnikov V, MD   BP 119/68 mmHg  Pulse 113  Temp(Src) 98.6 F (37 C) (Oral)  Resp 26  SpO2  90% Physical Exam  Constitutional: He is oriented to person, place, and time. He appears well-developed and well-nourished. No distress.  Awake, alert, nontoxic appearance.  HENT:  Head: Normocephalic and atraumatic.  Eyes: Right eye exhibits no discharge. Left eye exhibits no discharge.  Neck: Normal range of motion. Neck supple.  Cardiovascular: Normal rate, regular rhythm and normal heart sounds.   No murmur heard. Pulmonary/Chest: Effort normal. He has wheezes. He has no rales. He exhibits no tenderness.  Abdominal: Soft. Bowel sounds are normal. There is no tenderness. There is no rebound.  Musculoskeletal: Normal range of motion. He exhibits no edema or tenderness.  Baseline ROM, no obvious new focal weakness.  Lymphadenopathy:    He has no cervical adenopathy.  Neurological: He is alert and oriented to person, place, and time.  Mental status and motor strength appears baseline for patient and situation.  Skin: Skin is warm and dry. No rash noted. He is not diaphoretic.  Psychiatric: He has a normal mood and affect.  Nursing note and vitals reviewed.   ED Course  Procedures (including critical care time) Labs Review Labs Reviewed  COMPREHENSIVE METABOLIC PANEL  BRAIN NATRIURETIC PEPTIDE  TROPONIN I  CBC WITH DIFFERENTIAL/PLATELET    Imaging Review No results found.   EKG Interpretation   Date/Time:  Sunday July 17 2014 07:20:32 EST Ventricular Rate:  115 PR Interval:  166 QRS Duration: 96 QT Interval:  318 QTC Calculation: 439 R Axis:   62 Text Interpretation:  Sinus tachycardia Otherwise normal ECG Confirmed by  DELOS  MD, Nikeisha Klutz ([id number]) on 07/17/2014 7:45:07 AM      MDM   Final diagnoses:  None    Patient presents with productive cough and difficulty breathing for the past 2 days. He has a history of small vessel coronary artery disease but nothing in the workup suggests a cardiac etiology. He is hypoxic and tachycardic. I suspect his symptoms are  related to undiagnosed COPD that has flared up in the presence of a viral URI. Patient has smoked 2 packs of cigarettes per day for many years.  He was given albuterol and steroids, yet remains hypoxic with an oxygen requirement. I've spoken with Dr. Waldron Labs from the hospitalist service who agrees to admit.    Veryl Speak, MD 07/17/14 [phone]

## 2014-07-17 NOTE — Progress Notes (Signed)
ANTIBIOTIC CONSULT NOTE - INITIAL  Pharmacy Consult for Levaquin Indication: pneumonia  Allergies  Allergen Reactions  . Ace Inhibitors   . Lisinopril     cough    Patient Measurements: Weight: 299 lb 13.2 oz (136 kg)   Vital Signs: Temp: 97.5 F (36.4 C) (03/06 1549) Temp Source: Oral (03/06 1549) BP: 121/55 mmHg (03/06 1549) Pulse Rate: 122 (03/06 1549) Intake/Output from previous day:   Intake/Output from this shift:    Labs:  Recent Labs  07/17/14 0725 07/17/14 1356  WBC 9.9 7.7  HGB 15.6 15.5  PLT 123* 117*  CREATININE 0.79 1.06   Estimated Creatinine Clearance: 116.5 mL/min (by C-G formula based on Cr of 1.06). No results for input(s): VANCOTROUGH, VANCOPEAK, VANCORANDOM, GENTTROUGH, GENTPEAK, GENTRANDOM, TOBRATROUGH, TOBRAPEAK, TOBRARND, AMIKACINPEAK, AMIKACINTROU, AMIKACIN in the last 72 hours.   Microbiology: No results found for this or any previous visit (from the past 720 hour(s)).  Medical History: Past Medical History  Diagnosis Date  . Anxiety   . GERD (gastroesophageal reflux disease)   . Hyperlipidemia   . Diabetes mellitus      II, borderline, diet controlled  . 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 CAD (coronary artery disease)   . OSA (obstructive sleep apnea)     cpap -PSG apr30 2009, AHI  119  . Pain, low back   . Tobacco use disorder   . Cocaine abuse     none now  . Obesity, unspecified     Medications:  Facility-administered medications prior to admission  Medication Dose Route Frequency Provider Last Rate Last Dose  . albuterol (PROVENTIL) (2.5 MG/3ML) 0.083% nebulizer solution 2.5 mg  2.5 mg Nebulization Once Aleksei Plotnikov V, MD       Prescriptions prior to admission  Medication Sig Dispense Refill Last Dose  . alprazolam (XANAX) 2 MG tablet TAKE 1 TABLET BY MOUTH THREE TIMES DAILY AS NEEDED FOR ANXIETY 90 tablet 0 Past Week at Unknown time  . aspirin EC 81 MG tablet Take 1 tablet (81  mg total) by mouth daily.   07/16/2014 at Unknown time  . atorvastatin (LIPITOR) 20 MG tablet Take 1 tablet (20 mg total) by mouth daily. 90 tablet 3 07/16/2014 at Unknown time  . isosorbide mononitrate (IMDUR) 60 MG 24 hr tablet Take 1 tablet (60 mg total) by mouth daily. 90 tablet 3 07/16/2014 at Unknown time  . losartan (COZAAR) 100 MG tablet Take 1 tablet (100 mg total) by mouth daily. 90 tablet 3 07/16/2014 at Unknown time  . nitroGLYCERIN (NITROSTAT) 0.4 MG SL tablet Place 1 tablet (0.4 mg total) under the tongue every 5 (five) minutes as needed. 20 tablet 3 Past Month at Unknown time  . traMADol (ULTRAM) 50 MG tablet Take 1 tablet (50 mg total) by mouth every morning. 60 tablet 2 07/16/2014 at Unknown time  . naproxen (NAPROSYN) 500 MG tablet TAKE 1 TABLET BY MOUTH DAILY AS NEEDED 90 tablet 1    Assessment: 54 year old male with history of CAD, diabetes, obesity, sleep apnea, and tobacco abuse. He presented with difficulty breathing that started yesterday afternoon. He has a productive cough, sputum production, SOB and burning in his chest. He has felt feverish and chilled. He reports to smoking 2 packs of cigarettes per day. Cxray shows cardiomegaly and pulmonary vascular congestion suggesting mild CHF.  Goal of Therapy:  Resolution of symptoms  Plan:  Current dose is adequate for renal function. levaquin 750mg  IV q24h  Trenton Gammon, Jehu Mccauslin L 07/17/2014,4:04 PM

## 2014-07-18 ENCOUNTER — Inpatient Hospital Stay (HOSPITAL_COMMUNITY): Payer: 59

## 2014-07-18 LAB — CBC
HCT: 44.3 % (ref 39.0–52.0)
HEMOGLOBIN: 15 g/dL (ref 13.0–17.0)
MCH: 30.8 pg (ref 26.0–34.0)
MCHC: 33.9 g/dL (ref 30.0–36.0)
MCV: 91 fL (ref 78.0–100.0)
Platelets: 145 10*3/uL — ABNORMAL LOW (ref 150–400)
RBC: 4.87 MIL/uL (ref 4.22–5.81)
RDW: 14 % (ref 11.5–15.5)
WBC: 12.2 10*3/uL — ABNORMAL HIGH (ref 4.0–10.5)

## 2014-07-18 LAB — LEGIONELLA ANTIGEN, URINE

## 2014-07-18 LAB — LACTIC ACID, PLASMA
LACTIC ACID, VENOUS: 1.5 mmol/L (ref 0.5–2.0)
Lactic Acid, Venous: 2.2 mmol/L (ref 0.5–2.0)

## 2014-07-18 LAB — BLOOD GAS, ARTERIAL
Acid-Base Excess: 0.1 mmol/L (ref 0.0–2.0)
Bicarbonate: 24.9 mEq/L — ABNORMAL HIGH (ref 20.0–24.0)
Drawn by: 281201
FIO2: 0.55 %
O2 SAT: 94.1 %
PATIENT TEMPERATURE: 98.6
TCO2: 26.2 mmol/L (ref 0–100)
pCO2 arterial: 44.7 mmHg (ref 35.0–45.0)
pH, Arterial: 7.364 (ref 7.350–7.450)
pO2, Arterial: 68.9 mmHg — ABNORMAL LOW (ref 80.0–100.0)

## 2014-07-18 LAB — URINE CULTURE
COLONY COUNT: NO GROWTH
CULTURE: NO GROWTH

## 2014-07-18 LAB — GLUCOSE, CAPILLARY
GLUCOSE-CAPILLARY: 177 mg/dL — AB (ref 70–99)
Glucose-Capillary: 178 mg/dL — ABNORMAL HIGH (ref 70–99)
Glucose-Capillary: 136 mg/dL — ABNORMAL HIGH (ref 70–99)
Glucose-Capillary: 128 mg/dL — ABNORMAL HIGH (ref 70–99)

## 2014-07-18 LAB — BASIC METABOLIC PANEL
ANION GAP: 6 (ref 5–15)
BUN: 18 mg/dL (ref 6–23)
CALCIUM: 8.9 mg/dL (ref 8.4–10.5)
CO2: 26 mmol/L (ref 19–32)
CREATININE: 0.94 mg/dL (ref 0.50–1.35)
Chloride: 107 mmol/L (ref 96–112)
GFR calc Af Amer: >90 mL/min (ref >90)
GFR calc non Af Amer: >90 mL/min (ref >90)
GLUCOSE: 166 mg/dL — AB (ref 70–99)
POTASSIUM: 4.5 mmol/L (ref 3.5–5.1)
SODIUM: 139 mmol/L (ref 135–145)

## 2014-07-18 LAB — MRSA PCR SCREENING: MRSA BY PCR: NEGATIVE

## 2014-07-18 MED ORDER — ALBUTEROL SULFATE (2.5 MG/3ML) 0.083% IN NEBU: 2.5000 mg | INHALATION_SOLUTION | RESPIRATORY_TRACT | Status: DC | PRN

## 2014-07-18 MED ORDER — IPRATROPIUM-ALBUTEROL 0.5-2.5 (3) MG/3ML IN SOLN
3.0000 mL | Freq: Four times a day (QID) | RESPIRATORY_TRACT | Status: DC
  Administered 2014-07-18 – 2014-07-19 (×5): 3 mL via RESPIRATORY_TRACT
  Filled 2014-07-18 (×5): qty 3

## 2014-07-18 MED ORDER — INSULIN ASPART 100 UNIT/ML ~~LOC~~ SOLN
2.0000 [IU] | SUBCUTANEOUS | Status: DC
  Administered 2014-07-18: 4 [IU] via SUBCUTANEOUS
  Administered 2014-07-18: 2 [IU] via SUBCUTANEOUS
  Administered 2014-07-19: 4 [IU] via SUBCUTANEOUS
  Administered 2014-07-19: 2 [IU] via SUBCUTANEOUS

## 2014-07-18 MED ORDER — METHYLPREDNISOLONE SODIUM SUCC 125 MG IJ SOLR
60.0000 mg | Freq: Two times a day (BID) | INTRAMUSCULAR | Status: DC
  Administered 2014-07-18 – 2014-07-19 (×2): 60 mg via INTRAVENOUS
  Filled 2014-07-18 (×4): qty 0.96

## 2014-07-18 MED ORDER — ALPRAZOLAM 0.5 MG PO TABS
0.5000 mg | ORAL_TABLET | Freq: Three times a day (TID) | ORAL | Status: DC | PRN
  Administered 2014-07-19: 0.5 mg via ORAL
  Filled 2014-07-18: qty 1

## 2014-07-18 MED ORDER — SODIUM CHLORIDE 0.9 % IV SOLN
INTRAVENOUS | Status: DC
  Administered 2014-07-18 – 2014-07-19 (×2): via INTRAVENOUS

## 2014-07-18 NOTE — Progress Notes (Signed)
CRITICAL VALUE ALERT  Critical value received:  Lactic 2.2  Date of notification:  07/18/14  Time of notification:  1107  Critical value read back:Yes.    Nurse who received alert:  Rosine Door  MD notified (1st page):  Dr. Nelda Marseille  Time of first page:  1107  MD notified (2nd page):  Time of second page:  Responding MD:  Dr. Nelda Marseille  Time MD responded:  1109

## 2014-07-18 NOTE — Progress Notes (Addendum)
[facility] PROGRESS NOTE  [name] ZHG:[id number] DOB: Oct 08, 1960 DOA: 07/17/2014 PCP: Walker Kehr, MD  Assessment/Plan: 1-Acute Hypoxic Respiratory Failure;  In setting OSA, Probably COPD, Influenza.  Transfer to step down.  Repeat Chest x ray , ABG.  CCM consulted.  Continue with nebulizer treatments, IV solumedrol, IV Levaquin, Tamiflu.  BNP yesterday was at 12.  Follow blood culture.   2-OSA; CPAP.   3-HTN; hold BP medications.   4-Dyslipidemia -Continue Lipitor  5-Morbid obesity -Patient currently endorses has intentionally lost 20 pounds  6-Anxiety state -Continue home Xanax  7-GERD -not on Medications.  8-Tobacco dependence -Patient declined nicotine patch  Code Status: Full Code.  Family Communication: Care discussed with patient.  Disposition Plan: Transfer to step down/ICU   Consultants:  CCM  Procedures:  none  Antibiotics:  Levaquin.   Tamiflu.   HPI/Subjective: Alert, speaking full sentences.  Hypoxic in the 90 on 50% venturi mask.   Objective: Filed Vitals:   07/18/14 0500  BP:   Pulse:   Temp:   Resp: 37    Intake/Output Summary (Last 24 hours) at 07/18/14 0807 Last data filed at 07/17/14 1700  Gross per 24 hour  Intake    120 ml  Output      0 ml  Net    120 ml   Filed Weights   07/17/14 1500  Weight: 136 kg (299 lb 13.2 oz)    Exam:   General:  Alert, mild distress.   Cardiovascular: S 1, S 2 RRR  Respiratory: decreases breath sounds.   Abdomen: BS present, soft, NT, obese.   Musculoskeletal: Trace edema.    Data Reviewed: Basic Metabolic Panel:  Recent Labs Lab 07/17/14 0725 07/17/14 1356 07/18/14 0432  NA 135  --  139  K 4.3  --  4.5  CL 108  --  107  CO2 26  --  26  GLUCOSE 118*  --  166*  BUN 18  --  18  CREATININE 0.79 1.06 0.94  CALCIUM 8.7  --  8.9   Liver Function Tests:  Recent Labs Lab 07/17/14 0725  AST 27  ALT 31  ALKPHOS 60  BILITOT 2.0*  PROT  7.4  ALBUMIN 4.5   No results for input(s): LIPASE, AMYLASE in the last 168 hours. No results for input(s): AMMONIA in the last 168 hours. CBC:  Recent Labs Lab 07/17/14 0725 07/17/14 1356 07/18/14 0432  WBC 9.9 7.7 12.2*  NEUTROABS 7.4  --   --   HGB 15.6 15.5 15.0  HCT 45.1 45.8 44.3  MCV 90.4 92.2 91.0  PLT 123* 117* 145*   Cardiac Enzymes:  Recent Labs Lab 07/17/14 0725  TROPONINI <0.03   BNP (last 3 results)  Recent Labs  07/17/14 0725  BNP 12.5    ProBNP (last 3 results) No results for input(s): PROBNP in the last 8760 hours.  CBG: No results for input(s): GLUCAP in the last 168 hours.  No results found for this or any previous visit (from the past 240 hour(s)).   Studies: Dg Chest Port 1 View  07/17/2014   CLINICAL DATA:  Severe short of breath. Cough this morning. Cigarette smoker.  shortness of breath  EXAM: PORTABLE CHEST - 1 VIEW  COMPARISON:  10/12/2013.  FINDINGS: The cardiopericardial silhouette is enlarged. Lung volumes are lower than on prior. There is pulmonary vascular congestion. Respiratory motion is present on the exam, degrading evaluation of edema. No gross airspace consolidation. Panniculus projects over the inferior chest.  Monitoring leads project over the chest.  IMPRESSION: Cardiomegaly and pulmonary vascular congestion suggesting mild CHF.   Electronically Signed   By: Dereck Ligas M.D.   On: 07/17/2014 07:49    Scheduled Meds: . antiseptic oral rinse  7 mL Mouth Rinse BID  . aspirin  81 mg Oral Daily  . atorvastatin  20 mg Oral QPC supper  . benzonatate  200 mg Oral TID  . budesonide (PULMICORT) nebulizer solution  0.25 mg Nebulization BID  . guaiFENesin  600 mg Oral BID  . heparin  5,000 Units Subcutaneous 3 times per day  . ipratropium-albuterol  3 mL Nebulization QID  . isosorbide mononitrate  60 mg Oral Daily  . levofloxacin (LEVAQUIN) IV  750 mg Intravenous Q24H  . losartan  100 mg Oral Daily  . methylPREDNISolone  (SOLU-MEDROL) injection  60 mg Intravenous 3 times per day  . oseltamivir  75 mg Oral BID  . traMADol  50 mg Oral q morning - 10a   Continuous Infusions:   Active Problems:   Dyslipidemia   Morbid obesity   Anxiety state   HTN (hypertension)   MYOCARDIAL INFARCTION, HX OF   GERD   COPD exacerbation   Acute respiratory failure with hypoxia   Pneumonitis   OSA treated with BiPAP   Tobacco dependence    Time spent: 35 minutes.     Niel Hummer A  [facility] Pager [phone]. If 7PM-7AM, please contact night-coverage at [email or web address], password [facility] 07/18/2014, 8:07 AM  LOS: 1 day    Transfer to ICU, CCM primary.

## 2014-07-18 NOTE — Progress Notes (Signed)
PATIENT CONTINUOUSLY SATTING IN MID TO UPPER 80'S WITH A RESPIRATORY RATE IN THE 40'S. PATIENT GIVEN PRN XANAX AND ALBUTEROL TREATMENT.  PATIENT CURRENTLY OUT OF BED TO CHAIR.

## 2014-07-18 NOTE — Consult Note (Signed)
PULMONARY / CRITICAL CARE MEDICINE   Name: [name] MRN: [id number] DOB: 1960-05-23    ADMISSION DATE:  07/17/2014 CONSULTATION DATE:  07/18/14  REFERRING MD :  Dr. Tyrell Antonio / [facility]   CHIEF COMPLAINT:  Respiratory Distress   INITIAL PRESENTATION: 54 y/o M, smoker, admitted 3/6 with hypoxic respiratory failure and found to be H1N1 / Flu A positive.  Developed progressive respiratory failure early am 3/7.  PCCM called for evaluation.    STUDIES:  3/07  ECHO >>   SIGNIFICANT EVENTS: 3/06  Admit with hypoxic respiratory failure, H1N1/Influenza A positive   HISTORY OF PRESENT ILLNESS: 54 y/o M, smoker (2 pack per day 40 years), ETOH use (12 drinks per week), with a past medical history of anxiety, coronary artery disease s/p LHC, hyperlipidemia, chronic low back pain, history of cocaine abuse, multiple orthopedic fractures, diabetes mellitus, suspected undiagnosed COPD and obstructive sleep apnea on auto titration BiPAP who presented to [facility] ER on 3/6 with a 24 hour history of increasing shortness of breath, fevers/chills, burning in his chest, productive cough with yellow/brown sputum and fatigue.  He was treated with albuterol, steroids and oxygen in the ER.  Initial chest x-ray was suggestive of cardiomegaly with pulmonary vascular congestion. Influenza PCR was positive for H1 N1 & flu A.  He was admitted per [facility] to the medical floor. Early a.m. of 3/7 patient developed worsening hypoxemia requiring Venturi mask & tachypnea. He was medicated with Xanax &  given a breathing treatment without improvement.  AM recent 7 ABG was assessed with ph 7.36 / 44 / 68 / 24.  RRT was called to assess patient with hypotension and change in mental status.  PCCM was called for ICU transfer.    PAST MEDICAL HISTORY :   has a past medical history of Anxiety; GERD (gastroesophageal reflux disease); Hyperlipidemia; Diabetes mellitus; Myocardial infarction ("distant past"); CAD (coronary artery  disease); OSA (obstructive sleep apnea); Pain, low back; Tobacco use disorder; Cocaine abuse; and Obesity, unspecified.  has past surgical history that includes Fracture surgery; Fracture surgery; Back surgery; and left heart catheterization with coronary angiogram (N/A, 11/22/2013).   HOME MEDICATIONS:  Prior to Admission medications   Medication Sig Start Date End Date Taking? Authorizing Provider  alprazolam Duanne Moron) 2 MG tablet TAKE 1 TABLET BY MOUTH THREE TIMES DAILY AS NEEDED FOR ANXIETY 07/07/14  Yes Janith Lima, MD  aspirin EC 81 MG tablet Take 1 tablet (81 mg total) by mouth daily. 02/05/12  Yes Larey Dresser, MD  atorvastatin (LIPITOR) 20 MG tablet Take 1 tablet (20 mg total) by mouth daily. 06/13/14  Yes Aleksei Plotnikov V, MD  isosorbide mononitrate (IMDUR) 60 MG 24 hr tablet Take 1 tablet (60 mg total) by mouth daily. 06/13/14  Yes Aleksei Plotnikov V, MD  losartan (COZAAR) 100 MG tablet Take 1 tablet (100 mg total) by mouth daily. 06/13/14 06/13/15 Yes Aleksei Plotnikov V, MD  nitroGLYCERIN (NITROSTAT) 0.4 MG SL tablet Place 1 tablet (0.4 mg total) under the tongue every 5 (five) minutes as needed. 06/13/14  Yes Aleksei Plotnikov V, MD  traMADol (ULTRAM) 50 MG tablet Take 1 tablet (50 mg total) by mouth every morning. 06/13/14  Yes Aleksei Plotnikov V, MD  naproxen (NAPROSYN) 500 MG tablet TAKE 1 TABLET BY MOUTH DAILY AS NEEDED 06/13/14   Cassandria Anger, MD   Allergies  Allergen Reactions  . Ace Inhibitors   . Lisinopril     cough    FAMILY HISTORY:  @  FAMSTP(<SUBSCRIPT> error)@  SOCIAL HISTORY:  reports that he has been smoking Cigarettes.  He has a 37.5 pack-year smoking history. He has never used smokeless tobacco. He reports that he drinks about 6.0 oz of alcohol per week. He reports that he uses illicit drugs (Marijuana).  REVIEW OF SYSTEMS:   Gen: Denies weight change, night sweats.  Reports  fever, chills, fatigue HEENT: Denies blurred vision, double vision, hearing loss,  tinnitus, sinus congestion, rhinorrhea, sore throat, neck stiffness, dysphagia PULM: Denies hemoptysis, wheezing.  Reports shortness of breath, cough, sputum production, and chest burning CV: Denies chest pain, edema, orthopnea, paroxysmal nocturnal dyspnea, palpitations GI: Denies abdominal pain, nausea, vomiting, diarrhea, hematochezia, melena, constipation, change in bowel habits GU: Denies dysuria, hematuria, polyuria, oliguria, urethral discharge Endocrine: Denies hot or cold intolerance, polyuria, polyphagia or appetite change Derm: Denies rash, dry skin, scaling or peeling skin change Heme: Denies easy bruising, bleeding, bleeding gums Neuro: Denies headache, numbness, weakness, slurred speech, loss of memory or consciousness   SUBJECTIVE:   VITAL SIGNS: Temp:  [97.5 F (36.4 C)-98.1 F (36.7 C)] 98.1 F (36.7 C) (03/06 2055) Pulse Rate:  [108-123] 110 (03/07 0900) Resp:  [22-40] 32 (03/07 0900) BP: (90-144)/(53-77) 100/58 mmHg (03/07 0900) SpO2:  [84 %-95 %] 95 % (03/07 0900) FiO2 (%):  [40 %] 40 % (03/07 0930) Weight:  [299 lb 13.2 oz (136 kg)] 299 lb 13.2 oz (136 kg) (03/06 1500)   HEMODYNAMICS:     VENTILATOR SETTINGS: Vent Mode:  [-] BIPAP FiO2 (%):  [40 %] 40 % Set Rate:  [8 bmp] 8 bmp PEEP:  [6 cmH20] 6 cmH20   INTAKE / OUTPUT:  Intake/Output Summary (Last 24 hours) at 07/18/14 0951 Last data filed at 07/17/14 1700  Gross per 24 hour  Intake    120 ml  Output      0 ml  Net    120 ml    PHYSICAL EXAMINATION: General:  Obese male appears acutely ill  Neuro:  Drowsy, arouses to voice, appropriate, MAE, speech clear when awake HEENT:  MM pink/moist, no jvd, Mallampati 3/4 Cardiovascular:  s1s2 rrr, tachy, no m/r/g Lungs:  Tachypnea, lungs bilaterally distant, diminished lower lobes  Abdomen:  Obese, soft, bsx4 hypoactive  Musculoskeletal:  No acute deformities, multiple ortho scars (well healed) Skin:  Warm/dry, no edema   LABS:  CBC  Recent  Labs Lab 07/17/14 0725 07/17/14 1356 07/18/14 0432  WBC 9.9 7.7 12.2*  HGB 15.6 15.5 15.0  HCT 45.1 45.8 44.3  PLT 123* 117* 145*   Coag's No results for input(s): APTT, INR in the last 168 hours. BMET  Recent Labs Lab 07/17/14 0725 07/17/14 1356 07/18/14 0432  NA 135  --  139  K 4.3  --  4.5  CL 108  --  107  CO2 26  --  26  BUN 18  --  18  CREATININE 0.79 1.06 0.94  GLUCOSE 118*  --  166*   Electrolytes  Recent Labs Lab 07/17/14 0725 07/18/14 0432  CALCIUM 8.7 8.9   Sepsis Markers No results for input(s): LATICACIDVEN, PROCALCITON, O2SATVEN in the last 168 hours.   ABG  Recent Labs Lab 07/18/14 0825  PHART 7.364  PCO2ART 44.7  PO2ART 68.9*   Liver Enzymes  Recent Labs Lab 07/17/14 0725  AST 27  ALT 31  ALKPHOS 60  BILITOT 2.0*  ALBUMIN 4.5   Cardiac Enzymes  Recent Labs Lab 07/17/14 0725  TROPONINI <0.03   Glucose No results for  input(s): GLUCAP in the last 168 hours.  Imaging Dg Chest Port 1 View  07/17/2014   CLINICAL DATA:  Severe short of breath. Cough this morning. Cigarette smoker.  shortness of breath  EXAM: PORTABLE CHEST - 1 VIEW  COMPARISON:  10/12/2013.  FINDINGS: The cardiopericardial silhouette is enlarged. Lung volumes are lower than on prior. There is pulmonary vascular congestion. Respiratory motion is present on the exam, degrading evaluation of edema. No gross airspace consolidation. Panniculus projects over the inferior chest. Monitoring leads project over the chest.  IMPRESSION: Cardiomegaly and pulmonary vascular congestion suggesting mild CHF.   Electronically Signed   By: Dereck Ligas M.D.   On: 07/17/2014 07:49     ASSESSMENT / PLAN:  PULMONARY OETT A: Acute Hypoxic Respiratory Failure - in setting of influenza  ARDS - Pa/FiO2 ratio compatible with moderate ARDS Possible AECOPD  OSA - on BiPAP at baseline Tobacco Abuse - 2ppd hx P:   Tx to ICU for close observation for intubation Now BiPAP Intubate if  further decline in mental status or further increase in O2 needs See ID  Duoneb + PRN albuterol  Droplet precautions  Trend CXR for post viral development of infiltrate Continue solu-medrol for now  CARDIOVASCULAR CVL A:  Transient Hypotension - 3/7 am, resolved.  ? Related to xanax + volume depletion  P:  Assess lactic acid, troponin (neg) NS @ 50 ml/hr Hold home imdur, cozaar Continue ASA   RENAL A:   No acute issues  P:   Trend chemistry  Replace electrolytes as indicated   GASTROINTESTINAL A:   Obesity  P:   NPO for now PPI if intubated   HEMATOLOGIC A:   Thrombocytopenia - in setting of acute viral illness P:  Monitor CBC DVT:  Heparin sq  INFECTIOUS A:   Influenza Positive - H1N1 + A  P:   BCx2 3/6 >> UC 3/6 >>  Sputum 3/6 >>   Levaquin, start date 3/6, day 2/x Tamiflu, start date 3/6, day2/5  Follow cultures as above  ENDOCRINE A:   ? DM - not on home meds & pt denies.  On steroids P:   Assess A1c SSI if consistently > 180  NEUROLOGIC A:   Acute Metabolic Encephalopathy - in setting of viral illness + sedating medication Hx Cocaine Abuse, ETOH P:   RASS goal: n/a Reduce xanax dosing to 0.5 mg TID PRN Minimize sedation as able  Monitor for ETOH withdrawal sx  FAMILY  - Updates: Wife and sister updated at length bedside.  Full code status.  Noe Gens, NP-C [facility] Pgr: [phone] or [phone]  Transfer to the ICU for monitoring.  Start BiPAP.  Continue levofloxacin and tamiflu.  Steroids as ordered.  Bronchodilators.  If worsens then will intubate.  Titrate O2 for sats.  The patient is critically ill with multiple organ systems failure and requires high complexity decision making for assessment and support, frequent evaluation and titration of therapies, application of advanced monitoring technologies and extensive interpretation of multiple databases.   Critical Care Time devoted to patient care services  described in this note is  35  Minutes. This time reflects time of care of this signee Dr Jennet Maduro. This critical care time does not reflect procedure time, or teaching time or supervisory time of PA/NP/Med student/Med Resident etc but could involve care discussion time.  Rush Farmer, M.D. [facility] Pulmonary/Critical Care Medicine. Pager: [phone]. After hours pager: [phone].  07/18/2014, 9:51 AM

## 2014-07-18 NOTE — Progress Notes (Signed)
Transported pt on BIPAP to 2M07 with Rapid Response team with no complications. Remains on BIPAP. Will continue to monitor.

## 2014-07-18 NOTE — Progress Notes (Signed)
Utilization review completed. Ashley Bultema, RN, BSN. 

## 2014-07-18 NOTE — Progress Notes (Signed)
Spoke with Dr. Nelda Marseille. He is ok with patient taking PO meds while on Bipap per the order

## 2014-07-18 NOTE — Significant Event (Signed)
Rapid Response Event Note  Overview:  Asked by attending to assist with patient with decreased O2sats Time Called: 0811 Arrival Time: 0816 Event Type: Respiratory  Initial Focused Assessment:  On arrival patient sitting in chair.  Skin warm and moist - alert speaking in full sentences - RR 32-36 NAD - denies SOB or CP just feel "bad" all over.  Just finished neb tx - RT Jennifer at bedside.  On 40% Vm  O2 sats 90-02%.  Abdominal breathing - large body habitus - large abdominal hernia noted with coughing - patient reports not new.  Patient states he sleeps on Bipap at home for OSA - could not tolerate the hospital mask last night - did not sleep on Bipap.  Bil BS very distant  - no overt wheezing noted - good strong cough - moderate amount frothy white mostly with little yellow tinge.  No JVD noted - no LE edema.  NSL left wrist - flushes well.  Oriented and appropriate.  ST on monitor 117 BP 100/58.  Dr. Tyrell Antonio at bedside.    Interventions:  Stat PCXR and ABG done.  Patient made NPO - ate full breakfast at 0730.  Continue to monitor patient - awaiting bed transfer.  Labs drawn.  Placed back in bed - moves well - became increasingly SOB and increased tachynpnea with activity - patient denies feeling SOB but obvious increased in WOB - O2 sats 88-92% on VM.   After 15 mins in bed patient noticed to have decreased LOC - still arouses with name but falls back to sleep and snoring resps - RR remains about 36.  When aroused patient states he is tired.  Wife at bedside - updates given and questions answered.   RT called for BIpap machine.  Sallye Ober NP with PCCM present - update given - she requests Bipap to be started.  Placed on Bipap per RT - patient tol well.  Patient turned himself on right side = BP dropped to 77/48 HR 117 RR 32 on Bipap - turned to back - repeat BP 82/42 - started NS infusion - lungs clear upper lobes -decreased LL - neuro status remains lethargic but arousable and oriented.  Report  called to 61M per Angie RN - transported patient on Bipap with monitors without incident.  Handoff to [facility] - updates given.  125cc total NS infused.  See flowsheet for detailed VS.     Event Summary: Name of Physician Notified: Dr. Tyrell Antonio at  (pta RRT)  Name of Consulting Physician Notified: PCCM at  (by Dr. Tyrell Antonio)  Outcome:  (room changed to 61M07)  Event End Time: 1030  Quin Hoop

## 2014-07-18 NOTE — Progress Notes (Signed)
Placed pt on cpap for the night with home mask, pt is tolerating well at this time

## 2014-07-18 NOTE — Progress Notes (Signed)
Called by Rapid Response to place pt on BIPAP. Pt sleepy and RR in high 30's. Placed on 14/6 due to low volumes. Awaiting transfer orders.

## 2014-07-19 ENCOUNTER — Inpatient Hospital Stay (HOSPITAL_COMMUNITY): Payer: 59

## 2014-07-19 DIAGNOSIS — J1189 Influenza due to unidentified influenza virus with other manifestations: Secondary | ICD-10-CM

## 2014-07-19 DIAGNOSIS — I509 Heart failure, unspecified: Secondary | ICD-10-CM

## 2014-07-19 LAB — GLUCOSE, CAPILLARY
Glucose-Capillary: 108 mg/dL — ABNORMAL HIGH (ref 70–99)
Glucose-Capillary: 127 mg/dL — ABNORMAL HIGH (ref 70–99)
Glucose-Capillary: 151 mg/dL — ABNORMAL HIGH (ref 70–99)

## 2014-07-19 LAB — CBC
HEMATOCRIT: 44.2 % (ref 39.0–52.0)
HEMOGLOBIN: 14.8 g/dL (ref 13.0–17.0)
MCH: 30.8 pg (ref 26.0–34.0)
MCHC: 33.5 g/dL (ref 30.0–36.0)
MCV: 91.9 fL (ref 78.0–100.0)
Platelets: 161 10*3/uL (ref 150–400)
RBC: 4.81 MIL/uL (ref 4.22–5.81)
RDW: 14.2 % (ref 11.5–15.5)
WBC: 14.2 10*3/uL — ABNORMAL HIGH (ref 4.0–10.5)

## 2014-07-19 LAB — BASIC METABOLIC PANEL
Anion gap: 8 (ref 5–15)
BUN: 24 mg/dL — ABNORMAL HIGH (ref 6–23)
CHLORIDE: 107 mmol/L (ref 96–112)
CO2: 25 mmol/L (ref 19–32)
Calcium: 9 mg/dL (ref 8.4–10.5)
Creatinine, Ser: 0.75 mg/dL (ref 0.50–1.35)
GFR calc non Af Amer: >90 mL/min (ref >90)
Glucose, Bld: 132 mg/dL — ABNORMAL HIGH (ref 70–99)
POTASSIUM: 5.4 mmol/L — AB (ref 3.5–5.1)
SODIUM: 140 mmol/L (ref 135–145)

## 2014-07-19 LAB — HEMOGLOBIN A1C
HEMOGLOBIN A1C: 5.7 % — AB (ref 4.8–5.6)
MEAN PLASMA GLUCOSE: 117 mg/dL

## 2014-07-19 LAB — MAGNESIUM: MAGNESIUM: 2.4 mg/dL (ref 1.5–2.5)

## 2014-07-19 LAB — HIV ANTIBODY (ROUTINE TESTING W REFLEX): HIV Screen 4th Generation wRfx: NONREACTIVE

## 2014-07-19 LAB — PHOSPHORUS: PHOSPHORUS: 3.8 mg/dL (ref 2.3–4.6)

## 2014-07-19 MED ORDER — ALPRAZOLAM 0.5 MG PO TABS
0.5000 mg | ORAL_TABLET | Freq: Three times a day (TID) | ORAL | Status: DC | PRN
  Administered 2014-07-19: 0.5 mg via ORAL
  Filled 2014-07-19: qty 1

## 2014-07-19 MED ORDER — GUAIFENESIN ER 600 MG PO TB12
600.0000 mg | ORAL_TABLET | Freq: Two times a day (BID) | ORAL | Status: DC | PRN
  Filled 2014-07-19: qty 1

## 2014-07-19 MED ORDER — ALBUTEROL SULFATE (2.5 MG/3ML) 0.083% IN NEBU: 2.5000 mg | INHALATION_SOLUTION | RESPIRATORY_TRACT | Status: DC | PRN

## 2014-07-19 MED ORDER — PREDNISONE 20 MG PO TABS
30.0000 mg | ORAL_TABLET | Freq: Every day | ORAL | Status: DC
  Administered 2014-07-19 – 2014-07-20 (×2): 30 mg via ORAL
  Filled 2014-07-19 (×3): qty 1

## 2014-07-19 MED ORDER — BENZONATATE 100 MG PO CAPS
200.0000 mg | ORAL_CAPSULE | Freq: Three times a day (TID) | ORAL | Status: DC | PRN
  Filled 2014-07-19: qty 2

## 2014-07-19 NOTE — Progress Notes (Signed)
PULMONARY / CRITICAL CARE MEDICINE   Name: [name] MRN: [id number] DOB: [birth year]    ADMISSION DATE:  07/17/2014 CONSULTATION DATE:  07/18/14  REFERRING MD :  Dr. Tyrell Antonio / [facility]   CHIEF COMPLAINT:  Respiratory Distress   INITIAL PRESENTATION:  54 yo male smoker presented with dyspnea, non productive cough, and fever from Influenza A/H1N1.  Transferred to ICU with respiratory distress 3/07.  STUDIES:   SIGNIFICANT EVENTS: 3/06 Admit with hypoxic respiratory failure, H1N1/Influenza A positive 3/07 To ICU 3/08 to flr bed  SUBJECTIVE:  Feels much better.  Dry cough.  Denies sinus congestion, sore throat.  Feels hungry.  VITAL SIGNS: Temp:  [97.5 F (36.4 C)-98.7 F (37.1 C)] 98.3 F (36.8 C) (03/08 0802) Pulse Rate:  [83-110] 92 (03/08 0900) Resp:  [24-35] 32 (03/08 0900) BP: (77-116)/(39-72) 108/54 mmHg (03/08 0900) SpO2:  [90 %-97 %] 94 % (03/08 0900) FiO2 (%):  [40 %-50 %] 50 % (03/08 0900) Weight:  [291 lb 0.1 oz (132 kg)] 291 lb 0.1 oz (132 kg) (03/08 0500)   INTAKE / OUTPUT:  Intake/Output Summary (Last 24 hours) at 07/19/14 1007 Last data filed at 07/19/14 0900  Gross per 24 hour  Intake 1525.83 ml  Output   1800 ml  Net -274.17 ml    PHYSICAL EXAMINATION: General: no distress, sitting in chair Neuro: normal strength HEENT: no sinus tenderness Cardiovascular: regular Lungs: no wheeze Abdomen:  Obese, soft Musculoskeletal:  No edema Skin: multiple tattoos  LABS:  CBC  Recent Labs Lab 07/17/14 1356 07/18/14 0432 07/19/14 0230  WBC 7.7 12.2* 14.2*  HGB 15.5 15.0 14.8  HCT 45.8 44.3 44.2  PLT 117* 145* 161   BMET  Recent Labs Lab 07/17/14 0725 07/17/14 1356 07/18/14 0432 07/19/14 0230  NA 135  --  139 140  K 4.3  --  4.5 5.4*  CL 108  --  107 107  CO2 26  --  26 25  BUN 18  --  18 24*  CREATININE 0.79 1.06 0.94 0.75  GLUCOSE 118*  --  166* 132*   Electrolytes  Recent Labs Lab 07/17/14 0725 07/18/14 0432  07/19/14 0230  CALCIUM 8.7 8.9 9.0  MG  --   --  2.4  PHOS  --   --  3.8   Sepsis Markers  Recent Labs Lab 07/18/14 0957 07/18/14 1218  LATICACIDVEN 2.2* 1.5     ABG  Recent Labs Lab 07/18/14 0825  PHART 7.364  PCO2ART 44.7  PO2ART 68.9*   Liver Enzymes  Recent Labs Lab 07/17/14 0725  AST 27  ALT 31  ALKPHOS 60  BILITOT 2.0*  ALBUMIN 4.5   Cardiac Enzymes  Recent Labs Lab 07/17/14 0725  TROPONINI <0.03   Glucose  Recent Labs Lab 07/18/14 1157 07/18/14 1545 07/18/14 1954 07/19/14 0006 07/19/14 0437 07/19/14 0758  GLUCAP 177* 136* 128* 151* 127* 108*    Imaging Dg Chest Port 1 View  07/18/2014   CLINICAL DATA:  Hypoxemia  EXAM: PORTABLE CHEST - 1 VIEW  COMPARISON:  07/17/2014  FINDINGS: Cardiomegaly again noted. Persistent mild interstitial prominence bilaterally suspicious for mild interstitial edema. No segmental infiltrate.  IMPRESSION: Mild interstitial prominence bilaterally suspicious for mild interstitial edema. No segmental infiltrate.   Electronically Signed   By: Lahoma Crocker M.D.   On: 07/18/2014 08:42     ASSESSMENT / PLAN:  Acute hypoxic respiratory failure 2nd to influenza. Acute asthmatic bronchitis. Tobacco abuse. Hx of OSA. Plan: - f/u CXR intermittently -  day 3 of levaquin, tamiflu - oxygen to keep SpO2 > 92% >> might need to set up home oxygen  - continue CPAP qhs - smoking cessation - change solumedrol to prednisone 30 mg daily on 3/08 >> wean off as tolerated - will need outpt PFTs when more stable - change BD's to prn  Hypotension 2nd to hypovolemia >> improved. Hx of CAD, HLD. Plan: - continue ASA, lipitor - continue IV fluids - hold cozaar, imdur  Anxiety, hx of ETOH. Plan: - prn xanax  Updated pt's wife at bedside.  Transfer to non tele flr bed 3/08 >> keep on PCCM service.  Chesley Mires, MD [facility] Pulmonary/Critical Care 07/19/2014, 10:21 AM Pager:  [phone] After 3pm call: [phone]

## 2014-07-19 NOTE — Progress Notes (Signed)
Pt insisting on moving from bed to chair back to bed multiple times through night. Pt desats to 80s to low 90s with exertion on venti mask. This nurse tried to educate pt but pt refused education and instruction.

## 2014-07-19 NOTE — Progress Notes (Signed)
Called report to [facility] on 5W.  All questions answered.

## 2014-07-19 NOTE — Progress Notes (Signed)
Report received from [place] for patient to be transferred into 5w04. Patient currently on venturi mask. RN states that RT will assess patient using Nasal Cannula prior to transporting to 5W

## 2014-07-19 NOTE — Progress Notes (Signed)
Pt switched to Venturi mask per pt request and refusal of continuing on CPAP. Explained to pt the importance of wearing home CPAP by pt still declining. RT made aware.

## 2014-07-19 NOTE — Progress Notes (Signed)
Echocardiogram 2D Echocardiogram has been performed.  Alcee Sipos 07/19/2014, 9:29 AM

## 2014-07-20 ENCOUNTER — Telehealth: Payer: Self-pay | Admitting: Internal Medicine

## 2014-07-20 NOTE — Progress Notes (Signed)
Patient has signed the [facility] papers and been informed. Also, all questions answered. MD notified.

## 2014-07-20 NOTE — Telephone Encounter (Signed)
Pt came in stated that he was hospital for 2 days for flu and he left with out medical advice, pt stated that they didn't not give him the proper care and no once check on him for what he was in the hospital for that's why left. Pt was wondering if Dr. Camila Li can give him antibiotic and Theraflu that the doctor in the hospital give him (send to [place]). Please call pt

## 2014-07-20 NOTE — Care Management Note (Signed)
    Page 1 of 1   07/20/2014     10:37:18 AM CARE MANAGEMENT NOTE 07/20/2014  Patient:  [name], [name]   Account Number:  [id number]  Date Initiated:  07/19/2014  Documentation initiated by:  [facility]  Subjective/Objective Assessment:   Admitted with SOB - found to have H1N1.  Decompensated requiring bipap - tx to ICU.     Action/Plan:   Anticipated DC Date:  07/20/2014   Anticipated DC Plan:  [place]  CM consult      Choice offered to / List presented to:             Status of service:  Completed, signed off Medicare Important Message given?  NO (If response is "NO", the following Medicare IM given date fields will be blank) Date Medicare IM given:   Medicare IM given by:   Date Additional Medicare IM given:   Additional Medicare IM given by:    Discharge Disposition:  [place]  Per UR Regulation:  Reviewed for med. necessity/level of care/duration of stay  If discussed at [place] of Stay Meetings, dates discussed:    Comments:  Contact:  House,Stephanie Daughter [phone]     [place], Dob[place] [phone]   07/20/14 [place], BSN, [phone] patient left AMA.

## 2014-07-20 NOTE — Progress Notes (Signed)
Patient up to side of bed; alert and able to voice needs.  Conversed with this nurse the desire to go home tomorrow; informed there were no present notes anticipating discharge and that he would need to discuss with the MD in morning during their rounds.  Patient became upset and stated that he was going home tomorrow regardless.  Conversation diverted to a discussion of motorcycles and tattoos in which he willingly shared in.  Asked this nurse about assistance with CPAP; CPAP at bedside with this nurse offering to call respiratory to assist.  Stated that he did not need anyone to set it up, he just needed the machine turned on for him once in bed.  Informed to contact nurse when he was ready for mask to be placed on.  Call bell within reach.  Will continue to monitor.

## 2014-07-21 MED ORDER — OSELTAMIVIR PHOSPHATE 75 MG PO CAPS: 75.0000 mg | ORAL_CAPSULE | Freq: Two times a day (BID) | ORAL | Status: DC

## 2014-07-21 MED ORDER — LEVOFLOXACIN 500 MG PO TABS: 500.0000 mg | ORAL_TABLET | Freq: Every day | ORAL | Status: DC

## 2014-07-21 NOTE — Telephone Encounter (Signed)
Pt informed

## 2014-07-21 NOTE — Telephone Encounter (Signed)
OK - Rx emailed Thx 

## 2014-07-23 LAB — CULTURE, BLOOD (ROUTINE X 2)
CULTURE: NO GROWTH
Culture: NO GROWTH

## 2014-09-09 ENCOUNTER — Other Ambulatory Visit: Payer: Self-pay | Admitting: Internal Medicine

## 2014-09-15 ENCOUNTER — Encounter: Payer: Self-pay | Admitting: Internal Medicine

## 2014-09-15 ENCOUNTER — Ambulatory Visit (INDEPENDENT_AMBULATORY_CARE_PROVIDER_SITE_OTHER): Payer: 59 | Admitting: Internal Medicine

## 2014-09-15 VITALS — BP 130/80 | HR 96 | Wt 306.0 lb

## 2014-09-15 DIAGNOSIS — F17200 Nicotine dependence, unspecified, uncomplicated: Secondary | ICD-10-CM

## 2014-09-15 DIAGNOSIS — M545 Low back pain, unspecified: Secondary | ICD-10-CM

## 2014-09-15 DIAGNOSIS — I252 Old myocardial infarction: Secondary | ICD-10-CM

## 2014-09-15 DIAGNOSIS — I25119 Atherosclerotic heart disease of native coronary artery with unspecified angina pectoris: Secondary | ICD-10-CM | POA: Diagnosis not present

## 2014-09-15 DIAGNOSIS — M79605 Pain in left leg: Secondary | ICD-10-CM

## 2014-09-15 DIAGNOSIS — I10 Essential (primary) hypertension: Secondary | ICD-10-CM

## 2014-09-15 MED ORDER — TRAMADOL HCL 50 MG PO TABS: 50.0000 mg | ORAL_TABLET | ORAL | Status: DC

## 2014-09-15 MED ORDER — ALPRAZOLAM 2 MG PO TABS: 2.0000 mg | ORAL_TABLET | Freq: Three times a day (TID) | ORAL | Status: DC | PRN

## 2014-09-15 NOTE — Progress Notes (Signed)
Subjective:     HPI   F/u LBP is better F/u L testicle pain - better He hit his R wrist 2 wks ago     Addison got re-married in 8/14   F/u chronic CPs - recurrent, chronic SOB - smoking 2 ppd, gained wt again  The patient is here to follow up on chronic HTN, anxiety, elev glu and chronic moderate LBP symptoms controlled with medicines. His brother died of a MI at 80. F/u memory issues - some better Not loosing wt lately.   Review of Systems  Constitutional: Negative for appetite change, fatigue and unexpected weight change.  HENT: Negative for congestion, nosebleeds, sneezing, sore throat and trouble swallowing.   Eyes: Negative for itching and visual disturbance.  Respiratory: Negative for cough.   Cardiovascular: Negative for palpitations and leg swelling.  Gastrointestinal: Negative for nausea, diarrhea, blood in stool and abdominal distention.  Genitourinary: Negative for frequency and hematuria.  Musculoskeletal: Negative for joint swelling, gait problem and neck pain.  Skin: Negative for rash.  Neurological: Negative for dizziness, tremors and speech difficulty.  Psychiatric/Behavioral: Negative for suicidal ideas, sleep disturbance, dysphoric mood and agitation. The patient is not nervous/anxious.    Wt Readings from Last 3 Encounters:  09/15/14 306 lb (138.801 kg)  06/13/14 318 lb (144.244 kg)  03/11/14 312 lb (141.522 kg)   BP Readings from Last 3 Encounters:  09/15/14 130/80  06/13/14 152/84  03/11/14 142/70       Objective:   Physical Exam  Constitutional: He is oriented to person, place, and time. He appears well-developed. No distress.  obese  HENT:  Mouth/Throat: Oropharynx is clear and moist.  Eyes: Conjunctivae are normal. Pupils are equal, round, and reactive to light.  Neck: Normal range of motion. No JVD present. No thyromegaly present.  Cardiovascular: Normal rate, regular rhythm, normal heart sounds and intact distal pulses.  Exam reveals  no gallop and no friction rub.   No murmur heard. Pulmonary/Chest: Effort normal and breath sounds normal. No respiratory distress. He has no wheezes. He has no rales. He exhibits no tenderness.  Abdominal: Soft. Bowel sounds are normal. He exhibits no distension and no mass. There is no tenderness. There is no rebound and no guarding.  Musculoskeletal: Normal range of motion. He exhibits no edema or tenderness.  Lymphadenopathy:    He has no cervical adenopathy.  Neurological: He is alert and oriented to person, place, and time. He has normal reflexes. No cranial nerve deficit. He exhibits normal muscle tone. Coordination normal.  Skin: Skin is warm and dry. No rash noted.  Psychiatric: He has a normal mood and affect. His behavior is normal. Judgment and thought content normal.  LS is not tender R lat wrist hurts   Lab Results  Component Value Date   WBC 14.2* 07/19/2014   HGB 14.8 07/19/2014   HCT 44.2 07/19/2014   PLT 161 07/19/2014   GLUCOSE 132* 07/19/2014   CHOL 126 10/18/2013   TRIG 376.0* 10/18/2013   HDL 36.00* 10/18/2013   LDLDIRECT 48.0 05/28/2012   LDLCALC 15 10/18/2013   ALT 31 07/17/2014   AST 27 07/17/2014   NA 140 07/19/2014   K 5.4* 07/19/2014   CL 107 07/19/2014   CREATININE 0.75 07/19/2014   BUN 24* 07/19/2014   CO2 25 07/19/2014   TSH 2.08 05/28/2012   PSA 0.36 05/28/2012   INR 0.9 11/16/2013   HGBA1C 5.7* 07/18/2014    12/12/13 LS MRI FINDINGS:  Normal signal is  present in the conus medullaris which terminates at  T12-L1. Marrow signal and vertebral body heights are normal. There  is chronic loss of disc height at L4-5 and L5-S1.  Limited imaging the abdomen is unremarkable.  L1-2: Negative.  L2-3: A broad-based disc protrusion is present. Short pedicles are  evident. There is no significant focal stenosis.  L3-4: Mild broad-based disc protrusion and short pedicles results  and mild to moderate foraminal stenosis bilaterally, worse on the  left.   L4-5: A broad-based disc protrusion is more prominent on the right.  Short pedicles and mild facet hypertrophy are noted. Moderate right  foraminal stenosis is present. Mild subarticular narrowing is worse  on the left. Mild left foraminal stenosis is present.  L5-S1: A rightward disc protrusion is present. Moderate facet  hypertrophy is evident. This results in moderate right subarticular  stenosis and right greater than left foraminal narrowing.  IMPRESSION:  1. 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  2. Mild to moderate foraminal stenosis bilaterally at L3-4 is worse  on the left.  3. Moderate right and mild left foraminal stenosis at L4-5.  4. Mild subarticular stenosis at L4-5 is worse on the left.  5. Moderate right subarticular stenosis at L5-S1 with mild to  moderate foraminal narrowing, right greater than left.       Assessment & Plan:

## 2014-09-15 NOTE — Assessment & Plan Note (Signed)
On losartan BP Readings from Last 3 Encounters:  09/15/14 130/80  06/13/14 152/84  03/11/14 142/70

## 2014-09-15 NOTE — Assessment & Plan Note (Signed)
Lipitor, ASA, Imdur

## 2014-09-15 NOTE — Discharge Summary (Signed)
Physician Discharge Summary       Patient ID: [name] MRN: [id number] DOB/AGE: [birth year] 53 y.o.  Admit date: 07/17/2014 Discharge date: 09/15/2014  Discharge Diagnoses:  Acute hypoxic respiratory failure  Influenza (H1N1) Acute asthmatic bronchitis Tobacco abuse Obstructive Sleep Apnea  Hypotension  H/o CAD Anxiety Detailed Hospital Course:   54 y/o M, smoker (2 pack per day 40 years), ETOH use (12 drinks per week), with a past medical history of anxiety, coronary artery disease s/p LHC, hyperlipidemia, chronic low back pain, history of cocaine abuse, multiple orthopedic fractures, diabetes mellitus, suspected undiagnosed COPD and obstructive sleep apnea on auto titration BiPAP who presented to [facility] ER on 3/6 with a 24 hour history of increasing shortness of breath, fevers/chills, burning in his chest, productive cough with yellow/brown sputum and fatigue. He was treated with albuterol, steroids and oxygen in the ER. Initial chest x-ray was suggestive of cardiomegaly with pulmonary vascular congestion. Influenza PCR was positive for H1 N1 & flu A. He was admitted per [facility] to the medical floor. Early a.m. of 3/7 patient developed worsening hypoxemia requiring Venturi mask & tachypnea. He was medicated with Xanax & given a breathing treatment without improvement. AM recent 7 ABG was assessed with ph 7.36 / 44 / 68 / 24. RRT was called to assess patient with hypotension and change in mental status. PCCM was called for ICU transfer.   He was treated w/ IVFs supplemental oxygen and continued to be monitored. Fortunately did not require further escalation of care while in ICU. He was transferred to the med/surg floor on 3/8. Early in the morning he left AGAINST MEDICAL ADVICE on 3/9.     Discharge Plan by active problems  No discharge plan was reviewed w/ patient.     Significant Hospital tests/ studies  Consults:none    Discharge Exam: BP 132/59 mmHg   Pulse 81  Temp(Src) 97.9 F (36.6 C) (Oral)  Resp 18  Ht 6' (1.829 m)  Wt 135.626 kg (299 lb)  BMI 40.54 kg/m2  SpO2 91%  Left prior to am physical exam.   Labs at discharge Lab Results  Component Value Date   CREATININE 0.75 07/19/2014   BUN 24* 07/19/2014   NA 140 07/19/2014   K 5.4* 07/19/2014   CL 107 07/19/2014   CO2 25 07/19/2014   Lab Results  Component Value Date   WBC 14.2* 07/19/2014   HGB 14.8 07/19/2014   HCT 44.2 07/19/2014   MCV 91.9 07/19/2014   PLT 161 07/19/2014   Lab Results  Component Value Date   ALT 31 07/17/2014   AST 27 07/17/2014   ALKPHOS 60 07/17/2014   BILITOT 2.0* 07/17/2014   Lab Results  Component Value Date   INR 0.9 11/16/2013   INR 1.1* 02/06/2012   INR 1.0 ratio 01/11/2010    Current radiology studies No results found.  Disposition:  07-Left Against Medical Advice   Discharge Medication List was NOT REVIEWED as pt left AMA before critical care team had chance to review    Discharged Condition: left AMA   Physician Statement:   The Patient was personally examined, the discharge assessment and plan has been personally reviewed and I agree with ACNP Babcock's assessment and plan. > 30 minutes of time have been dedicated to discharge assessment, planning and discharge instructions.   SignedMarni Griffon 09/15/2014, 8:33 AM  Chesley Mires, MD [place] 09/15/2014, 3:33 PM Pager:  [phone] After 3pm call: [phone]

## 2014-09-15 NOTE — Assessment & Plan Note (Signed)
discussed

## 2014-09-15 NOTE — Assessment & Plan Note (Signed)
Chronic   Potential benefits of a short or long term opioids use as well as potential risks (i.e. addiction risk, apnea etc) and complications (i.e. Somnolence, constipation and others) were explained to the patient and were aknowledged. Tramadol prn

## 2014-09-15 NOTE — Progress Notes (Signed)
Pre visit review using our clinic review tool, if applicable. No additional management support is needed unless otherwise documented below in the visit note. 

## 2014-10-17 ENCOUNTER — Encounter: Payer: Self-pay | Admitting: Internal Medicine

## 2014-11-17 ENCOUNTER — Encounter: Payer: Self-pay | Admitting: Gastroenterology

## 2014-12-19 ENCOUNTER — Ambulatory Visit: Payer: 59 | Admitting: Internal Medicine

## 2015-01-18 ENCOUNTER — Encounter: Payer: Self-pay | Admitting: Internal Medicine

## 2015-01-18 ENCOUNTER — Other Ambulatory Visit (INDEPENDENT_AMBULATORY_CARE_PROVIDER_SITE_OTHER): Payer: 59

## 2015-01-18 ENCOUNTER — Telehealth: Payer: Self-pay | Admitting: *Deleted

## 2015-01-18 ENCOUNTER — Ambulatory Visit (INDEPENDENT_AMBULATORY_CARE_PROVIDER_SITE_OTHER): Payer: 59 | Admitting: Internal Medicine

## 2015-01-18 VITALS — BP 120/80 | HR 97 | Temp 98.1°F | Wt 312.0 lb

## 2015-01-18 DIAGNOSIS — F411 Generalized anxiety disorder: Secondary | ICD-10-CM

## 2015-01-18 DIAGNOSIS — I10 Essential (primary) hypertension: Secondary | ICD-10-CM

## 2015-01-18 DIAGNOSIS — R7309 Other abnormal glucose: Secondary | ICD-10-CM

## 2015-01-18 DIAGNOSIS — N410 Acute prostatitis: Secondary | ICD-10-CM

## 2015-01-18 HISTORY — DX: Acute prostatitis: N41.0

## 2015-01-18 LAB — BASIC METABOLIC PANEL
BUN: 16 mg/dL (ref 6–23)
CALCIUM: 9.4 mg/dL (ref 8.4–10.5)
CO2: 28 mEq/L (ref 19–32)
CREATININE: 0.81 mg/dL (ref 0.40–1.50)
Chloride: 106 mEq/L (ref 96–112)
GFR: 106.06 mL/min (ref >60.00)
GLUCOSE: 106 mg/dL — AB (ref 70–99)
Potassium: 4.4 mEq/L (ref 3.5–5.1)
Sodium: 141 mEq/L (ref 135–145)

## 2015-01-18 LAB — CBC WITH DIFFERENTIAL/PLATELET
Basophils Absolute: 0 10*3/uL (ref 0.0–0.1)
Basophils Relative: 0.3 % (ref 0.0–3.0)
EOS PCT: 3.2 % (ref 0.0–5.0)
Eosinophils Absolute: 0.2 10*3/uL (ref 0.0–0.7)
HEMATOCRIT: 46.9 % (ref 39.0–52.0)
HEMOGLOBIN: 16.2 g/dL (ref 13.0–17.0)
LYMPHS PCT: 30.4 % (ref 12.0–46.0)
Lymphs Abs: 2.3 10*3/uL (ref 0.7–4.0)
MCHC: 34.5 g/dL (ref 30.0–36.0)
MCV: 90.5 fl (ref 78.0–100.0)
Monocytes Absolute: 0.7 10*3/uL (ref 0.1–1.0)
Monocytes Relative: 9 % (ref 3.0–12.0)
NEUTROS ABS: 4.4 10*3/uL (ref 1.4–7.7)
Neutrophils Relative %: 57.1 % (ref 43.0–77.0)
PLATELETS: 171 10*3/uL (ref 150.0–400.0)
RBC: 5.18 Mil/uL (ref 4.22–5.81)
RDW: 13.1 % (ref 11.5–15.5)
WBC: 7.7 10*3/uL (ref 4.0–10.5)

## 2015-01-18 LAB — HEMOGLOBIN A1C: HEMOGLOBIN A1C: 5.8 % (ref 4.6–6.5)

## 2015-01-18 MED ORDER — NAPROXEN 500 MG PO TABS: ORAL_TABLET | ORAL | Status: DC

## 2015-01-18 MED ORDER — LEVOFLOXACIN 500 MG PO TABS: 500.0000 mg | ORAL_TABLET | Freq: Every day | ORAL | Status: DC

## 2015-01-18 MED ORDER — ALPRAZOLAM 2 MG PO TABS: 2.0000 mg | ORAL_TABLET | Freq: Three times a day (TID) | ORAL | Status: DC | PRN

## 2015-01-18 MED ORDER — TRAMADOL HCL 50 MG PO TABS: 50.0000 mg | ORAL_TABLET | Freq: Two times a day (BID) | ORAL | Status: DC | PRN

## 2015-01-18 NOTE — Assessment & Plan Note (Signed)
9/16 L pain - UA Levaquin x10 d

## 2015-01-18 NOTE — Assessment & Plan Note (Signed)
Labs

## 2015-01-18 NOTE — Telephone Encounter (Signed)
Noted. Thx.

## 2015-01-18 NOTE — Progress Notes (Signed)
Pre visit review using our clinic review tool, if applicable. No additional management support is needed unless otherwise documented below in the visit note. 

## 2015-01-18 NOTE — Progress Notes (Signed)
Subjective:  Patient ID: [name], male    DOB: Dec 07, 1960  Age: 54 y.o. MRN: [id number]  CC: No chief complaint on file.   HPI [name] presents for a lower abd pain, blood in urine, testicular pain. Bike ride has aggravated pain. C/o LBP. C/o fatigue x 1 week  Outpatient Prescriptions Prior to Visit  Medication Sig Dispense Refill  . aspirin EC 81 MG tablet Take 1 tablet (81 mg total) by mouth daily.    [name] atorvastatin (LIPITOR) 20 MG tablet TAKE 1 TABLET BY MOUTH DAILY 90 tablet 2  . losartan (COZAAR) 100 MG tablet Take 1 tablet (100 mg total) by mouth daily. 90 tablet 3  . nitroGLYCERIN (NITROSTAT) 0.4 MG SL tablet Place 1 tablet (0.4 mg total) under the tongue every 5 (five) minutes as needed. 20 tablet 3  . alprazolam (XANAX) 2 MG tablet Take 1 tablet (2 mg total) by mouth 3 (three) times daily as needed. for anxiety 90 tablet 2  . isosorbide mononitrate (IMDUR) 60 MG 24 hr tablet Take 1 tablet (60 mg total) by mouth daily. 90 tablet 3  . naproxen (NAPROSYN) 500 MG tablet TAKE 1 TABLET BY MOUTH DAILY AS NEEDED 90 tablet 1  . traMADol (ULTRAM) 50 MG tablet Take 1 tablet (50 mg total) by mouth every morning. 60 tablet 2   Facility-Administered Medications Prior to Visit  Medication Dose Route Frequency Provider Last Rate Last Dose  . albuterol (PROVENTIL) (2.5 MG/3ML) 0.083% nebulizer solution 2.5 mg  2.5 mg Nebulization Once Anyi Fels V, MD        ROS Review of Systems  Constitutional: Positive for fatigue. Negative for appetite change and unexpected weight change.  HENT: Negative for congestion, nosebleeds, sneezing, sore throat and trouble swallowing.   Eyes: Negative for itching and visual disturbance.  Respiratory: Negative for cough.   Cardiovascular: Negative for chest pain, palpitations and leg swelling.  Gastrointestinal: Positive for abdominal pain. Negative for nausea, diarrhea, blood in stool and abdominal distention.    Genitourinary: Negative for frequency and hematuria.  Musculoskeletal: Positive for back pain and arthralgias. Negative for joint swelling, gait problem and neck pain.  Skin: Negative for rash.  Neurological: Negative for dizziness, tremors, speech difficulty and weakness.  Psychiatric/Behavioral: Negative for suicidal ideas, sleep disturbance, dysphoric mood and agitation. The patient is not nervous/anxious.   lower pelvic pain  Objective:  BP 120/80 mmHg  Pulse 97  Temp(Src) 98.1 F (36.7 C) (Oral)  Wt 312 lb (141.522 kg)  SpO2 95%  BP Readings from Last 3 Encounters:  01/18/15 120/80  09/15/14 130/80  07/20/14 132/59    Wt Readings from Last 3 Encounters:  01/18/15 312 lb (141.522 kg)  09/15/14 306 lb (138.801 kg)  07/19/14 299 lb (135.626 kg)    Physical Exam  Constitutional: He is oriented to person, place, and time. He appears well-developed. No distress.  NAD  HENT:  Mouth/Throat: Oropharynx is clear and moist.  Eyes: Conjunctivae are normal. Pupils are equal, round, and reactive to light.  Neck: Normal range of motion. No JVD present.  Cardiovascular: Normal rate, regular rhythm, normal heart sounds and intact distal pulses.  Exam reveals no gallop and no friction rub.   No murmur heard. Pulmonary/Chest: Effort normal and breath sounds normal. No respiratory distress. He has no wheezes. He has no rales. He exhibits no tenderness.  Abdominal: Soft. Bowel sounds are normal. He exhibits no distension and no mass. There is no tenderness. There is no  rebound and no guarding.  Musculoskeletal: Normal range of motion. He exhibits no edema or tenderness.  Lymphadenopathy:    He has no cervical adenopathy.  Neurological: He is alert and oriented to person, place, and time. He has normal reflexes. No cranial nerve deficit. He exhibits normal muscle tone. He displays a negative Romberg sign. Coordination and gait normal.  Skin: Skin is warm and dry. No rash noted.   Psychiatric: He has a normal mood and affect. His behavior is normal. Thought content normal.  pt declined rectal exam  Lab Results  Component Value Date   WBC 7.7 01/18/2015   HGB 16.2 01/18/2015   HCT 46.9 01/18/2015   PLT 171.0 01/18/2015   GLUCOSE 106* 01/18/2015   CHOL 126 10/18/2013   TRIG 376.0* 10/18/2013   HDL 36.00* 10/18/2013   LDLDIRECT 48.0 05/28/2012   LDLCALC 15 10/18/2013   ALT 31 07/17/2014   AST 27 07/17/2014   NA 141 01/18/2015   K 4.4 01/18/2015   CL 106 01/18/2015   CREATININE 0.81 01/18/2015   BUN 16 01/18/2015   CO2 28 01/18/2015   TSH 2.08 05/28/2012   PSA 0.36 05/28/2012   INR 0.9 11/16/2013   HGBA1C 5.8 01/18/2015    Dg Chest Port 1 View  07/18/2014   CLINICAL DATA:  Hypoxemia  EXAM: PORTABLE CHEST - 1 VIEW  COMPARISON:  07/17/2014  FINDINGS: Cardiomegaly again noted. Persistent mild interstitial prominence bilaterally suspicious for mild interstitial edema. No segmental infiltrate.  IMPRESSION: Mild interstitial prominence bilaterally suspicious for mild interstitial edema. No segmental infiltrate.   Electronically Signed   By: Lahoma Crocker M.D.   On: 07/18/2014 08:42   Dg Chest Port 1 View  07/17/2014   CLINICAL DATA:  Severe short of breath. Cough this morning. Cigarette smoker.  shortness of breath  EXAM: PORTABLE CHEST - 1 VIEW  COMPARISON:  10/12/2013.  FINDINGS: The cardiopericardial silhouette is enlarged. Lung volumes are lower than on prior. There is pulmonary vascular congestion. Respiratory motion is present on the exam, degrading evaluation of edema. No gross airspace consolidation. Panniculus projects over the inferior chest. Monitoring leads project over the chest.  IMPRESSION: Cardiomegaly and pulmonary vascular congestion suggesting mild CHF.   Electronically Signed   By: Dereck Ligas M.D.   On: 07/17/2014 07:49    Assessment & Plan:   Diagnoses and all orders for this visit:  Prostatitis, acute -     CBC with Differential/Platelet;  Future -     Basic metabolic panel; Future -     Urinalysis; Future -     Hemoglobin A1c; Future  Essential hypertension -     CBC with Differential/Platelet; Future -     Basic metabolic panel; Future -     Urinalysis; Future -     Hemoglobin A1c; Future  Elevated glucose -     CBC with Differential/Platelet; Future -     Basic metabolic panel; Future -     Urinalysis; Future -     Hemoglobin A1c; Future  Other orders -     alprazolam (XANAX) 2 MG tablet; Take 1 tablet (2 mg total) by mouth 3 (three) times daily as needed. for anxiety -     naproxen (NAPROSYN) 500 MG tablet; TAKE 1 TABLET BY MOUTH DAILY AS NEEDED -     traMADol (ULTRAM) 50 MG tablet; Take 1 tablet (50 mg total) by mouth every 12 (twelve) hours as needed for severe pain. -     levofloxacin (LEVAQUIN)  500 MG tablet; Take 1 tablet (500 mg total) by mouth daily.  I have changed Mr. Holderman traMADol. I am also having him start on levofloxacin. Additionally, I am having him maintain his aspirin EC, losartan, nitroGLYCERIN, atorvastatin, isosorbide mononitrate, alprazolam, and naproxen. We will continue to administer albuterol.  Meds ordered this encounter  Medications  . isosorbide mononitrate (IMDUR) 30 MG 24 hr tablet    Sig: Take 1 tablet by mouth daily.    Refill:  3  . alprazolam (XANAX) 2 MG tablet    Sig: Take 1 tablet (2 mg total) by mouth 3 (three) times daily as needed. for anxiety    Dispense:  90 tablet    Refill:  2  . naproxen (NAPROSYN) 500 MG tablet    Sig: TAKE 1 TABLET BY MOUTH DAILY AS NEEDED    Dispense:  90 tablet    Refill:  2  . traMADol (ULTRAM) 50 MG tablet    Sig: Take 1 tablet (50 mg total) by mouth every 12 (twelve) hours as needed for severe pain.    Dispense:  60 tablet    Refill:  2  . levofloxacin (LEVAQUIN) 500 MG tablet    Sig: Take 1 tablet (500 mg total) by mouth daily.    Dispense:  10 tablet    Refill:  0     Follow-up: Return in about 3 months (around 04/19/2015)  for Wellness Exam.  Walker Kehr, MD

## 2015-01-18 NOTE — Patient Instructions (Signed)

## 2015-01-18 NOTE — Telephone Encounter (Signed)
Per [facility] lab- pt could not collect a UA and did not want to bring a specimen back so UA was cancelled.

## 2015-01-19 ENCOUNTER — Encounter: Payer: Self-pay | Admitting: Internal Medicine

## 2015-01-19 NOTE — Assessment & Plan Note (Signed)
Chronic Alprazolam prn  Potential benefits of a long term benzodiazepines  use as well as potential risks  and complications were explained to the patient and were aknowledged. 

## 2015-02-02 ENCOUNTER — Encounter: Payer: Self-pay | Admitting: Internal Medicine

## 2015-02-02 ENCOUNTER — Other Ambulatory Visit (INDEPENDENT_AMBULATORY_CARE_PROVIDER_SITE_OTHER): Payer: 59

## 2015-02-02 ENCOUNTER — Ambulatory Visit (INDEPENDENT_AMBULATORY_CARE_PROVIDER_SITE_OTHER): Payer: 59 | Admitting: Internal Medicine

## 2015-02-02 VITALS — BP 118/72 | HR 91 | Temp 97.6°F | Wt 308.2 lb

## 2015-02-02 DIAGNOSIS — R300 Dysuria: Secondary | ICD-10-CM | POA: Diagnosis not present

## 2015-02-02 DIAGNOSIS — N3281 Overactive bladder: Secondary | ICD-10-CM

## 2015-02-02 DIAGNOSIS — R3129 Other microscopic hematuria: Secondary | ICD-10-CM

## 2015-02-02 DIAGNOSIS — R312 Other microscopic hematuria: Secondary | ICD-10-CM | POA: Diagnosis not present

## 2015-02-02 HISTORY — DX: Dysuria: R30.0

## 2015-02-02 LAB — CBC WITH DIFFERENTIAL/PLATELET
Basophils Absolute: 0 10*3/uL (ref 0.0–0.1)
Basophils Relative: 0.4 % (ref 0.0–3.0)
Eosinophils Absolute: 0.2 10*3/uL (ref 0.0–0.7)
Eosinophils Relative: 2.8 % (ref 0.0–5.0)
HEMATOCRIT: 47.2 % (ref 39.0–52.0)
HEMOGLOBIN: 16.4 g/dL (ref 13.0–17.0)
LYMPHS PCT: 25 % (ref 12.0–46.0)
Lymphs Abs: 1.8 10*3/uL (ref 0.7–4.0)
MCHC: 34.8 g/dL (ref 30.0–36.0)
MCV: 90.2 fl (ref 78.0–100.0)
MONOS PCT: 8.4 % (ref 3.0–12.0)
Monocytes Absolute: 0.6 10*3/uL (ref 0.1–1.0)
Neutro Abs: 4.6 10*3/uL (ref 1.4–7.7)
Neutrophils Relative %: 63.4 % (ref 43.0–77.0)
Platelets: 153 10*3/uL (ref 150.0–400.0)
RBC: 5.24 Mil/uL (ref 4.22–5.81)
RDW: 13.2 % (ref 11.5–15.5)
WBC: 7.2 10*3/uL (ref 4.0–10.5)

## 2015-02-02 LAB — URINALYSIS, ROUTINE W REFLEX MICROSCOPIC
Bilirubin Urine: NEGATIVE
KETONES UR: NEGATIVE
NITRITE: NEGATIVE
Specific Gravity, Urine: 1.02 (ref 1.000–1.030)
Total Protein, Urine: NEGATIVE
URINE GLUCOSE: NEGATIVE
Urobilinogen, UA: 0.2 (ref 0.0–1.0)
pH: 5.5 (ref 5.0–8.0)

## 2015-02-02 LAB — PSA: PSA: 0.33 ng/mL (ref 0.10–4.00)

## 2015-02-02 LAB — SEDIMENTATION RATE: Sed Rate: 5 mm/hr (ref 0–22)

## 2015-02-02 MED ORDER — MIRABEGRON ER 50 MG PO TB24: 50.0000 mg | ORAL_TABLET | Freq: Every day | ORAL | Status: DC

## 2015-02-02 MED ORDER — PHENAZOPYRIDINE HCL 200 MG PO TABS: 200.0000 mg | ORAL_TABLET | Freq: Three times a day (TID) | ORAL | Status: DC | PRN

## 2015-02-02 NOTE — Assessment & Plan Note (Signed)
9/16 post-UTI Labs Marbetriq Pyridium prn

## 2015-02-02 NOTE — Progress Notes (Signed)
Subjective:  Patient ID: [name], male    DOB: [birth year]  Age: 54 y.o. MRN: [id number]  CC: No chief complaint on file.   HPI [name] presents for urgency and frequency. Has to pee all the time; it burns...  Outpatient Prescriptions Prior to Visit  Medication Sig Dispense Refill  . alprazolam (XANAX) 2 MG tablet Take 1 tablet (2 mg total) by mouth 3 (three) times daily as needed. for anxiety 90 tablet 2  . aspirin EC 81 MG tablet Take 1 tablet (81 mg total) by mouth daily.    [name] atorvastatin (LIPITOR) 20 MG tablet TAKE 1 TABLET BY MOUTH DAILY 90 tablet 2  . isosorbide mononitrate (IMDUR) 30 MG 24 hr tablet Take 1 tablet by mouth daily.  3  . losartan (COZAAR) 100 MG tablet Take 1 tablet (100 mg total) by mouth daily. 90 tablet 3  . naproxen (NAPROSYN) 500 MG tablet TAKE 1 TABLET BY MOUTH DAILY AS NEEDED 90 tablet 2  . nitroGLYCERIN (NITROSTAT) 0.4 MG SL tablet Place 1 tablet (0.4 mg total) under the tongue every 5 (five) minutes as needed. 20 tablet 3  . traMADol (ULTRAM) 50 MG tablet Take 1 tablet (50 mg total) by mouth every 12 (twelve) hours as needed for severe pain. 60 tablet 2  . levofloxacin (LEVAQUIN) 500 MG tablet Take 1 tablet (500 mg total) by mouth daily. 10 tablet 0   Facility-Administered Medications Prior to Visit  Medication Dose Route Frequency Provider Last Rate Last Dose  . albuterol (PROVENTIL) (2.5 MG/3ML) 0.083% nebulizer solution 2.5 mg  2.5 mg Nebulization Once Aleksei Plotnikov V, MD        ROS Review of Systems  Constitutional: Negative for appetite change, fatigue and unexpected weight change.  HENT: Negative for congestion, nosebleeds, sneezing, sore throat and trouble swallowing.   Eyes: Negative for itching and visual disturbance.  Respiratory: Negative for cough.   Cardiovascular: Negative for chest pain, palpitations and leg swelling.  Gastrointestinal: Negative for nausea, diarrhea, blood in stool and abdominal  distention.  Genitourinary: Positive for dysuria, urgency and frequency. Negative for hematuria, decreased urine volume and difficulty urinating.  Musculoskeletal: Negative for back pain, joint swelling, gait problem and neck pain.  Skin: Negative for rash.  Neurological: Negative for dizziness, tremors, speech difficulty and weakness.  Psychiatric/Behavioral: Negative for suicidal ideas, sleep disturbance, dysphoric mood and agitation. The patient is not nervous/anxious.     Objective:  BP 118/72 mmHg  Pulse 91  Temp(Src) 97.6 F (36.4 C)  Wt 308 lb 4 oz (139.821 kg)  SpO2 96%  BP Readings from Last 3 Encounters:  02/02/15 118/72  01/18/15 120/80  09/15/14 130/80    Wt Readings from Last 3 Encounters:  02/02/15 308 lb 4 oz (139.821 kg)  01/18/15 312 lb (141.522 kg)  09/15/14 306 lb (138.801 kg)    Physical Exam  Constitutional: He is oriented to person, place, and time. He appears well-developed. No distress.  NAD  HENT:  Mouth/Throat: Oropharynx is clear and moist.  Eyes: Conjunctivae are normal. Pupils are equal, round, and reactive to light.  Neck: Normal range of motion. No JVD present. No thyromegaly present.  Cardiovascular: Normal rate, regular rhythm, normal heart sounds and intact distal pulses.  Exam reveals no gallop and no friction rub.   No murmur heard. Pulmonary/Chest: Effort normal and breath sounds normal. No respiratory distress. He has no wheezes. He has no rales. He exhibits no tenderness.  Abdominal: Soft. Bowel sounds are  normal. He exhibits no distension and no mass. There is no tenderness. There is no rebound and no guarding.  Musculoskeletal: Normal range of motion. He exhibits no edema or tenderness.  Lymphadenopathy:    He has no cervical adenopathy.  Neurological: He is alert and oriented to person, place, and time. He has normal reflexes. No cranial nerve deficit. He exhibits normal muscle tone. He displays a negative Romberg sign. Coordination  and gait normal.  Skin: Skin is warm and dry. No rash noted.  Psychiatric: He has a normal mood and affect. His behavior is normal. Judgment and thought content normal.    Lab Results  Component Value Date   WBC 7.2 02/02/2015   HGB 16.4 02/02/2015   HCT 47.2 02/02/2015   PLT 153.0 02/02/2015   GLUCOSE 106* 01/18/2015   CHOL 126 10/18/2013   TRIG 376.0* 10/18/2013   HDL 36.00* 10/18/2013   LDLDIRECT 48.0 05/28/2012   LDLCALC 15 10/18/2013   ALT 31 07/17/2014   AST 27 07/17/2014   NA 141 01/18/2015   K 4.4 01/18/2015   CL 106 01/18/2015   CREATININE 0.81 01/18/2015   BUN 16 01/18/2015   CO2 28 01/18/2015   TSH 2.08 05/28/2012   PSA 0.33 02/02/2015   INR 0.9 11/16/2013   HGBA1C 5.8 01/18/2015    Dg Chest Port 1 View  07/18/2014   CLINICAL DATA:  Hypoxemia  EXAM: PORTABLE CHEST - 1 VIEW  COMPARISON:  07/17/2014  FINDINGS: Cardiomegaly again noted. Persistent mild interstitial prominence bilaterally suspicious for mild interstitial edema. No segmental infiltrate.  IMPRESSION: Mild interstitial prominence bilaterally suspicious for mild interstitial edema. No segmental infiltrate.   Electronically Signed   By: Lahoma Crocker M.D.   On: 07/18/2014 08:42   Dg Chest Port 1 View  07/17/2014   CLINICAL DATA:  Severe short of breath. Cough this morning. Cigarette smoker.  shortness of breath  EXAM: PORTABLE CHEST - 1 VIEW  COMPARISON:  10/12/2013.  FINDINGS: The cardiopericardial silhouette is enlarged. Lung volumes are lower than on prior. There is pulmonary vascular congestion. Respiratory motion is present on the exam, degrading evaluation of edema. No gross airspace consolidation. Panniculus projects over the inferior chest. Monitoring leads project over the chest.  IMPRESSION: Cardiomegaly and pulmonary vascular congestion suggesting mild CHF.   Electronically Signed   By: Dereck Ligas M.D.   On: 07/17/2014 07:49    Assessment & Plan:   Diagnoses and all orders for this  visit:  Dysuria -     CBC with Differential/Platelet; Future -     Urinalysis; Future -     PSA; Future -     Sedimentation rate; Future  Overactive bladder -     CBC with Differential/Platelet; Future -     Urinalysis; Future -     PSA; Future -     Sedimentation rate; Future  Other orders -     phenazopyridine (PYRIDIUM) 200 MG tablet; Take 1 tablet (200 mg total) by mouth 3 (three) times daily as needed for pain (burning). -     mirabegron ER (MYRBETRIQ) 50 MG TB24 tablet; Take 1 tablet (50 mg total) by mouth daily.  I have discontinued Mr. Killgore levofloxacin. I am also having him start on phenazopyridine and mirabegron ER. Additionally, I am having him maintain his aspirin EC, losartan, nitroGLYCERIN, atorvastatin, isosorbide mononitrate, alprazolam, naproxen, and traMADol. We will continue to administer albuterol.  Meds ordered this encounter  Medications  . phenazopyridine (PYRIDIUM) 200 MG tablet  Sig: Take 1 tablet (200 mg total) by mouth 3 (three) times daily as needed for pain (burning).    Dispense:  20 tablet    Refill:  0  . mirabegron ER (MYRBETRIQ) 50 MG TB24 tablet    Sig: Take 1 tablet (50 mg total) by mouth daily.    Dispense:  30 tablet    Refill:  11     Follow-up: No Follow-up on file.  Walker Kehr, MD

## 2015-02-02 NOTE — Progress Notes (Signed)
Pre visit review using our clinic review tool, if applicable. No additional management support is needed unless otherwise documented below in the visit note. 

## 2015-02-02 NOTE — Assessment & Plan Note (Signed)
9/16 post-UTI/prostatitis. R/o kidney stones. Treat sx's Urol ref. May need a CT scan

## 2015-02-09 ENCOUNTER — Encounter: Payer: Self-pay | Admitting: Internal Medicine

## 2015-03-27 ENCOUNTER — Other Ambulatory Visit: Payer: Self-pay | Admitting: *Deleted

## 2015-03-27 MED ORDER — ISOSORBIDE MONONITRATE ER 30 MG PO TB24: 30.0000 mg | ORAL_TABLET | Freq: Every day | ORAL | Status: DC

## 2015-04-18 ENCOUNTER — Encounter: Payer: Self-pay | Admitting: Internal Medicine

## 2015-04-18 ENCOUNTER — Ambulatory Visit (INDEPENDENT_AMBULATORY_CARE_PROVIDER_SITE_OTHER): Payer: 59 | Admitting: Internal Medicine

## 2015-04-18 VITALS — BP 120/80 | HR 77 | Wt 311.0 lb

## 2015-04-18 DIAGNOSIS — E785 Hyperlipidemia, unspecified: Secondary | ICD-10-CM

## 2015-04-18 DIAGNOSIS — F411 Generalized anxiety disorder: Secondary | ICD-10-CM

## 2015-04-18 DIAGNOSIS — K219 Gastro-esophageal reflux disease without esophagitis: Secondary | ICD-10-CM

## 2015-04-18 DIAGNOSIS — R7309 Other abnormal glucose: Secondary | ICD-10-CM | POA: Diagnosis not present

## 2015-04-18 DIAGNOSIS — M545 Low back pain, unspecified: Secondary | ICD-10-CM

## 2015-04-18 DIAGNOSIS — Z23 Encounter for immunization: Secondary | ICD-10-CM

## 2015-04-18 DIAGNOSIS — M79605 Pain in left leg: Secondary | ICD-10-CM

## 2015-04-18 MED ORDER — LOSARTAN POTASSIUM 100 MG PO TABS: 100.0000 mg | ORAL_TABLET | Freq: Every day | ORAL | Status: DC

## 2015-04-18 MED ORDER — ATORVASTATIN CALCIUM 20 MG PO TABS: 20.0000 mg | ORAL_TABLET | Freq: Every day | ORAL | Status: DC

## 2015-04-18 MED ORDER — VITAMIN D 1000 UNITS PO TABS: 1000.0000 [IU] | ORAL_TABLET | Freq: Every day | ORAL | Status: DC

## 2015-04-18 MED ORDER — NAPROXEN 500 MG PO TABS: ORAL_TABLET | ORAL | Status: DC

## 2015-04-18 MED ORDER — ALPRAZOLAM 2 MG PO TABS: 2.0000 mg | ORAL_TABLET | Freq: Three times a day (TID) | ORAL | Status: DC | PRN

## 2015-04-18 MED ORDER — TRAMADOL HCL 50 MG PO TABS: 50.0000 mg | ORAL_TABLET | Freq: Two times a day (BID) | ORAL | Status: DC | PRN

## 2015-04-18 NOTE — Assessment & Plan Note (Signed)
Doing well OTC meds

## 2015-04-18 NOTE — Progress Notes (Signed)
Pre visit review using our clinic review tool, if applicable. No additional management support is needed unless otherwise documented below in the visit note. 

## 2015-04-18 NOTE — Assessment & Plan Note (Signed)
Chronic Alprazolam prn  Potential benefits of a long term benzodiazepines  use as well as potential risks  and complications were explained to the patient and were aknowledged. 

## 2015-04-18 NOTE — Progress Notes (Signed)
Subjective:  Patient ID: [name], male    DOB: 1960/05/29  Age: 54 y.o. MRN: [id number]  CC: No chief complaint on file.   HPI [name] presents for HTN, OA, urinary urgency. Passed a kidney stone  Outpatient Prescriptions Prior to Visit  Medication Sig Dispense Refill  . aspirin EC 81 MG tablet Take 1 tablet (81 mg total) by mouth daily.    . isosorbide mononitrate (IMDUR) 30 MG 24 hr tablet Take 1 tablet (30 mg total) by mouth daily. 90 tablet 3  . nitroGLYCERIN (NITROSTAT) 0.4 MG SL tablet Place 1 tablet (0.4 mg total) under the tongue every 5 (five) minutes as needed. 20 tablet 3  . alprazolam (XANAX) 2 MG tablet Take 1 tablet (2 mg total) by mouth 3 (three) times daily as needed. for anxiety 90 tablet 2  . atorvastatin (LIPITOR) 20 MG tablet TAKE 1 TABLET BY MOUTH DAILY 90 tablet 2  . losartan (COZAAR) 100 MG tablet Take 1 tablet (100 mg total) by mouth daily. 90 tablet 3  . naproxen (NAPROSYN) 500 MG tablet TAKE 1 TABLET BY MOUTH DAILY AS NEEDED 90 tablet 2  . traMADol (ULTRAM) 50 MG tablet Take 1 tablet (50 mg total) by mouth every 12 (twelve) hours as needed for severe pain. 60 tablet 2  . mirabegron ER (MYRBETRIQ) 50 MG TB24 tablet Take 1 tablet (50 mg total) by mouth daily. (Patient not taking: Reported on 04/18/2015) 30 tablet 11  . phenazopyridine (PYRIDIUM) 200 MG tablet Take 1 tablet (200 mg total) by mouth 3 (three) times daily as needed for pain (burning). (Patient not taking: Reported on 04/18/2015) 20 tablet 0   Facility-Administered Medications Prior to Visit  Medication Dose Route Frequency Provider Last Rate Last Dose  . albuterol (PROVENTIL) (2.5 MG/3ML) 0.083% nebulizer solution 2.5 mg  2.5 mg Nebulization Once Zarria Towell V, MD        ROS Review of Systems  Constitutional: Negative for appetite change, fatigue and unexpected weight change.  HENT: Negative for congestion, nosebleeds, sneezing, sore throat and trouble  swallowing.   Eyes: Negative for itching and visual disturbance.  Respiratory: Negative for cough.   Cardiovascular: Negative for chest pain, palpitations and leg swelling.  Gastrointestinal: Negative for nausea, diarrhea, blood in stool and abdominal distention.  Genitourinary: Negative for frequency and hematuria.  Musculoskeletal: Positive for back pain. Negative for joint swelling, gait problem and neck pain.  Skin: Negative for rash.  Neurological: Negative for dizziness, tremors, speech difficulty and weakness.  Psychiatric/Behavioral: Negative for sleep disturbance, dysphoric mood and agitation. The patient is nervous/anxious.     Objective:  BP 120/80 mmHg  Pulse 77  Wt 311 lb (141.069 kg)  SpO2 96%  BP Readings from Last 3 Encounters:  04/18/15 120/80  02/02/15 118/72  01/18/15 120/80    Wt Readings from Last 3 Encounters:  04/18/15 311 lb (141.069 kg)  02/02/15 308 lb 4 oz (139.821 kg)  01/18/15 312 lb (141.522 kg)    Physical Exam  Constitutional: He is oriented to person, place, and time. He appears well-developed. No distress.  NAD  HENT:  Mouth/Throat: Oropharynx is clear and moist.  Eyes: Conjunctivae are normal. Pupils are equal, round, and reactive to light.  Neck: Normal range of motion. No JVD present. No thyromegaly present.  Cardiovascular: Normal rate, regular rhythm, normal heart sounds and intact distal pulses.  Exam reveals no gallop and no friction rub.   No murmur heard. Pulmonary/Chest: Effort normal and breath  sounds normal. No respiratory distress. He has no wheezes. He has no rales. He exhibits no tenderness.  Abdominal: Soft. Bowel sounds are normal. He exhibits no distension and no mass. There is no tenderness. There is no rebound and no guarding.  Musculoskeletal: Normal range of motion. He exhibits no edema or tenderness.  Lymphadenopathy:    He has no cervical adenopathy.  Neurological: He is alert and oriented to person, place, and  time. He has normal reflexes. No cranial nerve deficit. He exhibits normal muscle tone. He displays a negative Romberg sign. Coordination and gait normal.  Skin: Skin is warm and dry. No rash noted.  Psychiatric: He has a normal mood and affect. His behavior is normal. Judgment and thought content normal.    Lab Results  Component Value Date   WBC 7.2 02/02/2015   HGB 16.4 02/02/2015   HCT 47.2 02/02/2015   PLT 153.0 02/02/2015   GLUCOSE 106* 01/18/2015   CHOL 126 10/18/2013   TRIG 376.0* 10/18/2013   HDL 36.00* 10/18/2013   LDLDIRECT 48.0 05/28/2012   LDLCALC 15 10/18/2013   ALT 31 07/17/2014   AST 27 07/17/2014   NA 141 01/18/2015   K 4.4 01/18/2015   CL 106 01/18/2015   CREATININE 0.81 01/18/2015   BUN 16 01/18/2015   CO2 28 01/18/2015   TSH 2.08 05/28/2012   PSA 0.33 02/02/2015   INR 0.9 11/16/2013   HGBA1C 5.8 01/18/2015    Dg Chest Port 1 View  07/18/2014  CLINICAL DATA:  Hypoxemia EXAM: PORTABLE CHEST - 1 VIEW COMPARISON:  07/17/2014 FINDINGS: Cardiomegaly again noted. Persistent mild interstitial prominence bilaterally suspicious for mild interstitial edema. No segmental infiltrate. IMPRESSION: Mild interstitial prominence bilaterally suspicious for mild interstitial edema. No segmental infiltrate. Electronically Signed   By: Lahoma Crocker M.D.   On: 07/18/2014 08:42   Dg Chest Port 1 View  07/17/2014  CLINICAL DATA:  Severe short of breath. Cough this morning. Cigarette smoker. shortness of breath EXAM: PORTABLE CHEST - 1 VIEW COMPARISON:  10/12/2013. FINDINGS: The cardiopericardial silhouette is enlarged. Lung volumes are lower than on prior. There is pulmonary vascular congestion. Respiratory motion is present on the exam, degrading evaluation of edema. No gross airspace consolidation. Panniculus projects over the inferior chest. Monitoring leads project over the chest. IMPRESSION: Cardiomegaly and pulmonary vascular congestion suggesting mild CHF. Electronically Signed   By:  Dereck Ligas M.D.   On: 07/17/2014 07:49    Assessment & Plan:   Diagnoses and all orders for this visit:  Anxiety state  LBP radiating to left leg  Dyslipidemia  Gastroesophageal reflux disease without esophagitis  Elevated glucose  Need for influenza vaccination -     Flu Vaccine QUAD 36+ mos IM  Other orders -     alprazolam (XANAX) 2 MG tablet; Take 1 tablet (2 mg total) by mouth 3 (three) times daily as needed. for anxiety -     atorvastatin (LIPITOR) 20 MG tablet; Take 1 tablet (20 mg total) by mouth daily. -     losartan (COZAAR) 100 MG tablet; Take 1 tablet (100 mg total) by mouth daily. -     naproxen (NAPROSYN) 500 MG tablet; TAKE 1 TABLET BY MOUTH DAILY AS NEEDED -     traMADol (ULTRAM) 50 MG tablet; Take 1 tablet (50 mg total) by mouth every 12 (twelve) hours as needed for severe pain. -     cholecalciferol (VITAMIN D) 1000 UNITS tablet; Take 1 tablet (1,000 Units total) by mouth  daily.  I have discontinued Mr. Roll phenazopyridine and mirabegron ER. I have also changed his atorvastatin. Additionally, I am having him start on cholecalciferol. Lastly, I am having him maintain his aspirin EC, nitroGLYCERIN, isosorbide mononitrate, alprazolam, losartan, naproxen, and traMADol. We will continue to administer albuterol.  Meds ordered this encounter  Medications  . alprazolam (XANAX) 2 MG tablet    Sig: Take 1 tablet (2 mg total) by mouth 3 (three) times daily as needed. for anxiety    Dispense:  90 tablet    Refill:  2  . atorvastatin (LIPITOR) 20 MG tablet    Sig: Take 1 tablet (20 mg total) by mouth daily.    Dispense:  90 tablet    Refill:  3  . losartan (COZAAR) 100 MG tablet    Sig: Take 1 tablet (100 mg total) by mouth daily.    Dispense:  90 tablet    Refill:  3  . naproxen (NAPROSYN) 500 MG tablet    Sig: TAKE 1 TABLET BY MOUTH DAILY AS NEEDED    Dispense:  90 tablet    Refill:  3  . traMADol (ULTRAM) 50 MG tablet    Sig: Take 1 tablet (50 mg  total) by mouth every 12 (twelve) hours as needed for severe pain.    Dispense:  60 tablet    Refill:  2  . cholecalciferol (VITAMIN D) 1000 UNITS tablet    Sig: Take 1 tablet (1,000 Units total) by mouth daily.    Dispense:  100 tablet    Refill:  3     Follow-up: Return in about 3 months (around 07/17/2015) for a follow-up visit.  Walker Kehr, MD

## 2015-04-18 NOTE — Assessment & Plan Note (Signed)
On Lipitor 

## 2015-04-18 NOTE — Assessment & Plan Note (Signed)
Labs

## 2015-04-18 NOTE — Assessment & Plan Note (Signed)
Chronic  Tramadol prn  

## 2015-07-19 ENCOUNTER — Ambulatory Visit: Payer: 59 | Admitting: Internal Medicine

## 2015-07-26 ENCOUNTER — Other Ambulatory Visit (INDEPENDENT_AMBULATORY_CARE_PROVIDER_SITE_OTHER): Payer: BLUE CROSS/BLUE SHIELD

## 2015-07-26 ENCOUNTER — Encounter: Payer: Self-pay | Admitting: Internal Medicine

## 2015-07-26 ENCOUNTER — Ambulatory Visit (INDEPENDENT_AMBULATORY_CARE_PROVIDER_SITE_OTHER): Payer: BLUE CROSS/BLUE SHIELD | Admitting: Internal Medicine

## 2015-07-26 VITALS — BP 130/70 | HR 98 | Temp 97.6°F | Wt 319.0 lb

## 2015-07-26 DIAGNOSIS — N200 Calculus of kidney: Secondary | ICD-10-CM | POA: Diagnosis not present

## 2015-07-26 DIAGNOSIS — M545 Low back pain, unspecified: Secondary | ICD-10-CM

## 2015-07-26 DIAGNOSIS — N50819 Testicular pain, unspecified: Secondary | ICD-10-CM | POA: Diagnosis not present

## 2015-07-26 DIAGNOSIS — B1920 Unspecified viral hepatitis C without hepatic coma: Secondary | ICD-10-CM

## 2015-07-26 DIAGNOSIS — M79605 Pain in left leg: Secondary | ICD-10-CM

## 2015-07-26 HISTORY — DX: Calculus of kidney: N20.0

## 2015-07-26 HISTORY — DX: Testicular pain, unspecified: N50.819

## 2015-07-26 LAB — BASIC METABOLIC PANEL
BUN: 21 mg/dL (ref 6–23)
CALCIUM: 9.5 mg/dL (ref 8.4–10.5)
CO2: 29 meq/L (ref 19–32)
CREATININE: 1.09 mg/dL (ref 0.40–1.50)
Chloride: 104 mEq/L (ref 96–112)
GFR: 75.14 mL/min (ref >60.00)
Glucose, Bld: 149 mg/dL — ABNORMAL HIGH (ref 70–99)
Potassium: 3.9 mEq/L (ref 3.5–5.1)
Sodium: 141 mEq/L (ref 135–145)

## 2015-07-26 LAB — CBC WITH DIFFERENTIAL/PLATELET
BASOS ABS: 0 10*3/uL (ref 0.0–0.1)
Basophils Relative: 0.4 % (ref 0.0–3.0)
EOS ABS: 0.2 10*3/uL (ref 0.0–0.7)
Eosinophils Relative: 2.9 % (ref 0.0–5.0)
HEMATOCRIT: 45.2 % (ref 39.0–52.0)
Hemoglobin: 15.7 g/dL (ref 13.0–17.0)
LYMPHS ABS: 2.2 10*3/uL (ref 0.7–4.0)
LYMPHS PCT: 26 % (ref 12.0–46.0)
MCHC: 34.8 g/dL (ref 30.0–36.0)
MCV: 88.3 fl (ref 78.0–100.0)
Monocytes Absolute: 0.5 10*3/uL (ref 0.1–1.0)
Monocytes Relative: 6.1 % (ref 3.0–12.0)
NEUTROS ABS: 5.5 10*3/uL (ref 1.4–7.7)
NEUTROS PCT: 64.6 % (ref 43.0–77.0)
PLATELETS: 169 10*3/uL (ref 150.0–400.0)
RBC: 5.12 Mil/uL (ref 4.22–5.81)
RDW: 12.7 % (ref 11.5–15.5)
WBC: 8.6 10*3/uL (ref 4.0–10.5)

## 2015-07-26 LAB — URINALYSIS
BILIRUBIN URINE: NEGATIVE
Hgb urine dipstick: NEGATIVE
Ketones, ur: NEGATIVE
LEUKOCYTES UA: NEGATIVE
Nitrite: NEGATIVE
Specific Gravity, Urine: 1.025 (ref 1.000–1.030)
Total Protein, Urine: NEGATIVE
UROBILINOGEN UA: 1 (ref 0.0–1.0)
Urine Glucose: NEGATIVE
pH: 6 (ref 5.0–8.0)

## 2015-07-26 LAB — SEDIMENTATION RATE: Sed Rate: 5 mm/hr (ref 0–22)

## 2015-07-26 MED ORDER — TRAMADOL HCL 50 MG PO TABS: 50.0000 mg | ORAL_TABLET | Freq: Two times a day (BID) | ORAL | Status: DC | PRN

## 2015-07-26 MED ORDER — KETOROLAC TROMETHAMINE 10 MG PO TABS
10.0000 mg | ORAL_TABLET | Freq: Three times a day (TID) | ORAL | Status: DC | PRN
Start: 2015-07-26 — End: 2016-02-06

## 2015-07-26 MED ORDER — KETOROLAC TROMETHAMINE 60 MG/2ML IM SOLN
60.0000 mg | Freq: Once | INTRAMUSCULAR | Status: AC
  Administered 2015-07-26: 60 mg via INTRAMUSCULAR

## 2015-07-26 MED ORDER — ALPRAZOLAM 2 MG PO TABS: 2.0000 mg | ORAL_TABLET | Freq: Three times a day (TID) | ORAL | Status: DC | PRN

## 2015-07-26 MED ORDER — CIPROFLOXACIN HCL 500 MG PO TABS: 500.0000 mg | ORAL_TABLET | Freq: Two times a day (BID) | ORAL | Status: DC

## 2015-07-26 NOTE — Assessment & Plan Note (Signed)
3/17 worse - r/o kidney stone CT abd Labs

## 2015-07-26 NOTE — Assessment & Plan Note (Addendum)
3/17 left side Epididymitis L vs other - r/o kidney stones Cipro x 10 d Relafen x 2 weeks Athletic briefs

## 2015-07-26 NOTE — Assessment & Plan Note (Signed)
Toradol 10 mg tid prn CT abd

## 2015-07-26 NOTE — Progress Notes (Signed)
Subjective:  Patient ID: [name], male    DOB: 09-May-1961  Age: 55 y.o. MRN: [id number]  CC: No chief complaint on file.   HPI [name] presents for L LS LBP, testes L>>R hurt x 1 mo, tired, chills x 2 wks  Outpatient Prescriptions Prior to Visit  Medication Sig Dispense Refill  . aspirin EC 81 MG tablet Take 1 tablet (81 mg total) by mouth daily.    [name] atorvastatin (LIPITOR) 20 MG tablet Take 1 tablet (20 mg total) by mouth daily. 90 tablet 3  . cholecalciferol (VITAMIN D) 1000 UNITS tablet Take 1 tablet (1,000 Units total) by mouth daily. 100 tablet 3  . isosorbide mononitrate (IMDUR) 30 MG 24 hr tablet Take 1 tablet (30 mg total) by mouth daily. 90 tablet 3  . losartan (COZAAR) 100 MG tablet Take 1 tablet (100 mg total) by mouth daily. 90 tablet 3  . naproxen (NAPROSYN) 500 MG tablet TAKE 1 TABLET BY MOUTH DAILY AS NEEDED 90 tablet 3  . nitroGLYCERIN (NITROSTAT) 0.4 MG SL tablet Place 1 tablet (0.4 mg total) under the tongue every 5 (five) minutes as needed. 20 tablet 3  . alprazolam (XANAX) 2 MG tablet Take 1 tablet (2 mg total) by mouth 3 (three) times daily as needed. for anxiety 90 tablet 2  . traMADol (ULTRAM) 50 MG tablet Take 1 tablet (50 mg total) by mouth every 12 (twelve) hours as needed for severe pain. 60 tablet 2   Facility-Administered Medications Prior to Visit  Medication Dose Route Frequency Provider Last Rate Last Dose  . albuterol (PROVENTIL) (2.5 MG/3ML) 0.083% nebulizer solution 2.5 mg  2.5 mg Nebulization Once Aleksei Plotnikov V, MD        ROS Review of Systems  Constitutional: Positive for chills and fatigue.  Cardiovascular: Negative for leg swelling.  Gastrointestinal: Positive for abdominal pain.  Genitourinary: Positive for urgency, scrotal swelling and testicular pain.  Musculoskeletal: Positive for back pain. Negative for gait problem.  Neurological: Positive for weakness.  Psychiatric/Behavioral: Negative for  sleep disturbance. The patient is not nervous/anxious.     Objective:  BP 130/70 mmHg  Pulse 98  Temp(Src) 97.6 F (36.4 C) (Oral)  Wt 319 lb (144.697 kg)  SpO2 95%  BP Readings from Last 3 Encounters:  07/26/15 130/70  04/18/15 120/80  02/02/15 118/72    Wt Readings from Last 3 Encounters:  07/26/15 319 lb (144.697 kg)  04/18/15 311 lb (141.069 kg)  02/02/15 308 lb 4 oz (139.821 kg)    Physical Exam  Constitutional: He is oriented to person, place, and time. He appears well-developed. No distress.  NAD  HENT:  Mouth/Throat: Oropharynx is clear and moist.  Eyes: Conjunctivae are normal. Pupils are equal, round, and reactive to light.  Neck: Normal range of motion. No JVD present. No thyromegaly present.  Cardiovascular: Normal rate, regular rhythm, normal heart sounds and intact distal pulses.  Exam reveals no gallop and no friction rub.   No murmur heard. Pulmonary/Chest: Effort normal and breath sounds normal. No respiratory distress. He has no wheezes. He has no rales. He exhibits no tenderness.  Abdominal: Soft. Bowel sounds are normal. He exhibits no distension and no mass. There is no tenderness. There is no rebound and no guarding.  Genitourinary: No penile tenderness.  Musculoskeletal: Normal range of motion. He exhibits tenderness. He exhibits no edema.  Lymphadenopathy:    He has no cervical adenopathy.  Neurological: He is alert and oriented to person, place,  and time. He has normal reflexes. No cranial nerve deficit. He exhibits normal muscle tone. He displays a negative Romberg sign. Coordination and gait normal.  Skin: Skin is warm and dry. No rash noted. No pallor.  Psychiatric: He has a normal mood and affect. His behavior is normal. Judgment and thought content normal.  L testis is tender on top, Hydrocele is present B  Lab Results  Component Value Date   WBC 8.6 07/26/2015   HGB 15.7 07/26/2015   HCT 45.2 07/26/2015   PLT 169.0 07/26/2015   GLUCOSE  149* 07/26/2015   CHOL 126 10/18/2013   TRIG 376.0* 10/18/2013   HDL 36.00* 10/18/2013   LDLDIRECT 48.0 05/28/2012   LDLCALC 15 10/18/2013   ALT 31 07/17/2014   AST 27 07/17/2014   NA 141 07/26/2015   K 3.9 07/26/2015   CL 104 07/26/2015   CREATININE 1.09 07/26/2015   BUN 21 07/26/2015   CO2 29 07/26/2015   TSH 2.08 05/28/2012   PSA 0.33 02/02/2015   INR 0.9 11/16/2013   HGBA1C 5.8 01/18/2015    Dg Chest Port 1 View  07/18/2014  CLINICAL DATA:  Hypoxemia EXAM: PORTABLE CHEST - 1 VIEW COMPARISON:  07/17/2014 FINDINGS: Cardiomegaly again noted. Persistent mild interstitial prominence bilaterally suspicious for mild interstitial edema. No segmental infiltrate. IMPRESSION: Mild interstitial prominence bilaterally suspicious for mild interstitial edema. No segmental infiltrate. Electronically Signed   By: Lahoma Crocker M.D.   On: 07/18/2014 08:42   Dg Chest Port 1 View  07/17/2014  CLINICAL DATA:  Severe short of breath. Cough this morning. Cigarette smoker. shortness of breath EXAM: PORTABLE CHEST - 1 VIEW COMPARISON:  10/12/2013. FINDINGS: The cardiopericardial silhouette is enlarged. Lung volumes are lower than on prior. There is pulmonary vascular congestion. Respiratory motion is present on the exam, degrading evaluation of edema. No gross airspace consolidation. Panniculus projects over the inferior chest. Monitoring leads project over the chest. IMPRESSION: Cardiomegaly and pulmonary vascular congestion suggesting mild CHF. Electronically Signed   By: Dereck Ligas M.D.   On: 07/17/2014 07:49    Assessment & Plan:   Diagnoses and all orders for this visit:  Testes pain -     Basic metabolic panel; Future -     CBC with Differential/Platelet; Future -     Urinalysis; Future -     Sedimentation rate; Future -     CT Abdomen Pelvis W Contrast; Future -     ketorolac (TORADOL) injection 60 mg; Inject 2 mLs (60 mg total) into the muscle once.  LBP radiating to left leg -     Basic  metabolic panel; Future -     CBC with Differential/Platelet; Future -     Urinalysis; Future -     Sedimentation rate; Future -     CT Abdomen Pelvis W Contrast; Future -     Hepatitis C antibody; Future -     ketorolac (TORADOL) injection 60 mg; Inject 2 mLs (60 mg total) into the muscle once.  Kidney stones -     Basic metabolic panel; Future -     CBC with Differential/Platelet; Future -     Urinalysis; Future -     Sedimentation rate; Future -     CT Abdomen Pelvis W Contrast; Future  Other orders -     alprazolam (XANAX) 2 MG tablet; Take 1 tablet (2 mg total) by mouth 3 (three) times daily as needed. for anxiety -     traMADol (ULTRAM) 50  MG tablet; Take 1 tablet (50 mg total) by mouth every 12 (twelve) hours as needed for severe pain. -     ciprofloxacin (CIPRO) 500 MG tablet; Take 1 tablet (500 mg total) by mouth 2 (two) times daily. -     ketorolac (TORADOL) 10 MG tablet; Take 1 tablet (10 mg total) by mouth every 8 (eight) hours as needed for severe pain (kidney pai).  I am having Mr. Meland start on ciprofloxacin and ketorolac. I am also having him maintain his aspirin EC, nitroGLYCERIN, isosorbide mononitrate, atorvastatin, losartan, naproxen, cholecalciferol, alprazolam, and traMADol. We administered ketorolac. We will continue to administer albuterol.  Meds ordered this encounter  Medications  . alprazolam (XANAX) 2 MG tablet    Sig: Take 1 tablet (2 mg total) by mouth 3 (three) times daily as needed. for anxiety    Dispense:  90 tablet    Refill:  2  . traMADol (ULTRAM) 50 MG tablet    Sig: Take 1 tablet (50 mg total) by mouth every 12 (twelve) hours as needed for severe pain.    Dispense:  60 tablet    Refill:  2  . ciprofloxacin (CIPRO) 500 MG tablet    Sig: Take 1 tablet (500 mg total) by mouth 2 (two) times daily.    Dispense:  20 tablet    Refill:  0  . ketorolac (TORADOL) 10 MG tablet    Sig: Take 1 tablet (10 mg total) by mouth every 8 (eight) hours as  needed for severe pain (kidney pai).    Dispense:  20 tablet    Refill:  0  . ketorolac (TORADOL) injection 60 mg    Sig:      Follow-up: Return in about 2 weeks (around 08/09/2015) for a follow-up visit.  Walker Kehr, MD

## 2015-07-26 NOTE — Patient Instructions (Signed)
Athletic briefsEpididymitis Epididymitis is swelling (inflammation) of the epididymis. The epididymis is a cord-like structure that is located along the top and back part of the testicle. It collects and stores sperm from the testicle. This condition can also cause pain and swelling of the testicle and scrotum. Symptoms usually start suddenly (acute epididymitis). Sometimes epididymitis starts gradually and lasts for a while (chronic epididymitis). This type may be harder to treat. CAUSES In men 39 and younger, this condition is usually caused by a bacterial infection or sexually transmitted disease (STD), such as:  Gonorrhea.  Chlamydia.  In men 104 and older who do not have anal sex, this condition is usually caused by bacteria from a blockage or abnormalities in the urinary system. These can result from:  Having a tube placed into the bladder (urinary catheter).  Having an enlarged or inflamed prostate gland.  Having recent urinary tract surgery. In men who have a condition that weakens the body's defense system (immune system), such as HIV, this condition can be caused by:   Other bacteria, including tuberculosis and syphilis.  Viruses.  Fungi. Sometimes this condition occurs without infection. That may happen if urine flows backward into the epididymis after heavy lifting or straining. RISK FACTORS This condition is more likely to develop in men:  Who have unprotected sex with more than one partner.  Who have anal sex.   Who have recently had surgery.   Who have a urinary catheter.  Who have urinary problems.  Who have a suppressed immune system. SYMPTOMS  This condition usually begins suddenly with chills, fever, and pain behind the scrotum and in the testicle. Other symptoms include:   Swelling of the scrotum, testicle, or both.  Pain whenejaculatingor urinating.  Pain in the back or belly.  Nausea.  Itching and discharge from the penis.  Frequent need  to pass urine.  Redness and tenderness of the scrotum. DIAGNOSIS Your health care provider can diagnose this condition based on your symptoms and medical history. Your health care provider will also do a physical exam to ask about your symptoms and check your scrotum and testicle for swelling, pain, and redness. You may also have other tests, including:   Examination of discharge from the penis.  Urine tests for infections, such as STDs.  Your health care provider may test you for other STDs, including HIV. TREATMENT Treatment for this condition depends on the cause. If your condition is caused by a bacterial infection, oral antibiotic medicine may be prescribed. If the bacterial infection has spread to your blood, you may need to receive IV antibiotics. Nonbacterial epididymitis is treated with home care that includes bed rest and elevation of the scrotum. Surgery may be needed to treat:  Bacterial epididymitis that causes pus to build up in the scrotum (abscess).  Chronic epididymitis that has not responded to other treatments. HOME CARE INSTRUCTIONS Medicines  Take over-the-counter and prescription medicines only as told by your health care provider.   If you were prescribed an antibiotic medicine, take it as told by your health care provider. Do not stop taking the antibiotic even if your condition improves. Sexual Activity  If your epididymitis was caused by an STD, avoid sexual activity until your treatment is complete.  Inform your sexual partner or partners if you test positive for an STD. They may need to be treated.Do not engage in sexual activity with your partner or partners until their treatment is completed. General Instructions  Return to your normal activities as  told by your health care provider. Ask your health care provider what activities are safe for you.  Keep your scrotum elevated and supported while resting. Ask your health care provider if you should  wear a scrotal support, such as a jockstrap. Wear it as told by your health care provider.  If directed, apply ice to the affected area:   Put ice in a plastic bag.  Place a towel between your skin and the bag.  Leave the ice on for 20 minutes, 2-3 times per day.  Try taking a sitz bath to help with discomfort. This is a warm water bath that is taken while you are sitting down. The water should only come up to your hips and should cover your buttocks. Do this 3-4 times per day or as told by your health care provider.  Keep all follow-up visits as told by your health care provider. This is important. SEEK MEDICAL CARE IF:   You have a fever.   Your pain medicine is not helping.   Your pain is getting worse.   Your symptoms do not improve within three days.   This information is not intended to replace advice given to you by your health care provider. Make sure you discuss any questions you have with your health care provider.   Document Released: 04/26/2000 Document Revised: 01/18/2015 Document Reviewed: 09/14/2014 Elsevier Interactive Patient Education [employer].

## 2015-07-26 NOTE — Progress Notes (Signed)
Pre visit review using our clinic review tool, if applicable. No additional management support is needed unless otherwise documented below in the visit note. 

## 2015-07-27 ENCOUNTER — Encounter: Payer: Self-pay | Admitting: Internal Medicine

## 2015-07-27 LAB — HEPATITIS C ANTIBODY: HCV Ab: REACTIVE — AB

## 2015-07-28 ENCOUNTER — Telehealth: Payer: Self-pay | Admitting: Internal Medicine

## 2015-07-28 LAB — HEPATITIS C RNA QUANTITATIVE
HCV Quantitative Log: 1.62 {Log} — ABNORMAL HIGH (ref <1.18)
HCV Quantitative: 42 IU/mL — ABNORMAL HIGH (ref <15)

## 2015-07-28 NOTE — Telephone Encounter (Signed)
Pt called to check up on the lab result  Pt also checking up on the referral for CT and ultrasound.  Pt call pt back, in pain, really need help

## 2015-07-28 NOTE — Telephone Encounter (Signed)
CT order is in progress Labs OK Hep C test is (+) - more accurate test is in progress Is he better? Thx

## 2015-07-31 NOTE — Telephone Encounter (Signed)
Pt called back and has questions regarding the Hep C results and that he is still in pain

## 2015-07-31 NOTE — Telephone Encounter (Signed)
Pt informed per PCP his pain is not related to his positive Hep C. Pt is still in pain. He is scheduled for CT Renal stone study 08/01/15. I advised him I will call him back with those results and the additional Hepatitis labs once PCP has reviewed them.

## 2015-08-01 ENCOUNTER — Ambulatory Visit (INDEPENDENT_AMBULATORY_CARE_PROVIDER_SITE_OTHER)
Admission: RE | Admit: 2015-08-01 | Discharge: 2015-08-01 | Disposition: A | Discharge status: Home / Self Care | Payer: BLUE CROSS/BLUE SHIELD | Source: Ambulatory Visit | Attending: Internal Medicine | Admitting: Internal Medicine

## 2015-08-01 DIAGNOSIS — N50819 Testicular pain, unspecified: Secondary | ICD-10-CM | POA: Diagnosis not present

## 2015-08-01 DIAGNOSIS — N200 Calculus of kidney: Secondary | ICD-10-CM | POA: Diagnosis not present

## 2015-08-01 DIAGNOSIS — M79605 Pain in left leg: Secondary | ICD-10-CM

## 2015-08-01 DIAGNOSIS — M545 Low back pain, unspecified: Secondary | ICD-10-CM

## 2015-08-02 NOTE — Addendum Note (Signed)
Addended by: Cassandria Anger on: 08/02/2015 09:32 PM   Modules accepted: Orders

## 2015-08-09 ENCOUNTER — Ambulatory Visit (INDEPENDENT_AMBULATORY_CARE_PROVIDER_SITE_OTHER): Payer: BLUE CROSS/BLUE SHIELD | Admitting: Internal Medicine

## 2015-08-09 ENCOUNTER — Encounter: Payer: Self-pay | Admitting: Internal Medicine

## 2015-08-09 VITALS — BP 120/60 | HR 90 | Wt 313.0 lb

## 2015-08-09 DIAGNOSIS — N50819 Testicular pain, unspecified: Secondary | ICD-10-CM

## 2015-08-09 DIAGNOSIS — R300 Dysuria: Secondary | ICD-10-CM

## 2015-08-09 DIAGNOSIS — G4733 Obstructive sleep apnea (adult) (pediatric): Secondary | ICD-10-CM

## 2015-08-09 DIAGNOSIS — R3129 Other microscopic hematuria: Secondary | ICD-10-CM

## 2015-08-09 NOTE — Assessment & Plan Note (Signed)
Urology appt pending ABX is finished

## 2015-08-09 NOTE — Progress Notes (Addendum)
Subjective:  Patient ID: [name], male    DOB: May 20, 1960  Age: 55 y.o. MRN: [id number]  CC: No chief complaint on file.   HPI [name] presents for LBP, testicular pain - resolving on abx F/u OSA: pt is compliant with CPAP and has been using and benefiting from CPAP therapy  Outpatient Prescriptions Prior to Visit  Medication Sig Dispense Refill  . alprazolam (XANAX) 2 MG tablet Take 1 tablet (2 mg total) by mouth 3 (three) times daily as needed. for anxiety 90 tablet 2  . aspirin EC 81 MG tablet Take 1 tablet (81 mg total) by mouth daily.    [name] atorvastatin (LIPITOR) 20 MG tablet Take 1 tablet (20 mg total) by mouth daily. 90 tablet 3  . cholecalciferol (VITAMIN D) 1000 UNITS tablet Take 1 tablet (1,000 Units total) by mouth daily. 100 tablet 3  . ciprofloxacin (CIPRO) 500 MG tablet Take 1 tablet (500 mg total) by mouth 2 (two) times daily. 20 tablet 0  . isosorbide mononitrate (IMDUR) 30 MG 24 hr tablet Take 1 tablet (30 mg total) by mouth daily. 90 tablet 3  . ketorolac (TORADOL) 10 MG tablet Take 1 tablet (10 mg total) by mouth every 8 (eight) hours as needed for severe pain (kidney pai). 20 tablet 0  . losartan (COZAAR) 100 MG tablet Take 1 tablet (100 mg total) by mouth daily. 90 tablet 3  . naproxen (NAPROSYN) 500 MG tablet TAKE 1 TABLET BY MOUTH DAILY AS NEEDED 90 tablet 3  . nitroGLYCERIN (NITROSTAT) 0.4 MG SL tablet Place 1 tablet (0.4 mg total) under the tongue every 5 (five) minutes as needed. 20 tablet 3  . traMADol (ULTRAM) 50 MG tablet Take 1 tablet (50 mg total) by mouth every 12 (twelve) hours as needed for severe pain. 60 tablet 2   Facility-Administered Medications Prior to Visit  Medication Dose Route Frequency Provider Last Rate Last Dose  . albuterol (PROVENTIL) (2.5 MG/3ML) 0.083% nebulizer solution 2.5 mg  2.5 mg Nebulization Once Aleksei Plotnikov V, MD        ROS Review of Systems  Constitutional: Negative for appetite  change, fatigue and unexpected weight change.  HENT: Negative for congestion, nosebleeds, sneezing, sore throat and trouble swallowing.   Eyes: Negative for itching and visual disturbance.  Respiratory: Negative for cough.   Cardiovascular: Negative for chest pain, palpitations and leg swelling.  Gastrointestinal: Negative for nausea, diarrhea, blood in stool and abdominal distention.  Genitourinary: Negative for frequency and hematuria.  Musculoskeletal: Positive for back pain. Negative for joint swelling and neck pain.  Skin: Negative for rash.  Neurological: Negative for dizziness, tremors, speech difficulty and weakness.  Psychiatric/Behavioral: Negative for sleep disturbance, dysphoric mood and agitation. The patient is not nervous/anxious.     Objective:  BP 120/60 mmHg  Pulse 90  Wt 313 lb (141.976 kg)  SpO2 95%  BP Readings from Last 3 Encounters:  08/09/15 120/60  07/26/15 130/70  04/18/15 120/80    Wt Readings from Last 3 Encounters:  08/09/15 313 lb (141.976 kg)  07/26/15 319 lb (144.697 kg)  04/18/15 311 lb (141.069 kg)    Physical Exam  Constitutional: He is oriented to person, place, and time. He appears well-developed. No distress.  NAD  HENT:  Mouth/Throat: Oropharynx is clear and moist.  Eyes: Conjunctivae are normal. Pupils are equal, round, and reactive to light.  Neck: Normal range of motion. No JVD present. No thyromegaly present.  Cardiovascular: Normal rate, regular rhythm,  normal heart sounds and intact distal pulses.  Exam reveals no gallop and no friction rub.   No murmur heard. Pulmonary/Chest: Effort normal and breath sounds normal. No respiratory distress. He has no wheezes. He has no rales. He exhibits no tenderness.  Abdominal: Soft. Bowel sounds are normal. He exhibits no distension and no mass. There is no tenderness. There is no rebound and no guarding.  Musculoskeletal: Normal range of motion. He exhibits no edema or tenderness.    Lymphadenopathy:    He has no cervical adenopathy.  Neurological: He is alert and oriented to person, place, and time. He has normal reflexes. No cranial nerve deficit. He exhibits normal muscle tone. He displays a negative Romberg sign. Coordination and gait normal.  Skin: Skin is warm and dry. No rash noted.  Psychiatric: He has a normal mood and affect. His behavior is normal. Judgment and thought content normal.    Lab Results  Component Value Date   WBC 8.6 07/26/2015   HGB 15.7 07/26/2015   HCT 45.2 07/26/2015   PLT 169.0 07/26/2015   GLUCOSE 149* 07/26/2015   CHOL 126 10/18/2013   TRIG 376.0* 10/18/2013   HDL 36.00* 10/18/2013   LDLDIRECT 48.0 05/28/2012   LDLCALC 15 10/18/2013   ALT 31 07/17/2014   AST 27 07/17/2014   NA 141 07/26/2015   K 3.9 07/26/2015   CL 104 07/26/2015   CREATININE 1.09 07/26/2015   BUN 21 07/26/2015   CO2 29 07/26/2015   TSH 2.08 05/28/2012   PSA 0.33 02/02/2015   INR 0.9 11/16/2013   HGBA1C 5.8 01/18/2015    Ct Renal Stone Study  08/01/2015  CLINICAL DATA:  Left-sided abdomen pain radiating to the left groin, some hematuria, history of kidney stones EXAM: CT ABDOMEN AND PELVIS WITHOUT CONTRAST TECHNIQUE: Multidetector CT imaging of the abdomen and pelvis was performed following the standard protocol without IV contrast. COMPARISON:  None. FINDINGS: The lung bases are clear. The liver is unremarkable in the unenhanced state. The gallbladder is contracted and no calcified gallstones are seen. The pancreas is normal in size and the pancreatic duct is not dilated. The adrenal glands and spleen are unremarkable. The stomach is moderately distended with fluid and food debris. Only small right renal calculi are present of no more than 3 mm in diameter. No definite left renal calculi are seen. There is no evidence of hydronephrosis is seen. The proximal ureters are normal in caliber the abdominal aorta is normal in caliber with moderate atherosclerotic  change present. No adenopathy is seen. The distal ureters are normal in caliber and no distal ureteral calculus is seen. The urinary bladder is not well distended but no bladder abnormality is seen. The prostate is normal in size and there are prostatic calculi present. Fat enters the inguinal canals but no inguinal hernia is seen. There are a few rectosigmoid colon diverticula noted but no diverticulitis is seen. The terminal ileum and the appendix are unremarkable. Degenerative disc disease is present at L4-5 and L5-S1 where there is loss of disc space and sclerosis with spurring as well as vacuum disc phenomenon. IMPRESSION: 1. Small nonobstructing right renal calculi. No definite left renal calculi and no ureteral calculi are noted. 2. The appendix and terminal ileum are unremarkable. 3. Rectosigmoid and descending colon diverticula. No diverticulitis. 4. Degenerative disc disease at L4-5 and L5-S1. Electronically Signed   By: Ivar Drape M.D.   On: 08/01/2015 11:19    Assessment & Plan:   Diagnoses and  all orders for this visit:  Testes pain  Dysuria  Microhematuria  I am having Mr. Risko maintain his aspirin EC, nitroGLYCERIN, isosorbide mononitrate, atorvastatin, losartan, naproxen, cholecalciferol, alprazolam, traMADol, ciprofloxacin, and ketorolac. We will continue to administer albuterol.  No orders of the defined types were placed in this encounter.     Follow-up: No Follow-up on file.  Walker Kehr, MD

## 2015-08-09 NOTE — Assessment & Plan Note (Signed)
No sx's 

## 2015-08-09 NOTE — Progress Notes (Signed)
Pre visit review using our clinic review tool, if applicable. No additional management support is needed unless otherwise documented below in the visit note. 

## 2015-08-09 NOTE — Assessment & Plan Note (Signed)
Urol appt pending

## 2015-08-17 ENCOUNTER — Telehealth: Payer: Self-pay | Admitting: *Deleted

## 2015-08-17 NOTE — Telephone Encounter (Signed)
Receive call from pt stating he been trying to get another Cpap machine in whicg Dr. Alain Marion has given him a prescription for. Pt took rx to [facility] and he states now they are asking for a face to face visit w/ information concerning why he need cpap machine replace. Pt states he saw MD on 08/09/15 and they talk about it, but there isn't any medial information on that visit. Pt requesting md to add information to that visit and fax notes to [facility] att: Barbaraann Rondo...[name]

## 2015-08-17 NOTE — Telephone Encounter (Signed)
I confirm: we did have this conversation FTF. I confirm - he needs a new CPAP machine/accessories  Thx

## 2015-08-18 NOTE — Telephone Encounter (Signed)
Faxed OV notes from 08/09/15 to [facility]...[name]

## 2015-08-18 NOTE — Progress Notes (Signed)
   Subjective:    Patient ID: [name], male    DOB: Sep 04, 1960, 55 y.o.   MRN: [id number]  HPI    Review of Systems     Objective:   Physical Exam        Assessment & Plan:  Pt is needing new c-pap machine. The one that he has is over 52 years old. Due to COPD and OSA must be worn every night. Also concern about bacteria in mask from over the years, and wear and tear. Pt is needing new C-Pap w/supplies

## 2015-08-24 ENCOUNTER — Telehealth: Payer: Self-pay | Admitting: *Deleted

## 2015-08-24 NOTE — Telephone Encounter (Signed)
Forward msg to Dr. Alain Marion pls advise on msg below...[name]

## 2015-08-24 NOTE — Telephone Encounter (Signed)
-----   Message from Darlina Guys sent at 08/24/2015  1:06 PM EDT ----- Good afternoon. We are working on the order for replacement CPAP for this patient.  We are missing documentation that the pt is compliant with CPAP and has been using and benefiting from CPAP therapy.  This is required by insurance.  Do you think that Dr. Alain Marion would amend his recent office visit note to include this documentation?  Please advise. Thank you [place] [phone]

## 2015-08-27 NOTE — Telephone Encounter (Signed)
What do I need to do? Thx 

## 2015-08-28 NOTE — Telephone Encounter (Signed)
Done. Thx.

## 2015-08-28 NOTE — Telephone Encounter (Signed)
Dr Alain Marion, they are requesting that you amend your recent note to include  documentation that the pt is compliant with CPAP and has been using and benefiting from CPAP therapy. This is required by insurance.  Thanks!

## 2015-08-28 NOTE — Addendum Note (Signed)
Addended by: Cassandria Anger on: 08/28/2015 09:33 AM   Modules accepted: Miquel Dunn

## 2015-08-28 NOTE — Assessment & Plan Note (Signed)
pt is compliant with CPAP and has been using and benefiting from CPAP therapy

## 2015-08-29 ENCOUNTER — Telehealth: Payer: Self-pay | Admitting: *Deleted

## 2015-08-29 DIAGNOSIS — G4733 Obstructive sleep apnea (adult) (pediatric): Secondary | ICD-10-CM

## 2015-08-29 NOTE — Telephone Encounter (Signed)
Yes. Thx.

## 2015-08-29 NOTE — Telephone Encounter (Signed)
-----   Message from Darlina Guys sent at 08/29/2015 12:32 PM EDT ----- Clint Lipps. We received the amended office visit note but we are still needing an order.  The last order we have on file was for Auto BiPAP IPAP Max 25, EPAP Min 5 and PS 4. If the MD wants to order this again, please enter a "For Home Use Only BiPAP order with these settings using this wording which is required by insurance.  Let me know if you have questions.  Thanks

## 2015-08-29 NOTE — Telephone Encounter (Signed)
Forwarding msg to [employer]. Md has amend ov note...[name]

## 2015-08-29 NOTE — Telephone Encounter (Signed)
Ok [name] I have entered a DME order for "Home use Bipap machine".November 22, 1960 you want me to fax it to [facility] or can you get it...[name]

## 2015-08-31 NOTE — Telephone Encounter (Signed)
Pt called back to speak to you. Call pt @ [phone]. Thank you!

## 2015-08-31 NOTE — Addendum Note (Signed)
Addended by: Earnstine Regal on: 08/31/2015 03:31 PM   Modules accepted: Orders

## 2015-08-31 NOTE — Telephone Encounter (Signed)
Called pt he states that per Barbaraann Rondo @ [facility] states he need to have the settings on the rx for Bipap. Rx need to say keep setting at 21/21. Inform pt will update DME w/setting, but will be tomorrow before MD can sign because he is not in the office this afternoon. Will fax to [facility] att @ [phone]...[name]

## 2015-09-01 NOTE — Telephone Encounter (Signed)
MD sign DME fax to [facility] @ [facility]...[name]

## 2015-09-25 ENCOUNTER — Telehealth: Payer: Self-pay

## 2015-09-25 NOTE — Telephone Encounter (Signed)
Message left on machine for patient to call our office to confirm New Hepatitis C appointment with Dr Linus Salmons on  11-06-15 @ 2pm.  He will need to schedule for labs prior to office visit.    Laverle Patter, RN

## 2015-10-11 ENCOUNTER — Encounter: Payer: BLUE CROSS/BLUE SHIELD | Admitting: Internal Medicine

## 2015-10-19 ENCOUNTER — Other Ambulatory Visit: Payer: Self-pay | Admitting: Internal Medicine

## 2015-10-23 NOTE — Telephone Encounter (Signed)
Refills called into [facility] had to leave on pharmacy vm///[id number]

## 2015-10-26 ENCOUNTER — Telehealth: Payer: Self-pay | Admitting: Cardiology

## 2015-10-26 ENCOUNTER — Emergency Department (HOSPITAL_COMMUNITY): Payer: BLUE CROSS/BLUE SHIELD

## 2015-10-26 ENCOUNTER — Encounter (HOSPITAL_COMMUNITY): Payer: Self-pay | Admitting: Neurology

## 2015-10-26 ENCOUNTER — Emergency Department (HOSPITAL_COMMUNITY)
Admission: EM | Admit: 2015-10-26 | Discharge: 2015-10-26 | Disposition: A | Discharge status: Home / Self Care | Payer: BLUE CROSS/BLUE SHIELD | Attending: Dermatology | Admitting: Dermatology

## 2015-10-26 DIAGNOSIS — Z5321 Procedure and treatment not carried out due to patient leaving prior to being seen by health care provider: Secondary | ICD-10-CM | POA: Diagnosis not present

## 2015-10-26 DIAGNOSIS — R079 Chest pain, unspecified: Secondary | ICD-10-CM | POA: Insufficient documentation

## 2015-10-26 LAB — CBC
HEMATOCRIT: 45.7 % (ref 39.0–52.0)
HEMOGLOBIN: 15.9 g/dL (ref 13.0–17.0)
MCH: 31 pg (ref 26.0–34.0)
MCHC: 34.8 g/dL (ref 30.0–36.0)
MCV: 89.1 fL (ref 78.0–100.0)
Platelets: 131 10*3/uL — ABNORMAL LOW (ref 150–400)
RBC: 5.13 MIL/uL (ref 4.22–5.81)
RDW: 13.2 % (ref 11.5–15.5)
WBC: 6.8 10*3/uL (ref 4.0–10.5)

## 2015-10-26 LAB — I-STAT TROPONIN, ED: Troponin i, poc: 0 ng/mL (ref 0.00–0.08)

## 2015-10-26 LAB — BASIC METABOLIC PANEL
ANION GAP: 8 (ref 5–15)
BUN: 14 mg/dL (ref 6–20)
CALCIUM: 9.6 mg/dL (ref 8.9–10.3)
CO2: 26 mmol/L (ref 22–32)
Chloride: 107 mmol/L (ref 101–111)
Creatinine, Ser: 0.87 mg/dL (ref 0.61–1.24)
GFR calc Af Amer: >60 mL/min (ref >60)
GFR calc non Af Amer: >60 mL/min (ref >60)
GLUCOSE: 163 mg/dL — AB (ref 65–99)
POTASSIUM: 3.8 mmol/L (ref 3.5–5.1)
Sodium: 141 mmol/L (ref 135–145)

## 2015-10-26 NOTE — ED Notes (Signed)
Pt at desk stating he is leaving due to wait and will follow up with his cardiologist tomorrow, encouraged to stay, lab work reviewed

## 2015-10-26 NOTE — Telephone Encounter (Signed)
New Message  Pt c/o of Chest Pain: 1. Are you having CP right now? Yes it has kind of eased off and but it strong this morning   2. Are you experiencing any other symptoms (ex. SOB, nausea, vomiting, sweating)?  SOB  And numb lips, Left arm pain. 3. How long have you been experiencing CP? About 1 week.  4. Is your CP continuous or coming and going? Coming and going  5. Have you taken Nitroglycerin? Had to a few time. Taken the long lasting nitro that was given every morning.  Pt c/o Shortness Of Breath: STAT if SOB developed within the last 24 hours or pt is noticeably SOB on the phone  1. Are you currently SOB (can you hear that pt is SOB on the phone)? No but this morning it was intense  2. How long have you been experiencing SOB? 1 week 3. Are you SOB when sitting or when up moving around? Moving around 4.  Are you currently experiencing any other symptoms? Dull headache

## 2015-10-26 NOTE — ED Notes (Signed)
Pt reports cp every morning for 1 week. Has been taking imdur and has had relief of CP but over last few days CP has been getting worse. Feeling SOB at current, has left sided CP. Has cardiac hx.

## 2015-10-26 NOTE — Telephone Encounter (Signed)
Pt called this am, because he has been having Chest tightness lightheadedness  and left arm pain, SOB with mild exertion, and some time at rest   for a week. Pt states that for some reason his lips feel numb this morning. Pt took her long lasting Isosorbide, and it helps a little. 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 Jorja Loa NP aware of pt's symptoms specially that he has numbness on his lips.Pt is aware of NP's recommendations and advice  to call EMS to take to [place]. Pt states that he is okay to drive hims to the ER. Pt refused to call EMS after letting him  know of the risk his is taking to drive himself.  Spoke Sharrell Ku PA  Which [employer] for [employer]. She is aware of pt arrival  to the ER this AM.

## 2015-10-27 ENCOUNTER — Telehealth: Payer: Self-pay | Admitting: Cardiology

## 2015-10-27 NOTE — Telephone Encounter (Signed)
Error

## 2015-11-06 ENCOUNTER — Ambulatory Visit: Payer: BLUE CROSS/BLUE SHIELD | Admitting: Internal Medicine

## 2015-11-09 ENCOUNTER — Ambulatory Visit (INDEPENDENT_AMBULATORY_CARE_PROVIDER_SITE_OTHER): Payer: BLUE CROSS/BLUE SHIELD | Admitting: Internal Medicine

## 2015-11-09 ENCOUNTER — Encounter: Payer: Self-pay | Admitting: Internal Medicine

## 2015-11-09 ENCOUNTER — Other Ambulatory Visit (INDEPENDENT_AMBULATORY_CARE_PROVIDER_SITE_OTHER): Payer: BLUE CROSS/BLUE SHIELD

## 2015-11-09 VITALS — BP 110/70 | HR 85 | Wt 316.0 lb

## 2015-11-09 DIAGNOSIS — R894 Abnormal immunological findings in specimens from other organs, systems and tissues: Secondary | ICD-10-CM

## 2015-11-09 DIAGNOSIS — R768 Other specified abnormal immunological findings in serum: Secondary | ICD-10-CM | POA: Insufficient documentation

## 2015-11-09 DIAGNOSIS — I10 Essential (primary) hypertension: Secondary | ICD-10-CM

## 2015-11-09 DIAGNOSIS — E785 Hyperlipidemia, unspecified: Secondary | ICD-10-CM

## 2015-11-09 DIAGNOSIS — G4733 Obstructive sleep apnea (adult) (pediatric): Secondary | ICD-10-CM | POA: Diagnosis not present

## 2015-11-09 LAB — HEPATIC FUNCTION PANEL
ALBUMIN: 4.5 g/dL (ref 3.5–5.2)
ALK PHOS: 62 U/L (ref 39–117)
ALT: 38 U/L (ref 0–53)
AST: 20 U/L (ref 0–37)
BILIRUBIN TOTAL: 1.1 mg/dL (ref 0.2–1.2)
Bilirubin, Direct: 0.2 mg/dL (ref 0.0–0.3)
Total Protein: 7.1 g/dL (ref 6.0–8.3)

## 2015-11-09 MED ORDER — ALPRAZOLAM 2 MG PO TABS
2.0000 mg | ORAL_TABLET | Freq: Three times a day (TID) | ORAL | Status: DC | PRN
Start: 2015-11-09 — End: 2016-02-07

## 2015-11-09 MED ORDER — NITROGLYCERIN 0.4 MG SL SUBL
0.4000 mg | SUBLINGUAL_TABLET | SUBLINGUAL | Status: DC | PRN
Start: 2015-11-09 — End: 2016-08-14

## 2015-11-09 MED ORDER — TRAMADOL HCL 50 MG PO TABS: 50.0000 mg | ORAL_TABLET | Freq: Two times a day (BID) | ORAL | Status: DC | PRN

## 2015-11-09 NOTE — Progress Notes (Signed)
Pre visit review using our clinic review tool, if applicable. No additional management support is needed unless otherwise documented below in the visit note. 

## 2015-11-09 NOTE — Progress Notes (Signed)
Subjective:  Patient ID: [name], male    DOB: 04-11-1961  Age: 55 y.o. MRN: [id number]  CC: No chief complaint on file.   HPI Sayf Lanzer presents for CP off and on - went to ER - card appt w/Dr Rohrbeck. F/u LBP, anxiety. F/u Hep C - he has not ben to [facility] yet.  Outpatient Prescriptions Prior to Visit  Medication Sig Dispense Refill  . aspirin EC 81 MG tablet Take 1 tablet (81 mg total) by mouth daily.    [name] atorvastatin (LIPITOR) 20 MG tablet Take 1 tablet (20 mg total) by mouth daily. 90 tablet 3  . cholecalciferol (VITAMIN D) 1000 UNITS tablet Take 1 tablet (1,000 Units total) by mouth daily. 100 tablet 3  . isosorbide mononitrate (IMDUR) 30 MG 24 hr tablet Take 1 tablet (30 mg total) by mouth daily. 90 tablet 3  . ketorolac (TORADOL) 10 MG tablet Take 1 tablet (10 mg total) by mouth every 8 (eight) hours as needed for severe pain (kidney pai). 20 tablet 0  . losartan (COZAAR) 100 MG tablet Take 1 tablet (100 mg total) by mouth daily. 90 tablet 3  . naproxen (NAPROSYN) 500 MG tablet TAKE 1 TABLET BY MOUTH DAILY AS NEEDED 90 tablet 3  . alprazolam (XANAX) 2 MG tablet TAKE 1 TABLET BY MOUTH THREE TIMES DAILY AS NEEDED FOR ANXIETY 90 tablet 1  . nitroGLYCERIN (NITROSTAT) 0.4 MG SL tablet Place 1 tablet (0.4 mg total) under the tongue every 5 (five) minutes as needed. 20 tablet 3  . traMADol (ULTRAM) 50 MG tablet TAKE 1 TABLET BY MOUTH EVERY 12 HOURS AS NEEDED FOR SEVERE PAIN 60 tablet 1  . ciprofloxacin (CIPRO) 500 MG tablet Take 1 tablet (500 mg total) by mouth 2 (two) times daily. (Patient not taking: Reported on 11/09/2015) 20 tablet 0   Facility-Administered Medications Prior to Visit  Medication Dose Route Frequency Provider Last Rate Last Dose  . albuterol (PROVENTIL) (2.5 MG/3ML) 0.083% nebulizer solution 2.5 mg  2.5 mg Nebulization Once Cassandria Anger, MD        ROS Review of Systems  Constitutional: Negative for appetite change,  fatigue and unexpected weight change.  HENT: Negative for congestion, nosebleeds, sneezing, sore throat and trouble swallowing.   Eyes: Negative for itching and visual disturbance.  Respiratory: Negative for cough.   Cardiovascular: Negative for chest pain, palpitations and leg swelling.  Gastrointestinal: Negative for nausea, diarrhea, blood in stool and abdominal distention.  Genitourinary: Negative for frequency and hematuria.  Musculoskeletal: Negative for back pain, joint swelling, gait problem and neck pain.  Skin: Negative for rash.  Neurological: Negative for dizziness, tremors, speech difficulty and weakness.  Psychiatric/Behavioral: Negative for sleep disturbance, dysphoric mood and agitation. The patient is not nervous/anxious.     Objective:  BP 110/70 mmHg  Pulse 85  Wt 316 lb (143.337 kg)  SpO2 95%  BP Readings from Last 3 Encounters:  11/09/15 110/70  10/26/15 138/85  08/09/15 120/60    Wt Readings from Last 3 Encounters:  11/09/15 316 lb (143.337 kg)  08/09/15 313 lb (141.976 kg)  07/26/15 319 lb (144.697 kg)    Physical Exam  Constitutional: He is oriented to person, place, and time. He appears well-developed. No distress.  NAD  HENT:  Mouth/Throat: Oropharynx is clear and moist.  Eyes: Conjunctivae are normal. Pupils are equal, round, and reactive to light.  Neck: Normal range of motion. No JVD present. No thyromegaly present.  Cardiovascular: Normal  rate, regular rhythm, normal heart sounds and intact distal pulses.  Exam reveals no gallop and no friction rub.   No murmur heard. Pulmonary/Chest: Effort normal and breath sounds normal. No respiratory distress. He has no wheezes. He has no rales. He exhibits no tenderness.  Abdominal: Soft. Bowel sounds are normal. He exhibits no distension and no mass. There is no tenderness. There is no rebound and no guarding.  Musculoskeletal: Normal range of motion. He exhibits no edema or tenderness.  Lymphadenopathy:     He has no cervical adenopathy.  Neurological: He is alert and oriented to person, place, and time. He has normal reflexes. No cranial nerve deficit. He exhibits normal muscle tone. He displays a negative Romberg sign. Coordination and gait normal.  Skin: Skin is warm and dry. No rash noted.  Psychiatric: He has a normal mood and affect. His behavior is normal. Judgment and thought content normal.  Obese  Lab Results  Component Value Date   WBC 6.8 10/26/2015   HGB 15.9 10/26/2015   HCT 45.7 10/26/2015   PLT 131* 10/26/2015   GLUCOSE 163* 10/26/2015   CHOL 126 10/18/2013   TRIG 376.0* 10/18/2013   HDL 36.00* 10/18/2013   LDLDIRECT 48.0 05/28/2012   LDLCALC 15 10/18/2013   ALT 31 07/17/2014   AST 27 07/17/2014   NA 141 10/26/2015   K 3.8 10/26/2015   CL 107 10/26/2015   CREATININE 0.87 10/26/2015   BUN 14 10/26/2015   CO2 26 10/26/2015   TSH 2.08 05/28/2012   PSA 0.33 02/02/2015   INR 0.9 11/16/2013   HGBA1C 5.8 01/18/2015    Dg Chest 2 View  10/26/2015  CLINICAL DATA:  Left chest pain with left arm numbness x 1 week, clamminess, SOB x 2 days, MI x 2 (last one 20 yrs), Left chest pain, dizziness, left arm, SOB, and nausea today even after nitro. Hx HTN, cardiac catheterization x 2, and 2 ppd smoker x 45 yrs. EXAM: CHEST  2 VIEW COMPARISON:  07/19/2014 FINDINGS: Heart size is normal. Minimal subsegmental atelectasis at the right lung base. No focal consolidations pleural effusions. No pulmonary edema. Mid thoracic spondylosis. IMPRESSION: Minimal right lower lobe atelectasis. Electronically Signed   By: Nolon Nations M.D.   On: 10/26/2015 17:09    Assessment & Plan:   There are no diagnoses linked to this encounter. I have discontinued Mr. Burno ciprofloxacin. I have also changed his traMADol and alprazolam. Additionally, I am having him maintain his aspirin EC, isosorbide mononitrate, atorvastatin, losartan, naproxen, cholecalciferol, ketorolac, and nitroGLYCERIN.  We will continue to administer albuterol.  Meds ordered this encounter  Medications  . nitroGLYCERIN (NITROSTAT) 0.4 MG SL tablet    Sig: Place 1 tablet (0.4 mg total) under the tongue every 5 (five) minutes as needed.    Dispense:  20 tablet    Refill:  3  . traMADol (ULTRAM) 50 MG tablet    Sig: Take 1 tablet (50 mg total) by mouth every 12 (twelve) hours as needed.    Dispense:  60 tablet    Refill:  2  . alprazolam (XANAX) 2 MG tablet    Sig: Take 1 tablet (2 mg total) by mouth 3 (three) times daily as needed. for anxiety    Dispense:  90 tablet    Refill:  2     Follow-up: Return for a follow-up visit.  Walker Kehr, MD

## 2015-11-09 NOTE — Assessment & Plan Note (Signed)
[facility] ref is in the process

## 2015-11-09 NOTE — Assessment & Plan Note (Signed)
Losartan 

## 2015-11-09 NOTE — Assessment & Plan Note (Signed)
On Lipitor 

## 2015-11-09 NOTE — Assessment & Plan Note (Signed)
pt is compliant with CPAP and has been using and benefiting from CPAP therapy Needs a pulm ref - new CPAP

## 2015-11-10 ENCOUNTER — Ambulatory Visit: Payer: BLUE CROSS/BLUE SHIELD | Admitting: Cardiology

## 2016-02-06 ENCOUNTER — Encounter: Payer: Self-pay | Admitting: Pulmonary Disease

## 2016-02-06 ENCOUNTER — Ambulatory Visit (INDEPENDENT_AMBULATORY_CARE_PROVIDER_SITE_OTHER): Payer: BLUE CROSS/BLUE SHIELD | Admitting: Pulmonary Disease

## 2016-02-06 VITALS — BP 138/78 | HR 74 | Ht 72.0 in | Wt 318.4 lb

## 2016-02-06 DIAGNOSIS — G4733 Obstructive sleep apnea (adult) (pediatric): Secondary | ICD-10-CM | POA: Diagnosis not present

## 2016-02-06 DIAGNOSIS — Z6841 Body Mass Index (BMI) 40.0 and over, adult: Secondary | ICD-10-CM

## 2016-02-06 DIAGNOSIS — Z23 Encounter for immunization: Secondary | ICD-10-CM

## 2016-02-06 NOTE — Progress Notes (Signed)
   Subjective:    Patient ID: [name], male    DOB: 09-14-1960, 55 y.o.   MRN: [id number]  HPI    Review of Systems  Constitutional: Negative for fever and unexpected weight change.  HENT: Positive for sneezing. Negative for congestion, dental problem, ear pain, nosebleeds, postnasal drip, rhinorrhea, sinus pressure, sore throat and trouble swallowing.   Eyes: Negative for redness and itching.  Respiratory: Positive for shortness of breath. Negative for cough, chest tightness and wheezing.   Cardiovascular: Positive for chest pain. Negative for palpitations and leg swelling.  Gastrointestinal: Negative for nausea and vomiting.  Genitourinary: Negative for dysuria.  Musculoskeletal: Negative for joint swelling.  Skin: Negative for rash.  Neurological: Negative for headaches.  Hematological: Does not bruise/bleed easily.  Psychiatric/Behavioral: Negative for dysphoric mood. The patient is not nervous/anxious.        Objective:   Physical Exam        Assessment & Plan:

## 2016-02-06 NOTE — Patient Instructions (Signed)
Will arrange for CPAP titration study  Will schedule follow up after CPAP titration study reviewed

## 2016-02-06 NOTE — Progress Notes (Signed)
Past Surgical History He  has a past surgical history that includes Fracture surgery; Fracture surgery; Back surgery; and left heart catheterization with coronary angiogram (N/A, 11/22/2013).  Allergies  Allergen Reactions  . Ace Inhibitors   . Lisinopril     cough    Family History His family history includes Atrial fibrillation in his other; Cancer in his mother; Heart attack (age of onset: 61) in his father; Heart disease in his father.  Social History He  reports that he has been smoking Cigarettes.  He has a 50.00 pack-year smoking history. He has never used smokeless tobacco. He reports that he drinks about 3.6 oz of alcohol per week . He reports that he uses drugs, including Marijuana.  Review of systems Constitutional: Negative for fever and unexpected weight change.  HENT: Positive for sneezing. Negative for congestion, dental problem, ear pain, nosebleeds, postnasal drip, rhinorrhea, sinus pressure, sore throat and trouble swallowing.   Eyes: Negative for redness and itching.  Respiratory: Positive for shortness of breath. Negative for cough, chest tightness and wheezing.   Cardiovascular: Positive for chest pain. Negative for palpitations and leg swelling.  Gastrointestinal: Negative for nausea and vomiting.  Genitourinary: Negative for dysuria.  Musculoskeletal: Negative for joint swelling.  Skin: Negative for rash.  Neurological: Negative for headaches.  Hematological: Does not bruise/bleed easily.  Psychiatric/Behavioral: Negative for dysphoric mood. The patient is not nervous/anxious.     Current Outpatient Prescriptions on File Prior to Visit  Medication Sig  . alprazolam (XANAX) 2 MG tablet Take 1 tablet (2 mg total) by mouth 3 (three) times daily as needed. for anxiety (Patient taking differently: Take 2 mg by mouth as needed. for anxiety)  . aspirin EC 81 MG tablet Take 1 tablet (81 mg total) by mouth daily.  [name] atorvastatin (LIPITOR) 20 MG tablet Take 1 tablet (20  mg total) by mouth daily.  . isosorbide mononitrate (IMDUR) 30 MG 24 hr tablet Take 1 tablet (30 mg total) by mouth daily.  [name] losartan (COZAAR) 100 MG tablet Take 1 tablet (100 mg total) by mouth daily.  . naproxen (NAPROSYN) 500 MG tablet TAKE 1 TABLET BY MOUTH DAILY AS NEEDED  . nitroGLYCERIN (NITROSTAT) 0.4 MG SL tablet Place 1 tablet (0.4 mg total) under the tongue every 5 (five) minutes as needed.  . traMADol (ULTRAM) 50 MG tablet Take 1 tablet (50 mg total) by mouth every 12 (twelve) hours as needed.   Current Facility-Administered Medications on File Prior to Visit  Medication  . albuterol (PROVENTIL) (2.5 MG/3ML) 0.083% nebulizer solution 2.5 mg    Chief Complaint  Patient presents with  . SLEEP CONSULT    Referred by Dr Alain Marion. Requests new BiPAP machine - current machine is about 54 years old. Not holding pressure and humidifier is not heating water properly. Sleep Study x 10 years ago. Current BiPAP set at 21/21. Epworth Score: 13; DME: [facility]    Sleep tests PSG 09/08/07 >> AHI 119, SpO2 low 37%  Cardiac tests Echo 01/10/10 >> EF 55%, mild LVH  Past medical history He  has a past medical history of Anxiety; CAD (coronary artery disease); Cocaine abuse; Diabetes mellitus; GERD (gastroesophageal reflux disease); Hyperlipidemia; Myocardial infarction ([place]) ("distant past"); Obesity, unspecified; OSA (obstructive sleep apnea); Pain, low back; and Tobacco use disorder.  Vital signs BP 138/78 (BP Location: Left Arm, Cuff Size: Normal)   Pulse 74   Ht 6' (1.829 m)   Wt (!) 318 lb 6.4 oz (144.4 kg)   SpO2 94%  BMI 43.18 kg/m   History of Present Illness [name] is a 55 y.o. male for evaluation of sleep problems.  I saw him in 2009 to 2011 for severe OSA.  He has been on CPAP and BiPAP.  Reports he has been using BiPAP 21/21.  His machine is very old, and he doesn't think it works any more.  He is getting dry mouth and film build up.  He is feeling more tired  during the day.  He goes to sleep at 9 pm.  He falls asleep 15 minutes.  He wakes up 2 to 3 times to use the bathroom.  He gets out of bed at 5 am.  He feels tired in the morning.  He denies morning headache.  He does not use anything to help him fall sleep or stay awake.  He denies sleep walking, sleep talking, bruxism, or nightmares.  There is no history of restless legs.  He denies sleep hallucinations, sleep paralysis, or cataplexy.  The Epworth score is 13 out of 24.   Physical Exam:  General - No distress ENT - No sinus tenderness, no oral exudate, no LAN, no thyromegaly, TM clear, pupils equal/reactive, MP 3 Cardiac - s1s2 regular, no murmur, pulses symmetric Chest - No wheeze/rales/dullness, good air entry, normal respiratory excursion Back - No focal tenderness Abd - Soft, non-tender, no organomegaly, + bowel sounds Ext - No edema Neuro - Normal strength, cranial nerves intact Skin - No rashes Psych - Normal mood, and behavior  Discussion: He has history of severe obstructive sleep apnea.  He has been compliant with CPAP/BiPAP.  I am concerned his current device is no longer working.  He has been on very high pressure settings, but some of this could be related to machine not working properly.  Assessment/plan:  Obstructive sleep apnea. - will arrange for in lab CPAP titration study and then arrange for new machine and equipment  Obesity. - discussed importance of weight loss   Patient Instructions  Will arrange for CPAP titration study  Will schedule follow up after CPAP titration study reviewed    Chesley Mires, M.D. Pager [phone] 02/06/2016, 12:21 PM

## 2016-02-07 ENCOUNTER — Ambulatory Visit (INDEPENDENT_AMBULATORY_CARE_PROVIDER_SITE_OTHER): Payer: BLUE CROSS/BLUE SHIELD | Admitting: Internal Medicine

## 2016-02-07 ENCOUNTER — Encounter: Payer: Self-pay | Admitting: Internal Medicine

## 2016-02-07 DIAGNOSIS — F411 Generalized anxiety disorder: Secondary | ICD-10-CM

## 2016-02-07 DIAGNOSIS — E785 Hyperlipidemia, unspecified: Secondary | ICD-10-CM | POA: Diagnosis not present

## 2016-02-07 DIAGNOSIS — M5416 Radiculopathy, lumbar region: Secondary | ICD-10-CM | POA: Diagnosis not present

## 2016-02-07 DIAGNOSIS — I25119 Atherosclerotic heart disease of native coronary artery with unspecified angina pectoris: Secondary | ICD-10-CM

## 2016-02-07 DIAGNOSIS — I10 Essential (primary) hypertension: Secondary | ICD-10-CM | POA: Diagnosis not present

## 2016-02-07 MED ORDER — ALPRAZOLAM 2 MG PO TABS: 2.0000 mg | ORAL_TABLET | Freq: Three times a day (TID) | ORAL | 1 refills | Status: DC | PRN

## 2016-02-07 MED ORDER — TRAMADOL HCL 50 MG PO TABS: 50.0000 mg | ORAL_TABLET | Freq: Two times a day (BID) | ORAL | 2 refills | Status: DC | PRN

## 2016-02-07 MED ORDER — LOSARTAN POTASSIUM 100 MG PO TABS: 100.0000 mg | ORAL_TABLET | Freq: Every day | ORAL | 3 refills | Status: DC

## 2016-02-07 MED ORDER — ATORVASTATIN CALCIUM 20 MG PO TABS: 20.0000 mg | ORAL_TABLET | Freq: Every day | ORAL | 3 refills | Status: DC

## 2016-02-07 NOTE — Assessment & Plan Note (Signed)
He just had a cath - better - less blockage

## 2016-02-07 NOTE — Progress Notes (Signed)
Subjective:  Patient ID: [name], male    DOB: [birth year]  Age: 55 y.o. MRN: [id number]  CC: No chief complaint on file.   HPI [name] presents for CAD, LBP, HTN. He had another cath - better than before.  Outpatient Medications Prior to Visit  Medication Sig Dispense Refill  . alprazolam (XANAX) 2 MG tablet Take 1 tablet (2 mg total) by mouth 3 (three) times daily as needed. for anxiety (Patient taking differently: Take 2 mg by mouth as needed. for anxiety) 90 tablet 2  . aspirin EC 81 MG tablet Take 1 tablet (81 mg total) by mouth daily.    [name] atorvastatin (LIPITOR) 20 MG tablet Take 1 tablet (20 mg total) by mouth daily. 90 tablet 3  . isosorbide mononitrate (IMDUR) 30 MG 24 hr tablet Take 1 tablet (30 mg total) by mouth daily. 90 tablet 3  . losartan (COZAAR) 100 MG tablet Take 1 tablet (100 mg total) by mouth daily. 90 tablet 3  . naproxen (NAPROSYN) 500 MG tablet TAKE 1 TABLET BY MOUTH DAILY AS NEEDED 90 tablet 3  . nitroGLYCERIN (NITROSTAT) 0.4 MG SL tablet Place 1 tablet (0.4 mg total) under the tongue every 5 (five) minutes as needed. 20 tablet 3  . traMADol (ULTRAM) 50 MG tablet Take 1 tablet (50 mg total) by mouth every 12 (twelve) hours as needed. 60 tablet 2   Facility-Administered Medications Prior to Visit  Medication Dose Route Frequency Provider Last Rate Last Dose  . albuterol (PROVENTIL) (2.5 MG/3ML) 0.083% nebulizer solution 2.5 mg  2.5 mg Nebulization Once Cassandria Anger, MD        ROS Review of Systems  Constitutional: Negative for appetite change, fatigue and unexpected weight change.  HENT: Negative for congestion, nosebleeds, sneezing, sore throat and trouble swallowing.   Eyes: Negative for itching and visual disturbance.  Respiratory: Negative for cough.   Cardiovascular: Negative for chest pain, palpitations and leg swelling.  Gastrointestinal: Negative for abdominal distention, blood in stool, diarrhea and nausea.   Genitourinary: Negative for frequency and hematuria.  Musculoskeletal: Positive for back pain. Negative for gait problem, joint swelling and neck pain.  Skin: Negative for rash.  Neurological: Negative for dizziness, tremors, speech difficulty and weakness.  Psychiatric/Behavioral: Negative for agitation, dysphoric mood and sleep disturbance. The patient is nervous/anxious.     Objective:  BP 110/78   Pulse 86   Temp 98.4 F (36.9 C) (Oral)   Wt (!) 318 lb (144.2 kg)   SpO2 97%   BMI 43.13 kg/m   BP Readings from Last 3 Encounters:  02/07/16 110/78  02/06/16 138/78  11/09/15 110/70    Wt Readings from Last 3 Encounters:  02/07/16 (!) 318 lb (144.2 kg)  02/06/16 (!) 318 lb 6.4 oz (144.4 kg)  11/09/15 (!) 316 lb (143.3 kg)    Physical Exam  Constitutional: He is oriented to person, place, and time. He appears well-developed. No distress.  NAD  HENT:  Mouth/Throat: Oropharynx is clear and moist.  Eyes: Conjunctivae are normal. Pupils are equal, round, and reactive to light.  Neck: Normal range of motion. No JVD present. No thyromegaly present.  Cardiovascular: Normal rate, regular rhythm, normal heart sounds and intact distal pulses.  Exam reveals no gallop and no friction rub.   No murmur heard. Pulmonary/Chest: Effort normal and breath sounds normal. No respiratory distress. He has no wheezes. He has no rales. He exhibits no tenderness.  Abdominal: Soft. Bowel sounds are normal. He  exhibits no distension and no mass. There is no tenderness. There is no rebound and no guarding.  Musculoskeletal: Normal range of motion. He exhibits tenderness. He exhibits no edema.  Lymphadenopathy:    He has no cervical adenopathy.  Neurological: He is alert and oriented to person, place, and time. He has normal reflexes. No cranial nerve deficit. He exhibits normal muscle tone. He displays a negative Romberg sign. Coordination and gait normal.  Skin: Skin is warm and dry. No rash noted.    Psychiatric: He has a normal mood and affect. His behavior is normal. Judgment and thought content normal.  Obese  Lab Results  Component Value Date   WBC 6.8 10/26/2015   HGB 15.9 10/26/2015   HCT 45.7 10/26/2015   PLT 131 (L) 10/26/2015   GLUCOSE 163 (H) 10/26/2015   CHOL 126 10/18/2013   TRIG 376.0 (H) 10/18/2013   HDL 36.00 (L) 10/18/2013   LDLDIRECT 48.0 05/28/2012   LDLCALC 15 10/18/2013   ALT 38 11/09/2015   AST 20 11/09/2015   NA 141 10/26/2015   K 3.8 10/26/2015   CL 107 10/26/2015   CREATININE 0.87 10/26/2015   BUN 14 10/26/2015   CO2 26 10/26/2015   TSH 2.08 05/28/2012   PSA 0.33 02/02/2015   INR 0.9 11/16/2013   HGBA1C 5.8 01/18/2015    Dg Chest 2 View  Result Date: 10/26/2015 CLINICAL DATA:  Left chest pain with left arm numbness x 1 week, clamminess, SOB x 2 days, MI x 2 (last one 20 yrs), Left chest pain, dizziness, left arm, SOB, and nausea today even after nitro. Hx HTN, cardiac catheterization x 2, and 2 ppd smoker x 45 yrs. EXAM: CHEST  2 VIEW COMPARISON:  07/19/2014 FINDINGS: Heart size is normal. Minimal subsegmental atelectasis at the right lung base. No focal consolidations pleural effusions. No pulmonary edema. Mid thoracic spondylosis. IMPRESSION: Minimal right lower lobe atelectasis. Electronically Signed   By: Nolon Nations M.D.   On: 10/26/2015 17:09    Assessment & Plan:   There are no diagnoses linked to this encounter. I am having [name] maintain his aspirin EC, isosorbide mononitrate, atorvastatin, losartan, naproxen, nitroGLYCERIN, traMADol, and alprazolam. We will continue to administer albuterol.  No orders of the defined types were placed in this encounter.    Follow-up: No Follow-up on file.  Walker Kehr, MD

## 2016-02-07 NOTE — Assessment & Plan Note (Signed)
Losartan 

## 2016-02-07 NOTE — Assessment & Plan Note (Signed)
Tramadol prn 

## 2016-02-07 NOTE — Assessment & Plan Note (Signed)
Alprazolam prn - we are reducing Xanax use  Potential benefits of a long term benzodiazepines  use as well as potential risks  and complications were explained to the patient and were aknowledged.

## 2016-02-07 NOTE — Assessment & Plan Note (Signed)
On Lipitor 

## 2016-02-07 NOTE — Progress Notes (Signed)
Pre visit review using our clinic review tool, if applicable. No additional management support is needed unless otherwise documented below in the visit note. 

## 2016-03-17 ENCOUNTER — Ambulatory Visit (HOSPITAL_BASED_OUTPATIENT_CLINIC_OR_DEPARTMENT_OTHER): Payer: BLUE CROSS/BLUE SHIELD | Attending: Pulmonary Disease | Admitting: Pulmonary Disease

## 2016-03-17 VITALS — Ht 72.0 in | Wt 320.0 lb

## 2016-03-17 DIAGNOSIS — G4733 Obstructive sleep apnea (adult) (pediatric): Secondary | ICD-10-CM | POA: Diagnosis present

## 2016-03-20 ENCOUNTER — Telehealth: Payer: Self-pay | Admitting: Pulmonary Disease

## 2016-03-20 NOTE — Telephone Encounter (Signed)
Pt is calling about the sleep study results.  He stated that his cpap machine is not working correctly and he stated that he is waking up gasping at night due to the poor quality of the cpap machine that he has. Pt is wanting to know if [facility] has the results of the sleep study yet.  VS please advise. thanks

## 2016-03-21 ENCOUNTER — Other Ambulatory Visit: Payer: Self-pay | Admitting: Internal Medicine

## 2016-03-21 NOTE — Telephone Encounter (Signed)
Spoke with pt. He is aware of [facility] response. Will await sleep study results.

## 2016-03-21 NOTE — Telephone Encounter (Signed)
Please let him know his sleep study is still be processed at the sleep lab.  I will call him with results as soon as these are available.

## 2016-03-21 NOTE — Telephone Encounter (Signed)
Routing to dr plotnikov, please advise, thanks 

## 2016-03-22 NOTE — Telephone Encounter (Signed)
Xanax refill called into pharm

## 2016-03-26 ENCOUNTER — Telehealth: Payer: Self-pay | Admitting: Pulmonary Disease

## 2016-03-26 DIAGNOSIS — G4733 Obstructive sleep apnea (adult) (pediatric): Secondary | ICD-10-CM

## 2016-03-26 NOTE — Telephone Encounter (Signed)
Bipap titration 03/17/16 >> Bipap 22/18 cm H2O >> AHI 0   Will have my nurse inform pt that study showed optimal setting to start with is Bipap 22/18 cm H2O.  Please arrange for Bipap set up with heated humidity and mask of choice.  Please arrange for ROV with me or NP 2 months after getting new Bipap.

## 2016-03-26 NOTE — Procedures (Signed)
   Patient Name: [name], [name] Date: 03/17/2016 Gender: Male D.O.B: [birth year] Age (years): 71 Referring Provider: Chesley Mires MD, ABSM Height (inches): 70 Interpreting Physician: Chesley Mires MD, ABSM Weight (lbs): 320 RPSGT: Baxter Flattery BMI: 65 MRN: [id number] Neck Size: 22.00  CLINICAL INFORMATION The patient is referred for a BiPAP titration to treat sleep apnea.   Date of NPSG:  PSG 09/08/07 >> AHI 119, SpO2 low 37%.  SLEEP STUDY TECHNIQUE As per the AASM Manual for the Scoring of Sleep and Associated Events v2.3 (April 2016) with a hypopnea requiring 4% desaturations. The channels recorded and monitored were frontal, central and occipital EEG, electrooculogram (EOG), submentalis EMG (chin), nasal and oral airflow, thoracic and abdominal wall motion, anterior tibialis EMG, snore microphone, electrocardiogram, and pulse oximetry. Bilevel positive airway pressure (BPAP) was initiated at the beginning of the study and titrated to treat sleep-disordered breathing.  MEDICATIONS Medications self-administered by patient taken the night of the study : N/A  RESPIRATORY PARAMETERS Optimal IPAP Pressure (cm): 22 AHI at Optimal Pressure (/hr) 0.0 Optimal EPAP Pressure (cm): 18   Overall Minimal O2 (%): 78.00 Minimal O2 at Optimal Pressure (%): 88.0  SLEEP ARCHITECTURE Start Time: 9:37:22 PM Stop Time: 4:18:44 AM Total Time (min): 401.4 Total Sleep Time (min): 375.5 Sleep Latency (min): 4.4 Sleep Efficiency (%): 93.5 REM Latency (min): 141.0 WASO (min): 21.5 Stage N1 (%): 19.44 Stage N2 (%): 71.91 Stage N3 (%): 0.00 Stage R (%): 8.65 Supine (%): 0.00 Arousal Index (/hr): 59.8      CARDIAC DATA The 2 lead EKG demonstrated sinus rhythm. The mean heart rate was 66.64 beats per minute. Other EKG findings include: None.  LEG MOVEMENT DATA The total Periodic Limb Movements of Sleep (PLMS) were 1. The PLMS index was 0.16. A PLMS index of <15 is considered normal in  adults.  IMPRESSIONS - This was a difficult titration study.  He continued to have respiratory events while using CPAP.  These were improved once he was on BiPAP 22/18 cm H2O.  At this pressure he did not need supplemental oxygen.  DIAGNOSIS - Obstructive Sleep Apnea (327.23 [G47.33 ICD-10])  RECOMMENDATIONS - Trial of BiPAP therapy on 22/18 cm H2O - He was fitted with a Large size Resmed Full Face Mask AirFit F20 mask and heated humidification.  [Electronically signed] 03/26/2016 12:29 PM  Chesley Mires MD, [place], American Board of Sleep Medicine   NPI: [id number]

## 2016-04-05 NOTE — Telephone Encounter (Signed)
Results have been explained to patient, pt expressed understanding.  Pt to call back once he get BiPAP to schedule appt.  Bpap order placed.  Nothing further needed.

## 2016-04-19 ENCOUNTER — Telehealth: Payer: Self-pay | Admitting: Internal Medicine

## 2016-04-19 ENCOUNTER — Other Ambulatory Visit: Payer: Self-pay | Admitting: Internal Medicine

## 2016-04-19 NOTE — Telephone Encounter (Signed)
[facility]  Patient Name: [name]  DOB: [birth year]    Initial Comment Caller states he got choked on spit yesterday, coughed all air out, had a mild seizure. Feeling dizzy.   Nurse Assessment  Nurse: Wynetta Emery, RN, Baker Janus Date/Time Eilene Ghazi Time): 04/19/2016 8:49:41 AM  Confirm and document reason for call. If symptomatic, describe symptoms. 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 ---Yes  Will a triage be completed? ---Yes  Related visit to physician within the last 2 weeks? ---No  Does the PT have any chronic conditions? (i.e. diabetes, asthma, etc.) ---Unknown  Is this a behavioral health or substance abuse call? ---No    Nurse: Wynetta Emery, RN, Baker Janus Date/Time (Eastern Time): 04/19/2016 8:59:55 AM  Does the patient have any new or worsening symptoms? ---No      Guidelines    Guideline Title Affirmed Question Affirmed Notes  Choking - Inhaled Foreign Body Coughing or other airway symptoms return    Final Disposition User   See Physician within 4 Hours (or PCP triage) Wynetta Emery, RN, Baker Janus    Comments  NOTE wants appt with Dr. Alain Marion would not see anyone else -- only available appt is 04-24-16 11am given that appt.   Referrals  REFERRED TO PCP OFFICE   Disagree/Comply: Comply

## 2016-04-22 ENCOUNTER — Other Ambulatory Visit: Payer: Self-pay

## 2016-04-22 ENCOUNTER — Telehealth: Payer: Self-pay | Admitting: Pulmonary Disease

## 2016-04-22 DIAGNOSIS — G4733 Obstructive sleep apnea (adult) (pediatric): Secondary | ICD-10-CM

## 2016-04-22 NOTE — Telephone Encounter (Signed)
Patient returned phone call.[name] ° °

## 2016-04-22 NOTE — Telephone Encounter (Signed)
Please have his DME change his Bipap to 16/12 cm H2O.  He is to call if he is still having trouble after pressure change.

## 2016-04-22 NOTE — Telephone Encounter (Signed)
Contacted the pt. And informed him of VS message the order was placed to change the pressure. Nothing further is needed at this time.

## 2016-04-22 NOTE — Telephone Encounter (Addendum)
Has it been taken care of? Thx 

## 2016-04-22 NOTE — Telephone Encounter (Signed)
Pt states his old machine was set on a pressure of 12, pt new machine is set on a pressure of max 22 min 18. Pt reports the pressure is too strong, pt feels mask is leaking as well, as his machine is showing a red sad face every morning. Pt reports that he is unable to sleep with current pressure and would like a lower pressure.  VS please advise. Thanks.

## 2016-04-23 ENCOUNTER — Other Ambulatory Visit: Payer: Self-pay | Admitting: *Deleted

## 2016-04-23 MED ORDER — ISOSORBIDE MONONITRATE ER 30 MG PO TB24: 30.0000 mg | ORAL_TABLET | Freq: Every day | ORAL | 2 refills | Status: DC

## 2016-04-24 ENCOUNTER — Other Ambulatory Visit: Payer: BLUE CROSS/BLUE SHIELD

## 2016-04-24 ENCOUNTER — Ambulatory Visit (INDEPENDENT_AMBULATORY_CARE_PROVIDER_SITE_OTHER): Payer: BLUE CROSS/BLUE SHIELD | Admitting: Internal Medicine

## 2016-04-24 ENCOUNTER — Telehealth: Payer: Self-pay | Admitting: *Deleted

## 2016-04-24 ENCOUNTER — Encounter: Payer: Self-pay | Admitting: Internal Medicine

## 2016-04-24 VITALS — BP 104/80 | HR 99 | Wt 323.0 lb

## 2016-04-24 DIAGNOSIS — R768 Other specified abnormal immunological findings in serum: Secondary | ICD-10-CM

## 2016-04-24 DIAGNOSIS — R569 Unspecified convulsions: Secondary | ICD-10-CM | POA: Insufficient documentation

## 2016-04-24 DIAGNOSIS — G4733 Obstructive sleep apnea (adult) (pediatric): Secondary | ICD-10-CM

## 2016-04-24 MED ORDER — BUDESONIDE-FORMOTEROL FUMARATE 160-4.5 MCG/ACT IN AERO: 2.0000 | INHALATION_SPRAY | Freq: Two times a day (BID) | RESPIRATORY_TRACT | 6 refills | Status: DC

## 2016-04-24 MED ORDER — HYDROCOD POLST-CPM POLST ER 10-8 MG/5ML PO SUER: 5.0000 mL | Freq: Two times a day (BID) | ORAL | 0 refills | Status: DC | PRN

## 2016-04-24 MED ORDER — GABAPENTIN 300 MG PO CAPS: 300.0000 mg | ORAL_CAPSULE | Freq: Three times a day (TID) | ORAL | 5 refills | Status: DC

## 2016-04-24 NOTE — Assessment & Plan Note (Signed)
Will re-refer to [facility]

## 2016-04-24 NOTE — Progress Notes (Signed)
Pre visit review using our clinic review tool, if applicable. No additional management support is needed unless otherwise documented below in the visit note. 

## 2016-04-24 NOTE — Progress Notes (Signed)
Subjective:  Patient ID: [name], male    DOB: December 30, 1960  Age: 55 y.o. MRN: [id number]  CC: Headache; Dizziness; Shortness of Breath ("choking" sensation); and Seizures (2 episodes. One on Thanksgiving and one last Thursday. He states he did not lose consciousness, but he was alone both times. )   HPI [name] presents for a couple seizure episodes both related to his coughing spell: twitching - "kinda passed out" for a couple seconds. Lat episode last Thursday and one on the Thanksgiving day.   He has a h/o epilepsy as a child and was on Phenobarbital - stopped it at 55 yo.  Outpatient Medications Prior to Visit  Medication Sig Dispense Refill  . alprazolam (XANAX) 2 MG tablet TAKE 1 TABLET BY MOUTH THREE TIMES DAILY AS NEEDED FOR ANXIETY 90 tablet 1  . aspirin EC 81 MG tablet Take 1 tablet (81 mg total) by mouth daily.    [name] atorvastatin (LIPITOR) 20 MG tablet Take 1 tablet (20 mg total) by mouth daily. 90 tablet 3  . isosorbide mononitrate (IMDUR) 30 MG 24 hr tablet Take 1 tablet (30 mg total) by mouth daily. 90 tablet 2  . losartan (COZAAR) 100 MG tablet Take 1 tablet (100 mg total) by mouth daily. 90 tablet 3  . losartan (COZAAR) 100 MG tablet TAKE 1 TABLET BY MOUTH DAILY 90 tablet 0  . naproxen (NAPROSYN) 500 MG tablet TAKE 1 TABLET BY MOUTH DAILY AS NEEDED 90 tablet 0  . nitroGLYCERIN (NITROSTAT) 0.4 MG SL tablet Place 1 tablet (0.4 mg total) under the tongue every 5 (five) minutes as needed. 20 tablet 3  . traMADol (ULTRAM) 50 MG tablet Take 1 tablet (50 mg total) by mouth every 12 (twelve) hours as needed. 60 tablet 2  . atorvastatin (LIPITOR) 20 MG tablet TAKE 1 TABLET BY MOUTH DAILY 90 tablet 0   Facility-Administered Medications Prior to Visit  Medication Dose Route Frequency Provider Last Rate Last Dose  . albuterol (PROVENTIL) (2.5 MG/3ML) 0.083% nebulizer solution 2.5 mg  2.5 mg Nebulization Once Cassandria Anger, MD         ROS Review of Systems  Constitutional: Negative for appetite change, fatigue and unexpected weight change.  HENT: Negative for congestion, nosebleeds, sneezing, sore throat and trouble swallowing.   Eyes: Negative for itching and visual disturbance.  Respiratory: Positive for cough.   Cardiovascular: Negative for chest pain, palpitations and leg swelling.  Gastrointestinal: Negative for abdominal distention, blood in stool, diarrhea and nausea.  Genitourinary: Negative for frequency and hematuria.  Musculoskeletal: Negative for back pain, gait problem, joint swelling and neck pain.  Skin: Negative for rash.  Neurological: Positive for seizures. Negative for dizziness, tremors, speech difficulty and weakness.  Psychiatric/Behavioral: Negative for agitation, dysphoric mood and sleep disturbance. The patient is not nervous/anxious.     Objective:  BP 104/80   Pulse 99   Wt (!) 323 lb (146.5 kg)   SpO2 96%   BMI 43.81 kg/m   BP Readings from Last 3 Encounters:  04/24/16 104/80  02/07/16 110/78  02/06/16 138/78    Wt Readings from Last 3 Encounters:  04/24/16 (!) 323 lb (146.5 kg)  03/17/16 (!) 320 lb (145.2 kg)  02/07/16 (!) 318 lb (144.2 kg)    Physical Exam  Constitutional: He is oriented to person, place, and time. He appears well-developed. No distress.  NAD  HENT:  Mouth/Throat: Oropharynx is clear and moist.  Eyes: Conjunctivae are normal. Pupils are equal, round,  and reactive to light.  Neck: Normal range of motion. No JVD present. No thyromegaly present.  Cardiovascular: Normal rate, regular rhythm, normal heart sounds and intact distal pulses.  Exam reveals no gallop and no friction rub.   No murmur heard. Pulmonary/Chest: Effort normal and breath sounds normal. No respiratory distress. He has no wheezes. He has no rales. He exhibits no tenderness.  Abdominal: Soft. Bowel sounds are normal. He exhibits no distension and no mass. There is no tenderness. There is  no rebound and no guarding.  Musculoskeletal: Normal range of motion. He exhibits tenderness. He exhibits no edema.  Lymphadenopathy:    He has no cervical adenopathy.  Neurological: He is alert and oriented to person, place, and time. He has normal reflexes. No cranial nerve deficit. He exhibits normal muscle tone. He displays a negative Romberg sign. Coordination and gait normal.  Skin: Skin is warm and dry. No rash noted.  Psychiatric: He has a normal mood and affect. His behavior is normal. Judgment and thought content normal.  Obese  Lab Results  Component Value Date   WBC 6.8 10/26/2015   HGB 15.9 10/26/2015   HCT 45.7 10/26/2015   PLT 131 (L) 10/26/2015   GLUCOSE 163 (H) 10/26/2015   CHOL 126 10/18/2013   TRIG 376.0 (H) 10/18/2013   HDL 36.00 (L) 10/18/2013   LDLDIRECT 48.0 05/28/2012   LDLCALC 15 10/18/2013   ALT 38 11/09/2015   AST 20 11/09/2015   NA 141 10/26/2015   K 3.8 10/26/2015   CL 107 10/26/2015   CREATININE 0.87 10/26/2015   BUN 14 10/26/2015   CO2 26 10/26/2015   TSH 2.08 05/28/2012   PSA 0.33 02/02/2015   INR 0.9 11/16/2013   HGBA1C 5.8 01/18/2015    Dg Chest 2 View  Result Date: 10/26/2015 CLINICAL DATA:  Left chest pain with left arm numbness x 1 week, clamminess, SOB x 2 days, MI x 2 (last one 20 yrs), Left chest pain, dizziness, left arm, SOB, and nausea today even after nitro. Hx HTN, cardiac catheterization x 2, and 2 ppd smoker x 45 yrs. EXAM: CHEST  2 VIEW COMPARISON:  07/19/2014 FINDINGS: Heart size is normal. Minimal subsegmental atelectasis at the right lung base. No focal consolidations pleural effusions. No pulmonary edema. Mid thoracic spondylosis. IMPRESSION: Minimal right lower lobe atelectasis. Electronically Signed   By: Nolon Nations M.D.   On: 10/26/2015 17:09    Assessment & Plan:   There are no diagnoses linked to this encounter. I am having [name] maintain his aspirin EC, nitroGLYCERIN, traMADol, losartan, atorvastatin,  alprazolam, losartan, naproxen, and isosorbide mononitrate. We will continue to administer albuterol.  No orders of the defined types were placed in this encounter.    Follow-up: No Follow-up on file.  Walker Kehr, MD

## 2016-04-24 NOTE — Assessment & Plan Note (Addendum)
The pt had a couple seizure episodes both related to his coughing spell: twitching - "kinda passed out" for a couple seconds. Lat episode last Thursday and one on the Thanksgiving day.   He has a h/o epilepsy as a child and was on Phenobarbital - stopped it at 55 yo.  D/c Tramadol No driving x6 mo Start Gabapentin for pain/seizures Neurol ref - pt declined Labs, CT head

## 2016-04-24 NOTE — Telephone Encounter (Signed)
Renee, phlebotomists in [place] lab called to advise that she was performing phlebotomy on patient and had a problem with the vacuum malfunctioning in the tube. She had to stick pt twice and the same thing happened with the 2nd tube. She states pt told her to take it out and he left cursing and upset. She just wanted PCP aware that No blood or urine specimens were collected.

## 2016-04-24 NOTE — Telephone Encounter (Signed)
Noted. Thx.

## 2016-04-25 NOTE — Assessment & Plan Note (Signed)
Doing better on BiPAP

## 2016-05-03 ENCOUNTER — Other Ambulatory Visit: Payer: Self-pay | Admitting: Internal Medicine

## 2016-05-03 ENCOUNTER — Ambulatory Visit (INDEPENDENT_AMBULATORY_CARE_PROVIDER_SITE_OTHER)
Admission: RE | Admit: 2016-05-03 | Discharge: 2016-05-03 | Disposition: A | Discharge status: Home / Self Care | Payer: BLUE CROSS/BLUE SHIELD | Source: Ambulatory Visit | Attending: Internal Medicine | Admitting: Internal Medicine

## 2016-05-03 DIAGNOSIS — R569 Unspecified convulsions: Secondary | ICD-10-CM

## 2016-05-03 MED ORDER — AMOXICILLIN-POT CLAVULANATE 875-125 MG PO TABS: 1.0000 | ORAL_TABLET | Freq: Two times a day (BID) | ORAL | 0 refills | Status: DC

## 2016-05-15 ENCOUNTER — Ambulatory Visit (INDEPENDENT_AMBULATORY_CARE_PROVIDER_SITE_OTHER): Payer: BLUE CROSS/BLUE SHIELD | Admitting: Internal Medicine

## 2016-05-15 ENCOUNTER — Encounter: Payer: Self-pay | Admitting: Internal Medicine

## 2016-05-15 VITALS — BP 140/78 | HR 104 | Temp 98.4°F | Ht 72.0 in | Wt 326.0 lb

## 2016-05-15 DIAGNOSIS — R569 Unspecified convulsions: Secondary | ICD-10-CM

## 2016-05-15 DIAGNOSIS — M5416 Radiculopathy, lumbar region: Secondary | ICD-10-CM

## 2016-05-15 DIAGNOSIS — J0101 Acute recurrent maxillary sinusitis: Secondary | ICD-10-CM | POA: Diagnosis not present

## 2016-05-15 DIAGNOSIS — Z0000 Encounter for general adult medical examination without abnormal findings: Secondary | ICD-10-CM | POA: Diagnosis not present

## 2016-05-15 DIAGNOSIS — F411 Generalized anxiety disorder: Secondary | ICD-10-CM | POA: Diagnosis not present

## 2016-05-15 DIAGNOSIS — R768 Other specified abnormal immunological findings in serum: Secondary | ICD-10-CM

## 2016-05-15 DIAGNOSIS — G40909 Epilepsy, unspecified, not intractable, without status epilepticus: Secondary | ICD-10-CM | POA: Diagnosis not present

## 2016-05-15 DIAGNOSIS — F17200 Nicotine dependence, unspecified, uncomplicated: Secondary | ICD-10-CM

## 2016-05-15 MED ORDER — HYDROCOD POLST-CPM POLST ER 10-8 MG/5ML PO SUER: 5.0000 mL | Freq: Two times a day (BID) | ORAL | 0 refills | Status: DC | PRN

## 2016-05-15 NOTE — Assessment & Plan Note (Signed)
OK off Tramadol - on Gabapentin

## 2016-05-15 NOTE — Assessment & Plan Note (Signed)
[facility] appt pending next week

## 2016-05-15 NOTE — Assessment & Plan Note (Signed)
On abx - better CT reviewed

## 2016-05-15 NOTE — Assessment & Plan Note (Signed)
Alprazolam prn  Potential benefits of a long term benzodiazepines  use as well as potential risks  and complications were explained to the patient and were aknowledged. 

## 2016-05-15 NOTE — Assessment & Plan Note (Signed)
We discussed age appropriate health related issues, including available/recomended screening tests and vaccinations. We discussed a need for adhering to healthy diet and exercise. Labs/EKG were reviewed/ordered. All questions were answered.   

## 2016-05-15 NOTE — Assessment & Plan Note (Signed)
On Gabapentin 

## 2016-05-15 NOTE — Progress Notes (Signed)
Subjective:  Patient ID: [name], male    DOB: 28-Oct-1960  Age: 56 y.o. MRN: [id number]  CC: Annual Exam   HPI [name] presents for a well exam F/u sinusitis - on abx F/u "twitching"- doing well  Outpatient Medications Prior to Visit  Medication Sig Dispense Refill  . alprazolam (XANAX) 2 MG tablet TAKE 1 TABLET BY MOUTH THREE TIMES DAILY AS NEEDED FOR ANXIETY 90 tablet 1  . amoxicillin-clavulanate (AUGMENTIN) 875-125 MG tablet Take 1 tablet by mouth 2 (two) times daily. 28 tablet 0  . aspirin EC 81 MG tablet Take 1 tablet (81 mg total) by mouth daily.    [name] atorvastatin (LIPITOR) 20 MG tablet Take 1 tablet (20 mg total) by mouth daily. 90 tablet 3  . budesonide-formoterol (SYMBICORT) 160-4.5 MCG/ACT inhaler Inhale 2 puffs into the lungs 2 (two) times daily. 1 Inhaler 6  . chlorpheniramine-HYDROcodone (TUSSIONEX PENNKINETIC ER) 10-8 MG/5ML SUER Take 5 mLs by mouth every 12 (twelve) hours as needed for cough. 115 mL 0  . gabapentin (NEURONTIN) 300 MG capsule Take 1 capsule (300 mg total) by mouth 3 (three) times daily. 60 capsule 5  . isosorbide mononitrate (IMDUR) 30 MG 24 hr tablet Take 1 tablet (30 mg total) by mouth daily. 90 tablet 2  . losartan (COZAAR) 100 MG tablet Take 1 tablet (100 mg total) by mouth daily. 90 tablet 3  . losartan (COZAAR) 100 MG tablet TAKE 1 TABLET BY MOUTH DAILY 90 tablet 0  . naproxen (NAPROSYN) 500 MG tablet TAKE 1 TABLET BY MOUTH DAILY AS NEEDED 90 tablet 0  . nitroGLYCERIN (NITROSTAT) 0.4 MG SL tablet Place 1 tablet (0.4 mg total) under the tongue every 5 (five) minutes as needed. 20 tablet 3   Facility-Administered Medications Prior to Visit  Medication Dose Route Frequency Provider Last Rate Last Dose  . albuterol (PROVENTIL) (2.5 MG/3ML) 0.083% nebulizer solution 2.5 mg  2.5 mg Nebulization Once Cassandria Anger, MD        ROS Review of Systems  Constitutional: Negative for appetite change, fatigue and  unexpected weight change.  HENT: Positive for sinus pain and sinus pressure. Negative for congestion, nosebleeds, sneezing, sore throat and trouble swallowing.   Eyes: Negative for itching and visual disturbance.  Respiratory: Positive for cough.   Cardiovascular: Negative for chest pain, palpitations and leg swelling.  Gastrointestinal: Negative for abdominal distention, blood in stool, diarrhea and nausea.  Genitourinary: Negative for frequency and hematuria.  Musculoskeletal: Negative for back pain, gait problem, joint swelling and neck pain.  Skin: Negative for rash.  Neurological: Negative for dizziness, tremors, speech difficulty and weakness.  Psychiatric/Behavioral: Negative for agitation, dysphoric mood, sleep disturbance and suicidal ideas. The patient is not nervous/anxious.     Objective:  BP 140/78   Pulse (!) 104   Temp 98.4 F (36.9 C) (Oral)   Ht 6' (1.829 m)   Wt (!) 326 lb (147.9 kg)   SpO2 98%   BMI 44.21 kg/m   BP Readings from Last 3 Encounters:  05/15/16 140/78  04/24/16 104/80  02/07/16 110/78    Wt Readings from Last 3 Encounters:  05/15/16 (!) 326 lb (147.9 kg)  04/24/16 (!) 323 lb (146.5 kg)  03/17/16 (!) 320 lb (145.2 kg)    Physical Exam  Constitutional: He is oriented to person, place, and time. He appears well-developed and well-nourished. No distress.  HENT:  Head: Normocephalic and atraumatic.  Right Ear: External ear normal.  Left Ear: External ear  normal.  Nose: Nose normal.  Mouth/Throat: Oropharynx is clear and moist. No oropharyngeal exudate.  Eyes: Conjunctivae and EOM are normal. Pupils are equal, round, and reactive to light. Right eye exhibits no discharge. Left eye exhibits no discharge. No scleral icterus.  Neck: Normal range of motion. Neck supple. No JVD present. No tracheal deviation present. No thyromegaly present.  Cardiovascular: Normal rate, regular rhythm, normal heart sounds and intact distal pulses.  Exam reveals no  gallop and no friction rub.   No murmur heard. Pulmonary/Chest: Effort normal and breath sounds normal. No stridor. No respiratory distress. He has no wheezes. He has no rales. He exhibits no tenderness.  Abdominal: Soft. Bowel sounds are normal. He exhibits no distension and no mass. There is no tenderness. There is no rebound and no guarding.  Genitourinary: Rectum normal, prostate normal and penis normal. Rectal exam shows guaiac negative stool. No penile tenderness.  Musculoskeletal: Normal range of motion. He exhibits tenderness. He exhibits no edema.  Lymphadenopathy:    He has no cervical adenopathy.  Neurological: He is alert and oriented to person, place, and time. He has normal reflexes. No cranial nerve deficit. He exhibits normal muscle tone. Coordination normal.  Skin: Skin is warm and dry. No rash noted. He is not diaphoretic. No erythema. No pallor.  Psychiatric: He has a normal mood and affect. His behavior is normal. Judgment and thought content normal.  Obese Large abd Wax in L ear>>R Rectal - declined  Lab Results  Component Value Date   WBC 6.8 10/26/2015   HGB 15.9 10/26/2015   HCT 45.7 10/26/2015   PLT 131 (L) 10/26/2015   GLUCOSE 163 (H) 10/26/2015   CHOL 126 10/18/2013   TRIG 376.0 (H) 10/18/2013   HDL 36.00 (L) 10/18/2013   LDLDIRECT 48.0 05/28/2012   LDLCALC 15 10/18/2013   ALT 38 11/09/2015   AST 20 11/09/2015   NA 141 10/26/2015   K 3.8 10/26/2015   CL 107 10/26/2015   CREATININE 0.87 10/26/2015   BUN 14 10/26/2015   CO2 26 10/26/2015   TSH 2.08 05/28/2012   PSA 0.33 02/02/2015   INR 0.9 11/16/2013   HGBA1C 5.8 01/18/2015    Ct Head Wo Contrast  Result Date: 05/03/2016 CLINICAL DATA:  Headache and seizures EXAM: CT HEAD WITHOUT CONTRAST TECHNIQUE: Contiguous axial images were obtained from the base of the skull through the vertex without intravenous contrast. COMPARISON:  None. FINDINGS: Brain: The ventricles are normal in size and  configuration. There is borderline cerebellar tonsillar ectopia. There is no intracranial mass, hemorrhage, extra-axial fluid collection, or midline shift. Gray-white compartments are normal. No acute infarct evident. Vascular: There is no hyperdense vessel. There are scattered foci of calcification in the carotid siphon regions. Skull: The bony calvarium appears intact. Sinuses/Orbits: There are air-fluid levels in each maxillary antrum. There is extensive opacification of multiple ethmoid air cells bilaterally. There is mild mucosal thickening in the inferior right frontal sinus. There is mucosal thickening in the sphenoid sinus region, primarily on the right. Orbits appear symmetric bilaterally. Other: Mastoid air cells are clear. IMPRESSION: Multifocal paranasal sinus disease with air-fluid levels in each maxillary antrum. No intracranial mass hemorrhage, or extra-axial fluid collection. Gray-white compartments appear normal. Note that there is borderline cerebellar tonsillar ectopia. Electronically Signed   By: Lowella Grip III M.D.   On: 05/03/2016 08:38    Assessment & Plan:   There are no diagnoses linked to this encounter. I am having Mr. Loeber maintain his  aspirin EC, nitroGLYCERIN, losartan, atorvastatin, alprazolam, losartan, naproxen, isosorbide mononitrate, gabapentin, chlorpheniramine-HYDROcodone, budesonide-formoterol, and amoxicillin-clavulanate. We will continue to administer albuterol.  No orders of the defined types were placed in this encounter.    Follow-up: No Follow-up on file.  Walker Kehr, MD

## 2016-05-15 NOTE — Assessment & Plan Note (Signed)
2 ppd - discussed

## 2016-05-15 NOTE — Progress Notes (Signed)
Pre visit review using our clinic review tool, if applicable. No additional management support is needed unless otherwise documented below in the visit note. 

## 2016-05-15 NOTE — Assessment & Plan Note (Signed)
No relapse 

## 2016-05-21 ENCOUNTER — Ambulatory Visit: Payer: BLUE CROSS/BLUE SHIELD | Admitting: Adult Health

## 2016-05-22 ENCOUNTER — Telehealth: Payer: Self-pay | Admitting: *Deleted

## 2016-05-22 ENCOUNTER — Encounter: Payer: Self-pay | Admitting: Internal Medicine

## 2016-05-22 ENCOUNTER — Ambulatory Visit (INDEPENDENT_AMBULATORY_CARE_PROVIDER_SITE_OTHER): Payer: BLUE CROSS/BLUE SHIELD | Admitting: Internal Medicine

## 2016-05-22 VITALS — BP 132/90 | HR 84 | Temp 97.5°F | Ht 72.0 in | Wt 322.0 lb

## 2016-05-22 DIAGNOSIS — B182 Chronic viral hepatitis C: Secondary | ICD-10-CM | POA: Diagnosis not present

## 2016-05-22 DIAGNOSIS — Z23 Encounter for immunization: Secondary | ICD-10-CM

## 2016-05-22 HISTORY — DX: Chronic viral hepatitis C: B18.2

## 2016-05-22 LAB — CBC WITH DIFFERENTIAL/PLATELET
BASOS PCT: 0 %
Basophils Absolute: 0 cells/uL (ref 0–200)
EOS ABS: 300 {cells}/uL (ref 15–500)
Eosinophils Relative: 4 %
HCT: 47.5 % (ref 38.5–50.0)
Hemoglobin: 16.2 g/dL (ref 13.2–17.1)
LYMPHS PCT: 32 %
Lymphs Abs: 2400 cells/uL (ref 850–3900)
MCH: 31.1 pg (ref 27.0–33.0)
MCHC: 34.1 g/dL (ref 32.0–36.0)
MCV: 91.2 fL (ref 80.0–100.0)
MONOS PCT: 7 %
MPV: 10.6 fL (ref 7.5–12.5)
Monocytes Absolute: 525 cells/uL (ref 200–950)
NEUTROS PCT: 57 %
Neutro Abs: 4275 cells/uL (ref 1500–7800)
PLATELETS: 147 10*3/uL (ref 140–400)
RBC: 5.21 MIL/uL (ref 4.20–5.80)
RDW: 13.4 % (ref 11.0–15.0)
WBC: 7.5 10*3/uL (ref 3.8–10.8)

## 2016-05-22 LAB — HIV ANTIBODY (ROUTINE TESTING W REFLEX): HIV 1&2 Ab, 4th Generation: NONREACTIVE

## 2016-05-22 NOTE — Telephone Encounter (Signed)
Called patient and notified of appt for ultrasound on 06/04/16 at 8:15 AM at [facility]. Nothing to eat or drink after midnight. Myrtis Hopping

## 2016-05-22 NOTE — Progress Notes (Signed)
[facility]   CC: consideration for treatment for chronic hepatitis C  HPI:  +[name] is a 56 y.o. male who presents for initial evaluation and management of chronic hepatitis C.  Patient tested positive about 6 months ago during routine exam. Hepatitis C-associated risk factors present are: IV drug abuse (details: over 30 years ago). Patient denies history of blood transfusion, history of clotting factor transfusion, intranasal drug use, renal dialysis, sexual contact with person with liver disease. Patient has had other studies performed. Results: hepatitis C RNA by PCR, result: positive. Patient has not had prior treatment for Hepatitis C. Patient does not have a past history of liver disease. Patient does not have a family history of liver disease. Patient does not  have associated signs or symptoms related to liver disease.  Labs reviewed and confirm chronic hepatitis C with a positive viral load.   Records reviewed from [facility] and his HCV viral load is just 42 copies which is unusual.  He has not had any recent risk factors of recent drug use.        Patient does not have documented immunity to Hepatitis A. Patient does not have documented immunity to Hepatitis B.    Review of Systems:  Constitutional: negative for fatigue and malaise Gastrointestinal: negative for diarrhea Integument/breast: negative for rash Musculoskeletal: negative for myalgias and arthralgias All other systems reviewed and are negative       Past Medical History:  Diagnosis Date  . Anxiety   . CAD (coronary artery disease)   . Cocaine abuse    none now  . Diabetes mellitus     II, borderline, diet controlled  . GERD (gastroesophageal reflux disease)   . Hyperlipidemia   . Myocardial infarction "distant past"   related to cocaine abuse  . Obesity, unspecified   . OSA (obstructive sleep apnea)    cpap -PSG apr30 2009, [facility]  119  . Pain, low back   . Tobacco  use disorder     Prior to Admission medications   Medication Sig Start Date End Date Taking? Authorizing Provider  aspirin EC 81 MG tablet Take 1 tablet (81 mg total) by mouth daily. 02/05/12  Yes Larey Dresser, MD  atorvastatin (LIPITOR) 20 MG tablet Take 1 tablet (20 mg total) by mouth daily. 02/07/16  Yes Evie Lacks Plotnikov, MD  budesonide-formoterol (SYMBICORT) 160-4.5 MCG/ACT inhaler Inhale 2 puffs into the lungs 2 (two) times daily. 04/24/16  Yes Cassandria Anger, MD  chlorpheniramine-HYDROcodone (TUSSIONEX PENNKINETIC ER) 10-8 MG/5ML SUER Take 5 mLs by mouth every 12 (twelve) hours as needed for cough. 05/15/16  Yes Evie Lacks Plotnikov, MD  gabapentin (NEURONTIN) 300 MG capsule Take 1 capsule (300 mg total) by mouth 3 (three) times daily. 04/24/16  Yes Evie Lacks Plotnikov, MD  isosorbide mononitrate (IMDUR) 30 MG 24 hr tablet Take 1 tablet (30 mg total) by mouth daily. 04/23/16  Yes Evie Lacks Plotnikov, MD  losartan (COZAAR) 100 MG tablet Take 1 tablet (100 mg total) by mouth daily. 02/07/16 02/06/17 Yes Evie Lacks Plotnikov, MD  naproxen (NAPROSYN) 500 MG tablet TAKE 1 TABLET BY MOUTH DAILY AS NEEDED 04/19/16  Yes Evie Lacks Plotnikov, MD  nitroGLYCERIN (NITROSTAT) 0.4 MG SL tablet Place 1 tablet (0.4 mg total) under the tongue every 5 (five) minutes as needed. 11/09/15  Yes Cassandria Anger, MD    Allergies  Allergen Reactions  . Ace Inhibitors   . Lisinopril  cough  . Tramadol     Pt had seizures while on it    Social History  Substance Use Topics  . Smoking status: Current Every Day Smoker    Packs/day: 2.00    Years: 25.00    Types: Cigarettes  . Smokeless tobacco: Never Used  . Alcohol use 3.6 oz/week    6 Standard drinks or equivalent per week     Comment: 6 drinks weekly    Family History  Problem Relation Age of Onset  . Heart attack Father 80    grandfather died MI 55  . Heart disease Father     cad  . Cancer Mother     lung ca  . Heart attack    .  Atrial fibrillation Other   no cirrhosis, no liver cancer   Objective:  Constitutional: in no apparent distress and alert,  Vitals:   05/22/16 1044  BP: 132/90  Pulse: 84  Temp: 97.5 F (36.4 C)   Eyes: anicteric Cardiovascular: Cor RRR Respiratory: CTA B; normal respiratory effort Gastrointestinal: Bowel sounds are normal, obese and unable to palpate for liver, spleen Musculoskeletal: no pedal edema noted Skin: negatives: no rash; no porphyria cutanea tarda Lymphatic: no cervical lymphadenopathy   Laboratory Genotype: No results found for: HCVGENOTYPE HCV viral load:  Lab Results  Component Value Date   HCVQUANT 42 (H) 07/26/2015   Lab Results  Component Value Date   WBC 6.8 10/26/2015   HGB 15.9 10/26/2015   HCT 45.7 10/26/2015   MCV 89.1 10/26/2015   PLT 131 (L) 10/26/2015    Lab Results  Component Value Date   CREATININE 0.87 10/26/2015   BUN 14 10/26/2015   NA 141 10/26/2015   K 3.8 10/26/2015   CL 107 10/26/2015   CO2 26 10/26/2015    Lab Results  Component Value Date   ALT 38 11/09/2015   AST 20 11/09/2015   ALKPHOS 62 11/09/2015     Labs and history reviewed and show CHILD-PUGH unknown  5-6 points: Child class A 7-9 points: Child class B 10-15 points: Child class C  Lab Results  Component Value Date   INR 0.9 11/16/2013   BILITOT 1.1 11/09/2015   ALBUMIN 4.5 11/09/2015     Assessment: New Patient with Chronic Hepatitis C genotype unknown, untreated.  I discussed with the patient the lab findings that confirm chronic hepatitis C as well as the natural history and progression of disease including about 30% of people who develop cirrhosis of the liver if left untreated and once cirrhosis is established there is a 2-7% risk per year of liver cancer and liver failure.  I discussed the importance of treatment and benefits in reducing the risk, even if significant liver fibrosis exists.   Plan: 1) Patient counseled extensively on limiting  acetaminophen to no more than 2 grams daily, avoidance of alcohol. 2) Transmission discussed with patient including sexual transmission, sharing razors and toothbrush.   3) Will need referral to gastroenterology if concern for cirrhosis 4) Will need referral for substance abuse counseling: No.; Further work up to include urine drug screen  No. 5) Will prescribe appropriate medication based on genotype and coverage  6) Hepatitis A and B titers 7) Pneumovax vaccine today 9) Further work up to include liver staging with elastography 10) will follow up after starting medication  Will send prescription once I have confirmation of positive viral load

## 2016-05-22 NOTE — Patient Instructions (Signed)
Date 05/22/16  Dear [name], As discussed in the [facility], your hepatitis C therapy will include highly effective medication(s) for treatment and will vary based on the type of hepatitis C and insurance approval.  Potential medications include:          Harvoni (sofosbuvir 90mg /ledipasvir 400mg ) tablet oral daily          OR     Epclusa (sofosbuvir 400mg /velpatasvir 100mg ) tablet oral daily          OR      Mavyret (glecaprevir 100 mg/pibrentasvir 40 mg): Take 3 tablets oral daily          OR     Zepatier (elbasvir 50 mg/grazoprevir 100 mg) oral daily, +/- ribavirin              Medications are typically for 8 or 12 weeks total ---------------------------------------------------------------- Your HCV Treatment Start Date: You will be notified by our office once the medication is approved and where you can pick it up (or if mailed)   ---------------------------------------------------------------- [place]:   [facility] [place], [place] [address] Phone: [phone] Hours: Monday to Friday 7:30 am to 6:00 pm   Please always contact your pharmacy at least 3-4 business days before you run out of medications to ensure your next month's medication is ready or 1 week prior to running out if you receive it by mail.  Remember, each prescription is for 28 days. ---------------------------------------------------------------- GENERAL NOTES REGARDING YOUR HEPATITIS C MEDICATION:  Some medications have the following interactions:  - Acid reducing agents such as H2 blockers (ie. Pepcid (famotidine), Zantac (ranitidine), Tagamet (cimetidine), Axid (nizatidine) and proton pump inhibitors (ie. Prilosec (omeprazole), Protonix (pantoprazole), Nexium (esomeprazole), or Aciphex (rabeprazole)). Do not take until you have discussed with a health care provider.    -Antacids that contain magnesium and/or aluminum hydroxide (ie. Milk of Magensia,  Rolaids, Gaviscon, Maalox, Mylanta, an dArthritis Pain Formula).  -Calcium carbonate (calcium supplements or antacids such as Tums, Caltrate, Os-Cal).  -St. John's wort or any products that contain St. John's wort like some herbal supplements  Please inform the office prior to starting any of these medications.  - The common side effects associated with Harvoni include:      1. Fatigue      2. Headache      3. Nausea      4. Diarrhea      5. Insomnia  Please note that this only lists the most common side effects and is NOT a comprehensive list of the potential side effects of these medications. For more information, please review the drug information sheets that come with your medication package from the pharmacy.  ---------------------------------------------------------------- GENERAL HELPFUL HINTS ON HCV THERAPY: 1. Stay well-hydrated. 2. Notify the [facility] of any changes in your other over-the-counter/herbal or prescription medications. 3. If you miss a dose of your medication, take the missed dose as soon as you remember. Return to your regular time/dose schedule the next day.  4.  Do not stop taking your medications without first talking with your healthcare provider. 5.  You may take Tylenol (acetaminophen), as long as the dose is less than 2000 mg (OR no more than 4 tablets of the Tylenol Extra Strengths 500mg  tablet) in 24 hours. 6.  You will see our pharmacist-specialist within the first 2 weeks of starting your medication to monitor for any possible side effects. 7.  You will have labs once during treatment, soon  after treatment completion and one final lab 6 months after treatment completion to verify the virus is out of your system.  Scharlene Gloss, [facility] for [facility] [place], [place]  [phone]

## 2016-05-23 LAB — COMPLETE METABOLIC PANEL WITH GFR
ALT: 35 U/L (ref 9–46)
AST: 21 U/L (ref 10–35)
Albumin: 4.6 g/dL (ref 3.6–5.1)
Alkaline Phosphatase: 70 U/L (ref 40–115)
BILIRUBIN TOTAL: 1 mg/dL (ref 0.2–1.2)
BUN: 15 mg/dL (ref 7–25)
CALCIUM: 9.6 mg/dL (ref 8.6–10.3)
CO2: 25 mmol/L (ref 20–31)
CREATININE: 0.96 mg/dL (ref 0.70–1.33)
Chloride: 103 mmol/L (ref 98–110)
GFR, Est African American: >89 mL/min (ref >=60)
GFR, Est Non African American: 89 mL/min (ref >=60)
Glucose, Bld: 121 mg/dL — ABNORMAL HIGH (ref 65–99)
Potassium: 4.2 mmol/L (ref 3.5–5.3)
Sodium: 141 mmol/L (ref 135–146)
TOTAL PROTEIN: 6.9 g/dL (ref 6.1–8.1)

## 2016-05-23 LAB — PROTIME-INR
INR: 0.9
Prothrombin Time: 10.1 s (ref 9.0–11.5)

## 2016-05-23 LAB — HEPATITIS B SURFACE ANTIBODY,QUALITATIVE: Hep B S Ab: NEGATIVE

## 2016-05-23 LAB — HEPATITIS B SURFACE ANTIGEN: Hepatitis B Surface Ag: NEGATIVE

## 2016-05-23 LAB — HEPATITIS A ANTIBODY, TOTAL: HEP A TOTAL AB: NONREACTIVE

## 2016-05-23 LAB — HEPATITIS B CORE ANTIBODY, TOTAL: Hep B Core Total Ab: REACTIVE — AB

## 2016-05-25 LAB — HCV RNA, QN PCR RFLX GENO, LIPA: HCV RNA, PCR, QN: <1.18 log IU/mL (ref <1.18)

## 2016-05-25 LAB — LIVER FIBROSIS, FIBROTEST-ACTITEST
ALPHA-2-MACROGLOBULIN: 231 mg/dL (ref 106–279)
ALT: 35 U/L (ref 9–46)
Apolipoprotein A1: 127 mg/dL (ref 94–176)
Bilirubin: 0.9 mg/dL (ref 0.2–1.2)
Fibrosis Score: 0.53
GGT: 47 U/L (ref 3–95)
Haptoglobin: 106 mg/dL (ref 43–212)
Necroinflammat ACT Score: 0.24
Reference ID: 1771828

## 2016-05-27 ENCOUNTER — Telehealth: Payer: Self-pay | Admitting: *Deleted

## 2016-05-27 NOTE — Telephone Encounter (Signed)
Relayed to patient. Cancelled ultrasound (Patient did not want to come to [facility] during flu outbreak). Landis Gandy, RN

## 2016-05-27 NOTE — Telephone Encounter (Signed)
-----   Message from Thayer Headings, MD sent at 05/27/2016  1:24 PM EST ----- Please let him know his HCV virus has resolved on its own and is negative so he does not need treatment.  This possibility was discussed with him at the appt.  Can cancel his follow up with me.  I also copied his PCP. Thanks Rob

## 2016-06-04 ENCOUNTER — Ambulatory Visit (HOSPITAL_COMMUNITY): Payer: BLUE CROSS/BLUE SHIELD

## 2016-06-17 ENCOUNTER — Encounter: Payer: Self-pay | Admitting: Internal Medicine

## 2016-06-17 ENCOUNTER — Ambulatory Visit (INDEPENDENT_AMBULATORY_CARE_PROVIDER_SITE_OTHER): Payer: BLUE CROSS/BLUE SHIELD | Admitting: Internal Medicine

## 2016-06-17 VITALS — BP 138/88 | HR 129 | Temp 99.0°F | Ht 72.0 in | Wt 320.0 lb

## 2016-06-17 DIAGNOSIS — J441 Chronic obstructive pulmonary disease with (acute) exacerbation: Secondary | ICD-10-CM | POA: Diagnosis not present

## 2016-06-17 MED ORDER — ALBUTEROL SULFATE HFA 108 (90 BASE) MCG/ACT IN AERS: 2.0000 | INHALATION_SPRAY | Freq: Four times a day (QID) | RESPIRATORY_TRACT | 2 refills | Status: DC | PRN

## 2016-06-17 MED ORDER — IPRATROPIUM-ALBUTEROL 0.5-2.5 (3) MG/3ML IN SOLN
3.0000 mL | Freq: Once | RESPIRATORY_TRACT | Status: AC
  Administered 2016-06-17: 3 mL via RESPIRATORY_TRACT

## 2016-06-17 MED ORDER — PREDNISONE 20 MG PO TABS
40.0000 mg | ORAL_TABLET | Freq: Every day | ORAL | 0 refills | Status: DC
Start: 2016-06-17 — End: 2016-06-20

## 2016-06-17 NOTE — Patient Instructions (Signed)
We have given you the nebulizer treatment today.   We have sent in the rescue inhaler called albuterol which you can use for shortness of breath or not being able to breathe.   We have also sent in prednisone to start taking today. Take 2 pills a day for 5 days.   We want you to be seen back in 3-4 days to have someone recheck your lungs. If you feel things are worsening or you cannot breathe come back sooner. If you cannot breathe call 911 or go to the emergency room.

## 2016-06-17 NOTE — Assessment & Plan Note (Signed)
Likely set off by viral cold. Rx for prednisone and albuterol inhaler. He will keep using symbicort and is advised to stop smoking as this worsens his breathing. He will need close follow up with his hx in 3-4 days for re-evaluation given the seriousness of this flare with relative hypoxia, tachycardia.

## 2016-06-17 NOTE — Progress Notes (Signed)
Pre visit review using our clinic review tool, if applicable. No additional management support is needed unless otherwise documented below in the visit note. 

## 2016-06-17 NOTE — Progress Notes (Signed)
   Subjective:    Patient ID: [name], male    DOB: 1961/01/17, 56 y.o.   MRN: [id number]  HPI The patient is a 56 YO man coming in for SOB and cough for about 1-2 days. He was not able to get around well yesterday due to SOB. Cough is mostly dry but some production. He is a current smoker and is still smoking although not as much with the cough. He has been hospitalized in the past for his breathing in the ICU with severe problems. He is very concerned about that. He has symbicort but not an albuterol inhaler at home. He has nothing else to take. He has been around sick contacts at his job. He has taken mucinex over the counter. Fevers at home yesterday. Denies chest pains. Some nasal congestion but not much drainage. Uses BiPAP at night time and this felt tight last night like the air was not moving well.   Review of Systems  Constitutional: Positive for activity change, chills, fatigue and fever. Negative for appetite change and unexpected weight change.  HENT: Positive for congestion and rhinorrhea. Negative for ear discharge, ear pain, postnasal drip, sinus pain, sinus pressure, sore throat and trouble swallowing.   Eyes: Negative.   Respiratory: Positive for cough, chest tightness, shortness of breath and wheezing. Negative for apnea.   Cardiovascular: Negative.   Gastrointestinal: Negative.   Musculoskeletal: Negative.   Skin: Negative.       Objective:   Physical Exam  Constitutional: He is oriented to person, place, and time. He appears well-developed and well-nourished.  Overweight in mild distress  HENT:  Head: Normocephalic and atraumatic.  Right Ear: External ear normal.  Left Ear: External ear normal.  Oropharynx with redness and clear drainage, no sinus pressure.   Eyes: EOM are normal.  Neck: Normal range of motion.  Cardiovascular: Regular rhythm.   Fast rate  Pulmonary/Chest: Effort normal.  On initial exam poor airflow, after nebulizer treatment  more air flow and some rhonchi and wheezing.   Abdominal: Soft. He exhibits no distension. There is no tenderness.  Neurological: He is alert and oriented to person, place, and time.  Skin: Skin is warm and dry.   Vitals:   06/17/16 0933  BP: 138/88  Pulse: (!) 129  Temp: 99 F (37.2 C)  TempSrc: Oral  SpO2: 90%  Weight: (!) 320 lb (145.2 kg)  Height: 6' (1.829 m)      Assessment & Plan:  Visit time 25 minutes: greater than 50% of that time was spent in counseling and coordination of care given the severity of his illness: needed re-evaluation several times with nebulizer treatment. Duoneb given at visit.

## 2016-06-20 ENCOUNTER — Encounter: Payer: Self-pay | Admitting: Internal Medicine

## 2016-06-20 ENCOUNTER — Ambulatory Visit (INDEPENDENT_AMBULATORY_CARE_PROVIDER_SITE_OTHER): Payer: BLUE CROSS/BLUE SHIELD | Admitting: Internal Medicine

## 2016-06-20 DIAGNOSIS — J441 Chronic obstructive pulmonary disease with (acute) exacerbation: Secondary | ICD-10-CM | POA: Diagnosis not present

## 2016-06-20 MED ORDER — GABAPENTIN 300 MG PO CAPS: 300.0000 mg | ORAL_CAPSULE | Freq: Three times a day (TID) | ORAL | 1 refills | Status: DC

## 2016-06-20 MED ORDER — HYDROCOD POLST-CPM POLST ER 10-8 MG/5ML PO SUER: 5.0000 mL | Freq: Two times a day (BID) | ORAL | 0 refills | Status: DC | PRN

## 2016-06-20 MED ORDER — DOXYCYCLINE HYCLATE 100 MG PO TABS: 100.0000 mg | ORAL_TABLET | Freq: Two times a day (BID) | ORAL | 0 refills | Status: DC

## 2016-06-20 MED ORDER — PREDNISONE 20 MG PO TABS: 40.0000 mg | ORAL_TABLET | Freq: Every day | ORAL | 0 refills | Status: DC

## 2016-06-20 NOTE — Progress Notes (Signed)
   Subjective:    Patient ID: [name], male    DOB: [birth year], 56 y.o.   MRN: [id number]  HPI The patient is a 56 YO man coming in for breathing check. He was diagnosed with COPD exacerbation about 3 days ago and given prednisone and cough syrup. It was likely due to viral illness. Since the last visit he has been taking the prednisone and he is feeling about the same. Still having a lot of SOB and difficulty getting around. He tried to go to work yesterday and was not able to make it through the day. He denies fevers or chills. He denies significant congestion in his head. Coughing up yellow to green sputum.   Review of Systems  Constitutional: Positive for activity change, appetite change and fatigue. Negative for chills, fever and unexpected weight change.  HENT: Negative.   Eyes: Negative.   Respiratory: Positive for cough, chest tightness and shortness of breath. Negative for wheezing.   Cardiovascular: Negative.   Gastrointestinal: Negative.   Musculoskeletal: Negative.       Objective:   Physical Exam  Constitutional: He is oriented to person, place, and time. He appears well-developed and well-nourished.  HENT:  Head: Normocephalic and atraumatic.  Right Ear: External ear normal.  Left Ear: External ear normal.  Mouth/Throat: Oropharynx is clear and moist.  Eyes: EOM are normal.  Neck: Normal range of motion.  Cardiovascular: Regular rhythm.   Slightly fast  Pulmonary/Chest: Effort normal. No respiratory distress. He has wheezes. He has no rales. He exhibits no tenderness.  Poor air flow and some rhonchi and wheezing which does not clear with coughing. Able to speak in full sentences  Abdominal: Soft.  Neurological: He is alert and oriented to person, place, and time.  Skin: Skin is warm and dry.   Vitals:   06/20/16 0816  BP: 140/70  Pulse: (!) 102  Temp: 98 F (36.7 C)  TempSrc: Oral  SpO2: 92%  Weight: (!) 321 lb (145.6 kg)  Height: 6' (1.829  m)      Assessment & Plan:

## 2016-06-20 NOTE — Patient Instructions (Signed)
We have sent in more prednisone to continue taking for another 5 days.   We have sent in the doxycycline which is the antibiotic. Take 1 pill twice a day for 1 week.   We have refilled the cough syrup.  If you are not feeling better call us back on Monday.

## 2016-06-20 NOTE — Assessment & Plan Note (Addendum)
No improvement so will extend steroid course and add doxycycline 1 week for continued COPD exacerbation. Refill tussionex and call back in 3-4 days if no improvement. Same precautions about going to ER if worsening or severe SOB. Using inhalers and albuterol prn.

## 2016-06-20 NOTE — Progress Notes (Signed)
Pre visit review using our clinic review tool, if applicable. No additional management support is needed unless otherwise documented below in the visit note. 

## 2016-07-09 ENCOUNTER — Telehealth: Payer: Self-pay

## 2016-07-09 NOTE — Telephone Encounter (Signed)
Rx request fo rNaproxen 500 mg tablet LOV: 06/17/2013 Last refilled 04/19/2016 Next OV 08/14/2016 Please advise

## 2016-07-10 NOTE — Telephone Encounter (Signed)
OK to fill this/these prescription(s) with additional refills x3  Thank you!  

## 2016-07-11 ENCOUNTER — Other Ambulatory Visit: Payer: Self-pay | Admitting: General Practice

## 2016-07-11 MED ORDER — NAPROXEN 500 MG PO TABS: 500.0000 mg | ORAL_TABLET | Freq: Every day | ORAL | 0 refills | Status: DC | PRN

## 2016-08-14 ENCOUNTER — Encounter: Payer: Self-pay | Admitting: Internal Medicine

## 2016-08-14 ENCOUNTER — Ambulatory Visit (INDEPENDENT_AMBULATORY_CARE_PROVIDER_SITE_OTHER): Payer: BLUE CROSS/BLUE SHIELD | Admitting: Internal Medicine

## 2016-08-14 DIAGNOSIS — J301 Allergic rhinitis due to pollen: Secondary | ICD-10-CM

## 2016-08-14 DIAGNOSIS — F411 Generalized anxiety disorder: Secondary | ICD-10-CM

## 2016-08-14 DIAGNOSIS — I10 Essential (primary) hypertension: Secondary | ICD-10-CM | POA: Diagnosis not present

## 2016-08-14 DIAGNOSIS — R569 Unspecified convulsions: Secondary | ICD-10-CM | POA: Diagnosis not present

## 2016-08-14 DIAGNOSIS — J441 Chronic obstructive pulmonary disease with (acute) exacerbation: Secondary | ICD-10-CM

## 2016-08-14 DIAGNOSIS — J309 Allergic rhinitis, unspecified: Secondary | ICD-10-CM | POA: Insufficient documentation

## 2016-08-14 MED ORDER — LORATADINE 10 MG PO TABS: 10.0000 mg | ORAL_TABLET | Freq: Every day | ORAL | 3 refills | Status: DC

## 2016-08-14 MED ORDER — VITAMIN D3 50 MCG (2000 UT) PO CAPS: 2000.0000 [IU] | ORAL_CAPSULE | Freq: Every day | ORAL | 3 refills | Status: DC

## 2016-08-14 MED ORDER — ALBUTEROL SULFATE HFA 108 (90 BASE) MCG/ACT IN AERS: 2.0000 | INHALATION_SPRAY | Freq: Four times a day (QID) | RESPIRATORY_TRACT | 2 refills | Status: DC | PRN

## 2016-08-14 MED ORDER — NITROGLYCERIN 0.4 MG SL SUBL: 0.4000 mg | SUBLINGUAL_TABLET | SUBLINGUAL | 3 refills | Status: DC | PRN

## 2016-08-14 MED ORDER — ALPRAZOLAM 1 MG PO TABS: 0.5000 mg | ORAL_TABLET | Freq: Two times a day (BID) | ORAL | 2 refills | Status: DC | PRN

## 2016-08-14 MED ORDER — LOSARTAN POTASSIUM 100 MG PO TABS: 100.0000 mg | ORAL_TABLET | Freq: Every day | ORAL | 3 refills | Status: DC

## 2016-08-14 MED ORDER — BUDESONIDE-FORMOTEROL FUMARATE 160-4.5 MCG/ACT IN AERO: 2.0000 | INHALATION_SPRAY | Freq: Two times a day (BID) | RESPIRATORY_TRACT | 6 refills | Status: DC

## 2016-08-14 MED ORDER — NAPROXEN 500 MG PO TABS: 500.0000 mg | ORAL_TABLET | Freq: Every day | ORAL | 1 refills | Status: DC | PRN

## 2016-08-14 MED ORDER — ATORVASTATIN CALCIUM 20 MG PO TABS: 20.0000 mg | ORAL_TABLET | Freq: Every day | ORAL | 3 refills | Status: DC

## 2016-08-14 NOTE — Assessment & Plan Note (Signed)
Claritin

## 2016-08-14 NOTE — Assessment & Plan Note (Signed)
Wt Readings from Last 3 Encounters:  08/14/16 (!) 323 lb (146.5 kg)  06/20/16 (!) 321 lb (145.6 kg)  06/17/16 (!) 320 lb (145.2 kg)

## 2016-08-14 NOTE — Assessment & Plan Note (Signed)
Lipitor Isosorbide

## 2016-08-14 NOTE — Progress Notes (Signed)
Subjective:  Patient ID: [name], male    DOB: 1960/10/01  Age: 56 y.o. MRN: [id number]  CC: Follow-up   HPI Shaheer Bonfield presents for COPD, "twitching" - no relapse... C/o allergies C/o anxiety  Outpatient Medications Prior to Visit  Medication Sig Dispense Refill  . albuterol (PROVENTIL HFA;VENTOLIN HFA) 108 (90 Base) MCG/ACT inhaler Inhale 2 puffs into the lungs every 6 (six) hours as needed for wheezing or shortness of breath. 1 Inhaler 2  . aspirin EC 81 MG tablet Take 1 tablet (81 mg total) by mouth daily.    [name] atorvastatin (LIPITOR) 20 MG tablet Take 1 tablet (20 mg total) by mouth daily. 90 tablet 3  . budesonide-formoterol (SYMBICORT) 160-4.5 MCG/ACT inhaler Inhale 2 puffs into the lungs 2 (two) times daily. 1 Inhaler 6  . chlorpheniramine-HYDROcodone (TUSSIONEX PENNKINETIC ER) 10-8 MG/5ML SUER Take 5 mLs by mouth every 12 (twelve) hours as needed for cough. 115 mL 0  . doxycycline (VIBRA-TABS) 100 MG tablet Take 1 tablet (100 mg total) by mouth 2 (two) times daily. 14 tablet 0  . gabapentin (NEURONTIN) 300 MG capsule Take 1 capsule (300 mg total) by mouth 3 (three) times daily. 90 capsule 1  . isosorbide mononitrate (IMDUR) 30 MG 24 hr tablet Take 1 tablet (30 mg total) by mouth daily. 90 tablet 2  . losartan (COZAAR) 100 MG tablet Take 1 tablet (100 mg total) by mouth daily. 90 tablet 3  . naproxen (NAPROSYN) 500 MG tablet Take 1 tablet (500 mg total) by mouth daily as needed. 90 tablet 0  . nitroGLYCERIN (NITROSTAT) 0.4 MG SL tablet Place 1 tablet (0.4 mg total) under the tongue every 5 (five) minutes as needed. 20 tablet 3  . predniSONE (DELTASONE) 20 MG tablet Take 2 tablets (40 mg total) by mouth daily with breakfast. 10 tablet 0   No facility-administered medications prior to visit.     ROS Review of Systems  Constitutional: Negative for appetite change, fatigue and unexpected weight change.  HENT: Negative for congestion, nosebleeds,  sneezing, sore throat and trouble swallowing.   Eyes: Negative for itching and visual disturbance.  Respiratory: Negative for cough.   Cardiovascular: Negative for chest pain, palpitations and leg swelling.  Gastrointestinal: Negative for abdominal distention, blood in stool, diarrhea and nausea.  Genitourinary: Negative for frequency and hematuria.  Musculoskeletal: Positive for back pain. Negative for gait problem, joint swelling and neck pain.  Skin: Negative for rash.  Neurological: Negative for dizziness, tremors, speech difficulty and weakness.  Psychiatric/Behavioral: Negative for agitation, dysphoric mood, sleep disturbance and suicidal ideas. The patient is not nervous/anxious.   Obesy  Objective:  BP 136/88   Pulse 96   Temp 98.2 F (36.8 C)   Ht 6' (1.829 m)   Wt (!) 323 lb (146.5 kg)   SpO2 98%   BMI 43.81 kg/m   BP Readings from Last 3 Encounters:  08/14/16 136/88  06/20/16 140/70  06/17/16 138/88    Wt Readings from Last 3 Encounters:  08/14/16 (!) 323 lb (146.5 kg)  06/20/16 (!) 321 lb (145.6 kg)  06/17/16 (!) 320 lb (145.2 kg)    Physical Exam  Constitutional: He is oriented to person, place, and time. He appears well-developed. No distress.  NAD  HENT:  Mouth/Throat: Oropharynx is clear and moist.  Eyes: Conjunctivae are normal. Pupils are equal, round, and reactive to light.  Neck: Normal range of motion. No JVD present. No thyromegaly present.  Cardiovascular: Normal rate, regular rhythm,  normal heart sounds and intact distal pulses.  Exam reveals no gallop and no friction rub.   No murmur heard. Pulmonary/Chest: Effort normal and breath sounds normal. No respiratory distress. He has no wheezes. He has no rales. He exhibits no tenderness.  Abdominal: Soft. Bowel sounds are normal. He exhibits no distension and no mass. There is no tenderness. There is no rebound and no guarding.  Musculoskeletal: Normal range of motion. He exhibits tenderness. He  exhibits no edema.  Lymphadenopathy:    He has no cervical adenopathy.  Neurological: He is alert and oriented to person, place, and time. He has normal reflexes. No cranial nerve deficit. He exhibits normal muscle tone. He displays a negative Romberg sign. Coordination and gait normal.  Skin: Skin is warm and dry. No rash noted.  Psychiatric: He has a normal mood and affect. His behavior is normal. Judgment and thought content normal.  Obese  Lab Results  Component Value Date   WBC 7.5 05/22/2016   HGB 16.2 05/22/2016   HCT 47.5 05/22/2016   PLT 147 05/22/2016   GLUCOSE 121 (H) 05/22/2016   CHOL 126 10/18/2013   TRIG 376.0 (H) 10/18/2013   HDL 36.00 (L) 10/18/2013   LDLDIRECT 48.0 05/28/2012   LDLCALC 15 10/18/2013   ALT 35 05/22/2016   ALT 35 05/22/2016   AST 21 05/22/2016   NA 141 05/22/2016   K 4.2 05/22/2016   CL 103 05/22/2016   CREATININE 0.96 05/22/2016   BUN 15 05/22/2016   CO2 25 05/22/2016   TSH 2.08 05/28/2012   PSA 0.33 02/02/2015   INR 0.9 05/22/2016   HGBA1C 5.8 01/18/2015    Ct Head Wo Contrast  Result Date: 05/03/2016 CLINICAL DATA:  Headache and seizures EXAM: CT HEAD WITHOUT CONTRAST TECHNIQUE: Contiguous axial images were obtained from the base of the skull through the vertex without intravenous contrast. COMPARISON:  None. FINDINGS: Brain: The ventricles are normal in size and configuration. There is borderline cerebellar tonsillar ectopia. There is no intracranial mass, hemorrhage, extra-axial fluid collection, or midline shift. Gray-white compartments are normal. No acute infarct evident. Vascular: There is no hyperdense vessel. There are scattered foci of calcification in the carotid siphon regions. Skull: The bony calvarium appears intact. Sinuses/Orbits: There are air-fluid levels in each maxillary antrum. There is extensive opacification of multiple ethmoid air cells bilaterally. There is mild mucosal thickening in the inferior right frontal sinus.  There is mucosal thickening in the sphenoid sinus region, primarily on the right. Orbits appear symmetric bilaterally. Other: Mastoid air cells are clear. IMPRESSION: Multifocal paranasal sinus disease with air-fluid levels in each maxillary antrum. No intracranial mass hemorrhage, or extra-axial fluid collection. Gray-white compartments appear normal. Note that there is borderline cerebellar tonsillar ectopia. Electronically Signed   By: Lowella Grip III M.D.   On: 05/03/2016 08:38    Assessment & Plan:   There are no diagnoses linked to this encounter. I am having Mr. Knipple maintain his aspirin EC, nitroGLYCERIN, losartan, atorvastatin, isosorbide mononitrate, budesonide-formoterol, albuterol, predniSONE, doxycycline, chlorpheniramine-HYDROcodone, gabapentin, naproxen, and alprazolam.  Meds ordered this encounter  Medications  . alprazolam (XANAX) 2 MG tablet    Sig: TK 1 T PO TID PRA    Refill:  1     Follow-up: No Follow-up on file.  Walker Kehr, MD

## 2016-08-14 NOTE — Assessment & Plan Note (Signed)
Losartan 

## 2016-08-14 NOTE — Assessment & Plan Note (Signed)
We reduced Xanax prn

## 2016-08-14 NOTE — Assessment & Plan Note (Signed)
No relapse 

## 2016-08-14 NOTE — Assessment & Plan Note (Signed)
Resolved

## 2016-10-29 ENCOUNTER — Ambulatory Visit (INDEPENDENT_AMBULATORY_CARE_PROVIDER_SITE_OTHER): Payer: BLUE CROSS/BLUE SHIELD | Admitting: Internal Medicine

## 2016-10-29 ENCOUNTER — Encounter: Payer: Self-pay | Admitting: Internal Medicine

## 2016-10-29 VITALS — BP 142/88 | HR 90 | Ht 72.0 in | Wt 322.0 lb

## 2016-10-29 DIAGNOSIS — T24202S Burn of second degree of unspecified site of left lower limb, except ankle and foot, sequela: Secondary | ICD-10-CM | POA: Diagnosis not present

## 2016-10-29 DIAGNOSIS — L03115 Cellulitis of right lower limb: Secondary | ICD-10-CM

## 2016-10-29 DIAGNOSIS — J988 Other specified respiratory disorders: Secondary | ICD-10-CM | POA: Diagnosis not present

## 2016-10-29 HISTORY — DX: Burn of second degree of unspecified site of left lower limb, except ankle and foot, sequela: T24.202S

## 2016-10-29 HISTORY — DX: Cellulitis of right lower limb: L03.115

## 2016-10-29 MED ORDER — METHYLPREDNISOLONE ACETATE 80 MG/ML IJ SUSP
80.0000 mg | Freq: Once | INTRAMUSCULAR | Status: AC
  Administered 2016-10-29: 80 mg via INTRAMUSCULAR

## 2016-10-29 MED ORDER — HYDROCODONE-HOMATROPINE 5-1.5 MG/5ML PO SYRP: 5.0000 mL | ORAL_SOLUTION | Freq: Four times a day (QID) | ORAL | 0 refills | Status: AC | PRN

## 2016-10-29 MED ORDER — SILVER SULFADIAZINE 1 % EX CREA: 1.0000 "application " | TOPICAL_CREAM | Freq: Every day | CUTANEOUS | 0 refills | Status: DC

## 2016-10-29 NOTE — Patient Instructions (Signed)
You had the steroid shot today  Please take all new medication as prescribed - the silvadene cream (ok to hold off on the mupirocin cream); and the cough medicine if needed  Please continue all other medications as before, including the prednisone, levaquin antibiotic, and inhaler as you have  Please have the pharmacy call with any other refills you may need.  Please keep your appointments with your specialists as you may have planned

## 2016-10-29 NOTE — Assessment & Plan Note (Signed)
Ok for silvadene cream asd,  to f/u any worsening symptoms or concerns

## 2016-10-29 NOTE — Assessment & Plan Note (Signed)
Mild to mod, for depomedrol IM 80, cont levaquin and prendisone course, inhaler use, add cough med prn,  to f/u any worsening symptoms or concerns

## 2016-10-29 NOTE — Assessment & Plan Note (Signed)
Improved, to continue levaquin as above
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
## 2016-11-07 ENCOUNTER — Ambulatory Visit (INDEPENDENT_AMBULATORY_CARE_PROVIDER_SITE_OTHER): Payer: BLUE CROSS/BLUE SHIELD | Admitting: Internal Medicine

## 2016-11-07 ENCOUNTER — Encounter: Payer: Self-pay | Admitting: Internal Medicine

## 2016-11-07 VITALS — BP 134/86 | HR 96 | Ht 72.0 in | Wt 322.0 lb

## 2016-11-07 DIAGNOSIS — I10 Essential (primary) hypertension: Secondary | ICD-10-CM | POA: Diagnosis not present

## 2016-11-07 DIAGNOSIS — M5416 Radiculopathy, lumbar region: Secondary | ICD-10-CM

## 2016-11-07 DIAGNOSIS — R7309 Other abnormal glucose: Secondary | ICD-10-CM | POA: Diagnosis not present

## 2016-11-07 MED ORDER — OXYCODONE-ACETAMINOPHEN 10-325 MG PO TABS: 1.0000 | ORAL_TABLET | Freq: Four times a day (QID) | ORAL | 0 refills | Status: DC | PRN

## 2016-11-07 MED ORDER — GABAPENTIN 300 MG PO CAPS: 300.0000 mg | ORAL_CAPSULE | Freq: Three times a day (TID) | ORAL | 1 refills | Status: DC

## 2016-11-07 MED ORDER — CYCLOBENZAPRINE HCL 5 MG PO TABS: 5.0000 mg | ORAL_TABLET | Freq: Three times a day (TID) | ORAL | 1 refills | Status: DC | PRN

## 2016-11-07 NOTE — Patient Instructions (Addendum)
Please take all new medication as prescribed - the percocet as needed, as well as the flexeril as needed  Please take the Gabapentin at 300 mg three times per day (a new rx was sent to the pharmacy)  Please continue all other medications as before, and refills have been done if requested.  Please have the pharmacy call with any other refills you may need.  Please keep your appointments with your specialists as you may have planned  You will be contacted regarding the referral for: MRI, and Neurosurgury - asap
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
## 2016-11-08 NOTE — Assessment & Plan Note (Signed)
Sudden worsening Left lumbar radiculopathy with pain and weakness c/w neuro defecit new worsening, for pain control, restart gabapentin at 300 tid, and flexeril for a component of left lumbar msk spasm as well, but I suspect primary issue is possible disc herniation in the setting of known spinal stenosis and foraminal stenosis bilat at multiple levels; also for MRi LS Spine and NS referral asap

## 2016-11-08 NOTE — Assessment & Plan Note (Signed)
Lab Results  Component Value Date   HGBA1C 5.8 01/18/2015  stable, consider prednisone trial if not improved with above

## 2016-11-08 NOTE — Assessment & Plan Note (Signed)
stable overall by history and exam, recent data reviewed with pt, and pt to continue medical treatment as before,  to f/u any worsening symptoms or concerns BP Readings from Last 3 Encounters:  11/07/16 134/86  10/29/16 (!) 142/88  08/14/16 136/88

## 2016-11-11 ENCOUNTER — Telehealth: Payer: Self-pay | Admitting: Internal Medicine

## 2016-11-11 NOTE — Telephone Encounter (Signed)
Very sorry but I would not feel comfortable with change at this time

## 2016-11-11 NOTE — Telephone Encounter (Signed)
Pt states oxyCODONE-acetaminophen (PERCOCET) 10-325 MG tablet Is not working and he wants something stronger   Pt states he needs to get rid of the pain because his MRI isn't until the 16th.

## 2016-11-24 ENCOUNTER — Other Ambulatory Visit: Payer: BLUE CROSS/BLUE SHIELD

## 2016-11-26 ENCOUNTER — Ambulatory Visit (INDEPENDENT_AMBULATORY_CARE_PROVIDER_SITE_OTHER): Payer: BLUE CROSS/BLUE SHIELD | Admitting: Internal Medicine

## 2016-11-26 ENCOUNTER — Encounter: Payer: Self-pay | Admitting: Internal Medicine

## 2016-11-26 DIAGNOSIS — M545 Low back pain: Secondary | ICD-10-CM | POA: Diagnosis not present

## 2016-11-26 DIAGNOSIS — M79605 Pain in left leg: Secondary | ICD-10-CM

## 2016-11-26 DIAGNOSIS — T24202S Burn of second degree of unspecified site of left lower limb, except ankle and foot, sequela: Secondary | ICD-10-CM | POA: Diagnosis not present

## 2016-11-26 MED ORDER — ALPRAZOLAM 1 MG PO TABS: 0.5000 mg | ORAL_TABLET | Freq: Two times a day (BID) | ORAL | 2 refills | Status: DC | PRN

## 2016-11-26 MED ORDER — GABAPENTIN 300 MG PO CAPS: 300.0000 mg | ORAL_CAPSULE | Freq: Three times a day (TID) | ORAL | 1 refills | Status: DC

## 2016-11-26 NOTE — Assessment & Plan Note (Signed)
Healing

## 2016-11-26 NOTE — Assessment & Plan Note (Addendum)
Worse. MRI due to LLE pain Gabapentin dose increased
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
## 2016-11-26 NOTE — Assessment & Plan Note (Signed)
Wt Readings from Last 3 Encounters:  11/26/16 (!) 322 lb (146.1 kg)  11/07/16 (!) 322 lb (146.1 kg)  10/29/16 (!) 322 lb (146.1 kg)

## 2016-12-19 ENCOUNTER — Encounter: Payer: Self-pay | Admitting: Internal Medicine

## 2016-12-19 ENCOUNTER — Ambulatory Visit (INDEPENDENT_AMBULATORY_CARE_PROVIDER_SITE_OTHER): Payer: BLUE CROSS/BLUE SHIELD | Admitting: Internal Medicine

## 2016-12-19 DIAGNOSIS — M545 Low back pain, unspecified: Secondary | ICD-10-CM | POA: Insufficient documentation

## 2016-12-19 DIAGNOSIS — R310 Gross hematuria: Secondary | ICD-10-CM

## 2016-12-19 DIAGNOSIS — M79604 Pain in right leg: Secondary | ICD-10-CM | POA: Insufficient documentation

## 2016-12-19 DIAGNOSIS — M5416 Radiculopathy, lumbar region: Secondary | ICD-10-CM | POA: Diagnosis not present

## 2016-12-19 HISTORY — DX: Gross hematuria: R31.0

## 2016-12-19 MED ORDER — METHYLPREDNISOLONE 4 MG PO TBPK: ORAL_TABLET | ORAL | 0 refills | Status: DC

## 2016-12-19 MED ORDER — OXYCODONE-ACETAMINOPHEN 10-325 MG PO TABS: 1.0000 | ORAL_TABLET | Freq: Four times a day (QID) | ORAL | 0 refills | Status: DC | PRN

## 2016-12-19 NOTE — Progress Notes (Signed)
Subjective:  Patient ID: [name], male    DOB: Feb 17, 1961  Age: 56 y.o. MRN: [id number]  CC: No chief complaint on file.   HPI Galvin Aversa presents for LBP and RLE pain after he mounted a [employer] He passed blood w/urine 2 weeks ago with some discomfort  Outpatient Medications Prior to Visit  Medication Sig Dispense Refill  . albuterol (PROVENTIL HFA;VENTOLIN HFA) 108 (90 Base) MCG/ACT inhaler Inhale 2 puffs into the lungs every 6 (six) hours as needed for wheezing or shortness of breath. 1 Inhaler 2  . ALPRAZolam (XANAX) 1 MG tablet Take 0.5-1 tablets (0.5-1 mg total) by mouth 2 (two) times daily as needed for anxiety. 60 tablet 2  . aspirin EC 81 MG tablet Take 1 tablet (81 mg total) by mouth daily.    [name] atorvastatin (LIPITOR) 20 MG tablet Take 1 tablet (20 mg total) by mouth daily. 90 tablet 3  . budesonide-formoterol (SYMBICORT) 160-4.5 MCG/ACT inhaler Inhale 2 puffs into the lungs 2 (two) times daily. 1 Inhaler 6  . Cholecalciferol (VITAMIN D3) 2000 units capsule Take 1 capsule (2,000 Units total) by mouth daily. 100 capsule 3  . cyclobenzaprine (FLEXERIL) 5 MG tablet Take 1 tablet (5 mg total) by mouth 3 (three) times daily as needed for muscle spasms. 30 tablet 1  . gabapentin (NEURONTIN) 300 MG capsule Take 1-2 capsules (300-600 mg total) by mouth 3 (three) times daily. 180 capsule 1  . isosorbide mononitrate (IMDUR) 30 MG 24 hr tablet Take 1 tablet (30 mg total) by mouth daily. 90 tablet 2  . loratadine (CLARITIN) 10 MG tablet Take 1 tablet (10 mg total) by mouth daily. 100 tablet 3  . losartan (COZAAR) 100 MG tablet Take 1 tablet (100 mg total) by mouth daily. 90 tablet 3  . naproxen (NAPROSYN) 500 MG tablet Take 1 tablet (500 mg total) by mouth daily as needed. 90 tablet 1  . nitroGLYCERIN (NITROSTAT) 0.4 MG SL tablet Place 1 tablet (0.4 mg total) under the tongue every 5 (five) minutes as needed. 20 tablet 3  . oxyCODONE-acetaminophen (PERCOCET)  10-325 MG tablet Take 1 tablet by mouth every 6 (six) hours as needed for pain. 28 tablet 0  . silver sulfADIAZINE (SILVADENE) 1 % cream Apply 1 application topically daily. 50 g 0   No facility-administered medications prior to visit.     ROS Review of Systems  Constitutional: Negative for appetite change, fatigue and unexpected weight change.  HENT: Negative for congestion, nosebleeds, sneezing, sore throat and trouble swallowing.   Eyes: Negative for itching and visual disturbance.  Respiratory: Negative for cough.   Cardiovascular: Negative for chest pain, palpitations and leg swelling.  Gastrointestinal: Negative for abdominal distention, blood in stool, diarrhea and nausea.  Genitourinary: Negative for frequency and hematuria.  Musculoskeletal: Positive for back pain. Negative for gait problem, joint swelling and neck pain.  Skin: Negative for rash.  Neurological: Negative for dizziness, tremors, speech difficulty and weakness.  Psychiatric/Behavioral: Negative for agitation, dysphoric mood and sleep disturbance. The patient is not nervous/anxious.     Objective:  BP 126/78 (BP Location: Left Arm, Patient Position: Sitting, Cuff Size: Large)   Pulse 93   Temp 98.2 F (36.8 C) (Oral)   Ht 6' (1.829 m)   Wt (!) 321 lb (145.6 kg)   SpO2 97%   BMI 43.54 kg/m   BP Readings from Last 3 Encounters:  12/19/16 126/78  11/26/16 126/84  11/07/16 134/86    Wt Readings from  Last 3 Encounters:  12/19/16 (!) 321 lb (145.6 kg)  11/26/16 (!) 322 lb (146.1 kg)  11/07/16 (!) 322 lb (146.1 kg)    Physical Exam  Constitutional: He is oriented to person, place, and time. He appears well-developed. No distress.  NAD  HENT:  Mouth/Throat: Oropharynx is clear and moist.  Eyes: Pupils are equal, round, and reactive to light. Conjunctivae are normal.  Neck: Normal range of motion. No JVD present. No thyromegaly present.  Cardiovascular: Normal rate, regular rhythm, normal heart sounds  and intact distal pulses.  Exam reveals no gallop and no friction rub.   No murmur heard. Pulmonary/Chest: Effort normal and breath sounds normal. No respiratory distress. He has no wheezes. He has no rales. He exhibits no tenderness.  Abdominal: Soft. Bowel sounds are normal. He exhibits no distension and no mass. There is no tenderness. There is no rebound and no guarding.  Musculoskeletal: Normal range of motion. He exhibits tenderness. He exhibits no edema.  Lymphadenopathy:    He has no cervical adenopathy.  Neurological: He is alert and oriented to person, place, and time. He has normal reflexes. No cranial nerve deficit. He exhibits normal muscle tone. He displays a negative Romberg sign. Coordination and gait normal.  Skin: Skin is warm and dry. No rash noted.  Psychiatric: He has a normal mood and affect. His behavior is normal. Judgment and thought content normal.  obese LS tender w/ROM  Lab Results  Component Value Date   WBC 7.5 05/22/2016   HGB 16.2 05/22/2016   HCT 47.5 05/22/2016   PLT 147 05/22/2016   GLUCOSE 121 (H) 05/22/2016   CHOL 126 10/18/2013   TRIG 376.0 (H) 10/18/2013   HDL 36.00 (L) 10/18/2013   LDLDIRECT 48.0 05/28/2012   LDLCALC 15 10/18/2013   ALT 35 05/22/2016   ALT 35 05/22/2016   AST 21 05/22/2016   NA 141 05/22/2016   K 4.2 05/22/2016   CL 103 05/22/2016   CREATININE 0.96 05/22/2016   BUN 15 05/22/2016   CO2 25 05/22/2016   TSH 2.08 05/28/2012   PSA 0.33 02/02/2015   INR 0.9 05/22/2016   HGBA1C 5.8 01/18/2015    Ct Head Wo Contrast  Result Date: 05/03/2016 CLINICAL DATA:  Headache and seizures EXAM: CT HEAD WITHOUT CONTRAST TECHNIQUE: Contiguous axial images were obtained from the base of the skull through the vertex without intravenous contrast. COMPARISON:  None. FINDINGS: Brain: The ventricles are normal in size and configuration. There is borderline cerebellar tonsillar ectopia. There is no intracranial mass, hemorrhage, extra-axial  fluid collection, or midline shift. Gray-white compartments are normal. No acute infarct evident. Vascular: There is no hyperdense vessel. There are scattered foci of calcification in the carotid siphon regions. Skull: The bony calvarium appears intact. Sinuses/Orbits: There are air-fluid levels in each maxillary antrum. There is extensive opacification of multiple ethmoid air cells bilaterally. There is mild mucosal thickening in the inferior right frontal sinus. There is mucosal thickening in the sphenoid sinus region, primarily on the right. Orbits appear symmetric bilaterally. Other: Mastoid air cells are clear. IMPRESSION: Multifocal paranasal sinus disease with air-fluid levels in each maxillary antrum. No intracranial mass hemorrhage, or extra-axial fluid collection. Gray-white compartments appear normal. Note that there is borderline cerebellar tonsillar ectopia. Electronically Signed   By: Lowella Grip III M.D.   On: 05/03/2016 08:38    Assessment & Plan:   There are no diagnoses linked to this encounter. I am having Mr. Ellerby maintain his aspirin EC, isosorbide  mononitrate, Vitamin D3, loratadine, albuterol, budesonide-formoterol, atorvastatin, losartan, naproxen, nitroGLYCERIN, silver sulfADIAZINE, oxyCODONE-acetaminophen, cyclobenzaprine, gabapentin, and ALPRAZolam.  No orders of the defined types were placed in this encounter.    Follow-up: No Follow-up on file.  Walker Kehr, MD

## 2016-12-19 NOTE — Assessment & Plan Note (Signed)
LLE pain has resolved  MRI 2018 FINDINGS:  SEGMENTATION: 5 lumbar vertebra.  DISC LEVELS: - T12-L1: Normal. - L1-L2: Normal.  - L2-L3: Mild facet arthrosis. No stenosis.   - L3-L4: Mild facet arthrosis. Mild circumferential disc bulge. Mild to moderate left neural foraminal stenosis.  - L4-L5: Broad-based posterior disc protrusion. Mild facet arthrosis. Mild central canal stenosis. Mild right neural foraminal stenosis.   - L5-S1: Circumferential disc bulge. Right hypertrophic facet arthrosis. Moderate right neural foraminal stenosis.   OSSEOUS STRUCTURES: No focal destructive osseous lesions. No acute marrow signal abnormality.  SPINAL CORD: No abnormal signal is demonstrated within the visualized distal spinal cord. Cauda equina appears unremarkable. Conus medullaris terminates at L1.  PARASPINAL TISSUES: Intact.  VISUALIZED SACRUM:  Intact.  INCIDENTAL: N/A.   IMPRESSION: Multilevel DDD and facet arthrosis resulting in multilevel neuroforaminal and mild L4-L5 central canal stenosis as detailed above.   Electronically Signed by: Mariea Clonts  Medrol dosepac

## 2016-12-19 NOTE — Assessment & Plan Note (Signed)
Will ref to Dr Junious Silk

## 2016-12-19 NOTE — Assessment & Plan Note (Signed)
Dr.Beasley 

## 2016-12-19 NOTE — Assessment & Plan Note (Addendum)
Percocet  #28 w/caution Gabapentin Medrol pack Wt loss

## 2017-01-09 ENCOUNTER — Ambulatory Visit: Payer: BLUE CROSS/BLUE SHIELD | Admitting: Internal Medicine

## 2017-01-14 ENCOUNTER — Ambulatory Visit: Payer: BLUE CROSS/BLUE SHIELD | Admitting: Internal Medicine

## 2017-01-15 ENCOUNTER — Telehealth: Payer: Self-pay

## 2017-01-15 NOTE — Telephone Encounter (Signed)
Patient is going to call back today to schedule nurse visit or office visit to get shingrix and flu shot---can talk with Perina Salvaggio if any further questions

## 2017-01-31 IMAGING — CT CT HEAD W/O CM
3 series · 15 of 47 positions shown, 18 images · non-contrast
Comparison: None.

CLINICAL DATA: Headache and seizures

EXAM:
CT HEAD WITHOUT CONTRAST
TECHNIQUE: Contiguous axial images were obtained from the base of the skull
through the vertex without intravenous contrast.

[Series 2: head 5.0 h37s · axial · 0.47mm/px · z∈[-110,+15]mm · 9 of 30 slices shown, 12 images]
[im 3/30  brain]
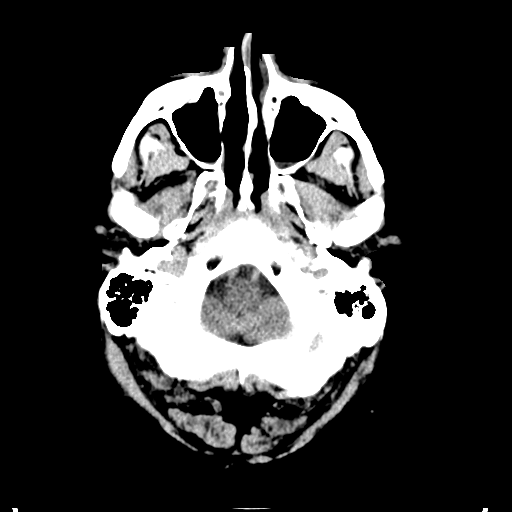
[im 3/30  bone]
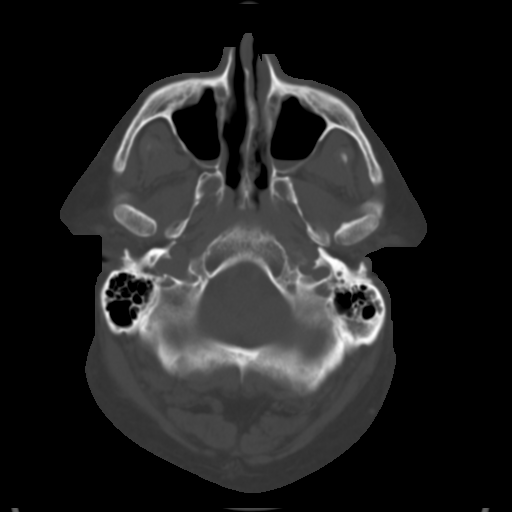
[im 6/30  brain]
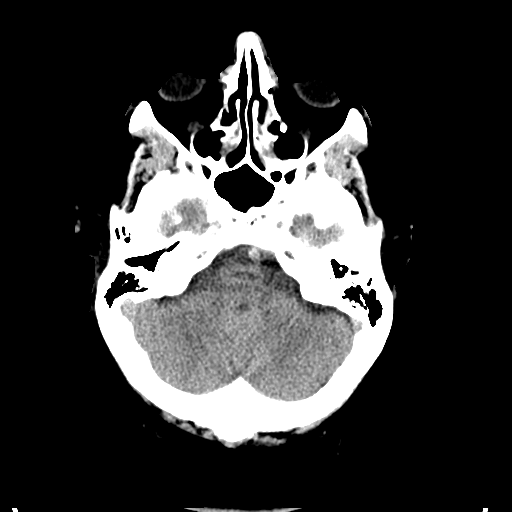
[im 9/30  brain]
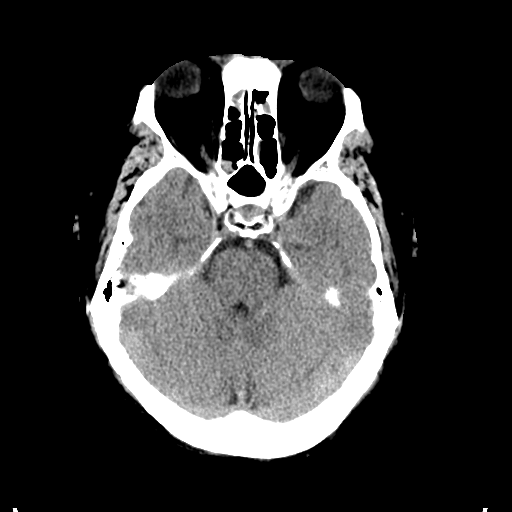
[im 12/30  brain]
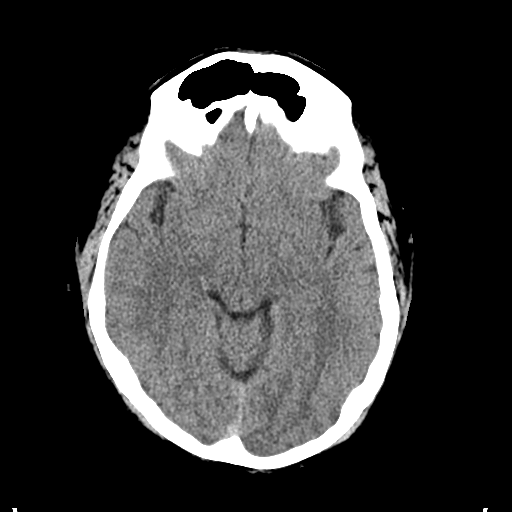
[im 16/30  brain]
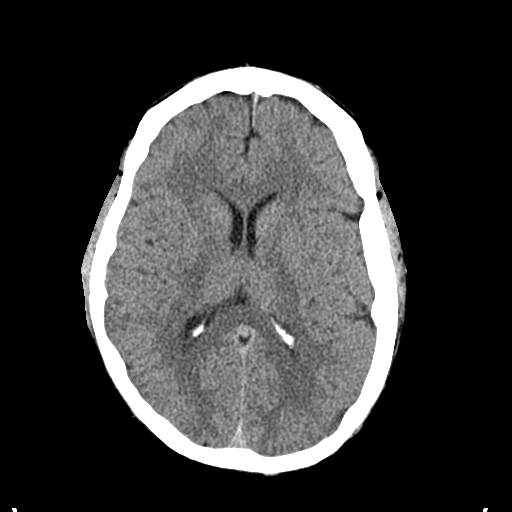
[im 16/30  bone]
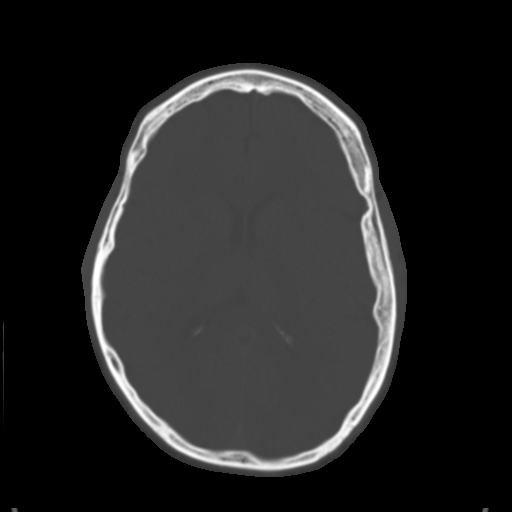
[im 19/30  brain]
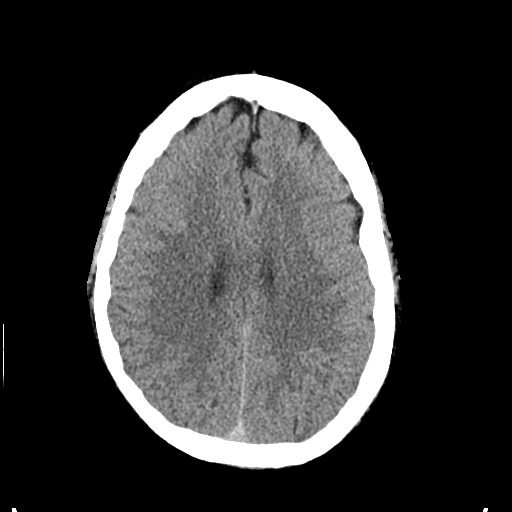
[im 22/30  brain]
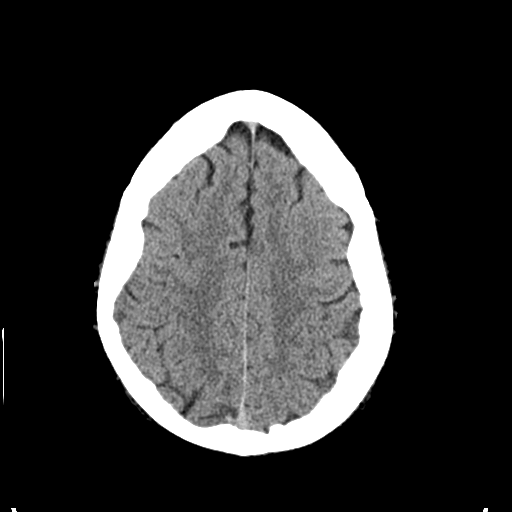
[im 25/30  brain]
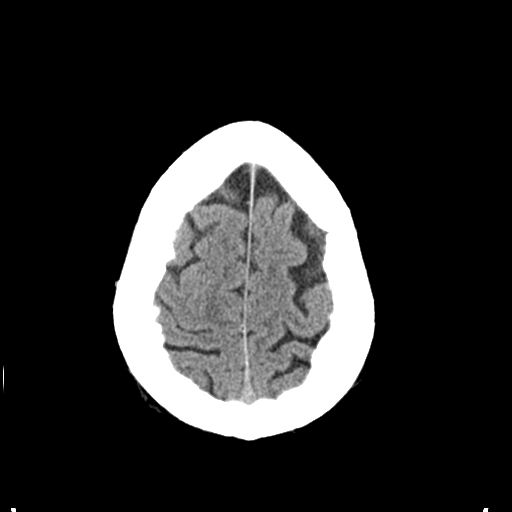
[im 28/30  brain]
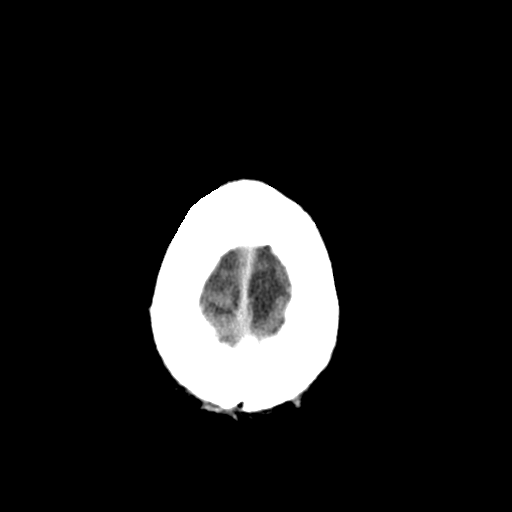
[im 28/30  bone]
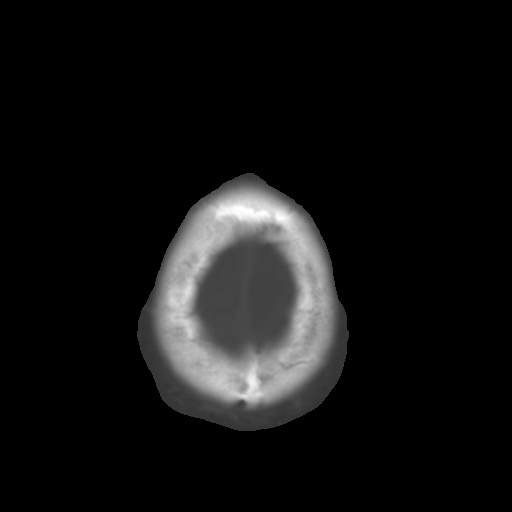

[Series 4: head 3.0 mpr cor · coronal · 0.29mm/px · 3 of 69 slices shown]
[im 23/69  brain]
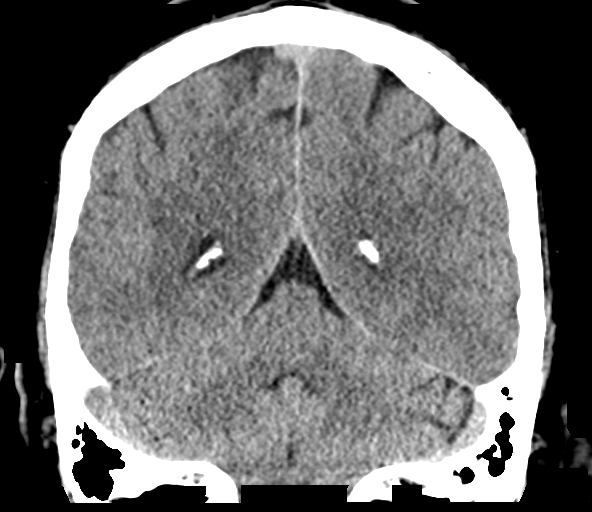
[im 31/69  brain]
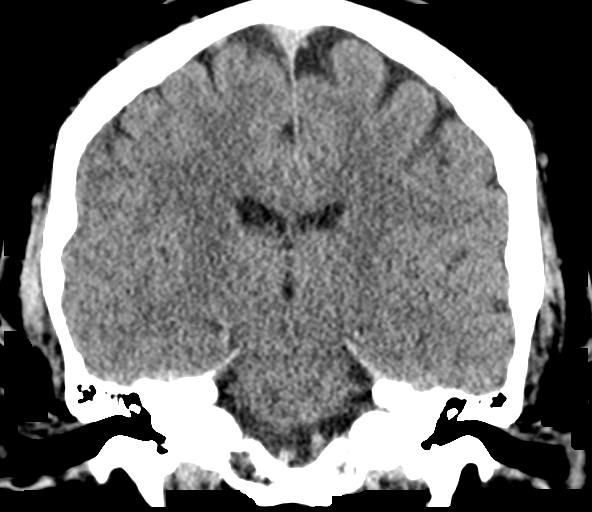
[im 38/69  brain]
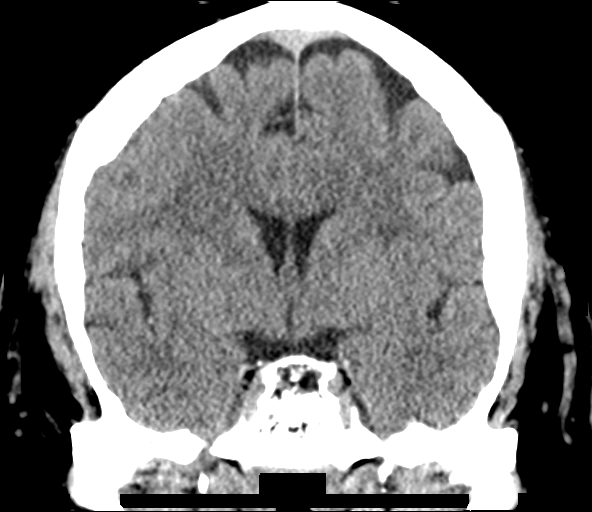

[Series 5: head 3.0 mpr sag · sagittal · 0.29mm/px · 3 of 55 slices shown]
[im 19/55  brain]
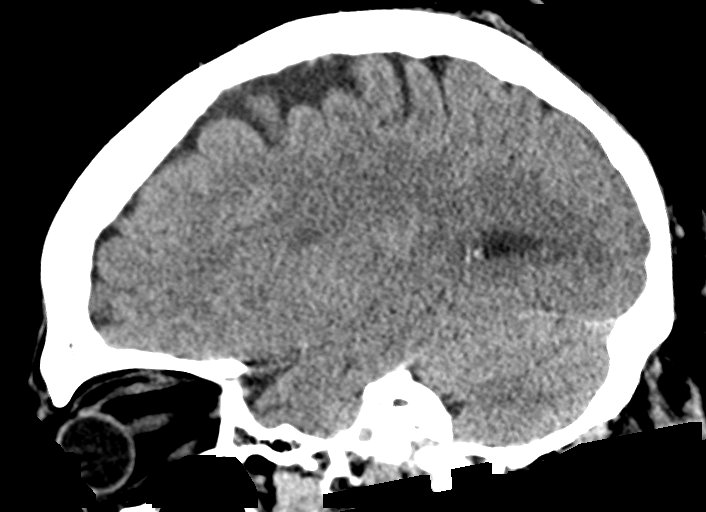
[im 28/55  brain]
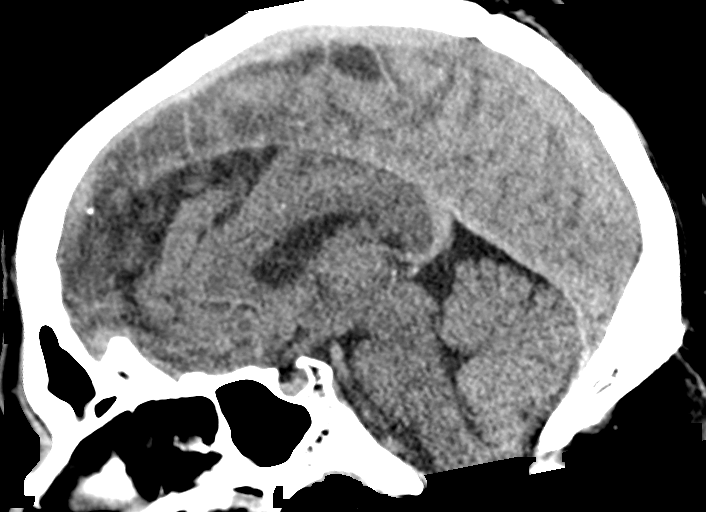
[im 37/55  brain]
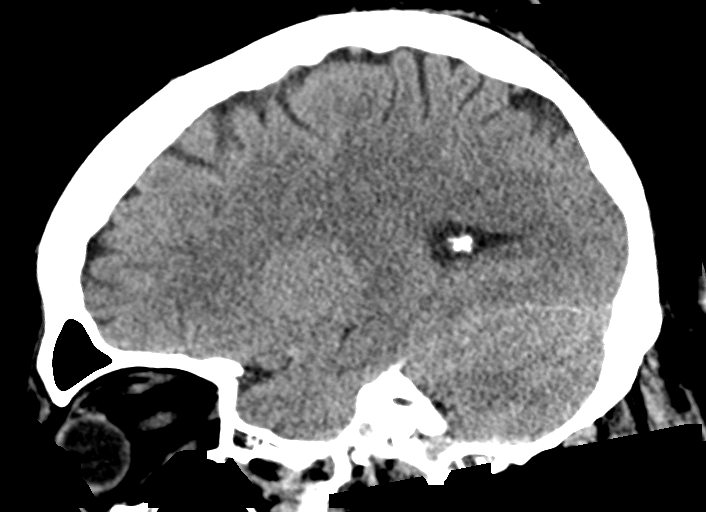

[15 of 47 positions shown; findings below may reference images not displayed]

FINDINGS: Brain: The ventricles are normal in size and configuration. There is
borderline cerebellar tonsillar ectopia. There is no intracranial
mass, hemorrhage, extra-axial fluid collection, or midline shift.
Gray-white compartments are normal. No acute infarct evident.

Vascular: There is no hyperdense vessel. There are scattered foci of
calcification in the carotid siphon regions.

Skull: The bony calvarium appears intact.

Sinuses/Orbits: There are air-fluid levels in each maxillary antrum.
There is extensive opacification of multiple ethmoid air cells
bilaterally. There is mild mucosal thickening in the inferior right
frontal sinus. There is mucosal thickening in the sphenoid sinus
region, primarily on the right. Orbits appear symmetric bilaterally.

Other: Mastoid air cells are clear.
IMPRESSION: Multifocal paranasal sinus disease with air-fluid levels in each
maxillary antrum.

No intracranial mass hemorrhage, or extra-axial fluid collection.
Gray-white compartments appear normal. Note that there is borderline
cerebellar tonsillar ectopia.

## 2017-02-18 DIAGNOSIS — E785 Hyperlipidemia, unspecified: Secondary | ICD-10-CM | POA: Insufficient documentation

## 2017-02-18 DIAGNOSIS — E7800 Pure hypercholesterolemia, unspecified: Secondary | ICD-10-CM | POA: Insufficient documentation

## 2017-02-18 DIAGNOSIS — E1169 Type 2 diabetes mellitus with other specified complication: Secondary | ICD-10-CM | POA: Insufficient documentation

## 2017-02-21 ENCOUNTER — Emergency Department (HOSPITAL_BASED_OUTPATIENT_CLINIC_OR_DEPARTMENT_OTHER): Payer: BLUE CROSS/BLUE SHIELD

## 2017-02-21 ENCOUNTER — Emergency Department (HOSPITAL_BASED_OUTPATIENT_CLINIC_OR_DEPARTMENT_OTHER)
Admission: EM | Admit: 2017-02-21 | Discharge: 2017-02-21 | Discharge status: Against Medical Advice | Payer: BLUE CROSS/BLUE SHIELD | Attending: Emergency Medicine | Admitting: Emergency Medicine

## 2017-02-21 ENCOUNTER — Encounter (HOSPITAL_BASED_OUTPATIENT_CLINIC_OR_DEPARTMENT_OTHER): Payer: Self-pay | Admitting: Emergency Medicine

## 2017-02-21 DIAGNOSIS — I10 Essential (primary) hypertension: Secondary | ICD-10-CM | POA: Diagnosis not present

## 2017-02-21 DIAGNOSIS — R42 Dizziness and giddiness: Secondary | ICD-10-CM | POA: Diagnosis not present

## 2017-02-21 DIAGNOSIS — E119 Type 2 diabetes mellitus without complications: Secondary | ICD-10-CM | POA: Insufficient documentation

## 2017-02-21 DIAGNOSIS — J449 Chronic obstructive pulmonary disease, unspecified: Secondary | ICD-10-CM | POA: Insufficient documentation

## 2017-02-21 DIAGNOSIS — Z7982 Long term (current) use of aspirin: Secondary | ICD-10-CM | POA: Diagnosis not present

## 2017-02-21 DIAGNOSIS — R110 Nausea: Secondary | ICD-10-CM | POA: Diagnosis not present

## 2017-02-21 DIAGNOSIS — F17210 Nicotine dependence, cigarettes, uncomplicated: Secondary | ICD-10-CM | POA: Diagnosis not present

## 2017-02-21 DIAGNOSIS — I252 Old myocardial infarction: Secondary | ICD-10-CM | POA: Diagnosis not present

## 2017-02-21 DIAGNOSIS — I2510 Atherosclerotic heart disease of native coronary artery without angina pectoris: Secondary | ICD-10-CM | POA: Insufficient documentation

## 2017-02-21 DIAGNOSIS — Z79899 Other long term (current) drug therapy: Secondary | ICD-10-CM | POA: Diagnosis not present

## 2017-02-21 DIAGNOSIS — R0602 Shortness of breath: Secondary | ICD-10-CM | POA: Insufficient documentation

## 2017-02-21 DIAGNOSIS — R0789 Other chest pain: Secondary | ICD-10-CM | POA: Insufficient documentation

## 2017-02-21 DIAGNOSIS — R079 Chest pain, unspecified: Secondary | ICD-10-CM | POA: Diagnosis present

## 2017-02-21 DIAGNOSIS — Z5320 Procedure and treatment not carried out because of patient's decision for unspecified reasons: Secondary | ICD-10-CM | POA: Diagnosis not present

## 2017-02-21 LAB — BASIC METABOLIC PANEL
ANION GAP: 8 (ref 5–15)
BUN: 15 mg/dL (ref 6–20)
CO2: 26 mmol/L (ref 22–32)
Calcium: 9.5 mg/dL (ref 8.9–10.3)
Chloride: 103 mmol/L (ref 101–111)
Creatinine, Ser: 0.96 mg/dL (ref 0.61–1.24)
GFR calc Af Amer: >60 mL/min (ref >60)
Glucose, Bld: 124 mg/dL — ABNORMAL HIGH (ref 65–99)
POTASSIUM: 4.6 mmol/L (ref 3.5–5.1)
SODIUM: 137 mmol/L (ref 135–145)

## 2017-02-21 LAB — BRAIN NATRIURETIC PEPTIDE: B NATRIURETIC PEPTIDE 5: 9.1 pg/mL (ref 0.0–100.0)

## 2017-02-21 LAB — CBC
HEMATOCRIT: 45.4 % (ref 39.0–52.0)
Hemoglobin: 15.4 g/dL (ref 13.0–17.0)
MCH: 30.9 pg (ref 26.0–34.0)
MCHC: 33.9 g/dL (ref 30.0–36.0)
MCV: 91.2 fL (ref 78.0–100.0)
Platelets: 150 10*3/uL (ref 150–400)
RBC: 4.98 MIL/uL (ref 4.22–5.81)
RDW: 14 % (ref 11.5–15.5)
WBC: 9 10*3/uL (ref 4.0–10.5)

## 2017-02-21 LAB — TROPONIN I: Troponin I: <0.03 ng/mL (ref <0.03)

## 2017-02-21 MED ORDER — HEPARIN (PORCINE) IN NACL 100-0.45 UNIT/ML-% IJ SOLN: 1500.0000 [IU]/h | INTRAMUSCULAR | Status: DC

## 2017-02-21 MED ORDER — HEPARIN BOLUS VIA INFUSION: 4000.0000 [IU] | Freq: Once | INTRAVENOUS | Status: DC

## 2017-02-21 MED ORDER — FUROSEMIDE 10 MG/ML IJ SOLN: 20.0000 mg | Freq: Once | INTRAMUSCULAR | Status: DC

## 2017-02-21 MED ORDER — NITROGLYCERIN IN D5W 200-5 MCG/ML-% IV SOLN: 0.0000 ug/min | INTRAVENOUS | Status: DC

## 2017-02-21 MED ORDER — CLINDAMYCIN PHOSPHATE 600 MG/50ML IV SOLN: 600.0000 mg | Freq: Once | INTRAVENOUS | Status: DC

## 2017-02-21 MED ORDER — NITROGLYCERIN 2 % TD OINT
1.0000 [in_us] | TOPICAL_OINTMENT | Freq: Four times a day (QID) | TRANSDERMAL | Status: DC
  Filled 2017-02-21: qty 1

## 2017-02-21 MED ORDER — ASPIRIN 81 MG PO CHEW
324.0000 mg | CHEWABLE_TABLET | Freq: Once | ORAL | Status: AC
  Administered 2017-02-21: 243 mg via ORAL
  Filled 2017-02-21: qty 4
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
## 2017-02-21 NOTE — ED Triage Notes (Signed)
Reports chest pain that started this morning.  States took 1 nitro this morning with some relief and then the pain returned while he was getting his hair cut.
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
## 2017-02-21 NOTE — ED Notes (Addendum)
Attempted IV stick x 2 by myself and 1 by Vernie Shanks, PA.  Unsuccessful x 3.  Patient requesting "please give me a break for a while".  PA notified.

## 2017-02-21 NOTE — ED Triage Notes (Signed)
Left sided chest pain radiating down left arm and up neck.  Some sob, dizziness.

## 2017-02-21 NOTE — Progress Notes (Signed)
ANTICOAGULATION CONSULT NOTE - Initial Consult  Pharmacy Consult for heparin dosing Indication: chest pain/ACS  Allergies  Allergen Reactions  . Ace Inhibitors   . Lisinopril     cough  . Tramadol     Pt had seizures while on it    Patient Measurements: Height: 6' (182.9 cm) Weight: (!) 320 lb (145.2 kg) IBW/kg (Calculated) : 77.6 Heparin Dosing Weight: 111.4  Vital Signs: Temp: 97.7 F (36.5 C) (10/12 1006) Temp Source: Oral (10/12 1006) BP: 137/85 (10/12 1100) Pulse Rate: 98 (10/12 1100)  Labs:  Recent Labs  02/21/17 1034  CREATININE 0.96    Estimated Creatinine Clearance: 130.1 mL/min (by C-G formula based on SCr of 0.96 mg/dL).   Medical History: Past Medical History:  Diagnosis Date  . Anxiety   . CAD (coronary artery disease)   . Cocaine abuse ([place])    none now  . Diabetes mellitus     II, borderline, diet controlled  . GERD (gastroesophageal reflux disease)   . Hyperlipidemia   . Myocardial infarction [facility]) "distant past"   related to cocaine abuse  . Obesity, unspecified   . OSA (obstructive sleep apnea)    cpap -PSG apr30 2009, AHI  119  . Pain, low back   . Tobacco use disorder     Medications:  Scheduled:  . aspirin  324 mg Oral Once    Assessment: 51 YOM admitted with chest pain.  Troponin WNL, CBC pending, EKG no significant change since last EKG. No anticoag PTA, no bleeding reported. PMH of CAD.  Goal of Therapy:  Heparin level 0.3-0.7 units/ml Monitor platelets by anticoagulation protocol: Yes   Plan:  Give heparin 4000 units bolus x 1 Start heparin infusion at 1500 units/hr  6 hour heparin level, daily heparin level and CBC Monitor s/sx of bleeding.   Jerrye Noble, PharmD Candidate 02/21/2017,11:22 AM

## 2017-02-21 NOTE — ED Notes (Signed)
Pt on monitor 

## 2017-02-25 ENCOUNTER — Telehealth: Payer: Self-pay | Admitting: Internal Medicine

## 2017-02-25 MED ORDER — NAPROXEN 500 MG PO TABS: 500.0000 mg | ORAL_TABLET | Freq: Every day | ORAL | 0 refills | Status: DC | PRN

## 2017-02-25 MED ORDER — ALPRAZOLAM 1 MG PO TABS: 0.5000 mg | ORAL_TABLET | Freq: Two times a day (BID) | ORAL | 0 refills | Status: DC | PRN

## 2017-02-25 NOTE — Telephone Encounter (Signed)
Needs to pick up with UDS given last inappropriate (no xanax) more than 1 year ago. Also overdue for follow up and needs to schedule visit with PCP.

## 2017-02-25 NOTE — Telephone Encounter (Signed)
Called pt no answer LMOM w/MD response.When you come in to pick-up rx pls make cpx w/PCP...[name]

## 2017-02-25 NOTE — Telephone Encounter (Signed)
MD out of office this week. Check [place] registry last filled alprazolam 01/25/2017. Sent naproxen pls advise on control,,,/lmb

## 2017-02-25 NOTE — Telephone Encounter (Signed)
Patient called for a refill of his  ALPRAZolam (XANAX) 1 MG tablet And  naproxen (NAPROSYN) 500 MG tablet Please advise  Please send to [facility]

## 2017-02-26 NOTE — Telephone Encounter (Signed)
Called pt per MD (crawford) need to have UDS prior picking up script last UDS done back in 2016. Pt states not able to come in due to work schedule asking if rx can be called in. Inform pt will have to do UDS at cpx w/ pcp or no future refills will be given. Called rx into [facility]...[name]

## 2017-02-26 NOTE — Telephone Encounter (Signed)
Pharmacy called and never received the patients ALPRAZolam (XANAX) 1 MG tablet Please resend

## 2017-03-31 ENCOUNTER — Other Ambulatory Visit: Payer: Self-pay | Admitting: Internal Medicine

## 2017-03-31 NOTE — Telephone Encounter (Signed)
Routing to dr plotnikov, please advise, thanks 

## 2017-05-13 ENCOUNTER — Encounter (HOSPITAL_BASED_OUTPATIENT_CLINIC_OR_DEPARTMENT_OTHER): Payer: Self-pay | Admitting: Emergency Medicine

## 2017-05-13 ENCOUNTER — Emergency Department (HOSPITAL_BASED_OUTPATIENT_CLINIC_OR_DEPARTMENT_OTHER)
Admission: EM | Admit: 2017-05-13 | Discharge: 2017-05-13 | Disposition: A | Discharge status: Home / Self Care | Payer: Self-pay | Attending: Emergency Medicine | Admitting: Emergency Medicine

## 2017-05-13 ENCOUNTER — Emergency Department (HOSPITAL_BASED_OUTPATIENT_CLINIC_OR_DEPARTMENT_OTHER): Payer: Self-pay

## 2017-05-13 ENCOUNTER — Other Ambulatory Visit: Payer: Self-pay

## 2017-05-13 DIAGNOSIS — S52571A Other intraarticular fracture of lower end of right radius, initial encounter for closed fracture: Secondary | ICD-10-CM | POA: Insufficient documentation

## 2017-05-13 DIAGNOSIS — Y9389 Activity, other specified: Secondary | ICD-10-CM | POA: Insufficient documentation

## 2017-05-13 DIAGNOSIS — W19XXXA Unspecified fall, initial encounter: Secondary | ICD-10-CM

## 2017-05-13 DIAGNOSIS — Y999 Unspecified external cause status: Secondary | ICD-10-CM | POA: Insufficient documentation

## 2017-05-13 DIAGNOSIS — Z79899 Other long term (current) drug therapy: Secondary | ICD-10-CM | POA: Insufficient documentation

## 2017-05-13 DIAGNOSIS — Z7982 Long term (current) use of aspirin: Secondary | ICD-10-CM | POA: Insufficient documentation

## 2017-05-13 DIAGNOSIS — W010XXA Fall on same level from slipping, tripping and stumbling without subsequent striking against object, initial encounter: Secondary | ICD-10-CM | POA: Insufficient documentation

## 2017-05-13 DIAGNOSIS — E119 Type 2 diabetes mellitus without complications: Secondary | ICD-10-CM | POA: Insufficient documentation

## 2017-05-13 DIAGNOSIS — I10 Essential (primary) hypertension: Secondary | ICD-10-CM | POA: Insufficient documentation

## 2017-05-13 DIAGNOSIS — I2510 Atherosclerotic heart disease of native coronary artery without angina pectoris: Secondary | ICD-10-CM | POA: Insufficient documentation

## 2017-05-13 DIAGNOSIS — F17210 Nicotine dependence, cigarettes, uncomplicated: Secondary | ICD-10-CM | POA: Insufficient documentation

## 2017-05-13 DIAGNOSIS — J449 Chronic obstructive pulmonary disease, unspecified: Secondary | ICD-10-CM | POA: Insufficient documentation

## 2017-05-13 DIAGNOSIS — Y929 Unspecified place or not applicable: Secondary | ICD-10-CM | POA: Insufficient documentation

## 2017-05-13 NOTE — ED Notes (Signed)
Patient transported to X-ray 
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
## 2017-05-13 NOTE — ED Triage Notes (Signed)
Pt tripped over his dog and fell backwards yesterday.  Pt c/o R wrist pain.

## 2017-05-16 ENCOUNTER — Encounter: Payer: Self-pay | Admitting: Family Medicine

## 2017-05-16 ENCOUNTER — Ambulatory Visit (INDEPENDENT_AMBULATORY_CARE_PROVIDER_SITE_OTHER): Payer: Self-pay | Admitting: Family Medicine

## 2017-05-16 DIAGNOSIS — S6991XA Unspecified injury of right wrist, hand and finger(s), initial encounter: Secondary | ICD-10-CM

## 2017-05-16 NOTE — Patient Instructions (Signed)
You have a distal radius fracture. Continue wearing the splint for another week (or just less than this) and follow up with me next Thursday or Friday. We will remove the splint, repeat x-rays and either put you in a cast or an EXOS brace. Elevate above your heart when possible. Tylenol, ibuprofen if needed for pain. Expect this to take a total of 6 weeks to heal.

## 2017-05-21 ENCOUNTER — Encounter: Payer: Self-pay | Admitting: Family Medicine

## 2017-05-21 DIAGNOSIS — S6991XD Unspecified injury of right wrist, hand and finger(s), subsequent encounter: Secondary | ICD-10-CM | POA: Insufficient documentation

## 2017-05-21 HISTORY — DX: Unspecified injury of right wrist, hand and finger(s), subsequent encounter: S69.91XD

## 2017-05-21 NOTE — Progress Notes (Signed)
PCP: Plotnikov, Evie Lacks, MD  Subjective:   HPI: Patient is a 57 y.o. male here for right wrist injury.  Patient reports on 12/31 he was trying to separate two dogs who were fighting when he fell backwards sustaining [place] injury to right wrist. Immediate pain, swelling distal right wrist dorsally. Pain level 2/10, sharp. Feels better in splint he's wearing from ED. No other skin changes. No numbness.  Past Medical History:  Diagnosis Date  . Anxiety   . CAD (coronary artery disease)   . Cocaine abuse ([place])    none now  . Diabetes mellitus     II, borderline, diet controlled  . GERD (gastroesophageal reflux disease)   . Hyperlipidemia   . Myocardial infarction [facility]) "distant past"   related to cocaine abuse  . Obesity, unspecified   . OSA (obstructive sleep apnea)    cpap -PSG apr30 2009, AHI  119  . Pain, low back   . Tobacco use disorder     Current Outpatient Medications on File Prior to Visit  Medication Sig Dispense Refill  . albuterol (PROVENTIL HFA;VENTOLIN HFA) 108 (90 Base) MCG/ACT inhaler Inhale 2 puffs into the lungs every 6 (six) hours as needed for wheezing or shortness of breath. 1 Inhaler 2  . ALPRAZolam (XANAX) 1 MG tablet TAKE 1/2 TO 1 TABLET BY MOUTH TWICE DAILY AS NEEDED FOR ANXIETY 60 tablet 2  . aspirin EC 81 MG tablet Take 1 tablet (81 mg total) by mouth daily.    [name] atorvastatin (LIPITOR) 20 MG tablet Take 1 tablet (20 mg total) by mouth daily. 90 tablet 3  . budesonide-formoterol (SYMBICORT) 160-4.5 MCG/ACT inhaler Inhale 2 puffs into the lungs 2 (two) times daily. 1 Inhaler 6  . Cholecalciferol (VITAMIN D3) 2000 units capsule Take 1 capsule (2,000 Units total) by mouth daily. 100 capsule 3  . cyclobenzaprine (FLEXERIL) 5 MG tablet Take 1 tablet (5 mg total) by mouth 3 (three) times daily as needed for muscle spasms. 30 tablet 1  . gabapentin (NEURONTIN) 300 MG capsule Take 1-2 capsules (300-600 mg total) by mouth 3 (three) times daily. 180 capsule 1   . isosorbide mononitrate (IMDUR) 30 MG 24 hr tablet Take 1 tablet (30 mg total) by mouth daily. 90 tablet 2  . loratadine (CLARITIN) 10 MG tablet Take 1 tablet (10 mg total) by mouth daily. 100 tablet 3  . losartan (COZAAR) 100 MG tablet Take 1 tablet (100 mg total) by mouth daily. 90 tablet 3  . methylPREDNISolone (MEDROL DOSEPAK) 4 MG TBPK tablet As directed 21 tablet 0  . naproxen (NAPROSYN) 500 MG tablet Take 1 tablet (500 mg total) by mouth daily as needed. 90 tablet 0  . nitroGLYCERIN (NITROSTAT) 0.4 MG SL tablet Place 1 tablet (0.4 mg total) under the tongue every 5 (five) minutes as needed. 20 tablet 3  . oxyCODONE-acetaminophen (PERCOCET) 10-325 MG tablet Take 1 tablet by mouth every 6 (six) hours as needed for pain. 28 tablet 0  . silver sulfADIAZINE (SILVADENE) 1 % cream Apply 1 application topically daily. 50 g 0   No current facility-administered medications on file prior to visit.     Past Surgical History:  Procedure Laterality Date  . BACK SURGERY     x 2  . FRACTURE SURGERY     R. leg  . FRACTURE SURGERY     L. arm  . LEFT HEART CATHETERIZATION WITH CORONARY ANGIOGRAM N/A 11/22/2013   Procedure: LEFT HEART CATHETERIZATION WITH CORONARY ANGIOGRAM;  Surgeon: Kirk Ruths  Claris Gladden, MD;  Location: [facility] CATH LAB;  Service: Cardiovascular;  Laterality: N/A;    Allergies  Allergen Reactions  . Ace Inhibitors   . Lisinopril     cough  . Tramadol     Pt had seizures while on it    Social History   Socioeconomic History  . Marital status: Married    Spouse name: Not on file  . Number of children: 2  . Years of education: Not on file  . Highest education level: Not on file  Social Needs  . Financial resource strain: Not on file  . Food insecurity - worry: Not on file  . Food insecurity - inability: Not on file  . Transportation needs - medical: Not on file  . Transportation needs - non-medical: Not on file  Occupational History  . Occupation: Leisure centre manager  Tobacco Use  .  Smoking status: Current Every Day Smoker    Packs/day: 2.00    Years: 25.00    Pack years: 50.00    Types: Cigarettes  . Smokeless tobacco: Never Used  Substance and Sexual Activity  . Alcohol use: Yes    Alcohol/week: 3.6 oz    Types: 6 Standard drinks or equivalent per week    Comment: 6 drinks weekly  . Drug use: Yes    Types: Marijuana    Comment: cocaine abuse yrs ago. none now.  . Sexual activity: Yes  Other Topics Concern  . Not on file  Social History Narrative  . Not on file    Family History  Problem Relation Age of Onset  . Heart attack Father 69       grandfather died MI 82  . Heart disease Father        cad  . Cancer Mother        lung ca  . Heart attack Unknown   . Atrial fibrillation Other     BP 117/77   Pulse (!) 105   Ht 6' (1.829 m)   Wt (!) 320 lb (145.2 kg)   BMI 43.40 kg/m   Review of Systems: See HPI above.     Objective:  Physical Exam:  Gen: NAD, comfortable in exam room  Right wrist: Splint not removed today. No distal swelling or bruising. FROM digits with 5/5 strength. Cap refill < 2 sec. Sensation intact to light touch.  Left wrist: FROM without pain. Assessment & Plan:  1. Right wrist injury - Independently reviewed radiographs and noted distal radius fracture on dorsal aspect with intraarticular extension, small amount of displacement but should heal with conservative treatment.  Continue splint for another week and follow up with me at that time - plan to remove splint, repeat x-rays, transition to cast.  Tylenol, ibuprofen if needed.  F/u in 6 weeks.

## 2017-05-21 NOTE — Assessment & Plan Note (Signed)
Independently reviewed radiographs and noted distal radius fracture on dorsal aspect with intraarticular extension, small amount of displacement but should heal with conservative treatment.  Continue splint for another week and follow up with me at that time - plan to remove splint, repeat x-rays, transition to cast.  Tylenol, ibuprofen if needed.  F/u in 6 weeks.

## 2017-05-22 ENCOUNTER — Ambulatory Visit (INDEPENDENT_AMBULATORY_CARE_PROVIDER_SITE_OTHER): Payer: Self-pay | Admitting: Family Medicine

## 2017-05-22 ENCOUNTER — Ambulatory Visit (HOSPITAL_BASED_OUTPATIENT_CLINIC_OR_DEPARTMENT_OTHER)
Admission: RE | Admit: 2017-05-22 | Discharge: 2017-05-22 | Disposition: A | Discharge status: Home / Self Care | Payer: Self-pay | Source: Ambulatory Visit | Attending: Family Medicine | Admitting: Family Medicine

## 2017-05-22 ENCOUNTER — Encounter: Payer: Self-pay | Admitting: Family Medicine

## 2017-05-22 VITALS — BP 121/85 | HR 96 | Ht 72.0 in | Wt 320.0 lb

## 2017-05-22 DIAGNOSIS — X58XXXA Exposure to other specified factors, initial encounter: Secondary | ICD-10-CM | POA: Insufficient documentation

## 2017-05-22 DIAGNOSIS — S6991XA Unspecified injury of right wrist, hand and finger(s), initial encounter: Secondary | ICD-10-CM | POA: Insufficient documentation

## 2017-05-22 DIAGNOSIS — S52501A Unspecified fracture of the lower end of right radius, initial encounter for closed fracture: Secondary | ICD-10-CM | POA: Insufficient documentation

## 2017-05-22 DIAGNOSIS — S6991XD Unspecified injury of right wrist, hand and finger(s), subsequent encounter: Secondary | ICD-10-CM

## 2017-05-22 NOTE — Assessment & Plan Note (Signed)
independently reviewed radiographs and no interval change of distal radius fracture on dorsal aspect with intraarticular extension - measured 3.5mm displacement at largest extent distally.  Clinically has minimal pain.  Discussed cast vs EXOS - placed in EXOS brace.  Tylenol, ibuprofen if needed.  F/u in 2 weeks.

## 2017-05-22 NOTE — Progress Notes (Signed)
PCP: Plotnikov, Evie Lacks, MD  Subjective:   HPI: Patient is a 57 y.o. male here for right wrist injury.  1/4: Patient reports on 12/31 he was trying to separate two dogs who were fighting when he fell backwards sustaining [place] injury to right wrist. Immediate pain, swelling distal right wrist dorsally. Pain level 2/10, sharp. Feels better in splint he's wearing from ED. No other skin changes. No numbness.  1/10: Patient reports he's doing well. Tolerating splint. Pain level 0/10. Not taking anything for pain. No skin changes, numbness.  Past Medical History:  Diagnosis Date  . Anxiety   . CAD (coronary artery disease)   . Cocaine abuse ([place])    none now  . Diabetes mellitus     II, borderline, diet controlled  . GERD (gastroesophageal reflux disease)   . Hyperlipidemia   . Myocardial infarction [facility]) "distant past"   related to cocaine abuse  . Obesity, unspecified   . OSA (obstructive sleep apnea)    cpap -PSG apr30 2009, AHI  119  . Pain, low back   . Tobacco use disorder     Current Outpatient Medications on File Prior to Visit  Medication Sig Dispense Refill  . albuterol (PROVENTIL HFA;VENTOLIN HFA) 108 (90 Base) MCG/ACT inhaler Inhale 2 puffs into the lungs every 6 (six) hours as needed for wheezing or shortness of breath. 1 Inhaler 2  . ALPRAZolam (XANAX) 1 MG tablet TAKE 1/2 TO 1 TABLET BY MOUTH TWICE DAILY AS NEEDED FOR ANXIETY 60 tablet 2  . aspirin EC 81 MG tablet Take 1 tablet (81 mg total) by mouth daily.    [name] atorvastatin (LIPITOR) 20 MG tablet Take 1 tablet (20 mg total) by mouth daily. 90 tablet 3  . budesonide-formoterol (SYMBICORT) 160-4.5 MCG/ACT inhaler Inhale 2 puffs into the lungs 2 (two) times daily. 1 Inhaler 6  . Cholecalciferol (VITAMIN D3) 2000 units capsule Take 1 capsule (2,000 Units total) by mouth daily. 100 capsule 3  . cyclobenzaprine (FLEXERIL) 5 MG tablet Take 1 tablet (5 mg total) by mouth 3 (three) times daily as needed for muscle  spasms. 30 tablet 1  . gabapentin (NEURONTIN) 300 MG capsule Take 1-2 capsules (300-600 mg total) by mouth 3 (three) times daily. 180 capsule 1  . isosorbide mononitrate (IMDUR) 30 MG 24 hr tablet Take 1 tablet (30 mg total) by mouth daily. 90 tablet 2  . loratadine (CLARITIN) 10 MG tablet Take 1 tablet (10 mg total) by mouth daily. 100 tablet 3  . losartan (COZAAR) 100 MG tablet Take 1 tablet (100 mg total) by mouth daily. 90 tablet 3  . methylPREDNISolone (MEDROL DOSEPAK) 4 MG TBPK tablet As directed 21 tablet 0  . naproxen (NAPROSYN) 500 MG tablet Take 1 tablet (500 mg total) by mouth daily as needed. 90 tablet 0  . nitroGLYCERIN (NITROSTAT) 0.4 MG SL tablet Place 1 tablet (0.4 mg total) under the tongue every 5 (five) minutes as needed. 20 tablet 3  . oxyCODONE-acetaminophen (PERCOCET) 10-325 MG tablet Take 1 tablet by mouth every 6 (six) hours as needed for pain. 28 tablet 0  . silver sulfADIAZINE (SILVADENE) 1 % cream Apply 1 application topically daily. 50 g 0   No current facility-administered medications on file prior to visit.     Past Surgical History:  Procedure Laterality Date  . BACK SURGERY     x 2  . FRACTURE SURGERY     R. leg  . FRACTURE SURGERY     L. arm  .  LEFT HEART CATHETERIZATION WITH CORONARY ANGIOGRAM N/A 11/22/2013   Procedure: LEFT HEART CATHETERIZATION WITH CORONARY ANGIOGRAM;  Surgeon: Larey Dresser, MD;  Location: [facility] CATH LAB;  Service: Cardiovascular;  Laterality: N/A;    Allergies  Allergen Reactions  . Ace Inhibitors   . Lisinopril     cough  . Tramadol     Pt had seizures while on it    Social History   Socioeconomic History  . Marital status: Married    Spouse name: Not on file  . Number of children: 2  . Years of education: Not on file  . Highest education level: Not on file  Social Needs  . Financial resource strain: Not on file  . Food insecurity - worry: Not on file  . Food insecurity - inability: Not on file  . Transportation  needs - medical: Not on file  . Transportation needs - non-medical: Not on file  Occupational History  . Occupation: Leisure centre manager  Tobacco Use  . Smoking status: Current Every Day Smoker    Packs/day: 2.00    Years: 25.00    Pack years: 50.00    Types: Cigarettes  . Smokeless tobacco: Never Used  Substance and Sexual Activity  . Alcohol use: Yes    Alcohol/week: 3.6 oz    Types: 6 Standard drinks or equivalent per week    Comment: 6 drinks weekly  . Drug use: Yes    Types: Marijuana    Comment: cocaine abuse yrs ago. none now.  . Sexual activity: Yes  Other Topics Concern  . Not on file  Social History Narrative  . Not on file    Family History  Problem Relation Age of Onset  . Heart attack Father 3       grandfather died MI 71  . Heart disease Father        cad  . Cancer Mother        lung ca  . Heart attack Unknown   . Atrial fibrillation Other     BP 121/85   Pulse 96   Ht 6' (1.829 m)   Wt (!) 320 lb (145.2 kg)   BMI 43.40 kg/m   Review of Systems: See HPI above.     Objective:  Physical Exam:  Gen: NAD, comfortable in exam room.  Right wrist: Splint removed. No swelling or bruising noted. FROM digits with 5/5 strength.  Wrist motion not tested. Mild TTP distal radius dorsally.  No other tenderness. NVI distally.  Assessment & Plan:  1. Right wrist injury - independently reviewed radiographs and no interval change of distal radius fracture on dorsal aspect with intraarticular extension - measured 3.10mm displacement at largest extent distally.  Clinically has minimal pain.  Discussed cast vs EXOS - placed in EXOS brace.  Tylenol, ibuprofen if needed.  F/u in 2 weeks.

## 2017-05-22 NOTE — Patient Instructions (Signed)
You have a distal radius fracture. Wear the EXOS brace for 2 weeks then follow up with me to repeat your x-rays and exam. Make sure you dry this with a hair dryer if it gets wet. Elevate above your heart when possible. Tylenol, ibuprofen if needed for pain. Expect this to take a total of 6 weeks to heal.

## 2017-06-05 ENCOUNTER — Ambulatory Visit (HOSPITAL_BASED_OUTPATIENT_CLINIC_OR_DEPARTMENT_OTHER)
Admission: RE | Admit: 2017-06-05 | Discharge: 2017-06-05 | Disposition: A | Discharge status: Home / Self Care | Payer: Self-pay | Source: Ambulatory Visit | Attending: Family Medicine | Admitting: Family Medicine

## 2017-06-05 ENCOUNTER — Encounter: Payer: Self-pay | Admitting: Family Medicine

## 2017-06-05 ENCOUNTER — Ambulatory Visit (INDEPENDENT_AMBULATORY_CARE_PROVIDER_SITE_OTHER): Payer: Self-pay | Admitting: Family Medicine

## 2017-06-05 VITALS — BP 117/77 | HR 106 | Ht 72.0 in | Wt 320.0 lb

## 2017-06-05 DIAGNOSIS — X58XXXA Exposure to other specified factors, initial encounter: Secondary | ICD-10-CM | POA: Insufficient documentation

## 2017-06-05 DIAGNOSIS — S52591A Other fractures of lower end of right radius, initial encounter for closed fracture: Secondary | ICD-10-CM | POA: Insufficient documentation

## 2017-06-05 DIAGNOSIS — S6991XD Unspecified injury of right wrist, hand and finger(s), subsequent encounter: Secondary | ICD-10-CM

## 2017-06-05 DIAGNOSIS — M25531 Pain in right wrist: Secondary | ICD-10-CM

## 2017-06-05 NOTE — Progress Notes (Signed)
PCP: Plotnikov, Evie Lacks, MD  Subjective:   HPI: Patient is a 57 y.o. male here for right wrist injury.  1/4: Patient reports on 12/31 he was trying to separate two dogs who were fighting when he fell backwards sustaining [place] injury to right wrist. Immediate pain, swelling distal right wrist dorsally. Pain level 2/10, sharp. Feels better in splint he's wearing from ED. No other skin changes. No numbness.  1/10: Patient reports he's doing well. Tolerating splint. Pain level 0/10. Not taking anything for pain. No skin changes, numbness.  1/24: Patient reports he feels great. He had a blister from the brace rubbing him between 1st and 2nd digits. Some skin irritation here but no drainage. No pain or other complaints. No numbness.  Past Medical History:  Diagnosis Date  . Anxiety   . CAD (coronary artery disease)   . Cocaine abuse ([place])    none now  . Diabetes mellitus     II, borderline, diet controlled  . GERD (gastroesophageal reflux disease)   . Hyperlipidemia   . Myocardial infarction [facility]) "distant past"   related to cocaine abuse  . Obesity, unspecified   . OSA (obstructive sleep apnea)    cpap -PSG apr30 2009, AHI  119  . Pain, low back   . Tobacco use disorder     Current Outpatient Medications on File Prior to Visit  Medication Sig Dispense Refill  . albuterol (PROVENTIL HFA;VENTOLIN HFA) 108 (90 Base) MCG/ACT inhaler Inhale 2 puffs into the lungs every 6 (six) hours as needed for wheezing or shortness of breath. 1 Inhaler 2  . ALPRAZolam (XANAX) 1 MG tablet TAKE 1/2 TO 1 TABLET BY MOUTH TWICE DAILY AS NEEDED FOR ANXIETY 60 tablet 2  . aspirin EC 81 MG tablet Take 1 tablet (81 mg total) by mouth daily.    [name] atorvastatin (LIPITOR) 20 MG tablet Take 1 tablet (20 mg total) by mouth daily. 90 tablet 3  . budesonide-formoterol (SYMBICORT) 160-4.5 MCG/ACT inhaler Inhale 2 puffs into the lungs 2 (two) times daily. 1 Inhaler 6  . Cholecalciferol (VITAMIN D3)  2000 units capsule Take 1 capsule (2,000 Units total) by mouth daily. 100 capsule 3  . cyclobenzaprine (FLEXERIL) 5 MG tablet Take 1 tablet (5 mg total) by mouth 3 (three) times daily as needed for muscle spasms. 30 tablet 1  . gabapentin (NEURONTIN) 300 MG capsule Take 1-2 capsules (300-600 mg total) by mouth 3 (three) times daily. 180 capsule 1  . isosorbide mononitrate (IMDUR) 30 MG 24 hr tablet Take 1 tablet (30 mg total) by mouth daily. 90 tablet 2  . loratadine (CLARITIN) 10 MG tablet Take 1 tablet (10 mg total) by mouth daily. 100 tablet 3  . losartan (COZAAR) 100 MG tablet Take 1 tablet (100 mg total) by mouth daily. 90 tablet 3  . methylPREDNISolone (MEDROL DOSEPAK) 4 MG TBPK tablet As directed 21 tablet 0  . naproxen (NAPROSYN) 500 MG tablet Take 1 tablet (500 mg total) by mouth daily as needed. 90 tablet 0  . nitroGLYCERIN (NITROSTAT) 0.4 MG SL tablet Place 1 tablet (0.4 mg total) under the tongue every 5 (five) minutes as needed. 20 tablet 3  . oxyCODONE-acetaminophen (PERCOCET) 10-325 MG tablet Take 1 tablet by mouth every 6 (six) hours as needed for pain. 28 tablet 0  . silver sulfADIAZINE (SILVADENE) 1 % cream Apply 1 application topically daily. 50 g 0   No current facility-administered medications on file prior to visit.     Past Surgical  History:  Procedure Laterality Date  . BACK SURGERY     x 2  . FRACTURE SURGERY     R. leg  . FRACTURE SURGERY     L. arm  . LEFT HEART CATHETERIZATION WITH CORONARY ANGIOGRAM N/A 11/22/2013   Procedure: LEFT HEART CATHETERIZATION WITH CORONARY ANGIOGRAM;  Surgeon: Larey Dresser, MD;  [facility] C[facility] LAB;  Service: Cardiovascular;  Laterality: N/A;    Allergies  Allergen Reactions  . Ace Inhibitors   . Lisinopril     cough  . Tramadol     Pt had seizures while on it    Social History   Socioeconomic History  . Marital status: Married    Spouse name: Not on file  . Number of children: 2  . Years of education: Not on file   . Highest education level: Not on file  Social Needs  . Financial resource strain: Not on file  . Food insecurity - worry: Not on file  . Food insecurity - inability: Not on file  . Transportation needs - medical: Not on file  . Transportation needs - non-medical: Not on file  Occupational History  . Occupation: Leisure centre manager  Tobacco Use  . Smoking status: Current Every Day Smoker    Packs/day: 2.00    Years: 25.00    Pack years: 50.00    Types: Cigarettes  . Smokeless tobacco: Never Used  Substance and Sexual Activity  . Alcohol use: Yes    Alcohol/week: 3.6 oz    Types: 6 Standard drinks or equivalent per week    Comment: 6 drinks weekly  . Drug use: Yes    Types: Marijuana    Comment: cocaine abuse yrs ago. none now.  . Sexual activity: Yes  Other Topics Concern  . Not on file  Social History Narrative  . Not on file    Family History  Problem Relation Age of Onset  . Heart attack Father 41       grandfather died MI 16  . Heart disease Father        cad  . Cancer Mother        lung ca  . Heart attack Unknown   . Atrial fibrillation Other     BP 117/77   Pulse (!) 106   Ht 6' (1.829 m)   Wt (!) 320 lb (145.2 kg)   BMI 43.40 kg/m   Review of Systems: See HPI above.     Objective:  Physical Exam:  Gen: NAD, comfortable in exam room.  Right wrist: Brace removed. No swelling, bruising.  Mild skin irritation 1st web space.  No drainage. FROM digits with 5/5 strength.  Wrist motion not tested. No TTP distal radius.  No other tenderness. NVI distally.  Assessment & Plan:  1. Right wrist injury - Independently reviewed today's radiographs and no interval change though clinically has no tenderness now.  Continues EXOS brace.  Tylenol or ibuprofen if needed.  F/u when 6 weeks out for reevaluation and repeat x-rays.

## 2017-06-05 NOTE — Patient Instructions (Signed)
You have a distal radius fracture. Wear the EXOS brace and follow up with me around February 11th when you're 6 weeks out from this injury. Make sure you dry this with a hair dryer if it gets wet. Elevate above your heart when possible. Tylenol, ibuprofen if needed for pain.

## 2017-06-05 NOTE — Assessment & Plan Note (Signed)
Independently reviewed today's radiographs and no interval change though clinically has no tenderness now.  Continues EXOS brace.  Tylenol or ibuprofen if needed.  F/u when 6 weeks out for reevaluation and repeat x-rays.

## 2017-06-19 ENCOUNTER — Other Ambulatory Visit: Payer: Self-pay | Admitting: Internal Medicine

## 2017-06-19 ENCOUNTER — Other Ambulatory Visit: Payer: Self-pay | Admitting: *Deleted

## 2017-06-19 MED ORDER — ISOSORBIDE MONONITRATE ER 30 MG PO TB24: 30.0000 mg | ORAL_TABLET | Freq: Every day | ORAL | 2 refills | Status: DC

## 2017-06-19 NOTE — Telephone Encounter (Signed)
Copied from [place]. Topic: Quick Communication - Rx Refill/Question >> Jun 19, 2017  9:13 AM Lolita Rieger, RMA wrote: Medication: imdur 30 mg   Has the patient contacted their pharmacy? yes   (Agent: If no, request that the patient contact the pharmacy for the refill.)   Preferred Pharmacy (with phone number or street name): [facility] at [address] in [place]: Please be advised that RX refills may take up to 3 business days. We ask that you follow-up with your pharmacy.

## 2017-06-23 ENCOUNTER — Ambulatory Visit: Payer: Self-pay | Admitting: Family Medicine

## 2017-06-30 ENCOUNTER — Ambulatory Visit (INDEPENDENT_AMBULATORY_CARE_PROVIDER_SITE_OTHER): Payer: Self-pay | Admitting: Internal Medicine

## 2017-06-30 ENCOUNTER — Encounter: Payer: Self-pay | Admitting: Internal Medicine

## 2017-06-30 VITALS — BP 124/72 | HR 100 | Temp 98.0°F | Ht 72.0 in | Wt 314.0 lb

## 2017-06-30 DIAGNOSIS — M545 Low back pain: Secondary | ICD-10-CM

## 2017-06-30 DIAGNOSIS — M79604 Pain in right leg: Secondary | ICD-10-CM

## 2017-06-30 DIAGNOSIS — F411 Generalized anxiety disorder: Secondary | ICD-10-CM

## 2017-06-30 DIAGNOSIS — Z23 Encounter for immunization: Secondary | ICD-10-CM

## 2017-06-30 MED ORDER — ALPRAZOLAM 1 MG PO TABS: 0.5000 mg | ORAL_TABLET | Freq: Two times a day (BID) | ORAL | 2 refills | Status: DC | PRN

## 2017-06-30 MED ORDER — ATORVASTATIN CALCIUM 20 MG PO TABS: 20.0000 mg | ORAL_TABLET | Freq: Every day | ORAL | 3 refills | Status: DC

## 2017-06-30 MED ORDER — LOSARTAN POTASSIUM 100 MG PO TABS: 100.0000 mg | ORAL_TABLET | Freq: Every day | ORAL | 3 refills | Status: DC

## 2017-06-30 MED ORDER — ISOSORBIDE MONONITRATE ER 30 MG PO TB24: 30.0000 mg | ORAL_TABLET | Freq: Every day | ORAL | 2 refills | Status: DC

## 2017-06-30 MED ORDER — GABAPENTIN 600 MG PO TABS: 600.0000 mg | ORAL_TABLET | Freq: Three times a day (TID) | ORAL | 3 refills | Status: DC

## 2017-06-30 NOTE — Progress Notes (Signed)
Subjective:  Patient ID: [name], male    DOB: 01-Mar-1961  Age: 57 y.o. MRN: [id number]  CC: No chief complaint on file.   HPI [name] presents for LBP irrad to the L knee - 7/10, anxiety, obesity. He broke wrist 2 mo ago. He had an epidural - it did not work. Lyrica was given - it helped - too $$$.   Outpatient Medications Prior to Visit  Medication Sig Dispense Refill  . albuterol (PROVENTIL HFA;VENTOLIN HFA) 108 (90 Base) MCG/ACT inhaler Inhale 2 puffs into the lungs every 6 (six) hours as needed for wheezing or shortness of breath. 1 Inhaler 2  . ALPRAZolam (XANAX) 1 MG tablet TAKE 1/2 TO 1 TABLET BY MOUTH TWICE DAILY AS NEEDED FOR ANXIETY 60 tablet 2  . aspirin EC 81 MG tablet Take 1 tablet (81 mg total) by mouth daily.    [name] atorvastatin (LIPITOR) 20 MG tablet Take 1 tablet (20 mg total) by mouth daily. 90 tablet 3  . budesonide-formoterol (SYMBICORT) 160-4.5 MCG/ACT inhaler Inhale 2 puffs into the lungs 2 (two) times daily. 1 Inhaler 6  . Cholecalciferol (VITAMIN D3) 2000 units capsule Take 1 capsule (2,000 Units total) by mouth daily. 100 capsule 3  . cyclobenzaprine (FLEXERIL) 5 MG tablet Take 1 tablet (5 mg total) by mouth 3 (three) times daily as needed for muscle spasms. 30 tablet 1  . gabapentin (NEURONTIN) 300 MG capsule Take 1-2 capsules (300-600 mg total) by mouth 3 (three) times daily. 180 capsule 1  . isosorbide mononitrate (IMDUR) 30 MG 24 hr tablet Take 1 tablet (30 mg total) by mouth daily. 90 tablet 2  . loratadine (CLARITIN) 10 MG tablet Take 1 tablet (10 mg total) by mouth daily. 100 tablet 3  . losartan (COZAAR) 100 MG tablet Take 1 tablet (100 mg total) by mouth daily. 90 tablet 3  . methylPREDNISolone (MEDROL DOSEPAK) 4 MG TBPK tablet As directed 21 tablet 0  . naproxen (NAPROSYN) 500 MG tablet Take 1 tablet (500 mg total) by mouth daily as needed. 90 tablet 0  . nitroGLYCERIN (NITROSTAT) 0.4 MG SL tablet Place 1 tablet (0.4 mg  total) under the tongue every 5 (five) minutes as needed. 20 tablet 3  . oxyCODONE-acetaminophen (PERCOCET) 10-325 MG tablet Take 1 tablet by mouth every 6 (six) hours as needed for pain. 28 tablet 0  . silver sulfADIAZINE (SILVADENE) 1 % cream Apply 1 application topically daily. 50 g 0   No facility-administered medications prior to visit.     ROS Review of Systems  Constitutional: Negative for appetite change, fatigue and unexpected weight change.  HENT: Negative for congestion, nosebleeds, sneezing, sore throat and trouble swallowing.   Eyes: Negative for itching and visual disturbance.  Respiratory: Negative for cough.   Cardiovascular: Negative for chest pain, palpitations and leg swelling.  Gastrointestinal: Negative for abdominal distention, blood in stool, diarrhea and nausea.  Genitourinary: Negative for frequency and hematuria.  Musculoskeletal: Positive for back pain. Negative for gait problem, joint swelling and neck pain.  Skin: Negative for rash.  Neurological: Negative for dizziness, tremors, speech difficulty and weakness.  Psychiatric/Behavioral: Negative for agitation, dysphoric mood and sleep disturbance. The patient is not nervous/anxious.     Objective:  BP 124/72 (BP Location: Left Arm, Patient Position: Sitting, Cuff Size: Large)   Pulse 100   Temp 98 F (36.7 C) (Oral)   Ht 6' (1.829 m)   Wt (!) 314 lb (142.4 kg)   SpO2 98%  BMI 42.59 kg/m   BP Readings from Last 3 Encounters:  06/30/17 124/72  06/05/17 117/77  05/22/17 121/85    Wt Readings from Last 3 Encounters:  06/30/17 (!) 314 lb (142.4 kg)  06/05/17 (!) 320 lb (145.2 kg)  05/22/17 (!) 320 lb (145.2 kg)    Physical Exam  Constitutional: He is oriented to person, place, and time. He appears well-developed. No distress.  NAD  HENT:  Mouth/Throat: Oropharynx is clear and moist.  Eyes: Conjunctivae are normal. Pupils are equal, round, and reactive to light.  Neck: Normal range of motion.  No JVD present. No thyromegaly present.  Cardiovascular: Normal rate, regular rhythm, normal heart sounds and intact distal pulses. Exam reveals no gallop and no friction rub.  No murmur heard. Pulmonary/Chest: Effort normal and breath sounds normal. No respiratory distress. He has no wheezes. He has no rales. He exhibits no tenderness.  Abdominal: Soft. Bowel sounds are normal. He exhibits no distension and no mass. There is no tenderness. There is no rebound and no guarding.  Musculoskeletal: Normal range of motion. He exhibits tenderness. He exhibits no edema.  Lymphadenopathy:    He has no cervical adenopathy.  Neurological: He is alert and oriented to person, place, and time. He has normal reflexes. No cranial nerve deficit. He exhibits normal muscle tone. He displays a negative Romberg sign. Gait normal.  Skin: Skin is warm and dry. No rash noted.  Psychiatric: He has a normal mood and affect. His behavior is normal. Judgment and thought content normal.  Obese LS tender  Lab Results  Component Value Date   WBC 9.0 02/21/2017   HGB 15.4 02/21/2017   HCT 45.4 02/21/2017   PLT 150 02/21/2017   GLUCOSE 124 (H) 02/21/2017   CHOL 126 10/18/2013   TRIG 376.0 (H) 10/18/2013   HDL 36.00 (L) 10/18/2013   LDLDIRECT 48.0 05/28/2012   LDLCALC 15 10/18/2013   ALT 35 05/22/2016   ALT 35 05/22/2016   AST 21 05/22/2016   NA 137 02/21/2017   K 4.6 02/21/2017   CL 103 02/21/2017   CREATININE 0.96 02/21/2017   BUN 15 02/21/2017   CO2 26 02/21/2017   TSH 2.08 05/28/2012   PSA 0.33 02/02/2015   INR 0.9 05/22/2016   HGBA1C 5.8 01/18/2015    Dg Wrist Complete Right  Result Date: 06/05/2017 CLINICAL DATA:  Follow-up right wrist fracture.  Assess for healing. EXAM: RIGHT WRIST - COMPLETE 3+ VIEW COMPARISON:  Right wrist series of May 13, 2017 and May 22, 2017. FINDINGS: Again demonstrated is the avulsion fracture from the dorsal aspect of the distal radial metaphysis. It is visible on  the lateral view only. No significant periosteal reaction is observed. Elsewhere no fracture is demonstrated. IMPRESSION: Stable appearance of the avulsion fracture of the dorsum of the distal radius. No significant periosteal reaction is observed. Electronically Signed   By: David  Martinique M.D.   On: 06/05/2017 09:45    Assessment & Plan:   There are no diagnoses linked to this encounter. I am having Mel Almond B. Magdalen Spatz "[employer]" maintain his aspirin EC, Vitamin D3, loratadine, albuterol, budesonide-formoterol, atorvastatin, losartan, nitroGLYCERIN, silver sulfADIAZINE, cyclobenzaprine, gabapentin, oxyCODONE-acetaminophen, methylPREDNISolone, naproxen, ALPRAZolam, and isosorbide mononitrate.  No orders of the defined types were placed in this encounter.    Follow-up: No Follow-up on file.  Walker Kehr, MD

## 2017-06-30 NOTE — Assessment & Plan Note (Signed)
better on diet

## 2017-06-30 NOTE — Assessment & Plan Note (Signed)
Lyrica is too $$$ Will increase Gabapentin Rx

## 2017-06-30 NOTE — Assessment & Plan Note (Signed)
Xanax prn 

## 2017-06-30 NOTE — Addendum Note (Signed)
Addended by: Karren Cobble on: 06/30/2017 08:27 AM   Modules accepted: Orders

## 2017-09-23 ENCOUNTER — Encounter: Payer: Self-pay | Admitting: Internal Medicine

## 2017-09-23 ENCOUNTER — Ambulatory Visit (INDEPENDENT_AMBULATORY_CARE_PROVIDER_SITE_OTHER): Payer: Self-pay | Admitting: Internal Medicine

## 2017-09-23 VITALS — BP 122/76 | HR 88 | Temp 97.8°F | Ht 72.0 in | Wt 310.0 lb

## 2017-09-23 DIAGNOSIS — M5416 Radiculopathy, lumbar region: Secondary | ICD-10-CM

## 2017-09-23 DIAGNOSIS — Z0000 Encounter for general adult medical examination without abnormal findings: Secondary | ICD-10-CM

## 2017-09-23 DIAGNOSIS — J449 Chronic obstructive pulmonary disease, unspecified: Secondary | ICD-10-CM

## 2017-09-23 DIAGNOSIS — G4733 Obstructive sleep apnea (adult) (pediatric): Secondary | ICD-10-CM

## 2017-09-23 DIAGNOSIS — J301 Allergic rhinitis due to pollen: Secondary | ICD-10-CM

## 2017-09-23 MED ORDER — BUDESONIDE-FORMOTEROL FUMARATE 160-4.5 MCG/ACT IN AERO: 2.0000 | INHALATION_SPRAY | Freq: Two times a day (BID) | RESPIRATORY_TRACT | 11 refills | Status: DC

## 2017-09-23 MED ORDER — ALBUTEROL SULFATE HFA 108 (90 BASE) MCG/ACT IN AERS
2.0000 | INHALATION_SPRAY | Freq: Four times a day (QID) | RESPIRATORY_TRACT | 5 refills | Status: DC | PRN
Start: 2017-09-23 — End: 2020-04-20

## 2017-09-23 MED ORDER — ALPRAZOLAM 1 MG PO TABS: 0.5000 mg | ORAL_TABLET | Freq: Two times a day (BID) | ORAL | 2 refills | Status: DC | PRN

## 2017-09-23 NOTE — Progress Notes (Signed)
Subjective:  Patient ID: [name], male    DOB: 1961/03/02  Age: 57 y.o. MRN: [id number]  CC: No chief complaint on file.   HPI [name] presents for COPD, anxiety, LBP f/u  Outpatient Medications Prior to Visit  Medication Sig Dispense Refill  . albuterol (PROVENTIL HFA;VENTOLIN HFA) 108 (90 Base) MCG/ACT inhaler Inhale 2 puffs into the lungs every 6 (six) hours as needed for wheezing or shortness of breath. 1 Inhaler 2  . ALPRAZolam (XANAX) 1 MG tablet Take 0.5-1 tablets (0.5-1 mg total) by mouth 2 (two) times daily as needed. for anxiety 60 tablet 2  . aspirin EC 81 MG tablet Take 1 tablet (81 mg total) by mouth daily.    [name] atorvastatin (LIPITOR) 20 MG tablet Take 1 tablet (20 mg total) by mouth daily. 90 tablet 3  . budesonide-formoterol (SYMBICORT) 160-4.5 MCG/ACT inhaler Inhale 2 puffs into the lungs 2 (two) times daily. 1 Inhaler 6  . Cholecalciferol (VITAMIN D3) 2000 units capsule Take 1 capsule (2,000 Units total) by mouth daily. 100 capsule 3  . gabapentin (NEURONTIN) 600 MG tablet Take 1 tablet (600 mg total) by mouth 3 (three) times daily. 90 tablet 3  . isosorbide mononitrate (IMDUR) 30 MG 24 hr tablet Take 1 tablet (30 mg total) by mouth daily. 90 tablet 2  . losartan (COZAAR) 100 MG tablet Take 1 tablet (100 mg total) by mouth daily. 90 tablet 3  . nitroGLYCERIN (NITROSTAT) 0.4 MG SL tablet Place 1 tablet (0.4 mg total) under the tongue every 5 (five) minutes as needed. 20 tablet 3  . silver sulfADIAZINE (SILVADENE) 1 % cream Apply 1 application topically daily. 50 g 0  . loratadine (CLARITIN) 10 MG tablet Take 1 tablet (10 mg total) by mouth daily. 100 tablet 3   No facility-administered medications prior to visit.     ROS Review of Systems  Constitutional: Negative for appetite change, fatigue and unexpected weight change.  HENT: Negative for congestion, nosebleeds, sneezing, sore throat and trouble swallowing.   Eyes: Negative for  itching and visual disturbance.  Respiratory: Negative for cough.   Cardiovascular: Negative for chest pain, palpitations and leg swelling.  Gastrointestinal: Negative for abdominal distention, blood in stool, diarrhea and nausea.  Genitourinary: Negative for frequency and hematuria.  Musculoskeletal: Positive for back pain and gait problem. Negative for joint swelling and neck pain.  Skin: Negative for rash.  Neurological: Negative for dizziness, tremors, speech difficulty and weakness.  Psychiatric/Behavioral: Negative for agitation, dysphoric mood, sleep disturbance and suicidal ideas. The patient is not nervous/anxious.     Objective:  BP 122/76 (BP Location: Left Arm, Patient Position: Sitting, Cuff Size: Large)   Pulse 88   Temp 97.8 F (36.6 C) (Oral)   Ht 6' (1.829 m)   Wt (!) 310 lb (140.6 kg)   SpO2 97%   BMI 42.04 kg/m   BP Readings from Last 3 Encounters:  09/23/17 122/76  06/30/17 124/72  06/05/17 117/77    Wt Readings from Last 3 Encounters:  09/23/17 (!) 310 lb (140.6 kg)  06/30/17 (!) 314 lb (142.4 kg)  06/05/17 (!) 320 lb (145.2 kg)    Physical Exam  Constitutional: He is oriented to person, place, and time. He appears well-developed. No distress.  NAD  HENT:  Mouth/Throat: Oropharynx is clear and moist.  Eyes: Pupils are equal, round, and reactive to light. Conjunctivae are normal.  Neck: Normal range of motion. No JVD present. No thyromegaly present.  Cardiovascular: Normal  rate, regular rhythm, normal heart sounds and intact distal pulses. Exam reveals no gallop and no friction rub.  No murmur heard. Pulmonary/Chest: Effort normal and breath sounds normal. No respiratory distress. He has no wheezes. He has no rales. He exhibits no tenderness.  Abdominal: Soft. Bowel sounds are normal. He exhibits no distension and no mass. There is no tenderness. There is no rebound and no guarding.  Musculoskeletal: Normal range of motion. He exhibits no edema or  tenderness.  Lymphadenopathy:    He has no cervical adenopathy.  Neurological: He is alert and oriented to person, place, and time. He has normal reflexes. No cranial nerve deficit. He exhibits normal muscle tone. He displays a negative Romberg sign. Coordination and gait normal.  Skin: Skin is warm and dry. No rash noted.  Psychiatric: He has a normal mood and affect. His behavior is normal. Judgment and thought content normal.   Obese  Lab Results  Component Value Date   WBC 9.0 02/21/2017   HGB 15.4 02/21/2017   HCT 45.4 02/21/2017   PLT 150 02/21/2017   GLUCOSE 124 (H) 02/21/2017   CHOL 126 10/18/2013   TRIG 376.0 (H) 10/18/2013   HDL 36.00 (L) 10/18/2013   LDLDIRECT 48.0 05/28/2012   LDLCALC 15 10/18/2013   ALT 35 05/22/2016   ALT 35 05/22/2016   AST 21 05/22/2016   NA 137 02/21/2017   K 4.6 02/21/2017   CL 103 02/21/2017   CREATININE 0.96 02/21/2017   BUN 15 02/21/2017   CO2 26 02/21/2017   TSH 2.08 05/28/2012   PSA 0.33 02/02/2015   INR 0.9 05/22/2016   HGBA1C 5.8 01/18/2015    Dg Wrist Complete Right  Result Date: 06/05/2017 CLINICAL DATA:  Follow-up right wrist fracture.  Assess for healing. EXAM: RIGHT WRIST - COMPLETE 3+ VIEW COMPARISON:  Right wrist series of May 13, 2017 and May 22, 2017. FINDINGS: Again demonstrated is the avulsion fracture from the dorsal aspect of the distal radial metaphysis. It is visible on the lateral view only. No significant periosteal reaction is observed. Elsewhere no fracture is demonstrated. IMPRESSION: Stable appearance of the avulsion fracture of the dorsum of the distal radius. No significant periosteal reaction is observed. Electronically Signed   By: David  Martinique M.D.   On: 06/05/2017 09:45    Assessment & Plan:   There are no diagnoses linked to this encounter. I am having [name] B. Magdalen Spatz "[employer]" maintain his aspirin EC, Vitamin D3, loratadine, albuterol, budesonide-formoterol, nitroGLYCERIN, silver  sulfADIAZINE, gabapentin, losartan, atorvastatin, isosorbide mononitrate, and ALPRAZolam.  No orders of the defined types were placed in this encounter.    Follow-up: No follow-ups on file.  Walker Kehr, MD

## 2017-09-23 NOTE — Assessment & Plan Note (Signed)
On BiPAP.

## 2017-09-23 NOTE — Assessment & Plan Note (Signed)
Meds renewed.

## 2017-09-23 NOTE — Assessment & Plan Note (Signed)
Off Tramadol

## 2017-09-23 NOTE — Assessment & Plan Note (Signed)
Claritin

## 2017-12-29 ENCOUNTER — Ambulatory Visit (INDEPENDENT_AMBULATORY_CARE_PROVIDER_SITE_OTHER): Payer: Self-pay | Admitting: Internal Medicine

## 2017-12-29 ENCOUNTER — Encounter: Payer: Self-pay | Admitting: Internal Medicine

## 2017-12-29 ENCOUNTER — Other Ambulatory Visit (INDEPENDENT_AMBULATORY_CARE_PROVIDER_SITE_OTHER): Payer: Self-pay

## 2017-12-29 DIAGNOSIS — M545 Low back pain, unspecified: Secondary | ICD-10-CM

## 2017-12-29 DIAGNOSIS — F17200 Nicotine dependence, unspecified, uncomplicated: Secondary | ICD-10-CM

## 2017-12-29 DIAGNOSIS — M79605 Pain in left leg: Secondary | ICD-10-CM

## 2017-12-29 DIAGNOSIS — Z0000 Encounter for general adult medical examination without abnormal findings: Secondary | ICD-10-CM

## 2017-12-29 DIAGNOSIS — I252 Old myocardial infarction: Secondary | ICD-10-CM

## 2017-12-29 DIAGNOSIS — I10 Essential (primary) hypertension: Secondary | ICD-10-CM

## 2017-12-29 DIAGNOSIS — I25119 Atherosclerotic heart disease of native coronary artery with unspecified angina pectoris: Secondary | ICD-10-CM

## 2017-12-29 LAB — BASIC METABOLIC PANEL
BUN: 13 mg/dL (ref 6–23)
CALCIUM: 9.5 mg/dL (ref 8.4–10.5)
CO2: 26 mEq/L (ref 19–32)
Chloride: 106 mEq/L (ref 96–112)
Creatinine, Ser: 0.9 mg/dL (ref 0.40–1.50)
GFR: 92.89 mL/min (ref >60.00)
GLUCOSE: 149 mg/dL — AB (ref 70–99)
Potassium: 4.2 mEq/L (ref 3.5–5.1)
SODIUM: 141 meq/L (ref 135–145)

## 2017-12-29 LAB — LIPID PANEL
Cholesterol: 113 mg/dL (ref 0–200)
HDL: 31.8 mg/dL — AB (ref >39.00)
NonHDL: 81.21
TRIGLYCERIDES: 335 mg/dL — AB (ref 0.0–149.0)
Total CHOL/HDL Ratio: 4
VLDL: 67 mg/dL — ABNORMAL HIGH (ref 0.0–40.0)

## 2017-12-29 LAB — CBC WITH DIFFERENTIAL/PLATELET
Basophils Absolute: 0 10*3/uL (ref 0.0–0.1)
Basophils Relative: 0.3 % (ref 0.0–3.0)
EOS PCT: 3.1 % (ref 0.0–5.0)
Eosinophils Absolute: 0.2 10*3/uL (ref 0.0–0.7)
HCT: 44.2 % (ref 39.0–52.0)
Hemoglobin: 15.1 g/dL (ref 13.0–17.0)
LYMPHS ABS: 2 10*3/uL (ref 0.7–4.0)
Lymphocytes Relative: 31 % (ref 12.0–46.0)
MCHC: 34.2 g/dL (ref 30.0–36.0)
MCV: 90.5 fl (ref 78.0–100.0)
MONOS PCT: 8.4 % (ref 3.0–12.0)
Monocytes Absolute: 0.5 10*3/uL (ref 0.1–1.0)
NEUTROS ABS: 3.6 10*3/uL (ref 1.4–7.7)
NEUTROS PCT: 57.2 % (ref 43.0–77.0)
PLATELETS: 160 10*3/uL (ref 150.0–400.0)
RBC: 4.89 Mil/uL (ref 4.22–5.81)
RDW: 13.7 % (ref 11.5–15.5)
WBC: 6.4 10*3/uL (ref 4.0–10.5)

## 2017-12-29 LAB — URINALYSIS, ROUTINE W REFLEX MICROSCOPIC
HGB URINE DIPSTICK: NEGATIVE
Ketones, ur: NEGATIVE
Nitrite: NEGATIVE
RBC / HPF: NONE SEEN (ref 0–?)
Specific Gravity, Urine: >=1.03 — AB (ref 1.000–1.030)
Total Protein, Urine: NEGATIVE
Urine Glucose: NEGATIVE
Urobilinogen, UA: 0.2 (ref 0.0–1.0)
pH: 5.5 (ref 5.0–8.0)

## 2017-12-29 LAB — HEPATIC FUNCTION PANEL
ALBUMIN: 4.3 g/dL (ref 3.5–5.2)
ALK PHOS: 60 U/L (ref 39–117)
ALT: 37 U/L (ref 0–53)
AST: 18 U/L (ref 0–37)
Bilirubin, Direct: 0.1 mg/dL (ref 0.0–0.3)
Total Bilirubin: 0.9 mg/dL (ref 0.2–1.2)
Total Protein: 6.3 g/dL (ref 6.0–8.3)

## 2017-12-29 LAB — PSA: PSA: 0.32 ng/mL (ref 0.10–4.00)

## 2017-12-29 LAB — LDL CHOLESTEROL, DIRECT: LDL DIRECT: 38 mg/dL

## 2017-12-29 LAB — TSH: TSH: 1.88 u[IU]/mL (ref 0.35–4.50)

## 2017-12-29 MED ORDER — LOSARTAN POTASSIUM 100 MG PO TABS: 100.0000 mg | ORAL_TABLET | Freq: Every day | ORAL | 3 refills | Status: DC

## 2017-12-29 MED ORDER — ATORVASTATIN CALCIUM 20 MG PO TABS: 20.0000 mg | ORAL_TABLET | Freq: Every day | ORAL | 3 refills | Status: DC

## 2017-12-29 MED ORDER — ISOSORBIDE MONONITRATE ER 30 MG PO TB24: 30.0000 mg | ORAL_TABLET | Freq: Every day | ORAL | 3 refills | Status: DC

## 2017-12-29 MED ORDER — DICLOFENAC SODIUM 75 MG PO TBEC: 75.0000 mg | DELAYED_RELEASE_TABLET | Freq: Two times a day (BID) | ORAL | 5 refills | Status: DC | PRN

## 2017-12-29 MED ORDER — NITROGLYCERIN 0.4 MG SL SUBL: 0.4000 mg | SUBLINGUAL_TABLET | SUBLINGUAL | 3 refills | Status: DC | PRN

## 2017-12-29 MED ORDER — GABAPENTIN 600 MG PO TABS: 600.0000 mg | ORAL_TABLET | Freq: Three times a day (TID) | ORAL | 3 refills | Status: DC

## 2017-12-29 MED ORDER — ALPRAZOLAM 1 MG PO TABS: 0.5000 mg | ORAL_TABLET | Freq: Two times a day (BID) | ORAL | 2 refills | Status: DC | PRN

## 2017-12-29 NOTE — Assessment & Plan Note (Signed)
2 ppd discussed risks of MI, cancer

## 2017-12-29 NOTE — Assessment & Plan Note (Signed)
Diclofenac

## 2017-12-29 NOTE — Assessment & Plan Note (Signed)
NTG prn °No CP °

## 2017-12-29 NOTE — Assessment & Plan Note (Signed)
Losartan 

## 2017-12-29 NOTE — Assessment & Plan Note (Addendum)
2 ppd discussed risks of MI, cancer

## 2017-12-29 NOTE — Assessment & Plan Note (Signed)
Wt Readings from Last 3 Encounters:  12/29/17 (!) 310 lb (140.6 kg)  09/23/17 (!) 310 lb (140.6 kg)  06/30/17 (!) 314 lb (142.4 kg)

## 2017-12-29 NOTE — Assessment & Plan Note (Signed)
2 ppd discussed risks of MI

## 2017-12-29 NOTE — Progress Notes (Signed)
Subjective:  Patient ID: [name], male    DOB: 03-26-1961  Age: 57 y.o. MRN: [id number]  CC: No chief complaint on file.   HPI Markees Carns presents for OA C/o LBP - back doctor was Rx'ing Diclofenac  Outpatient Medications Prior to Visit  Medication Sig Dispense Refill  . albuterol (PROVENTIL HFA;VENTOLIN HFA) 108 (90 Base) MCG/ACT inhaler Inhale 2 puffs into the lungs every 6 (six) hours as needed for wheezing or shortness of breath. 1 Inhaler 5  . ALPRAZolam (XANAX) 1 MG tablet Take 0.5-1 tablets (0.5-1 mg total) by mouth 2 (two) times daily as needed. for anxiety 60 tablet 2  . aspirin EC 81 MG tablet Take 1 tablet (81 mg total) by mouth daily.    [name] atorvastatin (LIPITOR) 20 MG tablet Take 1 tablet (20 mg total) by mouth daily. 90 tablet 3  . budesonide-formoterol (SYMBICORT) 160-4.5 MCG/ACT inhaler Inhale 2 puffs into the lungs 2 (two) times daily. 1 Inhaler 11  . Cholecalciferol (VITAMIN D3) 2000 units capsule Take 1 capsule (2,000 Units total) by mouth daily. 100 capsule 3  . gabapentin (NEURONTIN) 600 MG tablet Take 1 tablet (600 mg total) by mouth 3 (three) times daily. 90 tablet 3  . isosorbide mononitrate (IMDUR) 30 MG 24 hr tablet Take 1 tablet (30 mg total) by mouth daily. 90 tablet 2  . losartan (COZAAR) 100 MG tablet Take 1 tablet (100 mg total) by mouth daily. 90 tablet 3  . nitroGLYCERIN (NITROSTAT) 0.4 MG SL tablet Place 1 tablet (0.4 mg total) under the tongue every 5 (five) minutes as needed. 20 tablet 3  . loratadine (CLARITIN) 10 MG tablet Take 1 tablet (10 mg total) by mouth daily. 100 tablet 3   No facility-administered medications prior to visit.     ROS: Review of Systems  Constitutional: Negative for appetite change, fatigue and unexpected weight change.  HENT: Negative for congestion, nosebleeds, sneezing, sore throat and trouble swallowing.   Eyes: Negative for itching and visual disturbance.  Respiratory: Negative for  cough.   Cardiovascular: Negative for chest pain, palpitations and leg swelling.  Gastrointestinal: Negative for abdominal distention, blood in stool, diarrhea and nausea.  Genitourinary: Negative for frequency and hematuria.  Musculoskeletal: Positive for arthralgias and back pain. Negative for gait problem, joint swelling and neck pain.  Skin: Negative for rash.  Neurological: Negative for dizziness, tremors, speech difficulty and weakness.  Psychiatric/Behavioral: Negative for agitation, dysphoric mood, sleep disturbance and suicidal ideas. The patient is not nervous/anxious.     Objective:  BP 126/74 (BP Location: Left Arm, Patient Position: Sitting, Cuff Size: Large)   Pulse 91   Temp 97.8 F (36.6 C) (Oral)   Ht 6' (1.829 m)   Wt (!) 310 lb (140.6 kg)   SpO2 92%   BMI 42.04 kg/m   BP Readings from Last 3 Encounters:  12/29/17 126/74  09/23/17 122/76  06/30/17 124/72    Wt Readings from Last 3 Encounters:  12/29/17 (!) 310 lb (140.6 kg)  09/23/17 (!) 310 lb (140.6 kg)  06/30/17 (!) 314 lb (142.4 kg)    Physical Exam  Constitutional: He is oriented to person, place, and time. He appears well-developed. No distress.  NAD  HENT:  Mouth/Throat: Oropharynx is clear and moist.  Eyes: Pupils are equal, round, and reactive to light. Conjunctivae are normal.  Neck: Normal range of motion. No JVD present. No thyromegaly present.  Cardiovascular: Normal rate, regular rhythm, normal heart sounds and intact distal pulses.  Exam reveals no gallop and no friction rub.  No murmur heard. Pulmonary/Chest: Effort normal and breath sounds normal. No respiratory distress. He has no wheezes. He has no rales. He exhibits no tenderness.  Abdominal: Soft. Bowel sounds are normal. He exhibits no distension and no mass. There is no tenderness. There is no rebound and no guarding.  Musculoskeletal: Normal range of motion. He exhibits tenderness. He exhibits no edema.  Lymphadenopathy:    He has  no cervical adenopathy.  Neurological: He is alert and oriented to person, place, and time. He has normal reflexes. No cranial nerve deficit. He exhibits normal muscle tone. He displays a negative Romberg sign. Coordination and gait normal.  Skin: Skin is warm and dry. No rash noted.  Psychiatric: He has a normal mood and affect. His behavior is normal. Judgment and thought content normal.  obese LS tender  Lab Results  Component Value Date   WBC 9.0 02/21/2017   HGB 15.4 02/21/2017   HCT 45.4 02/21/2017   PLT 150 02/21/2017   GLUCOSE 124 (H) 02/21/2017   CHOL 126 10/18/2013   TRIG 376.0 (H) 10/18/2013   HDL 36.00 (L) 10/18/2013   LDLDIRECT 48.0 05/28/2012   LDLCALC 15 10/18/2013   ALT 35 05/22/2016   ALT 35 05/22/2016   AST 21 05/22/2016   NA 137 02/21/2017   K 4.6 02/21/2017   CL 103 02/21/2017   CREATININE 0.96 02/21/2017   BUN 15 02/21/2017   CO2 26 02/21/2017   TSH 2.08 05/28/2012   PSA 0.33 02/02/2015   INR 0.9 05/22/2016   HGBA1C 5.8 01/18/2015    Dg Wrist Complete Right  Result Date: 06/05/2017 CLINICAL DATA:  Follow-up right wrist fracture.  Assess for healing. EXAM: RIGHT WRIST - COMPLETE 3+ VIEW COMPARISON:  Right wrist series of May 13, 2017 and May 22, 2017. FINDINGS: Again demonstrated is the avulsion fracture from the dorsal aspect of the distal radial metaphysis. It is visible on the lateral view only. No significant periosteal reaction is observed. Elsewhere no fracture is demonstrated. IMPRESSION: Stable appearance of the avulsion fracture of the dorsum of the distal radius. No significant periosteal reaction is observed. Electronically Signed   By: David  Martinique M.D.   On: 06/05/2017 09:45    Assessment & Plan:   There are no diagnoses linked to this encounter.   No orders of the defined types were placed in this encounter.    Follow-up: No follow-ups on file.  Walker Kehr, MD

## 2018-03-12 ENCOUNTER — Ambulatory Visit: Payer: Self-pay | Admitting: Internal Medicine

## 2018-03-12 ENCOUNTER — Encounter: Payer: Self-pay | Admitting: Internal Medicine

## 2018-03-12 VITALS — BP 124/66 | HR 89 | Temp 97.7°F | Ht 72.0 in | Wt 309.0 lb

## 2018-03-12 DIAGNOSIS — J449 Chronic obstructive pulmonary disease, unspecified: Secondary | ICD-10-CM

## 2018-03-12 DIAGNOSIS — J0101 Acute recurrent maxillary sinusitis: Secondary | ICD-10-CM

## 2018-03-12 DIAGNOSIS — F411 Generalized anxiety disorder: Secondary | ICD-10-CM

## 2018-03-12 DIAGNOSIS — H6122 Impacted cerumen, left ear: Secondary | ICD-10-CM

## 2018-03-12 DIAGNOSIS — Z23 Encounter for immunization: Secondary | ICD-10-CM

## 2018-03-12 MED ORDER — HYDROCOD POLST-CPM POLST ER 10-8 MG/5ML PO SUER: 5.0000 mL | Freq: Two times a day (BID) | ORAL | 0 refills | Status: DC | PRN

## 2018-03-12 MED ORDER — CEFDINIR 300 MG PO CAPS: 300.0000 mg | ORAL_CAPSULE | Freq: Two times a day (BID) | ORAL | 0 refills | Status: DC

## 2018-03-12 MED ORDER — NEOMYCIN-POLYMYXIN-HC 3.5-10000-1 OT SOLN: 3.0000 [drp] | Freq: Four times a day (QID) | OTIC | 3 refills | Status: DC

## 2018-03-12 MED ORDER — ALPRAZOLAM 1 MG PO TABS: 0.5000 mg | ORAL_TABLET | Freq: Two times a day (BID) | ORAL | 2 refills | Status: DC | PRN

## 2018-03-12 NOTE — Assessment & Plan Note (Signed)
URI w/cough Tussionex prn

## 2018-03-12 NOTE — Progress Notes (Signed)
Subjective:  Patient ID: [name], male    DOB: 1961/03/22  Age: 57 y.o. MRN: [id number]  CC: No chief complaint on file.   HPI Lemoyne Nestor presents for URI sx's x 1 mo: white mucus, L ear pain, L face pain F/u OA/LBP, anxiety  Outpatient Medications Prior to Visit  Medication Sig Dispense Refill  . albuterol (PROVENTIL HFA;VENTOLIN HFA) 108 (90 Base) MCG/ACT inhaler Inhale 2 puffs into the lungs every 6 (six) hours as needed for wheezing or shortness of breath. 1 Inhaler 5  . ALPRAZolam (XANAX) 1 MG tablet Take 0.5-1 tablets (0.5-1 mg total) by mouth 2 (two) times daily as needed. for anxiety 60 tablet 2  . aspirin EC 81 MG tablet Take 1 tablet (81 mg total) by mouth daily.    [name] atorvastatin (LIPITOR) 20 MG tablet Take 1 tablet (20 mg total) by mouth daily. 90 tablet 3  . budesonide-formoterol (SYMBICORT) 160-4.5 MCG/ACT inhaler Inhale 2 puffs into the lungs 2 (two) times daily. 1 Inhaler 11  . Cholecalciferol (VITAMIN D3) 2000 units capsule Take 1 capsule (2,000 Units total) by mouth daily. 100 capsule 3  . diclofenac (VOLTAREN) 75 MG EC tablet Take 1 tablet (75 mg total) by mouth 2 (two) times daily as needed for moderate pain (pc). 60 tablet 5  . gabapentin (NEURONTIN) 600 MG tablet Take 1 tablet (600 mg total) by mouth 3 (three) times daily. 270 tablet 3  . isosorbide mononitrate (IMDUR) 30 MG 24 hr tablet Take 1 tablet (30 mg total) by mouth daily. 90 tablet 3  . losartan (COZAAR) 100 MG tablet Take 1 tablet (100 mg total) by mouth daily. 90 tablet 3  . nitroGLYCERIN (NITROSTAT) 0.4 MG SL tablet Place 1 tablet (0.4 mg total) under the tongue every 5 (five) minutes as needed. 20 tablet 3  . loratadine (CLARITIN) 10 MG tablet Take 1 tablet (10 mg total) by mouth daily. 100 tablet 3   No facility-administered medications prior to visit.     ROS: Review of Systems  Constitutional: Negative for appetite change, fatigue and unexpected weight change.    HENT: Positive for congestion, postnasal drip, rhinorrhea and sinus pressure. Negative for nosebleeds, sneezing, sore throat and trouble swallowing.   Eyes: Negative for itching and visual disturbance.  Respiratory: Negative for cough and shortness of breath.   Cardiovascular: Negative for chest pain, palpitations and leg swelling.  Gastrointestinal: Negative for abdominal distention, blood in stool, diarrhea and nausea.  Genitourinary: Negative for frequency and hematuria.  Musculoskeletal: Positive for arthralgias and back pain. Negative for gait problem, joint swelling and neck pain.  Skin: Negative for rash.  Neurological: Negative for dizziness, tremors, speech difficulty and weakness.  Psychiatric/Behavioral: Negative for agitation, dysphoric mood and sleep disturbance. The patient is nervous/anxious.     Objective:  BP 124/66 (BP Location: Left Arm, Patient Position: Sitting, Cuff Size: Large)   Pulse 89   Temp 97.7 F (36.5 C) (Oral)   Ht 6' (1.829 m)   Wt (!) 309 lb (140.2 kg)   SpO2 97%   BMI 41.91 kg/m   BP Readings from Last 3 Encounters:  03/12/18 124/66  12/29/17 126/74  09/23/17 122/76    Wt Readings from Last 3 Encounters:  03/12/18 (!) 309 lb (140.2 kg)  12/29/17 (!) 310 lb (140.6 kg)  09/23/17 (!) 310 lb (140.6 kg)    Physical Exam  Constitutional: He is oriented to person, place, and time. He appears well-developed. No distress.  NAD  HENT:  Mouth/Throat: Oropharynx is clear and moist.  Eyes: Pupils are equal, round, and reactive to light. Conjunctivae are normal.  Neck: Normal range of motion. No JVD present. No thyromegaly present.  Cardiovascular: Normal rate, regular rhythm, normal heart sounds and intact distal pulses. Exam reveals no gallop and no friction rub.  No murmur heard. Pulmonary/Chest: Effort normal and breath sounds normal. No respiratory distress. He has no wheezes. He has no rales. He exhibits no tenderness.  Abdominal: Soft. Bowel  sounds are normal. He exhibits no distension and no mass. There is no tenderness. There is no rebound and no guarding.  Musculoskeletal: Normal range of motion. He exhibits no edema or tenderness.  Lymphadenopathy:    He has no cervical adenopathy.  Neurological: He is alert and oriented to person, place, and time. He has normal reflexes. No cranial nerve deficit. He exhibits normal muscle tone. He displays a negative Romberg sign. Coordination and gait normal.  Skin: Skin is warm and dry. No rash noted.  Psychiatric: He has a normal mood and affect. His behavior is normal. Judgment and thought content normal.  obese  Wax L ear   Procedure Note :     Procedure :  Ear irrigation   Indication:  Cerumen impaction L   Risks, including pain, dizziness, eardrum perforation, bleeding, infection and others as well as benefits were explained to the patient in detail. Verbal consent was obtained and the patient agreed to proceed.    We used "The Elephant Ear Irrigation Device" filled with lukewarm water for irrigation. A large amount wax was recovered. Procedure has also required manual wax removal with an ear wax curette and ear forceps. OE on exam post-irrigation   Tolerated well. Complications: None.   Postprocedure instructions :  Call if problems.    Lab Results  Component Value Date   WBC 6.4 12/29/2017   HGB 15.1 12/29/2017   HCT 44.2 12/29/2017   PLT 160.0 12/29/2017   GLUCOSE 149 (H) 12/29/2017   CHOL 113 12/29/2017   TRIG 335.0 (H) 12/29/2017   HDL 31.80 (L) 12/29/2017   LDLDIRECT 38.0 12/29/2017   LDLCALC 15 10/18/2013   ALT 37 12/29/2017   AST 18 12/29/2017   NA 141 12/29/2017   K 4.2 12/29/2017   CL 106 12/29/2017   CREATININE 0.90 12/29/2017   BUN 13 12/29/2017   CO2 26 12/29/2017   TSH 1.88 12/29/2017   PSA 0.32 12/29/2017   INR 0.9 05/22/2016   HGBA1C 5.8 01/18/2015    Dg Wrist Complete Right  Result Date: 06/05/2017 CLINICAL DATA:  Follow-up right wrist  fracture.  Assess for healing. EXAM: RIGHT WRIST - COMPLETE 3+ VIEW COMPARISON:  Right wrist series of May 13, 2017 and May 22, 2017. FINDINGS: Again demonstrated is the avulsion fracture from the dorsal aspect of the distal radial metaphysis. It is visible on the lateral view only. No significant periosteal reaction is observed. Elsewhere no fracture is demonstrated. IMPRESSION: Stable appearance of the avulsion fracture of the dorsum of the distal radius. No significant periosteal reaction is observed. Electronically Signed   By: David  Martinique M.D.   On: 06/05/2017 09:45    Assessment & Plan:   There are no diagnoses linked to this encounter.   No orders of the defined types were placed in this encounter.    Follow-up: No follow-ups on file.  Walker Kehr, MD

## 2018-03-12 NOTE — Assessment & Plan Note (Signed)
See procedure OE on exam: cortisporin gtt Rx

## 2018-03-12 NOTE — Addendum Note (Signed)
Addended by: Karren Cobble on: 03/12/2018 01:06 PM   Modules accepted: Orders

## 2018-03-12 NOTE — Assessment & Plan Note (Signed)
Cefdinir po 

## 2018-03-12 NOTE — Assessment & Plan Note (Signed)
Alprazolam prn  Potential benefits of a long term benzodiazepines  use as well as potential risks  and complications were explained to the patient and were aknowledged. 

## 2018-04-02 ENCOUNTER — Ambulatory Visit: Payer: Self-pay | Admitting: Internal Medicine

## 2018-06-11 ENCOUNTER — Ambulatory Visit (INDEPENDENT_AMBULATORY_CARE_PROVIDER_SITE_OTHER): Payer: Self-pay | Admitting: Internal Medicine

## 2018-06-11 ENCOUNTER — Encounter: Payer: Self-pay | Admitting: Internal Medicine

## 2018-06-11 VITALS — BP 126/78 | HR 90 | Temp 98.0°F | Ht 72.0 in | Wt 310.0 lb

## 2018-06-11 DIAGNOSIS — M79605 Pain in left leg: Secondary | ICD-10-CM

## 2018-06-11 DIAGNOSIS — M545 Low back pain: Secondary | ICD-10-CM

## 2018-06-11 DIAGNOSIS — I10 Essential (primary) hypertension: Secondary | ICD-10-CM

## 2018-06-11 DIAGNOSIS — Z23 Encounter for immunization: Secondary | ICD-10-CM

## 2018-06-11 DIAGNOSIS — E785 Hyperlipidemia, unspecified: Secondary | ICD-10-CM

## 2018-06-11 DIAGNOSIS — F411 Generalized anxiety disorder: Secondary | ICD-10-CM

## 2018-06-11 MED ORDER — DICLOFENAC SODIUM 75 MG PO TBEC: 75.0000 mg | DELAYED_RELEASE_TABLET | Freq: Two times a day (BID) | ORAL | 5 refills | Status: DC | PRN

## 2018-06-11 MED ORDER — ALPRAZOLAM 1 MG PO TABS: 0.5000 mg | ORAL_TABLET | Freq: Two times a day (BID) | ORAL | 2 refills | Status: DC | PRN

## 2018-06-11 MED ORDER — ISOSORBIDE MONONITRATE ER 30 MG PO TB24: 30.0000 mg | ORAL_TABLET | Freq: Every day | ORAL | 3 refills | Status: DC

## 2018-06-11 NOTE — Assessment & Plan Note (Signed)
Alprazolam prn  Potential benefits of a long term benzodiazepines  use as well as potential risks  and complications were explained to the patient and were aknowledged. 

## 2018-06-11 NOTE — Assessment & Plan Note (Signed)
Lipitor 

## 2018-06-11 NOTE — Assessment & Plan Note (Signed)
Diclofenac  Potential benefits of a long term NSAIDs use as well as potential risks  and complications were explained to the patient and were aknowledged.

## 2018-06-11 NOTE — Progress Notes (Signed)
Subjective:  Patient ID: [name], male    DOB: [birth year]  Age: 58 y.o. MRN: [id number]  CC: No chief complaint on file.   HPI Torres Hardenbrook presents for CAD, COPD, LBP, obesity f/u Not taking Lipitor due to cost  Outpatient Medications Prior to Visit  Medication Sig Dispense Refill  . albuterol (PROVENTIL HFA;VENTOLIN HFA) 108 (90 Base) MCG/ACT inhaler Inhale 2 puffs into the lungs every 6 (six) hours as needed for wheezing or shortness of breath. 1 Inhaler 5  . ALPRAZolam (XANAX) 1 MG tablet Take 0.5-1 tablets (0.5-1 mg total) by mouth 2 (two) times daily as needed. for anxiety 60 tablet 2  . aspirin EC 81 MG tablet Take 1 tablet (81 mg total) by mouth daily.    [name] atorvastatin (LIPITOR) 20 MG tablet Take 1 tablet (20 mg total) by mouth daily. 90 tablet 3  . budesonide-formoterol (SYMBICORT) 160-4.5 MCG/ACT inhaler Inhale 2 puffs into the lungs 2 (two) times daily. 1 Inhaler 11  . Cholecalciferol (VITAMIN D3) 2000 units capsule Take 1 capsule (2,000 Units total) by mouth daily. 100 capsule 3  . diclofenac (VOLTAREN) 75 MG EC tablet Take 1 tablet (75 mg total) by mouth 2 (two) times daily as needed for moderate pain (pc). 60 tablet 5  . gabapentin (NEURONTIN) 600 MG tablet Take 1 tablet (600 mg total) by mouth 3 (three) times daily. 270 tablet 3  . isosorbide mononitrate (IMDUR) 30 MG 24 hr tablet Take 1 tablet (30 mg total) by mouth daily. 90 tablet 3  . losartan (COZAAR) 100 MG tablet Take 1 tablet (100 mg total) by mouth daily. 90 tablet 3  . nitroGLYCERIN (NITROSTAT) 0.4 MG SL tablet Place 1 tablet (0.4 mg total) under the tongue every 5 (five) minutes as needed. 20 tablet 3  . neomycin-polymyxin-hydrocortisone (CORTISPORIN) OTIC solution Place 3 drops into the left ear 4 (four) times daily. 10 mL 3  . loratadine (CLARITIN) 10 MG tablet Take 1 tablet (10 mg total) by mouth daily. 100 tablet 3  . cefdinir (OMNICEF) 300 MG capsule Take 1 capsule (300 mg  total) by mouth 2 (two) times daily. (Patient not taking: Reported on 06/11/2018) 20 capsule 0  . chlorpheniramine-HYDROcodone (TUSSIONEX PENNKINETIC ER) 10-8 MG/5ML SUER Take 5 mLs by mouth every 12 (twelve) hours as needed for cough. (Patient not taking: Reported on 06/11/2018) 115 mL 0   No facility-administered medications prior to visit.     ROS: Review of Systems  Constitutional: Negative for appetite change, fatigue and unexpected weight change.  HENT: Negative for congestion, nosebleeds, sneezing, sore throat and trouble swallowing.   Eyes: Negative for itching and visual disturbance.  Respiratory: Negative for cough.   Cardiovascular: Negative for chest pain, palpitations and leg swelling.  Gastrointestinal: Negative for abdominal distention, blood in stool, diarrhea and nausea.  Genitourinary: Negative for frequency and hematuria.  Musculoskeletal: Positive for arthralgias and back pain. Negative for gait problem, joint swelling and neck pain.  Skin: Negative for rash.  Neurological: Negative for dizziness, tremors, speech difficulty and weakness.  Psychiatric/Behavioral: Positive for sleep disturbance. Negative for agitation, dysphoric mood and suicidal ideas. The patient is nervous/anxious.     Objective:  BP 126/78 (BP Location: Left Arm, Patient Position: Sitting, Cuff Size: Large)   Pulse 90   Temp 98 F (36.7 C) (Oral)   Ht 6' (1.829 m)   Wt (!) 310 lb (140.6 kg)   SpO2 96%   BMI 42.04 kg/m   BP Readings from  Last 3 Encounters:  06/11/18 126/78  03/12/18 124/66  12/29/17 126/74    Wt Readings from Last 3 Encounters:  06/11/18 (!) 310 lb (140.6 kg)  03/12/18 (!) 309 lb (140.2 kg)  12/29/17 (!) 310 lb (140.6 kg)    Physical Exam Constitutional:      General: He is not in acute distress.    Appearance: He is well-developed.     Comments: NAD  Eyes:     Conjunctiva/sclera: Conjunctivae normal.     Pupils: Pupils are equal, round, and reactive to light.    Neck:     Musculoskeletal: Normal range of motion.     Thyroid: No thyromegaly.     Vascular: No JVD.  Cardiovascular:     Rate and Rhythm: Normal rate and regular rhythm.     Heart sounds: Normal heart sounds. No murmur. No friction rub. No gallop.   Pulmonary:     Effort: Pulmonary effort is normal. No respiratory distress.     Breath sounds: Normal breath sounds. No wheezing or rales.  Chest:     Chest wall: No tenderness.  Abdominal:     General: Bowel sounds are normal. There is no distension.     Palpations: Abdomen is soft. There is no mass.     Tenderness: There is no abdominal tenderness. There is no guarding or rebound.  Musculoskeletal: Normal range of motion.        General: No tenderness.  Lymphadenopathy:     Cervical: No cervical adenopathy.  Skin:    General: Skin is warm and dry.     Findings: No rash.  Neurological:     Mental Status: He is alert and oriented to person, place, and time.     Cranial Nerves: No cranial nerve deficit.     Motor: No abnormal muscle tone.     Coordination: Coordination normal.     Gait: Gait normal.     Deep Tendon Reflexes: Reflexes are normal and symmetric.  Psychiatric:        Behavior: Behavior normal.        Thought Content: Thought content normal.        Judgment: Judgment normal.     Lab Results  Component Value Date   WBC 6.4 12/29/2017   HGB 15.1 12/29/2017   HCT 44.2 12/29/2017   PLT 160.0 12/29/2017   GLUCOSE 149 (H) 12/29/2017   CHOL 113 12/29/2017   TRIG 335.0 (H) 12/29/2017   HDL 31.80 (L) 12/29/2017   LDLDIRECT 38.0 12/29/2017   LDLCALC 15 10/18/2013   ALT 37 12/29/2017   AST 18 12/29/2017   NA 141 12/29/2017   K 4.2 12/29/2017   CL 106 12/29/2017   CREATININE 0.90 12/29/2017   BUN 13 12/29/2017   CO2 26 12/29/2017   TSH 1.88 12/29/2017   PSA 0.32 12/29/2017   INR 0.9 05/22/2016   HGBA1C 5.8 01/18/2015    Dg Wrist Complete Right  Result Date: 06/05/2017 CLINICAL DATA:  Follow-up right  wrist fracture.  Assess for healing. EXAM: RIGHT WRIST - COMPLETE 3+ VIEW COMPARISON:  Right wrist series of May 13, 2017 and May 22, 2017. FINDINGS: Again demonstrated is the avulsion fracture from the dorsal aspect of the distal radial metaphysis. It is visible on the lateral view only. No significant periosteal reaction is observed. Elsewhere no fracture is demonstrated. IMPRESSION: Stable appearance of the avulsion fracture of the dorsum of the distal radius. No significant periosteal reaction is observed. Electronically Signed   By:  David  Martinique M.D.   On: 06/05/2017 09:45    Assessment & Plan:   There are no diagnoses linked to this encounter.   No orders of the defined types were placed in this encounter.    Follow-up: No follow-ups on file.  Walker Kehr, MD

## 2018-06-11 NOTE — Assessment & Plan Note (Signed)
Losartan 

## 2018-09-09 ENCOUNTER — Ambulatory Visit (INDEPENDENT_AMBULATORY_CARE_PROVIDER_SITE_OTHER): Payer: Self-pay | Admitting: Internal Medicine

## 2018-09-09 ENCOUNTER — Other Ambulatory Visit: Payer: Self-pay

## 2018-09-09 DIAGNOSIS — F419 Anxiety disorder, unspecified: Secondary | ICD-10-CM

## 2018-09-09 MED ORDER — ALPRAZOLAM 1 MG PO TABS: 0.5000 mg | ORAL_TABLET | Freq: Two times a day (BID) | ORAL | 2 refills | Status: DC | PRN

## 2018-09-09 NOTE — Progress Notes (Signed)
Cumulative time during 7-day interval 10 min, there was not an associated office visit for this concern within a 7 day period. Verbal consent for services obtained from patient prior to services given. Names of all persons present for services: Walker Kehr, MD, Chief complaint: f/u anxiety History, background, results pertinent:  Past Medical History:  Diagnosis Date  . Anxiety   . CAD (coronary artery disease)   . Cocaine abuse ([place])    none now  . Diabetes mellitus     II, borderline, diet controlled  . GERD (gastroesophageal reflux disease)   . Hyperlipidemia   . Myocardial infarction [facility]) "distant past"   related to cocaine abuse  . Obesity, unspecified   . OSA (obstructive sleep apnea)    cpap -PSG apr30 2009, AHI  119  . Pain, low back   . Tobacco use disorder    No results found for this or any previous visit (from the past 48 hour(s)). A/P/next steps: Anxiety. Xanax renewed

## 2018-09-09 NOTE — Assessment & Plan Note (Signed)
Alprazolam prn  Potential benefits of a long term benzodiazepines  use as well as potential risks  and complications were explained to the patient and were aknowledged. 

## 2018-12-08 ENCOUNTER — Encounter: Payer: Self-pay | Admitting: Internal Medicine

## 2018-12-08 ENCOUNTER — Ambulatory Visit (INDEPENDENT_AMBULATORY_CARE_PROVIDER_SITE_OTHER): Payer: Self-pay | Admitting: Internal Medicine

## 2018-12-08 DIAGNOSIS — M545 Low back pain, unspecified: Secondary | ICD-10-CM

## 2018-12-08 DIAGNOSIS — M79605 Pain in left leg: Secondary | ICD-10-CM

## 2018-12-08 DIAGNOSIS — F419 Anxiety disorder, unspecified: Secondary | ICD-10-CM

## 2018-12-08 DIAGNOSIS — Z91199 Patient's noncompliance with other medical treatment and regimen due to unspecified reason: Secondary | ICD-10-CM

## 2018-12-08 DIAGNOSIS — Z9119 Patient's noncompliance with other medical treatment and regimen: Secondary | ICD-10-CM

## 2018-12-08 DIAGNOSIS — E785 Hyperlipidemia, unspecified: Secondary | ICD-10-CM

## 2018-12-08 MED ORDER — ISOSORBIDE MONONITRATE ER 30 MG PO TB24: 30.0000 mg | ORAL_TABLET | Freq: Every day | ORAL | 3 refills | Status: DC

## 2018-12-08 MED ORDER — DICLOFENAC SODIUM 75 MG PO TBEC: 75.0000 mg | DELAYED_RELEASE_TABLET | Freq: Two times a day (BID) | ORAL | 5 refills | Status: DC | PRN

## 2018-12-08 MED ORDER — ALPRAZOLAM 1 MG PO TABS: 0.5000 mg | ORAL_TABLET | Freq: Two times a day (BID) | ORAL | 2 refills | Status: DC | PRN

## 2018-12-08 NOTE — Assessment & Plan Note (Signed)
Better  

## 2018-12-08 NOTE — Assessment & Plan Note (Signed)
Diclofenac po prn ° Potential benefits of a long term NSAIDs use as well as potential risks  and complications were explained to the patient and were aknowledged. °

## 2018-12-08 NOTE — Assessment & Plan Note (Signed)
Alprazolam prn  Potential benefits of a long term benzodiazepines  use as well as potential risks  and complications were explained to the patient and were aknowledged. 

## 2018-12-08 NOTE — Assessment & Plan Note (Signed)
Patient is not taking Lipitor  Risks associated with treatment noncompliance were discussed. Compliance was encouraged.

## 2018-12-08 NOTE — Assessment & Plan Note (Signed)
A chronic issue Risks associated with treatment noncompliance were discussed. Compliance was encouraged. Patient is not taking Lipitor and losartan

## 2018-12-08 NOTE — Progress Notes (Signed)
  Virtual Visit via Telephone Note  I connected with [name] on 12/08/18 at  7:50 AM EDT by telephone and verified that I am speaking with the correct person using two identifiers.   I discussed the limitations, risks, security and privacy concerns of performing an evaluation and management service by telephone and the availability of in person appointments. I also discussed with the patient that there may be a patient responsible charge related to this service. The patient expressed understanding and agreed to proceed.   History of Present Illness:   Way to follow-up on anxiety, coronary disease, hypertension.  Inez Catalina lost weight and he is 297 pounds now.  He needs refills on isosorbide, diclofenac and Xanax.  He is taking aspirin.  He stopped taking other meds Observations/Objective:  He sounds good on the phone Assessment and Plan:  See plan Follow Up Instructions:    I discussed the assessment and treatment plan with the patient. The patient was provided an opportunity to ask questions and all were answered. The patient agreed with the plan and demonstrated an understanding of the instructions.   The patient was advised to call back or seek an in-person evaluation if the symptoms worsen or if the condition fails to improve as anticipated.  I provided 11 minutes of non-face-to-face time during this encounter.   Walker Kehr, MD

## 2019-03-31 ENCOUNTER — Other Ambulatory Visit: Payer: Self-pay

## 2019-03-31 ENCOUNTER — Ambulatory Visit (INDEPENDENT_AMBULATORY_CARE_PROVIDER_SITE_OTHER): Payer: Self-pay | Admitting: Internal Medicine

## 2019-03-31 ENCOUNTER — Encounter: Payer: Self-pay | Admitting: Internal Medicine

## 2019-03-31 VITALS — BP 126/74 | HR 83 | Temp 97.6°F | Ht 72.0 in | Wt 304.0 lb

## 2019-03-31 DIAGNOSIS — F419 Anxiety disorder, unspecified: Secondary | ICD-10-CM

## 2019-03-31 DIAGNOSIS — I10 Essential (primary) hypertension: Secondary | ICD-10-CM

## 2019-03-31 DIAGNOSIS — Z23 Encounter for immunization: Secondary | ICD-10-CM

## 2019-03-31 MED ORDER — ISOSORBIDE MONONITRATE ER 30 MG PO TB24: 30.0000 mg | ORAL_TABLET | Freq: Every day | ORAL | 3 refills | Status: DC

## 2019-03-31 MED ORDER — DICLOFENAC SODIUM 75 MG PO TBEC: 75.0000 mg | DELAYED_RELEASE_TABLET | Freq: Two times a day (BID) | ORAL | 5 refills | Status: DC | PRN

## 2019-03-31 MED ORDER — ALPRAZOLAM 1 MG PO TABS: 0.5000 mg | ORAL_TABLET | Freq: Two times a day (BID) | ORAL | 2 refills | Status: DC | PRN

## 2019-03-31 NOTE — Progress Notes (Signed)
Subjective:  Patient ID: [name], male    DOB: [birth year]  Age: 58 y.o. MRN: [id number]  CC: No chief complaint on file.   HPI [name] presents for LBP, COPD, CAD f/u  Outpatient Medications Prior to Visit  Medication Sig Dispense Refill  . albuterol (PROVENTIL HFA;VENTOLIN HFA) 108 (90 Base) MCG/ACT inhaler Inhale 2 puffs into the lungs every 6 (six) hours as needed for wheezing or shortness of breath. 1 Inhaler 5  . ALPRAZolam (XANAX) 1 MG tablet Take 0.5-1 tablets (0.5-1 mg total) by mouth 2 (two) times daily as needed. for anxiety 60 tablet 2  . aspirin EC 81 MG tablet Take 1 tablet (81 mg total) by mouth daily.    [name] atorvastatin (LIPITOR) 20 MG tablet Take 1 tablet (20 mg total) by mouth daily. 90 tablet 3  . budesonide-formoterol (SYMBICORT) 160-4.5 MCG/ACT inhaler Inhale 2 puffs into the lungs 2 (two) times daily. 1 Inhaler 11  . Cholecalciferol (VITAMIN D3) 2000 units capsule Take 1 capsule (2,000 Units total) by mouth daily. 100 capsule 3  . diclofenac (VOLTAREN) 75 MG EC tablet Take 1 tablet (75 mg total) by mouth 2 (two) times daily as needed for moderate pain (pc). 60 tablet 5  . gabapentin (NEURONTIN) 600 MG tablet Take 1 tablet (600 mg total) by mouth 3 (three) times daily. 270 tablet 3  . isosorbide mononitrate (IMDUR) 30 MG 24 hr tablet Take 1 tablet (30 mg total) by mouth daily. 90 tablet 3  . nitroGLYCERIN (NITROSTAT) 0.4 MG SL tablet Place 1 tablet (0.4 mg total) under the tongue every 5 (five) minutes as needed. 20 tablet 3  . loratadine (CLARITIN) 10 MG tablet Take 1 tablet (10 mg total) by mouth daily. 100 tablet 3  . losartan (COZAAR) 100 MG tablet Take 1 tablet (100 mg total) by mouth daily. 90 tablet 3   No facility-administered medications prior to visit.     ROS: Review of Systems  Constitutional: Positive for unexpected weight change. Negative for appetite change and fatigue.  HENT: Negative for congestion, nosebleeds,  sneezing, sore throat and trouble swallowing.   Eyes: Negative for itching and visual disturbance.  Respiratory: Negative for cough.   Cardiovascular: Negative for chest pain, palpitations and leg swelling.  Gastrointestinal: Negative for abdominal distention, blood in stool, diarrhea and nausea.  Genitourinary: Negative for frequency and hematuria.  Musculoskeletal: Positive for arthralgias and back pain. Negative for gait problem, joint swelling and neck pain.  Skin: Negative for rash.  Neurological: Negative for dizziness, tremors, speech difficulty and weakness.  Psychiatric/Behavioral: Negative for agitation, dysphoric mood, sleep disturbance and suicidal ideas. The patient is not nervous/anxious.     Objective:  BP 126/74 (BP Location: Left Arm, Patient Position: Sitting, Cuff Size: Large)   Pulse 83   Temp 97.6 F (36.4 C) (Oral)   Ht 6' (1.829 m)   Wt (!) 304 lb (137.9 kg)   SpO2 94%   BMI 41.23 kg/m   BP Readings from Last 3 Encounters:  03/31/19 126/74  06/11/18 126/78  03/12/18 124/66    Wt Readings from Last 3 Encounters:  03/31/19 (!) 304 lb (137.9 kg)  12/08/18 297 lb (134.7 kg)  06/11/18 (!) 310 lb (140.6 kg)    Physical Exam Constitutional:      General: He is not in acute distress.    Appearance: He is well-developed. He is obese.     Comments: NAD  Eyes:     Conjunctiva/sclera: Conjunctivae normal.  Pupils: Pupils are equal, round, and reactive to light.  Neck:     Musculoskeletal: Normal range of motion.     Thyroid: No thyromegaly.     Vascular: No JVD.  Cardiovascular:     Rate and Rhythm: Normal rate and regular rhythm.     Heart sounds: Normal heart sounds. No murmur. No friction rub. No gallop.   Pulmonary:     Effort: Pulmonary effort is normal. No respiratory distress.     Breath sounds: Normal breath sounds. No wheezing or rales.  Chest:     Chest wall: No tenderness.  Abdominal:     General: Bowel sounds are normal. There is no  distension.     Palpations: Abdomen is soft. There is no mass.     Tenderness: There is no abdominal tenderness. There is no guarding or rebound.  Musculoskeletal: Normal range of motion.        General: Tenderness present.  Lymphadenopathy:     Cervical: No cervical adenopathy.  Skin:    General: Skin is warm and dry.     Findings: No rash.  Neurological:     Mental Status: He is alert and oriented to person, place, and time.     Cranial Nerves: No cranial nerve deficit.     Motor: No abnormal muscle tone.     Coordination: Coordination normal.     Gait: Gait normal.     Deep Tendon Reflexes: Reflexes are normal and symmetric.  Psychiatric:        Behavior: Behavior normal.        Thought Content: Thought content normal.        Judgment: Judgment normal.     Lab Results  Component Value Date   WBC 6.4 12/29/2017   HGB 15.1 12/29/2017   HCT 44.2 12/29/2017   PLT 160.0 12/29/2017   GLUCOSE 149 (H) 12/29/2017   CHOL 113 12/29/2017   TRIG 335.0 (H) 12/29/2017   HDL 31.80 (L) 12/29/2017   LDLDIRECT 38.0 12/29/2017   LDLCALC 15 10/18/2013   ALT 37 12/29/2017   AST 18 12/29/2017   NA 141 12/29/2017   K 4.2 12/29/2017   CL 106 12/29/2017   CREATININE 0.90 12/29/2017   BUN 13 12/29/2017   CO2 26 12/29/2017   TSH 1.88 12/29/2017   PSA 0.32 12/29/2017   INR 0.9 05/22/2016   HGBA1C 5.8 01/18/2015    Dg Wrist Complete Right  Result Date: 06/05/2017 CLINICAL DATA:  Follow-up right wrist fracture.  Assess for healing. EXAM: RIGHT WRIST - COMPLETE 3+ VIEW COMPARISON:  Right wrist series of May 13, 2017 and May 22, 2017. FINDINGS: Again demonstrated is the avulsion fracture from the dorsal aspect of the distal radial metaphysis. It is visible on the lateral view only. No significant periosteal reaction is observed. Elsewhere no fracture is demonstrated. IMPRESSION: Stable appearance of the avulsion fracture of the dorsum of the distal radius. No significant periosteal  reaction is observed. Electronically Signed   By: David  [place] M.D.   On: 06/05/2017 09:45    Assessment & Plan:   There are no diagnoses linked to this encounter.   No orders of the defined types were placed in this encounter.    Follow-up: No follow-ups on file.  Walker Kehr, MD

## 2019-03-31 NOTE — Patient Instructions (Signed)

## 2019-04-01 ENCOUNTER — Encounter: Payer: Self-pay | Admitting: Internal Medicine

## 2019-04-01 NOTE — Assessment & Plan Note (Signed)
Losartan BP Readings from Last 3 Encounters:  03/31/19 126/74  06/11/18 126/78  03/12/18 124/66

## 2019-04-01 NOTE — Assessment & Plan Note (Signed)
Alprazolam prn  Potential benefits of a long term benzodiazepines  use as well as potential risks  and complications were explained to the patient and were aknowledged. 

## 2019-07-15 ENCOUNTER — Other Ambulatory Visit: Payer: Self-pay | Admitting: Internal Medicine

## 2019-07-29 ENCOUNTER — Other Ambulatory Visit: Payer: Self-pay

## 2019-07-29 ENCOUNTER — Ambulatory Visit (INDEPENDENT_AMBULATORY_CARE_PROVIDER_SITE_OTHER): Payer: 59 | Admitting: Internal Medicine

## 2019-07-29 ENCOUNTER — Encounter: Payer: Self-pay | Admitting: Internal Medicine

## 2019-07-29 VITALS — BP 144/86 | HR 85 | Temp 98.0°F | Ht 72.0 in | Wt 297.0 lb

## 2019-07-29 DIAGNOSIS — F419 Anxiety disorder, unspecified: Secondary | ICD-10-CM

## 2019-07-29 DIAGNOSIS — D485 Neoplasm of uncertain behavior of skin: Secondary | ICD-10-CM | POA: Diagnosis not present

## 2019-07-29 DIAGNOSIS — R109 Unspecified abdominal pain: Secondary | ICD-10-CM | POA: Insufficient documentation

## 2019-07-29 HISTORY — DX: Unspecified abdominal pain: R10.9

## 2019-07-29 MED ORDER — DICLOFENAC SODIUM 75 MG PO TBEC: 75.0000 mg | DELAYED_RELEASE_TABLET | Freq: Two times a day (BID) | ORAL | 5 refills | Status: DC | PRN

## 2019-07-29 MED ORDER — ISOSORBIDE MONONITRATE ER 30 MG PO TB24: 30.0000 mg | ORAL_TABLET | Freq: Every day | ORAL | 3 refills | Status: DC

## 2019-07-29 MED ORDER — ALPRAZOLAM 1 MG PO TABS: ORAL_TABLET | ORAL | 3 refills | Status: DC

## 2019-07-29 NOTE — Assessment & Plan Note (Addendum)
?  muscle strain vs other. I do not feel a hernia CT offered

## 2019-07-29 NOTE — Progress Notes (Signed)
Subjective:  Patient ID: [name], male    DOB: 24-Sep-1960  Age: 59 y.o. MRN: [id number]  CC: No chief complaint on file.   HPI [name] presents for LBP, anxiety, CAD f/u C/o ?hernia pain in the RLQ x 1 month after lifting a generator C/o skin lesion on R cheek  Outpatient Medications Prior to Visit  Medication Sig Dispense Refill  . albuterol (PROVENTIL HFA;VENTOLIN HFA) 108 (90 Base) MCG/ACT inhaler Inhale 2 puffs into the lungs every 6 (six) hours as needed for wheezing or shortness of breath. 1 Inhaler 5  . ALPRAZolam (XANAX) 1 MG tablet TAKE 1/2 TO 1 TABLET(0.5 TO 1 MG) BY MOUTH TWICE DAILY AS NEEDED FOR ANXIETY 60 tablet 1  . aspirin EC 81 MG tablet Take 1 tablet (81 mg total) by mouth daily.    [name] atorvastatin (LIPITOR) 20 MG tablet Take 1 tablet (20 mg total) by mouth daily. 90 tablet 3  . budesonide-formoterol (SYMBICORT) 160-4.5 MCG/ACT inhaler Inhale 2 puffs into the lungs 2 (two) times daily. 1 Inhaler 11  . Cholecalciferol (VITAMIN D3) 2000 units capsule Take 1 capsule (2,000 Units total) by mouth daily. 100 capsule 3  . diclofenac (VOLTAREN) 75 MG EC tablet Take 1 tablet (75 mg total) by mouth 2 (two) times daily as needed for moderate pain (pc). 60 tablet 5  . gabapentin (NEURONTIN) 600 MG tablet Take 1 tablet (600 mg total) by mouth 3 (three) times daily. 270 tablet 3  . isosorbide mononitrate (IMDUR) 30 MG 24 hr tablet Take 1 tablet (30 mg total) by mouth daily. 90 tablet 3  . nitroGLYCERIN (NITROSTAT) 0.4 MG SL tablet Place 1 tablet (0.4 mg total) under the tongue every 5 (five) minutes as needed. 20 tablet 3  . loratadine (CLARITIN) 10 MG tablet Take 1 tablet (10 mg total) by mouth daily. 100 tablet 3  . losartan (COZAAR) 100 MG tablet Take 1 tablet (100 mg total) by mouth daily. 90 tablet 3   No facility-administered medications prior to visit.    ROS: Review of Systems  Constitutional: Negative for appetite change, fatigue and  unexpected weight change.  HENT: Negative for congestion, nosebleeds, sneezing, sore throat and trouble swallowing.   Eyes: Negative for itching and visual disturbance.  Respiratory: Negative for cough.   Cardiovascular: Negative for chest pain, palpitations and leg swelling.  Gastrointestinal: Positive for abdominal pain. Negative for abdominal distention, blood in stool, diarrhea and nausea.  Genitourinary: Negative for frequency and hematuria.  Musculoskeletal: Positive for back pain. Negative for gait problem, joint swelling and neck pain.  Skin: Negative for rash.  Neurological: Negative for dizziness, tremors, speech difficulty and weakness.  Psychiatric/Behavioral: Negative for agitation, dysphoric mood and sleep disturbance. The patient is not nervous/anxious.     Objective:  BP (!) 144/86 (BP Location: Left Arm, Patient Position: Sitting, Cuff Size: Large)   Pulse 85   Temp 98 F (36.7 C) (Oral)   Ht 6' (1.829 m)   Wt 297 lb (134.7 kg)   SpO2 95%   BMI 40.28 kg/m   BP Readings from Last 3 Encounters:  07/29/19 (!) 144/86  03/31/19 126/74  06/11/18 126/78    Wt Readings from Last 3 Encounters:  07/29/19 297 lb (134.7 kg)  03/31/19 (!) 304 lb (137.9 kg)  12/08/18 297 lb (134.7 kg)    Physical Exam Constitutional:      General: He is not in acute distress.    Appearance: He is well-developed.  Comments: NAD  Eyes:     Conjunctiva/sclera: Conjunctivae normal.     Pupils: Pupils are equal, round, and reactive to light.  Neck:     Thyroid: No thyromegaly.     Vascular: No JVD.  Cardiovascular:     Rate and Rhythm: Normal rate and regular rhythm.     Heart sounds: Normal heart sounds. No murmur. No friction rub. No gallop.   Pulmonary:     Effort: Pulmonary effort is normal. No respiratory distress.     Breath sounds: Normal breath sounds. No wheezing or rales.  Chest:     Chest wall: No tenderness.  Abdominal:     General: Bowel sounds are normal. There  is no distension.     Palpations: Abdomen is soft. There is no mass.     Tenderness: There is no abdominal tenderness. There is no guarding or rebound.     Hernia: No hernia is present.  Musculoskeletal:        General: No tenderness. Normal range of motion.     Cervical back: Normal range of motion.  Lymphadenopathy:     Cervical: No cervical adenopathy.  Skin:    General: Skin is warm and dry.     Findings: No rash.  Neurological:     Mental Status: He is alert and oriented to person, place, and time.     Cranial Nerves: No cranial nerve deficit.     Motor: No abnormal muscle tone.     Coordination: Coordination normal.     Gait: Gait normal.     Deep Tendon Reflexes: Reflexes are normal and symmetric.  Psychiatric:        Behavior: Behavior normal.        Thought Content: Thought content normal.        Judgment: Judgment normal.   Abd - no masses, hernias that I can feel or see, NT pigm mole R cheek  Lab Results  Component Value Date   WBC 6.4 12/29/2017   HGB 15.1 12/29/2017   HCT 44.2 12/29/2017   PLT 160.0 12/29/2017   GLUCOSE 149 (H) 12/29/2017   CHOL 113 12/29/2017   TRIG 335.0 (H) 12/29/2017   HDL 31.80 (L) 12/29/2017   LDLDIRECT 38.0 12/29/2017   LDLCALC 15 10/18/2013   ALT 37 12/29/2017   AST 18 12/29/2017   NA 141 12/29/2017   K 4.2 12/29/2017   CL 106 12/29/2017   CREATININE 0.90 12/29/2017   BUN 13 12/29/2017   CO2 26 12/29/2017   TSH 1.88 12/29/2017   PSA 0.32 12/29/2017   INR 0.9 05/22/2016   HGBA1C 5.8 01/18/2015    DG Wrist Complete Right  Result Date: 06/05/2017 CLINICAL DATA:  Follow-up right wrist fracture.  Assess for healing. EXAM: RIGHT WRIST - COMPLETE 3+ VIEW COMPARISON:  Right wrist series of May 13, 2017 and May 22, 2017. FINDINGS: Again demonstrated is the avulsion fracture from the dorsal aspect of the distal radial metaphysis. It is visible on the lateral view only. No significant periosteal reaction is observed. Elsewhere  no fracture is demonstrated. IMPRESSION: Stable appearance of the avulsion fracture of the dorsum of the distal radius. No significant periosteal reaction is observed. Electronically Signed   By: David  Martinique M.D.   On: 06/05/2017 09:45    Assessment & Plan:     Follow-up: No follow-ups on file.  Walker Kehr, MD

## 2019-07-29 NOTE — Assessment & Plan Note (Signed)
Alprazolam prn

## 2019-07-29 NOTE — Assessment & Plan Note (Signed)
R cheek mole Derm ref

## 2019-08-11 ENCOUNTER — Telehealth: Payer: Self-pay | Admitting: Internal Medicine

## 2019-08-11 NOTE — Telephone Encounter (Signed)
Refaxing cpap supply form

## 2019-08-11 NOTE — Telephone Encounter (Signed)
New message:    [name] is calling in reference to a order for C-Pap machine supplies. He states when calling back ask for the documentation dept. Please advise.

## 2019-10-28 ENCOUNTER — Telehealth: Payer: Self-pay | Admitting: Internal Medicine

## 2019-10-28 NOTE — Telephone Encounter (Signed)
New message:    Pt is calling and states he has had a cold for about a week now. I offered a virtual appt but pt states he would just like a antibiotic called in to help with this cold. Please advise.

## 2019-10-29 MED ORDER — AZITHROMYCIN 250 MG PO TABS: ORAL_TABLET | ORAL | 0 refills | Status: DC

## 2019-10-29 NOTE — Telephone Encounter (Signed)
Z-Pak emailed.  Thanks

## 2019-10-29 NOTE — Telephone Encounter (Signed)
Pt informed of below.  

## 2019-11-29 ENCOUNTER — Encounter: Payer: Self-pay | Admitting: Internal Medicine

## 2019-11-29 ENCOUNTER — Ambulatory Visit (INDEPENDENT_AMBULATORY_CARE_PROVIDER_SITE_OTHER): Payer: 59 | Admitting: Internal Medicine

## 2019-11-29 ENCOUNTER — Other Ambulatory Visit: Payer: Self-pay

## 2019-11-29 VITALS — BP 148/96 | HR 93 | Temp 98.0°F | Ht 72.0 in | Wt 289.0 lb

## 2019-11-29 DIAGNOSIS — Z0000 Encounter for general adult medical examination without abnormal findings: Secondary | ICD-10-CM | POA: Diagnosis not present

## 2019-11-29 DIAGNOSIS — I25119 Atherosclerotic heart disease of native coronary artery with unspecified angina pectoris: Secondary | ICD-10-CM

## 2019-11-29 DIAGNOSIS — I10 Essential (primary) hypertension: Secondary | ICD-10-CM | POA: Diagnosis not present

## 2019-11-29 DIAGNOSIS — F419 Anxiety disorder, unspecified: Secondary | ICD-10-CM | POA: Diagnosis not present

## 2019-11-29 DIAGNOSIS — H6122 Impacted cerumen, left ear: Secondary | ICD-10-CM

## 2019-11-29 DIAGNOSIS — R059 Cough, unspecified: Secondary | ICD-10-CM

## 2019-11-29 DIAGNOSIS — R05 Cough: Secondary | ICD-10-CM

## 2019-11-29 HISTORY — DX: Cough, unspecified: R05.9

## 2019-11-29 MED ORDER — ISOSORBIDE MONONITRATE ER 30 MG PO TB24: 30.0000 mg | ORAL_TABLET | Freq: Every day | ORAL | 3 refills | Status: DC

## 2019-11-29 MED ORDER — DICLOFENAC SODIUM 75 MG PO TBEC: 75.0000 mg | DELAYED_RELEASE_TABLET | Freq: Two times a day (BID) | ORAL | 5 refills | Status: DC | PRN

## 2019-11-29 MED ORDER — ALPRAZOLAM 1 MG PO TABS: ORAL_TABLET | ORAL | 3 refills | Status: DC

## 2019-11-29 NOTE — Progress Notes (Signed)
Subjective:  Patient ID: [name], male    DOB: 09-22-1960  Age: 59 y.o. MRN: [id number]  CC: No chief complaint on file.   HPI [name] presents for LBP, anxiety, CAD f/u C/o L ear catching water x years C/o cough - smoking 2 ppd Lost wt  Outpatient Medications Prior to Visit  Medication Sig Dispense Refill  . ALPRAZolam (XANAX) 1 MG tablet TAKE 1/2 TO 1 TABLET(0.5 TO 1 MG) BY MOUTH TWICE DAILY AS NEEDED FOR ANXIETY 60 tablet 3  . aspirin EC 81 MG tablet Take 1 tablet (81 mg total) by mouth daily.    [name] azithromycin (ZITHROMAX Z-PAK) 250 MG tablet As directed 6 tablet 0  . diclofenac (VOLTAREN) 75 MG EC tablet Take 1 tablet (75 mg total) by mouth 2 (two) times daily as needed for moderate pain (pc). 60 tablet 5  . albuterol (PROVENTIL HFA;VENTOLIN HFA) 108 (90 Base) MCG/ACT inhaler Inhale 2 puffs into the lungs every 6 (six) hours as needed for wheezing or shortness of breath. (Patient not taking: Reported on 11/29/2019) 1 Inhaler 5  . atorvastatin (LIPITOR) 20 MG tablet Take 1 tablet (20 mg total) by mouth daily. (Patient not taking: Reported on 11/29/2019) 90 tablet 3  . budesonide-formoterol (SYMBICORT) 160-4.5 MCG/ACT inhaler Inhale 2 puffs into the lungs 2 (two) times daily. (Patient not taking: Reported on 11/29/2019) 1 Inhaler 11  . Cholecalciferol (VITAMIN D3) 2000 units capsule Take 1 capsule (2,000 Units total) by mouth daily. (Patient not taking: Reported on 11/29/2019) 100 capsule 3  . gabapentin (NEURONTIN) 600 MG tablet Take 1 tablet (600 mg total) by mouth 3 (three) times daily. (Patient not taking: Reported on 11/29/2019) 270 tablet 3  . isosorbide mononitrate (IMDUR) 30 MG 24 hr tablet Take 1 tablet (30 mg total) by mouth daily. (Patient not taking: Reported on 11/29/2019) 90 tablet 3  . loratadine (CLARITIN) 10 MG tablet Take 1 tablet (10 mg total) by mouth daily. 100 tablet 3  . losartan (COZAAR) 100 MG tablet Take 1 tablet (100 mg total) by  mouth daily. 90 tablet 3  . nitroGLYCERIN (NITROSTAT) 0.4 MG SL tablet Place 1 tablet (0.4 mg total) under the tongue every 5 (five) minutes as needed. (Patient not taking: Reported on 11/29/2019) 20 tablet 3   No facility-administered medications prior to visit.    ROS: Review of Systems  Constitutional: Negative for appetite change, fatigue and unexpected weight change.  HENT: Negative for congestion, nosebleeds, sneezing, sore throat and trouble swallowing.   Eyes: Negative for itching and visual disturbance.  Respiratory: Negative for cough.   Cardiovascular: Negative for chest pain, palpitations and leg swelling.  Gastrointestinal: Negative for abdominal distention, blood in stool, diarrhea and nausea.  Genitourinary: Negative for frequency and hematuria.  Musculoskeletal: Negative for back pain, gait problem, joint swelling and neck pain.  Skin: Negative for rash.  Neurological: Negative for dizziness, tremors, speech difficulty and weakness.  Psychiatric/Behavioral: Negative for agitation, dysphoric mood and sleep disturbance. The patient is not nervous/anxious.     Objective:  BP (!) 148/96 (BP Location: Right Arm, Patient Position: Sitting, Cuff Size: Large)   Pulse 93   Temp 98 F (36.7 C) (Oral)   Ht 6' (1.829 m)   Wt 289 lb (131.1 kg)   SpO2 95%   BMI 39.20 kg/m   BP Readings from Last 3 Encounters:  11/29/19 (!) 148/96  07/29/19 (!) 144/86  03/31/19 126/74    Wt Readings from Last 3 Encounters:  11/29/19 289 lb (131.1 kg)  07/29/19 297 lb (134.7 kg)  03/31/19 (!) 304 lb (137.9 kg)    Physical Exam Constitutional:      General: He is not in acute distress.    Appearance: He is well-developed.     Comments: NAD  Eyes:     Conjunctiva/sclera: Conjunctivae normal.     Pupils: Pupils are equal, round, and reactive to light.  Neck:     Thyroid: No thyromegaly.     Vascular: No JVD.  Cardiovascular:     Rate and Rhythm: Normal rate and regular rhythm.      Heart sounds: Normal heart sounds. No murmur heard.  No friction rub. No gallop.   Pulmonary:     Effort: Pulmonary effort is normal. No respiratory distress.     Breath sounds: Normal breath sounds. No wheezing or rales.  Chest:     Chest wall: No tenderness.  Abdominal:     General: Bowel sounds are normal. There is no distension.     Palpations: Abdomen is soft. There is no mass.     Tenderness: There is no abdominal tenderness. There is no guarding or rebound.  Musculoskeletal:        General: No tenderness. Normal range of motion.     Cervical back: Normal range of motion.  Lymphadenopathy:     Cervical: No cervical adenopathy.  Skin:    General: Skin is warm and dry.     Findings: No rash.  Neurological:     Mental Status: He is alert and oriented to person, place, and time.     Cranial Nerves: No cranial nerve deficit.     Motor: No abnormal muscle tone.     Coordination: Coordination normal.     Gait: Gait normal.     Deep Tendon Reflexes: Reflexes are normal and symmetric.  Psychiatric:        Behavior: Behavior normal.        Thought Content: Thought content normal.        Judgment: Judgment normal.    Wax L ear   Lab Results  Component Value Date   WBC 6.4 12/29/2017   HGB 15.1 12/29/2017   HCT 44.2 12/29/2017   PLT 160.0 12/29/2017   GLUCOSE 149 (H) 12/29/2017   CHOL 113 12/29/2017   TRIG 335.0 (H) 12/29/2017   HDL 31.80 (L) 12/29/2017   LDLDIRECT 38.0 12/29/2017   LDLCALC 15 10/18/2013   ALT 37 12/29/2017   AST 18 12/29/2017   NA 141 12/29/2017   K 4.2 12/29/2017   CL 106 12/29/2017   CREATININE 0.90 12/29/2017   BUN 13 12/29/2017   CO2 26 12/29/2017   TSH 1.88 12/29/2017   PSA 0.32 12/29/2017   INR 0.9 05/22/2016   HGBA1C 5.8 01/18/2015    DG Wrist Complete Right  Result Date: 06/05/2017 CLINICAL DATA:  Follow-up right wrist fracture.  Assess for healing. EXAM: RIGHT WRIST - COMPLETE 3+ VIEW COMPARISON:  Right wrist series of May 13, 2017 and May 22, 2017. FINDINGS: Again demonstrated is the avulsion fracture from the dorsal aspect of the distal radial metaphysis. It is visible on the lateral view only. No significant periosteal reaction is observed. Elsewhere no fracture is demonstrated. IMPRESSION: Stable appearance of the avulsion fracture of the dorsum of the distal radius. No significant periosteal reaction is observed. Electronically Signed   By: David  Martinique M.D.   On: 06/05/2017 09:45    Assessment & Plan:  Walker Kehr, MD

## 2019-11-29 NOTE — Assessment & Plan Note (Signed)
Wt Readings from Last 3 Encounters:  11/29/19 289 lb (131.1 kg)  07/29/19 297 lb (134.7 kg)  03/31/19 (!) 304 lb (137.9 kg)

## 2019-11-29 NOTE — Assessment & Plan Note (Signed)
Alprazolam prn  Potential benefits of a long term benzodiazepines  use as well as potential risks  and complications were explained to the patient and were aknowledged. 

## 2019-11-29 NOTE — Assessment & Plan Note (Signed)
Not taking Losartan Risks associated with treatment noncompliance were discussed. Compliance was encouraged.

## 2019-11-29 NOTE — Assessment & Plan Note (Signed)
Isosorbide

## 2019-11-29 NOTE — Assessment & Plan Note (Signed)
The pt will irrigate his ear at home

## 2019-11-29 NOTE — Assessment & Plan Note (Signed)
Smoker's cough Pt declined CXR

## 2020-02-29 ENCOUNTER — Telehealth: Payer: Self-pay | Admitting: Internal Medicine

## 2020-02-29 MED ORDER — TAMSULOSIN HCL 0.4 MG PO CAPS: 0.4000 mg | ORAL_CAPSULE | Freq: Every day | ORAL | 3 refills | Status: DC | PRN

## 2020-02-29 NOTE — Telephone Encounter (Signed)
Patient called and is requesting a refill for Tamsulosin HCL .4mg . He said that he has kidney stones again and this medication was prescribed to him. He doesn't remember if Dr. Alain Marion prescribed it or someone from the ED. This is not on his current med list. It can be sent to [facility] [place] - [address] [place] [address] AT [place]   Please call the pt if rx is sent in: [phone]

## 2020-02-29 NOTE — Addendum Note (Signed)
Addended by: Cassandria Anger on: 02/29/2020 03:49 PM   Modules accepted: Orders

## 2020-02-29 NOTE — Telephone Encounter (Signed)
Okay.  Thanks.

## 2020-03-01 NOTE — Telephone Encounter (Signed)
Pt aware & has picked up med from pharmacy.

## 2020-03-07 ENCOUNTER — Ambulatory Visit (INDEPENDENT_AMBULATORY_CARE_PROVIDER_SITE_OTHER): Payer: 59 | Admitting: Internal Medicine

## 2020-03-07 ENCOUNTER — Other Ambulatory Visit: Payer: Self-pay

## 2020-03-07 ENCOUNTER — Encounter: Payer: Self-pay | Admitting: Internal Medicine

## 2020-03-07 VITALS — BP 150/88 | HR 76 | Temp 98.1°F | Ht 72.0 in | Wt 293.0 lb

## 2020-03-07 DIAGNOSIS — Z23 Encounter for immunization: Secondary | ICD-10-CM

## 2020-03-07 DIAGNOSIS — M545 Low back pain, unspecified: Secondary | ICD-10-CM | POA: Diagnosis not present

## 2020-03-07 DIAGNOSIS — I10 Essential (primary) hypertension: Secondary | ICD-10-CM

## 2020-03-07 DIAGNOSIS — R0600 Dyspnea, unspecified: Secondary | ICD-10-CM | POA: Diagnosis not present

## 2020-03-07 DIAGNOSIS — G4730 Sleep apnea, unspecified: Secondary | ICD-10-CM

## 2020-03-07 DIAGNOSIS — Z1211 Encounter for screening for malignant neoplasm of colon: Secondary | ICD-10-CM

## 2020-03-07 DIAGNOSIS — H6123 Impacted cerumen, bilateral: Secondary | ICD-10-CM

## 2020-03-07 DIAGNOSIS — R0609 Other forms of dyspnea: Secondary | ICD-10-CM

## 2020-03-07 DIAGNOSIS — M79604 Pain in right leg: Secondary | ICD-10-CM

## 2020-03-07 DIAGNOSIS — H6122 Impacted cerumen, left ear: Secondary | ICD-10-CM

## 2020-03-07 DIAGNOSIS — J449 Chronic obstructive pulmonary disease, unspecified: Secondary | ICD-10-CM | POA: Diagnosis not present

## 2020-03-07 MED ORDER — NEOMYCIN-POLYMYXIN-HC 3.5-10000-1 OT SOLN
3.0000 [drp] | Freq: Three times a day (TID) | OTIC | 3 refills | Status: DC
Start: 2020-03-07 — End: 2020-04-20

## 2020-03-07 NOTE — Assessment & Plan Note (Signed)
BP Readings from Last 3 Encounters:  03/07/20 (!) 150/88  11/29/19 (!) 148/96  07/29/19 (!) 144/86

## 2020-03-07 NOTE — Assessment & Plan Note (Signed)
Chest CT 

## 2020-03-07 NOTE — Progress Notes (Addendum)
Subjective:  Patient ID: [name], male    DOB: 12/02/1960  Age: 59 y.o. MRN: [id number]  CC: Follow-up   HPI Bralynn Donado presents for anxiety, dyslipidemia C/o pains on the L side x 2 weeks - he is L handed. Not taking Lipitor. Pt refused COVID19 inj C/o L earache, CPAP   Outpatient Medications Prior to Visit  Medication Sig Dispense Refill  . albuterol (PROVENTIL HFA;VENTOLIN HFA) 108 (90 Base) MCG/ACT inhaler Inhale 2 puffs into the lungs every 6 (six) hours as needed for wheezing or shortness of breath. 1 Inhaler 5  . ALPRAZolam (XANAX) 1 MG tablet TAKE 1/2 TO 1 TABLET(0.5 TO 1 MG) BY MOUTH TWICE DAILY AS NEEDED FOR ANXIETY 60 tablet 3  . aspirin EC 81 MG tablet Take 1 tablet (81 mg total) by mouth daily.    [name] atorvastatin (LIPITOR) 20 MG tablet Take 1 tablet (20 mg total) by mouth daily. 90 tablet 3  . budesonide-formoterol (SYMBICORT) 160-4.5 MCG/ACT inhaler Inhale 2 puffs into the lungs 2 (two) times daily. 1 Inhaler 11  . Cholecalciferol (VITAMIN D3) 2000 units capsule Take 1 capsule (2,000 Units total) by mouth daily. 100 capsule 3  . diclofenac (VOLTAREN) 75 MG EC tablet Take 1 tablet (75 mg total) by mouth 2 (two) times daily as needed for moderate pain (pc). 60 tablet 5  . gabapentin (NEURONTIN) 600 MG tablet Take 1 tablet (600 mg total) by mouth 3 (three) times daily. 270 tablet 3  . isosorbide mononitrate (IMDUR) 30 MG 24 hr tablet Take 1 tablet (30 mg total) by mouth daily. 90 tablet 3  . nitroGLYCERIN (NITROSTAT) 0.4 MG SL tablet Place 1 tablet (0.4 mg total) under the tongue every 5 (five) minutes as needed. 20 tablet 3  . tamsulosin (FLOMAX) 0.4 MG CAPS capsule Take 1 capsule (0.4 mg total) by mouth daily as needed. Use for kidney stones 30 capsule 3  . azithromycin (ZITHROMAX Z-PAK) 250 MG tablet As directed (Patient not taking: Reported on 03/07/2020) 6 tablet 0  . loratadine (CLARITIN) 10 MG tablet Take 1 tablet (10 mg total) by mouth  daily. 100 tablet 3  . losartan (COZAAR) 100 MG tablet Take 1 tablet (100 mg total) by mouth daily. 90 tablet 3   No facility-administered medications prior to visit.    ROS: Review of Systems  Constitutional: Positive for unexpected weight change. Negative for appetite change and fatigue.  HENT: Negative for congestion, nosebleeds, sneezing, sore throat and trouble swallowing.   Eyes: Negative for itching and visual disturbance.  Respiratory: Negative for cough.   Cardiovascular: Negative for chest pain, palpitations and leg swelling.  Gastrointestinal: Negative for abdominal distention, blood in stool, diarrhea and nausea.  Genitourinary: Negative for frequency and hematuria.  Musculoskeletal: Positive for arthralgias and back pain. Negative for gait problem, joint swelling and neck pain.  Skin: Negative for rash.  Neurological: Negative for dizziness, tremors, speech difficulty and weakness.  Psychiatric/Behavioral: Negative for agitation, dysphoric mood and sleep disturbance. The patient is nervous/anxious.     Objective:  BP (!) 150/88 (BP Location: Right Arm, Patient Position: Sitting, Cuff Size: Large)   Pulse 76   Temp 98.1 F (36.7 C) (Oral)   Ht 6' (1.829 m)   Wt 293 lb (132.9 kg)   SpO2 93%   BMI 39.74 kg/m   BP Readings from Last 3 Encounters:  03/07/20 (!) 150/88  11/29/19 (!) 148/96  07/29/19 (!) 144/86    Wt Readings from Last 3 Encounters:  03/07/20 293 lb (132.9 kg)  11/29/19 289 lb (131.1 kg)  07/29/19 297 lb (134.7 kg)    Physical Exam Constitutional:      General: He is not in acute distress.    Appearance: He is well-developed. He is obese.     Comments: NAD  Eyes:     Conjunctiva/sclera: Conjunctivae normal.     Pupils: Pupils are equal, round, and reactive to light.  Neck:     Thyroid: No thyromegaly.     Vascular: No JVD.  Cardiovascular:     Rate and Rhythm: Normal rate and regular rhythm.     Heart sounds: Normal heart sounds. No  murmur heard. No friction rub. No gallop.   Pulmonary:     Effort: Pulmonary effort is normal. No respiratory distress.     Breath sounds: Normal breath sounds. No wheezing or rales.  Chest:     Chest wall: No tenderness.  Abdominal:     General: Bowel sounds are normal. There is no distension.     Palpations: Abdomen is soft. There is no mass.     Tenderness: There is no abdominal tenderness. There is no guarding or rebound.  Musculoskeletal:        General: No tenderness. Normal range of motion.     Cervical back: Normal range of motion.  Lymphadenopathy:     Cervical: No cervical adenopathy.  Skin:    General: Skin is warm and dry.     Findings: No rash.  Neurological:     Mental Status: He is alert and oriented to person, place, and time.     Cranial Nerves: No cranial nerve deficit.     Motor: No abnormal muscle tone.     Coordination: Coordination normal.     Gait: Gait normal.     Deep Tendon Reflexes: Reflexes are normal and symmetric.  Psychiatric:        Behavior: Behavior normal.        Thought Content: Thought content normal.        Judgment: Judgment normal.   wax L   Procedure Note :     Procedure :  Ear irrigation right and left ears   Indication:  Cerumen impaction  left ear   Risks, including pain, dizziness, eardrum perforation, bleeding, infection and others as well as benefits were explained to the patient in detail. Verbal consent was obtained and the patient agreed to proceed.    We used "The Elephant Ear Irrigation Device" filled with lukewarm water for irrigation. A large amount wax was recovered from both ears. Procedure has also required manual wax removal/instrumentation with an ear wax curette and ear forceps on the right and left ears.   Tolerated well. Complications: None.   Postprocedure instructions :  Call if problems.    Lab Results  Component Value Date   WBC 6.4 12/29/2017   HGB 15.1 12/29/2017   HCT 44.2 12/29/2017   PLT 160.0  12/29/2017   GLUCOSE 149 (H) 12/29/2017   CHOL 113 12/29/2017   TRIG 335.0 (H) 12/29/2017   HDL 31.80 (L) 12/29/2017   LDLDIRECT 38.0 12/29/2017   LDLCALC 15 10/18/2013   ALT 37 12/29/2017   AST 18 12/29/2017   NA 141 12/29/2017   K 4.2 12/29/2017   CL 106 12/29/2017   CREATININE 0.90 12/29/2017   BUN 13 12/29/2017   CO2 26 12/29/2017   TSH 1.88 12/29/2017   PSA 0.32 12/29/2017   INR 0.9 05/22/2016   HGBA1C 5.8  01/18/2015    DG Wrist Complete Right  Result Date: 06/05/2017 CLINICAL DATA:  Follow-up right wrist fracture.  Assess for healing. EXAM: RIGHT WRIST - COMPLETE 3+ VIEW COMPARISON:  Right wrist series of May 13, 2017 and May 22, 2017. FINDINGS: Again demonstrated is the avulsion fracture from the dorsal aspect of the distal radial metaphysis. It is visible on the lateral view only. No significant periosteal reaction is observed. Elsewhere no fracture is demonstrated. IMPRESSION: Stable appearance of the avulsion fracture of the dorsum of the distal radius. No significant periosteal reaction is observed. Electronically Signed   By: David  Martinique M.D.   On: 06/05/2017 09:45    Assessment & Plan:    Walker Kehr, MD

## 2020-03-07 NOTE — Progress Notes (Signed)
Patient consent obtained to irrigate left ear. Irrigation with water and Dulcolax performed. Full view of tympanic membranes after procedure per MD. Patient tolerated procedure well.

## 2020-03-07 NOTE — Assessment & Plan Note (Signed)
MSK

## 2020-03-07 NOTE — Assessment & Plan Note (Signed)
Wt Readings from Last 3 Encounters:  03/07/20 293 lb (132.9 kg)  11/29/19 289 lb (131.1 kg)  07/29/19 297 lb (134.7 kg)

## 2020-03-10 ENCOUNTER — Encounter: Payer: Self-pay | Admitting: Internal Medicine

## 2020-03-10 NOTE — Assessment & Plan Note (Signed)
See procedure 

## 2020-03-29 ENCOUNTER — Other Ambulatory Visit: Payer: Self-pay

## 2020-03-29 ENCOUNTER — Ambulatory Visit (INDEPENDENT_AMBULATORY_CARE_PROVIDER_SITE_OTHER)
Admission: RE | Admit: 2020-03-29 | Discharge: 2020-03-29 | Disposition: A | Discharge status: Home / Self Care | Payer: 59 | Source: Ambulatory Visit | Attending: Internal Medicine | Admitting: Internal Medicine

## 2020-03-29 DIAGNOSIS — R0600 Dyspnea, unspecified: Secondary | ICD-10-CM | POA: Diagnosis not present

## 2020-03-29 DIAGNOSIS — J449 Chronic obstructive pulmonary disease, unspecified: Secondary | ICD-10-CM | POA: Diagnosis not present

## 2020-03-29 DIAGNOSIS — R0609 Other forms of dyspnea: Secondary | ICD-10-CM

## 2020-03-30 ENCOUNTER — Telehealth: Payer: Self-pay | Admitting: Internal Medicine

## 2020-03-30 NOTE — Telephone Encounter (Signed)
   Patient calling to discuss CT scan results

## 2020-03-31 ENCOUNTER — Telehealth: Payer: Self-pay | Admitting: Internal Medicine

## 2020-03-31 NOTE — Telephone Encounter (Signed)
Patient states the company that provides the bipap machine reached out to him and stated they needed office notes supporting why the patient needs the machine. Fax# [phone]

## 2020-03-31 NOTE — Telephone Encounter (Signed)
    Pleas return call to patient

## 2020-03-31 NOTE — Telephone Encounter (Signed)
[name], it is in the comments to the test now.  Thanks

## 2020-03-31 NOTE — Telephone Encounter (Signed)
Faxed MD last OV notes w/CT.Marland KitchenJohny Chess

## 2020-03-31 NOTE — Telephone Encounter (Signed)
Called pt there was no answer LMOM RTC concerning CT.Marland KitchenJohny Chess

## 2020-04-03 ENCOUNTER — Telehealth: Payer: Self-pay | Admitting: Internal Medicine

## 2020-04-03 MED ORDER — BUDESONIDE-FORMOTEROL FUMARATE 160-4.5 MCG/ACT IN AERO
2.0000 | INHALATION_SPRAY | Freq: Two times a day (BID) | RESPIRATORY_TRACT | 11 refills | Status: DC
Start: 2020-04-03 — End: 2021-04-12

## 2020-04-03 NOTE — Telephone Encounter (Signed)
Reviewed chart pt is up-to-date sent refills to [facility]../[facility]  

## 2020-04-03 NOTE — Telephone Encounter (Signed)
See results note pt was called back concerning labs.Marland KitchenJohny Chandler

## 2020-04-03 NOTE — Telephone Encounter (Signed)
Patient needs refills on the following medication.  His RX has exp 05/20. budesonide-formoterol (SYMBICORT) 160-4.5 MCG/ACT inhaler  Pharmacy on file.

## 2020-04-17 ENCOUNTER — Telehealth: Payer: Self-pay | Admitting: Internal Medicine

## 2020-04-17 NOTE — Telephone Encounter (Signed)
RE: colonoscopy  Lucy, Pls ask the pt to call Dr Lynne Leader office. The referral for colonoscopy was placed in October. Thx

## 2020-04-18 ENCOUNTER — Other Ambulatory Visit: Payer: Self-pay | Admitting: Internal Medicine

## 2020-04-18 NOTE — Telephone Encounter (Signed)
Notified pt w/MD response.../lmb 

## 2020-04-20 ENCOUNTER — Telehealth: Payer: Self-pay | Admitting: Internal Medicine

## 2020-04-20 ENCOUNTER — Other Ambulatory Visit: Payer: Self-pay

## 2020-04-20 ENCOUNTER — Ambulatory Visit (AMBULATORY_SURGERY_CENTER): Payer: Self-pay | Admitting: *Deleted

## 2020-04-20 VITALS — Ht 72.0 in | Wt 293.0 lb

## 2020-04-20 DIAGNOSIS — Z1211 Encounter for screening for malignant neoplasm of colon: Secondary | ICD-10-CM

## 2020-04-20 DIAGNOSIS — Z01818 Encounter for other preprocedural examination: Secondary | ICD-10-CM

## 2020-04-20 MED ORDER — NA SULFATE-K SULFATE-MG SULF 17.5-3.13-1.6 GM/177ML PO SOLN: ORAL | 0 refills | Status: DC

## 2020-04-20 NOTE — Telephone Encounter (Signed)
    [name] from [facility] requesting additional information for cpap Last office visit note 10/26 did not outline the use and benefit of machine, addendum needed  [place] [phone]

## 2020-04-20 NOTE — Progress Notes (Signed)
Patient is here in-person for PV. Patient denies any allergies to eggs or soy. Patient denies any problems with anesthesia/sedation. Patient denies any oxygen use at home. Patient denies taking any diet/weight loss medications or blood thinners. Patient is not being treated for MRSA or C-diff. Patient is aware of our care-partner policy and [id number] safety protocol. COVID-19 test  On 12/17 at 830 am, pt is aware.

## 2020-04-21 NOTE — Assessment & Plan Note (Signed)
[name] is compliant with CPAP and has been using and benefiting from CPAP therapy.  Continue with CPAP

## 2020-04-21 NOTE — Telephone Encounter (Addendum)
Okay.  Done.  Thanks 

## 2020-04-22 ENCOUNTER — Other Ambulatory Visit: Payer: Self-pay | Admitting: Internal Medicine

## 2020-04-24 NOTE — Telephone Encounter (Signed)
   Patient requesting addendum be sent to [employer] Fax [phone] or [phone]

## 2020-04-24 NOTE — Telephone Encounter (Signed)
Faxed addendum notes from 10/26 to [phone].Marland KitchenJohny Chess

## 2020-04-28 ENCOUNTER — Other Ambulatory Visit: Payer: Self-pay | Admitting: Gastroenterology

## 2020-04-28 LAB — SARS CORONAVIRUS 2 (TAT 6-24 HRS): SARS Coronavirus 2: NEGATIVE

## 2020-05-02 ENCOUNTER — Ambulatory Visit (AMBULATORY_SURGERY_CENTER): Payer: 59 | Admitting: Gastroenterology

## 2020-05-02 ENCOUNTER — Other Ambulatory Visit: Payer: Self-pay

## 2020-05-02 ENCOUNTER — Encounter: Payer: Self-pay | Admitting: Gastroenterology

## 2020-05-02 VITALS — BP 123/74 | HR 77 | Temp 97.7°F | Resp 24 | Ht 72.0 in | Wt 293.0 lb

## 2020-05-02 DIAGNOSIS — Z86010 Personal history of colonic polyps: Secondary | ICD-10-CM

## 2020-05-02 DIAGNOSIS — K635 Polyp of colon: Secondary | ICD-10-CM | POA: Diagnosis not present

## 2020-05-02 DIAGNOSIS — D123 Benign neoplasm of transverse colon: Secondary | ICD-10-CM

## 2020-05-02 DIAGNOSIS — D120 Benign neoplasm of cecum: Secondary | ICD-10-CM

## 2020-05-02 DIAGNOSIS — D125 Benign neoplasm of sigmoid colon: Secondary | ICD-10-CM

## 2020-05-02 DIAGNOSIS — D175 Benign lipomatous neoplasm of intra-abdominal organs: Secondary | ICD-10-CM | POA: Diagnosis not present

## 2020-05-02 MED ORDER — SODIUM CHLORIDE 0.9 % IV SOLN: 500.0000 mL | Freq: Once | INTRAVENOUS | Status: DC

## 2020-05-02 NOTE — Progress Notes (Signed)
Called to room to assist during endoscopic procedure.  Patient ID and intended procedure confirmed with present staff. Received instructions for my participation in the procedure from the performing physician.  

## 2020-05-02 NOTE — Op Note (Signed)
[place] Patient Name: [name] Procedure Date: 05/02/2020 10:58 AM MRN: [id number] Endoscopist: Ladene Artist , MD Age: 59 Referring MD:  Date of Birth: [birth year] Gender: Male Account #: [id number] Procedure:                Colonoscopy Indications:              Surveillance: Personal history of adenomatous                            polyps on last colonoscopy > 5 years ago Medicines:                Monitored Anesthesia Care Procedure:                Pre-Anesthesia Assessment:                           - Prior to the procedure, a History and Physical                            was performed, and patient medications and                            allergies were reviewed. The patient's tolerance of                            previous anesthesia was also reviewed. The risks                            and benefits of the procedure and the sedation                            options and risks were discussed with the patient.                            All questions were answered, and informed consent                            was obtained. Prior Anticoagulants: The patient has                            taken no previous anticoagulant or antiplatelet                            agents. ASA Grade Assessment: III - A patient with                            severe systemic disease. After reviewing the risks                            and benefits, the patient was deemed in                            satisfactory condition to undergo the procedure.  After obtaining informed consent, the colonoscope                            was passed under direct vision. Throughout the                            procedure, the patient's blood pressure, pulse, and                            oxygen saturations were monitored continuously. The                            Olympus CF-HQ190 [id number]) Colonoscope was                            introduced through  the anus and advanced to the the                            cecum, identified by appendiceal orifice and                            ileocecal valve. The ileocecal valve, appendiceal                            orifice, and rectum were photographed. The quality                            of the bowel preparation was adequate after                            extensive lavage and suction. The colonoscopy was                            performed without difficulty. The patient tolerated                            the procedure well. Scope In: 11:09:06 AM Scope Out: 11:33:25 AM Scope Withdrawal Time: 0 hours 21 minutes 17 seconds  Total Procedure Duration: 0 hours 24 minutes 19 seconds  Findings:                 The perianal and digital rectal examinations were                            normal.                           Three sessile polyps were found in the sigmoid                            colon, descending colon and ileocecal valve. The                            polyps were 4 to 7 mm in size. These polyps were  removed with a cold snare. Resection and retrieval                            were complete.                           There was a medium-sized lipoma, 15 mm in diameter,                            in the transverse colon. Biopsies were taken with a                            cold forceps for histology.                           Many small-mouthed diverticula were found in the                            left colon. There was evidence of diverticular                            spasm. There was evidence of an impacted                            diverticulum. There was no evidence of diverticular                            bleeding.                           Internal hemorrhoids were found during                            retroflexion. The hemorrhoids were small and Grade                            I (internal hemorrhoids that do not prolapse).                            The exam was otherwise without abnormality on                            direct and retroflexion views. Complications:            No immediate complications. Estimated blood loss:                            None. Estimated Blood Loss:     Estimated blood loss: none. Impression:               - Three 4 to 7 mm polyps in the sigmoid colon, in                            the descending colon and at the ileocecal valve,  removed with a cold snare. Resected and retrieved.                           - Medium-sized lipoma in the transverse colon.                            Biopsied.                           - Mild diverticulosis in the left colon.                           - Internal hemorrhoids.                           - The examination was otherwise normal on direct                            and retroflexion views. Recommendation:           - Repeat colonoscopy after studies are complete for                            surveillance based on pathology results wiht a more                            extensive bowel prep.                           - Patient has a contact number available for                            emergencies. The signs and symptoms of potential                            delayed complications were discussed with the                            patient. Return to normal activities tomorrow.                            Written discharge instructions were provided to the                            patient.                           - High fiber diet.                           - Continue present medications.                           - Await pathology results. Ladene Artist, MD 05/02/2020 11:39:08 AM This report has been signed electronically.

## 2020-05-02 NOTE — Progress Notes (Signed)
pt tolerated well. VSS. awake and to recovery. Report given to RN.  

## 2020-05-02 NOTE — Patient Instructions (Signed)
Read all of the handouts given to you by your recovery room nurse. ? ?YOU HAD AN ENDOSCOPIC PROCEDURE TODAY AT THE [facility]:   Refer to the procedure report that was given to you for any specific questions about what was found during the examination.  If the procedure report does not answer your questions, please call your gastroenterologist to clarify.  If you requested that your care partner not be given the details of your procedure findings, then the procedure report has been included in a sealed envelope for you to review at your convenience later. ? ?YOU SHOULD EXPECT: Some feelings of bloating in the abdomen. Passage of more gas than usual.  Walking can help get rid of the air that was put into your GI tract during the procedure and reduce the bloating. If you had a lower endoscopy (such as a colonoscopy or flexible sigmoidoscopy) you may notice spotting of blood in your stool or on the toilet paper. If you underwent a bowel prep for your procedure, you may not have a normal bowel movement for a few days. ? ?Please Note:  You might notice some irritation and congestion in your nose or some drainage.  This is from the oxygen used during your procedure.  There is no need for concern and it should clear up in a day or so. ? ?SYMPTOMS TO REPORT IMMEDIATELY: ? ?Following lower endoscopy (colonoscopy or flexible sigmoidoscopy): ? Excessive amounts of blood in the stool ? Significant tenderness or worsening of abdominal pains ? Swelling of the abdomen that is new, acute ? Fever of 100?F or higher ? ?  ?For urgent or emergent issues, a gastroenterologist can be reached at any hour by calling [phone]. ?Do not use MyChart messaging for urgent concerns.  ? ? ?DIET:  We do recommend a small meal at first, but then you may proceed to your regular diet.  Drink plenty of fluids but you should avoid alcoholic beverages for 24 hours. ? ?ACTIVITY:  You should plan to take it easy for the rest of today  and you should NOT DRIVE or use heavy machinery until tomorrow (because of the sedation medicines used during the test).   ? ?FOLLOW UP: ?Our staff will call the number listed on your records 48-72 hours following your procedure to check on you and address any questions or concerns that you may have regarding the information given to you following your procedure. If we do not reach you, we will leave a message.  We will attempt to reach you two times.  During this call, we will ask if you have developed any symptoms of COVID 19. If you develop any symptoms (ie: fever, flu-like symptoms, shortness of breath, cough etc.) before then, please call [phone].  If you test positive for Covid 19 in the 2 weeks post procedure, please call and report this information to us.   ? ?If any biopsies were taken you will be contacted by phone or by letter within the next 1-3 weeks.  Please call us at [phone] if you have not heard about the biopsies in 3 weeks.  ? ? ?SIGNATURES/CONFIDENTIALITY: ?You and/or your care partner have signed paperwork which will be entered into your electronic medical record.  These signatures attest to the fact that that the information above on your After Visit Summary has been reviewed and is understood.  Full responsibility of the confidentiality of this discharge information lies with you and/or your care-partner.  ?

## 2020-05-02 NOTE — Progress Notes (Signed)
Pt's states no medical or surgical changes since previsit or office visit. VS by CW. 

## 2020-05-04 ENCOUNTER — Telehealth: Payer: Self-pay

## 2020-05-04 NOTE — Telephone Encounter (Signed)
  Follow up Call-  Call back number 05/02/2020  Post procedure Call Back phone  # [phone]  Permission to leave phone message Yes  Some recent data might be hidden     Patient questions:  Do you have a fever, pain , or abdominal swelling? No. Pain Score  0 *  Have you tolerated food without any problems? Yes.    Have you been able to return to your normal activities? Yes.    Do you have any questions about your discharge instructions: Diet   No. Medications  No. Follow up visit  No.  Do you have questions or concerns about your Care? No.  Actions: * If pain score is 4 or above: No action needed, pain <4.  1. Have you developed a fever since your procedure? no  2.   Have you had an respiratory symptoms (SOB or cough) since your procedure? no  3.   Have you tested positive for COVID 19 since your procedure no  4.   Have you had any family members/close contacts diagnosed with the COVID 19 since your procedure?  no   If yes to any of these questions please route to Joylene John, RN and Joella Prince, RN

## 2020-05-15 ENCOUNTER — Encounter: Payer: Self-pay | Admitting: Gastroenterology

## 2020-05-19 ENCOUNTER — Telehealth: Payer: Self-pay | Admitting: Internal Medicine

## 2020-05-19 DIAGNOSIS — J449 Chronic obstructive pulmonary disease, unspecified: Secondary | ICD-10-CM

## 2020-05-19 DIAGNOSIS — G4730 Sleep apnea, unspecified: Secondary | ICD-10-CM

## 2020-05-19 NOTE — Telephone Encounter (Signed)
[name] from [facility] called  The last order sent over for the patient was for a CPAP, but after talking to the patient they found out he uses a BiPAP instead.   They need a new written order for the BiPAP with settings instead that included the diagnosis and pressure measurements.   Please fax the completed order to [phone]

## 2020-05-22 NOTE — Telephone Encounter (Signed)
Okay BiPAP.  Thanks

## 2020-05-26 NOTE — Telephone Encounter (Signed)
IPAP = 12 cmH20  Thx

## 2020-05-26 NOTE — Telephone Encounter (Signed)
Ordered BIPAP.. faxed to [facility] [phone].Marland KitchenJohny Chandler

## 2020-06-05 ENCOUNTER — Other Ambulatory Visit: Payer: Self-pay

## 2020-06-06 ENCOUNTER — Ambulatory Visit (INDEPENDENT_AMBULATORY_CARE_PROVIDER_SITE_OTHER): Payer: 59 | Admitting: Internal Medicine

## 2020-06-06 ENCOUNTER — Encounter: Payer: Self-pay | Admitting: Internal Medicine

## 2020-06-06 DIAGNOSIS — R918 Other nonspecific abnormal finding of lung field: Secondary | ICD-10-CM

## 2020-06-06 DIAGNOSIS — J449 Chronic obstructive pulmonary disease, unspecified: Secondary | ICD-10-CM | POA: Diagnosis not present

## 2020-06-06 DIAGNOSIS — M545 Low back pain, unspecified: Secondary | ICD-10-CM | POA: Diagnosis not present

## 2020-06-06 DIAGNOSIS — R911 Solitary pulmonary nodule: Secondary | ICD-10-CM

## 2020-06-06 DIAGNOSIS — G4730 Sleep apnea, unspecified: Secondary | ICD-10-CM

## 2020-06-06 DIAGNOSIS — M79604 Pain in right leg: Secondary | ICD-10-CM

## 2020-06-06 DIAGNOSIS — F419 Anxiety disorder, unspecified: Secondary | ICD-10-CM

## 2020-06-06 MED ORDER — ISOSORBIDE MONONITRATE ER 30 MG PO TB24
30.0000 mg | ORAL_TABLET | Freq: Every day | ORAL | 3 refills | Status: DC
Start: 2020-06-06 — End: 2020-08-14

## 2020-06-06 MED ORDER — DICLOFENAC SODIUM 75 MG PO TBEC: 75.0000 mg | DELAYED_RELEASE_TABLET | Freq: Two times a day (BID) | ORAL | 5 refills | Status: DC | PRN

## 2020-06-06 MED ORDER — ALPRAZOLAM 1 MG PO TABS: ORAL_TABLET | ORAL | 3 refills | Status: DC

## 2020-06-06 NOTE — Progress Notes (Signed)
Subjective:  Patient ID: [name], male    DOB: Oct 05, 1960  Age: 60 y.o. MRN: [id number]  CC: Follow-up (3 month f/u)   HPI Erek Kowal presents for pulm nodule, COPD, OSA f/u  Outpatient Medications Prior to Visit  Medication Sig Dispense Refill  . ALPRAZolam (XANAX) 1 MG tablet TAKE 1/2 TO 1 TABLET(0.5 TO 1 MG) BY MOUTH TWICE DAILY AS NEEDED FOR ANXIETY 60 tablet 3  . aspirin EC 81 MG tablet Take 1 tablet (81 mg total) by mouth daily.    . budesonide-formoterol (SYMBICORT) 160-4.5 MCG/ACT inhaler Inhale 2 puffs into the lungs 2 (two) times daily. 10.2 g 11  . diclofenac (VOLTAREN) 75 MG EC tablet Take 1 tablet (75 mg total) by mouth 2 (two) times daily as needed for moderate pain (pc). 60 tablet 5  . isosorbide mononitrate (IMDUR) 30 MG 24 hr tablet Take 1 tablet (30 mg total) by mouth daily. 90 tablet 3  . nitroGLYCERIN (NITROSTAT) 0.4 MG SL tablet Place 1 tablet (0.4 mg total) under the tongue every 5 (five) minutes as needed. 20 tablet 3  . atorvastatin (LIPITOR) 20 MG tablet Take 1 tablet (20 mg total) by mouth daily. (Patient not taking: No sig reported) 90 tablet 3   No facility-administered medications prior to visit.    ROS: Review of Systems  Constitutional: Negative for appetite change, fatigue and unexpected weight change.  HENT: Negative for congestion, nosebleeds, sneezing, sore throat and trouble swallowing.   Eyes: Negative for itching and visual disturbance.  Respiratory: Negative for cough.   Cardiovascular: Negative for chest pain, palpitations and leg swelling.  Gastrointestinal: Negative for abdominal distention, blood in stool, diarrhea and nausea.  Genitourinary: Negative for frequency and hematuria.  Musculoskeletal: Negative for back pain, gait problem, joint swelling and neck pain.  Skin: Negative for rash.  Neurological: Negative for dizziness, tremors, speech difficulty and weakness.  Psychiatric/Behavioral: Negative for  agitation, dysphoric mood, sleep disturbance and suicidal ideas. The patient is not nervous/anxious.     Objective:  BP (!) 152/82 (BP Location: Left Arm)   Pulse 96   Temp 97.7 F (36.5 C) (Oral)   Wt 292 lb 9.6 oz (132.7 kg)   SpO2 97%   BMI 39.68 kg/m   BP Readings from Last 3 Encounters:  06/06/20 (!) 152/82  05/02/20 123/74  03/07/20 (!) 150/88    Wt Readings from Last 3 Encounters:  06/06/20 292 lb 9.6 oz (132.7 kg)  05/02/20 293 lb (132.9 kg)  04/20/20 293 lb (132.9 kg)    Physical Exam Constitutional:      General: He is not in acute distress.    Appearance: He is well-developed.     Comments: NAD  HENT:     Mouth/Throat:     Mouth: Oropharynx is clear and moist.  Eyes:     Conjunctiva/sclera: Conjunctivae normal.     Pupils: Pupils are equal, round, and reactive to light.  Neck:     Thyroid: No thyromegaly.     Vascular: No JVD.  Cardiovascular:     Rate and Rhythm: Normal rate and regular rhythm.     Pulses: Intact distal pulses.     Heart sounds: Normal heart sounds. No murmur heard. No friction rub. No gallop.   Pulmonary:     Effort: Pulmonary effort is normal. No respiratory distress.     Breath sounds: Normal breath sounds. No wheezing or rales.  Chest:     Chest wall: No tenderness.  Abdominal:  General: Bowel sounds are normal. There is no distension.     Palpations: Abdomen is soft. There is no mass.     Tenderness: There is no abdominal tenderness. There is no guarding or rebound.  Musculoskeletal:        General: No tenderness or edema. Normal range of motion.     Cervical back: Normal range of motion.  Lymphadenopathy:     Cervical: No cervical adenopathy.  Skin:    General: Skin is warm and dry.     Findings: No rash.  Neurological:     Mental Status: He is alert and oriented to person, place, and time.     Cranial Nerves: No cranial nerve deficit.     Motor: No abnormal muscle tone.     Coordination: He displays a negative  Romberg sign. Coordination normal.     Gait: Gait normal.     Deep Tendon Reflexes: Reflexes are normal and symmetric.  Psychiatric:        Mood and Affect: Mood and affect normal.        Behavior: Behavior normal.        Thought Content: Thought content normal.        Judgment: Judgment normal.     Lab Results  Component Value Date   WBC 6.4 12/29/2017   HGB 15.1 12/29/2017   HCT 44.2 12/29/2017   PLT 160.0 12/29/2017   GLUCOSE 149 (H) 12/29/2017   CHOL 113 12/29/2017   TRIG 335.0 (H) 12/29/2017   HDL 31.80 (L) 12/29/2017   LDLDIRECT 38.0 12/29/2017   LDLCALC 15 10/18/2013   ALT 37 12/29/2017   AST 18 12/29/2017   NA 141 12/29/2017   K 4.2 12/29/2017   CL 106 12/29/2017   CREATININE 0.90 12/29/2017   BUN 13 12/29/2017   CO2 26 12/29/2017   TSH 1.88 12/29/2017   PSA 0.32 12/29/2017   INR 0.9 05/22/2016   HGBA1C 5.8 01/18/2015    CT Chest Wo Contrast  Result Date: 03/30/2020 CLINICAL DATA:  COPD exacerbation, shortness of breath on exertion. EXAM: CT CHEST WITHOUT CONTRAST TECHNIQUE: Multidetector CT imaging of the chest was performed following the standard protocol without IV contrast. COMPARISON:  05/07/2011. FINDINGS: Cardiovascular: Atherosclerotic calcification of the aorta, aortic valve and coronary arteries. Heart is at the upper limits of normal in size to mildly enlarged. No pericardial effusion. Mediastinum/Nodes: No pathologically enlarged mediastinal or axillary lymph nodes. Hilar regions are difficult to evaluate without IV contrast. Esophagus is grossly unremarkable. Lungs/Pleura: Mild paraseptal emphysema. Smoking related respiratory bronchiolitis. Image quality in the lung bases is degraded by respiratory motion. 5 mm perifissural nodule in the superior segment left lower lobe (3/44), new. No pleural fluid. Airway is unremarkable. Upper Abdomen: 1.7 cm low-attenuation lesion in the inferior right hepatic lobe is incompletely visualized. Visualized portions of  the liver, gallbladder, adrenal glands, left kidney, spleen, pancreas, stomach and bowel are otherwise unremarkable. No upper abdominal adenopathy. Musculoskeletal: Degenerative changes in the spine. No worrisome lytic or sclerotic lesions. Mild compression deformities in the midthoracic spine are unchanged. IMPRESSION: 1. No pulmonary parenchymal findings to explain the patient's shortness of breath. 2. 5 mm left lower lobe nodule. Follow-up CT chest without contrast in 1 year is recommended. This recommendation follows the consensus statement: Guidelines for Management of Small Pulmonary Nodules Detected on CT Images: From the Fleischner Society 2017; Radiology 2017; 284:228-243. 3. Aortic atherosclerosis (ICD10-I70.0). Coronary artery calcification. 4.  Emphysema (ICD10-J43.9). Electronically Signed   By: Lorin Picket  M.D.   On: 03/30/2020 08:04    Assessment & Plan:   There are no diagnoses linked to this encounter.   No orders of the defined types were placed in this encounter.    Follow-up: No follow-ups on file.  Walker Kehr, MD

## 2020-06-06 NOTE — Assessment & Plan Note (Signed)
No relapse 

## 2020-06-06 NOTE — Assessment & Plan Note (Signed)
On BiPAP mask

## 2020-06-06 NOTE — Assessment & Plan Note (Signed)
CT w/5 mm L lung nodule Repeat CT in 12 mo (11/22)

## 2020-06-06 NOTE — Assessment & Plan Note (Signed)
CT w/5 mm L lung nodule, emphesema Repeat CT in 12 mo (11/22) Symbicort

## 2020-06-06 NOTE — Assessment & Plan Note (Signed)
Alprazolam prn  Potential benefits of a long term benzodiazepines  use as well as potential risks  and complications were explained to the patient and were aknowledged. 

## 2020-06-06 NOTE — Patient Instructions (Signed)
For a mild COVID-19 case you can take zinc 50 mg a day for 1 week, vitamin C 1000 mg daily for 1 week, vitamin D2 50,000 units weekly for 2 months (unless you are taking vitamin D daily already), Quercetin 500 mg twice a day for 1 week (if you can get it quick enough).  Maintain good oral hydration and take Tylenol with high fever.   You can buy COVID-19 express tests for home use. They are inexpensive, roughly $15 for 2 tests.  There is a link below: [email or web address]-test-kit.[email or web address]  You can order 4 free COVID-19 home tests via [employer]  

## 2020-07-18 ENCOUNTER — Telehealth: Payer: Self-pay | Admitting: *Deleted

## 2020-07-18 NOTE — Telephone Encounter (Signed)
IPAP 10 EPAP 5  Pls tell the pt that [facility] needs a FTF visit to document the need and compliance. If he wants,  we can have him see a sleep specialist (Pulmonary) for a consultation instead of seeing me.   Thx

## 2020-07-18 NOTE — Telephone Encounter (Signed)
We have been working on an order for a replacement bipap for this patient however the most recent order received just has one setting on it which is an IPAP setting. We will also need the EPAP setting added to the order.   We will also need a face to face office visit note discussing using and benefiting from PAP therapy as well as compliance.    Please let us know if you have any questions or need assistance. Also, please respond to all so we don't miss your reply.   Thank you,  [name]  [facility]

## 2020-07-25 NOTE — Telephone Encounter (Signed)
Called pt gave him MD response. Pt states he really don't wan to see a specialist. Made appt to see Dr. Camila Li 08/14/20.Marland KitchenJohny Chess

## 2020-08-14 ENCOUNTER — Ambulatory Visit (INDEPENDENT_AMBULATORY_CARE_PROVIDER_SITE_OTHER): Payer: 59 | Admitting: Internal Medicine

## 2020-08-14 ENCOUNTER — Other Ambulatory Visit: Payer: Self-pay

## 2020-08-14 ENCOUNTER — Encounter: Payer: Self-pay | Admitting: Internal Medicine

## 2020-08-14 DIAGNOSIS — M5416 Radiculopathy, lumbar region: Secondary | ICD-10-CM

## 2020-08-14 DIAGNOSIS — R569 Unspecified convulsions: Secondary | ICD-10-CM | POA: Diagnosis not present

## 2020-08-14 DIAGNOSIS — M545 Low back pain, unspecified: Secondary | ICD-10-CM

## 2020-08-14 DIAGNOSIS — F419 Anxiety disorder, unspecified: Secondary | ICD-10-CM

## 2020-08-14 DIAGNOSIS — G4733 Obstructive sleep apnea (adult) (pediatric): Secondary | ICD-10-CM | POA: Diagnosis not present

## 2020-08-14 DIAGNOSIS — M79605 Pain in left leg: Secondary | ICD-10-CM

## 2020-08-14 MED ORDER — DICLOFENAC SODIUM 75 MG PO TBEC: 75.0000 mg | DELAYED_RELEASE_TABLET | Freq: Two times a day (BID) | ORAL | 5 refills | Status: DC | PRN

## 2020-08-14 MED ORDER — ALPRAZOLAM 1 MG PO TABS: ORAL_TABLET | ORAL | 3 refills | Status: DC

## 2020-08-14 MED ORDER — ISOSORBIDE MONONITRATE ER 30 MG PO TB24
30.0000 mg | ORAL_TABLET | Freq: Every day | ORAL | 3 refills | Status: DC
Start: 2020-08-14 — End: 2020-11-17

## 2020-08-14 NOTE — Assessment & Plan Note (Signed)
Diclofenac po prn  Potential benefits of a long term NSAIDs use as well as potential risks  and complications were explained to the patient and were aknowledged.

## 2020-08-14 NOTE — Telephone Encounter (Signed)
error 

## 2020-08-14 NOTE — Assessment & Plan Note (Signed)
Diclofenac as needed  Potential benefits of a long term NSAID  use as well as potential risks  and complications were explained to the patient and were aknowledged.

## 2020-08-14 NOTE — Assessment & Plan Note (Signed)
Wt Readings from Last 3 Encounters:  08/14/20 294 lb (133.4 kg)  06/06/20 292 lb 9.6 oz (132.7 kg)  05/02/20 293 lb (132.9 kg)  [name] is more active now, hopefully he will lose weight

## 2020-08-14 NOTE — Assessment & Plan Note (Signed)
No relapse 

## 2020-08-14 NOTE — Assessment & Plan Note (Signed)
Alprazolam prn  Potential benefits of a long term benzodiazepines  use as well as potential risks  and complications were explained to the patient and were aknowledged. 

## 2020-08-14 NOTE — Assessment & Plan Note (Addendum)
on BiPap.  But it has been very compliant with his BiPAP use every night.  He cannot sleep without it.  It makes him feel better in the morning.  iPAP 16 ePAP 12

## 2020-08-14 NOTE — Progress Notes (Signed)
Subjective:  Patient ID: [name], male    DOB: September 02, 1960  Age: 60 y.o. MRN: [id number]  CC: Follow-up (3 MONTH F/U- Need to discuss BIPAP)   HPI [name] presents for OSA - on BiPap.  But it has been very compliant with his BiPAP use every night.  He cannot sleep without it.  It makes him feel better in the morning.  iPAP 16 ePAP 12  Follow-up on anxiety, dyslipidemia, arthritis  Outpatient Medications Prior to Visit  Medication Sig Dispense Refill  . ALPRAZolam (XANAX) 1 MG tablet 1 po bid prn 60 tablet 3  . aspirin EC 81 MG tablet Take 1 tablet (81 mg total) by mouth daily.    . budesonide-formoterol (SYMBICORT) 160-4.5 MCG/ACT inhaler Inhale 2 puffs into the lungs 2 (two) times daily. 10.2 g 11  . diclofenac (VOLTAREN) 75 MG EC tablet Take 1 tablet (75 mg total) by mouth 2 (two) times daily as needed for moderate pain (pc). 60 tablet 5  . isosorbide mononitrate (IMDUR) 30 MG 24 hr tablet Take 1 tablet (30 mg total) by mouth daily. 90 tablet 3  . nitroGLYCERIN (NITROSTAT) 0.4 MG SL tablet Place 1 tablet (0.4 mg total) under the tongue every 5 (five) minutes as needed. 20 tablet 3  . atorvastatin (LIPITOR) 20 MG tablet Take 1 tablet (20 mg total) by mouth daily. (Patient not taking: No sig reported) 90 tablet 3   No facility-administered medications prior to visit.    ROS: Review of Systems  Constitutional: Negative for appetite change, fatigue and unexpected weight change.  HENT: Negative for congestion, nosebleeds, sneezing, sore throat and trouble swallowing.   Eyes: Negative for itching and visual disturbance.  Respiratory: Negative for cough.   Cardiovascular: Negative for chest pain, palpitations and leg swelling.  Gastrointestinal: Negative for abdominal distention, blood in stool, diarrhea and nausea.  Genitourinary: Negative for frequency and hematuria.  Musculoskeletal: Positive for back pain. Negative for gait problem, joint  swelling and neck pain.  Skin: Negative for rash.  Neurological: Negative for dizziness, tremors, speech difficulty and weakness.  Psychiatric/Behavioral: Negative for agitation, dysphoric mood and sleep disturbance. The patient is nervous/anxious.     Objective:  BP (!) 142/88 (BP Location: Left Arm)   Pulse 73   Temp 98.1 F (36.7 C) (Oral)   Ht 6' (1.829 m)   Wt 294 lb (133.4 kg)   SpO2 95%   BMI 39.87 kg/m   BP Readings from Last 3 Encounters:  08/14/20 (!) 142/88  06/06/20 (!) 152/82  05/02/20 123/74    Wt Readings from Last 3 Encounters:  08/14/20 294 lb (133.4 kg)  06/06/20 292 lb 9.6 oz (132.7 kg)  05/02/20 293 lb (132.9 kg)    Physical Exam Constitutional:      General: He is not in acute distress.    Appearance: He is well-developed. He is obese.     Comments: NAD  Eyes:     Conjunctiva/sclera: Conjunctivae normal.     Pupils: Pupils are equal, round, and reactive to light.  Neck:     Thyroid: No thyromegaly.     Vascular: No JVD.  Cardiovascular:     Rate and Rhythm: Normal rate and regular rhythm.     Heart sounds: Normal heart sounds. No murmur heard. No friction rub. No gallop.   Pulmonary:     Effort: Pulmonary effort is normal. No respiratory distress.     Breath sounds: Normal breath sounds. No wheezing or rales.  Chest:     Chest wall: No tenderness.  Abdominal:     General: Bowel sounds are normal. There is no distension.     Palpations: Abdomen is soft. There is no mass.     Tenderness: There is no abdominal tenderness. There is no guarding or rebound.  Musculoskeletal:        General: Tenderness present. Normal range of motion.     Cervical back: Normal range of motion.  Lymphadenopathy:     Cervical: No cervical adenopathy.  Skin:    General: Skin is warm and dry.     Findings: No rash.  Neurological:     Mental Status: He is alert and oriented to person, place, and time.     Cranial Nerves: No cranial nerve deficit.     Motor: No  abnormal muscle tone.     Coordination: Coordination normal.     Gait: Gait normal.     Deep Tendon Reflexes: Reflexes are normal and symmetric.  Psychiatric:        Behavior: Behavior normal.        Thought Content: Thought content normal.        Judgment: Judgment normal.     Lab Results  Component Value Date   WBC 6.4 12/29/2017   HGB 15.1 12/29/2017   HCT 44.2 12/29/2017   PLT 160.0 12/29/2017   GLUCOSE 149 (H) 12/29/2017   CHOL 113 12/29/2017   TRIG 335.0 (H) 12/29/2017   HDL 31.80 (L) 12/29/2017   LDLDIRECT 38.0 12/29/2017   LDLCALC 15 10/18/2013   ALT 37 12/29/2017   AST 18 12/29/2017   NA 141 12/29/2017   K 4.2 12/29/2017   CL 106 12/29/2017   CREATININE 0.90 12/29/2017   BUN 13 12/29/2017   CO2 26 12/29/2017   TSH 1.88 12/29/2017   PSA 0.32 12/29/2017   INR 0.9 05/22/2016   HGBA1C 5.8 01/18/2015    CT Chest Wo Contrast  Result Date: 03/30/2020 CLINICAL DATA:  COPD exacerbation, shortness of breath on exertion. EXAM: CT CHEST WITHOUT CONTRAST TECHNIQUE: Multidetector CT imaging of the chest was performed following the standard protocol without IV contrast. COMPARISON:  05/07/2011. FINDINGS: Cardiovascular: Atherosclerotic calcification of the aorta, aortic valve and coronary arteries. Heart is at the upper limits of normal in size to mildly enlarged. No pericardial effusion. Mediastinum/Nodes: No pathologically enlarged mediastinal or axillary lymph nodes. Hilar regions are difficult to evaluate without IV contrast. Esophagus is grossly unremarkable. Lungs/Pleura: Mild paraseptal emphysema. Smoking related respiratory bronchiolitis. Image quality in the lung bases is degraded by respiratory motion. 5 mm perifissural nodule in the superior segment left lower lobe (3/44), new. No pleural fluid. Airway is unremarkable. Upper Abdomen: 1.7 cm low-attenuation lesion in the inferior right hepatic lobe is incompletely visualized. Visualized portions of the liver, gallbladder,  adrenal glands, left kidney, spleen, pancreas, stomach and bowel are otherwise unremarkable. No upper abdominal adenopathy. Musculoskeletal: Degenerative changes in the spine. No worrisome lytic or sclerotic lesions. Mild compression deformities in the midthoracic spine are unchanged. IMPRESSION: 1. No pulmonary parenchymal findings to explain the patient's shortness of breath. 2. 5 mm left lower lobe nodule. Follow-up CT chest without contrast in 1 year is recommended. This recommendation follows the consensus statement: Guidelines for Management of Small Pulmonary Nodules Detected on CT Images: From the Fleischner Society 2017; Radiology 2017; 284:228-243. 3. Aortic atherosclerosis (ICD10-I70.0). Coronary artery calcification. 4.  Emphysema (ICD10-J43.9). Electronically Signed   By: Lorin Picket M.D.   On: 03/30/2020 08:04  Assessment & Plan:     Follow-up: No follow-ups on file.  Walker Kehr, MD

## 2020-08-16 ENCOUNTER — Telehealth: Payer: Self-pay | Admitting: Internal Medicine

## 2020-08-16 NOTE — Telephone Encounter (Signed)
   [facility] requesting clinical information for CPAP machine/supplies Authorization pending Please fax clinical to [id number] Attn: Josem Kaufmann [phone]  Phone [phone]

## 2020-08-16 NOTE — Telephone Encounter (Signed)
Already faxed last ov.. will refaxed to the # given below.Marland KitchenJohny Chess

## 2020-09-06 ENCOUNTER — Ambulatory Visit: Payer: 59 | Admitting: Internal Medicine

## 2020-09-26 ENCOUNTER — Other Ambulatory Visit: Payer: Self-pay | Admitting: Internal Medicine

## 2020-09-26 DIAGNOSIS — D485 Neoplasm of uncertain behavior of skin: Secondary | ICD-10-CM

## 2020-10-04 ENCOUNTER — Encounter (HOSPITAL_BASED_OUTPATIENT_CLINIC_OR_DEPARTMENT_OTHER): Payer: Self-pay | Admitting: *Deleted

## 2020-10-04 ENCOUNTER — Other Ambulatory Visit: Payer: Self-pay

## 2020-10-04 ENCOUNTER — Emergency Department (HOSPITAL_BASED_OUTPATIENT_CLINIC_OR_DEPARTMENT_OTHER)
Admission: EM | Admit: 2020-10-04 | Discharge: 2020-10-04 | Disposition: A | Discharge status: Home / Self Care | Payer: 59 | Attending: Emergency Medicine | Admitting: Emergency Medicine

## 2020-10-04 DIAGNOSIS — Z85828 Personal history of other malignant neoplasm of skin: Secondary | ICD-10-CM | POA: Insufficient documentation

## 2020-10-04 DIAGNOSIS — J449 Chronic obstructive pulmonary disease, unspecified: Secondary | ICD-10-CM | POA: Insufficient documentation

## 2020-10-04 DIAGNOSIS — E119 Type 2 diabetes mellitus without complications: Secondary | ICD-10-CM | POA: Insufficient documentation

## 2020-10-04 DIAGNOSIS — R21 Rash and other nonspecific skin eruption: Secondary | ICD-10-CM | POA: Diagnosis present

## 2020-10-04 DIAGNOSIS — F17210 Nicotine dependence, cigarettes, uncomplicated: Secondary | ICD-10-CM | POA: Diagnosis not present

## 2020-10-04 DIAGNOSIS — L03116 Cellulitis of left lower limb: Secondary | ICD-10-CM | POA: Diagnosis not present

## 2020-10-04 DIAGNOSIS — Z7951 Long term (current) use of inhaled steroids: Secondary | ICD-10-CM | POA: Insufficient documentation

## 2020-10-04 DIAGNOSIS — I2510 Atherosclerotic heart disease of native coronary artery without angina pectoris: Secondary | ICD-10-CM | POA: Diagnosis not present

## 2020-10-04 DIAGNOSIS — I10 Essential (primary) hypertension: Secondary | ICD-10-CM | POA: Diagnosis not present

## 2020-10-04 DIAGNOSIS — Z79899 Other long term (current) drug therapy: Secondary | ICD-10-CM | POA: Insufficient documentation

## 2020-10-04 DIAGNOSIS — Z7982 Long term (current) use of aspirin: Secondary | ICD-10-CM | POA: Insufficient documentation

## 2020-10-04 MED ORDER — SULFAMETHOXAZOLE-TRIMETHOPRIM 800-160 MG PO TABS: 1.0000 | ORAL_TABLET | Freq: Two times a day (BID) | ORAL | 0 refills | Status: AC
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
## 2020-10-04 NOTE — ED Triage Notes (Signed)
C/o ? Insect bite to left foot with redness x 3 days

## 2020-10-04 NOTE — Discharge Instructions (Signed)
You came to the emergency department today to be evaluated for a area of concern on your left foot.  Based on physical exam there is a concern that you have a skin infection.  Because of this I have started you on antibiotics.  Please take 1 pill twice a day for the next 7 days.  Please follow-up with your primary care provider in 3 days for reevaluation.    You may have diarrhea from the antibiotics.  It is very important that you continue to take the antibiotics even if you get diarrhea unless a medical professional tells you that you may stop taking them.  If you stop too early the bacteria you are being treated for will become stronger and you may need different, more powerful antibiotics that have more side effects and worsening diarrhea.  Please stay well hydrated and consider probiotics as they may decrease the severity of your diarrhea.    Get help right away if: Your symptoms get worse. You feel very sleepy. You develop vomiting or diarrhea that persists. You notice red streaks coming from the infected area. Your red area gets larger or turns dark in color.

## 2020-11-17 ENCOUNTER — Telehealth (INDEPENDENT_AMBULATORY_CARE_PROVIDER_SITE_OTHER): Payer: 59 | Admitting: Internal Medicine

## 2020-11-17 ENCOUNTER — Telehealth: Payer: Self-pay | Admitting: Internal Medicine

## 2020-11-17 DIAGNOSIS — M545 Low back pain, unspecified: Secondary | ICD-10-CM | POA: Diagnosis not present

## 2020-11-17 DIAGNOSIS — M79605 Pain in left leg: Secondary | ICD-10-CM

## 2020-11-17 DIAGNOSIS — F419 Anxiety disorder, unspecified: Secondary | ICD-10-CM

## 2020-11-17 DIAGNOSIS — U071 COVID-19: Secondary | ICD-10-CM | POA: Insufficient documentation

## 2020-11-17 DIAGNOSIS — I25119 Atherosclerotic heart disease of native coronary artery with unspecified angina pectoris: Secondary | ICD-10-CM

## 2020-11-17 HISTORY — DX: COVID-19: U07.1

## 2020-11-17 MED ORDER — ISOSORBIDE MONONITRATE ER 30 MG PO TB24
30.0000 mg | ORAL_TABLET | Freq: Every day | ORAL | 3 refills | Status: DC
Start: 2020-11-17 — End: 2021-07-26

## 2020-11-17 MED ORDER — PAXLOVID 20 X 150 MG & 10 X 100MG PO TBPK: 3.0000 | ORAL_TABLET | Freq: Two times a day (BID) | ORAL | 0 refills | Status: DC

## 2020-11-17 MED ORDER — ALPRAZOLAM 1 MG PO TABS: ORAL_TABLET | ORAL | 3 refills | Status: DC

## 2020-11-17 MED ORDER — DICLOFENAC SODIUM 75 MG PO TBEC: 75.0000 mg | DELAYED_RELEASE_TABLET | Freq: Two times a day (BID) | ORAL | 5 refills | Status: DC | PRN

## 2020-11-17 NOTE — Telephone Encounter (Signed)
Called pt he states his wife has also tested positive, and wanting to see if MD could send in [place]. Inform pt ill send msg under wife chart, and give him call back w/his response.Marland KitchenJohny Chess

## 2020-11-17 NOTE — Assessment & Plan Note (Addendum)
Cont w/Isosorbide, ASA.

## 2020-11-17 NOTE — Assessment & Plan Note (Signed)
Cont w/Alprazolam prn  Potential benefits of a long term benzodiazepines  use as well as potential risks  and complications were explained to the patient and were aknowledged.

## 2020-11-17 NOTE — Assessment & Plan Note (Signed)
Cont w/Diclofenac po prn  Potential benefits of a long term NSAIDs use as well as potential risks  and complications were explained to the patient and were aknowledged.

## 2020-11-17 NOTE — Assessment & Plan Note (Addendum)
We prescribed Paxlovid.  Isolation discussed

## 2020-11-17 NOTE — Telephone Encounter (Signed)
° ° °  Please return call to patient °

## 2020-11-17 NOTE — Progress Notes (Signed)
Virtual Visit via Telephone Note  I connected with [name] on 11/17/20 at  8:10 AM EDT by telephone and verified that I am speaking with the correct person using two identifiers.  Location: Patient: Home Provider: [facility] office No one else was present in the call  I discussed the limitations, risks, security and privacy concerns of performing an evaluation and management service by telephone and the availability of in person appointments. I also discussed with the patient that there may be a patient responsible charge related to this service. The patient expressed understanding and agreed to proceed.   History of Present Illness: The patient is complaining of several days of arthralgia, fatigue, cough, malaise.  He has been feeling worse. Follow-up on anxiety, low back pain, coronary disease Observations/Objective: Patient sounds hoarse on the phone  Assessment and Plan: See plan  Follow Up Instructions:    I discussed the assessment and treatment plan with the patient. The patient was provided an opportunity to ask questions and all were answered. The patient agreed with the plan and demonstrated an understanding of the instructions.   The patient was advised to call back or seek an in-person evaluation if the symptoms worsen or if the condition fails to improve as anticipated.  I provided 21 minutes of non-face-to-face time during this encounter.   Walker Kehr, MD

## 2020-11-17 NOTE — Telephone Encounter (Signed)
  Patient is requesting a call back in regards to visit from today. Patient would not specify the reason.

## 2020-11-19 ENCOUNTER — Encounter: Payer: Self-pay | Admitting: Internal Medicine

## 2020-11-20 ENCOUNTER — Ambulatory Visit: Payer: 59 | Admitting: Internal Medicine

## 2021-01-09 ENCOUNTER — Telehealth: Payer: Self-pay

## 2021-01-09 ENCOUNTER — Encounter: Payer: Self-pay | Admitting: Internal Medicine

## 2021-01-09 ENCOUNTER — Ambulatory Visit (INDEPENDENT_AMBULATORY_CARE_PROVIDER_SITE_OTHER): Payer: 59 | Admitting: Internal Medicine

## 2021-01-09 ENCOUNTER — Other Ambulatory Visit: Payer: Self-pay

## 2021-01-09 VITALS — BP 132/80 | HR 83 | Temp 98.0°F | Ht 72.0 in | Wt 290.0 lb

## 2021-01-09 DIAGNOSIS — R0789 Other chest pain: Secondary | ICD-10-CM | POA: Diagnosis not present

## 2021-01-09 DIAGNOSIS — F419 Anxiety disorder, unspecified: Secondary | ICD-10-CM

## 2021-01-09 DIAGNOSIS — I25119 Atherosclerotic heart disease of native coronary artery with unspecified angina pectoris: Secondary | ICD-10-CM

## 2021-01-09 DIAGNOSIS — I209 Angina pectoris, unspecified: Secondary | ICD-10-CM

## 2021-01-09 DIAGNOSIS — R911 Solitary pulmonary nodule: Secondary | ICD-10-CM | POA: Diagnosis not present

## 2021-01-09 DIAGNOSIS — R079 Chest pain, unspecified: Secondary | ICD-10-CM

## 2021-01-09 DIAGNOSIS — R918 Other nonspecific abnormal finding of lung field: Secondary | ICD-10-CM

## 2021-01-09 DIAGNOSIS — U071 COVID-19: Secondary | ICD-10-CM

## 2021-01-09 DIAGNOSIS — I2510 Atherosclerotic heart disease of native coronary artery without angina pectoris: Secondary | ICD-10-CM

## 2021-01-09 MED ORDER — ASPIRIN EC 81 MG PO TBEC: 81.0000 mg | DELAYED_RELEASE_TABLET | Freq: Two times a day (BID) | ORAL | 3 refills | Status: DC

## 2021-01-09 MED ORDER — METOPROLOL TARTRATE 25 MG PO TABS
12.5000 mg | ORAL_TABLET | Freq: Two times a day (BID) | ORAL | 3 refills | Status: DC
Start: 2021-01-09 — End: 2021-07-26

## 2021-01-09 MED ORDER — NITROGLYCERIN 0.4 MG SL SUBL
0.4000 mg | SUBLINGUAL_TABLET | SUBLINGUAL | 3 refills | Status: DC | PRN
Start: 2021-01-09 — End: 2021-02-22

## 2021-01-09 NOTE — Assessment & Plan Note (Signed)
Chronic Alprazolam prn  Potential benefits of a long term benzodiazepines  use as well as potential risks  and complications were explained to the patient and were aknowledged.

## 2021-01-09 NOTE — Assessment & Plan Note (Addendum)
CP relapsed after COVID 19 Take ASA bid. Cont Isosorbide. Start Metoprolol low dose. To ER if pain relapsed NTG prn Cardiology ref - Marijo File Revonda Standard., MD

## 2021-01-09 NOTE — Telephone Encounter (Signed)
Please inform the patient and ask him if he would like [facility].  If not, where would you like to go?  Thanks

## 2021-01-09 NOTE — Assessment & Plan Note (Signed)
Better Pt lost wt 

## 2021-01-09 NOTE — Assessment & Plan Note (Signed)
Pt recovered OK Paxlovid gave rash?

## 2021-01-09 NOTE — Telephone Encounter (Signed)
A Referral was put in for [facility] office however, that office does not accept [facility].

## 2021-01-09 NOTE — Assessment & Plan Note (Signed)
Repeat CT in 12 mo (11/22)

## 2021-01-09 NOTE — Progress Notes (Signed)
Subjective:  Patient ID: [name], male    DOB: 06-17-1960  Age: 60 y.o. MRN: [id number]  CC: Follow-up (3 month f/u)   HPI [name] presents for CAD, obesity, anxiety f/u C/o CP w/exertion off and on x 1 month  Outpatient Medications Prior to Visit  Medication Sig Dispense Refill   ALPRAZolam (XANAX) 1 MG tablet 1 po bid prn 60 tablet 3   aspirin EC 81 MG tablet Take 1 tablet (81 mg total) by mouth daily.     budesonide-formoterol (SYMBICORT) 160-4.5 MCG/ACT inhaler Inhale 2 puffs into the lungs 2 (two) times daily. 10.2 g 11   diclofenac (VOLTAREN) 75 MG EC tablet Take 1 tablet (75 mg total) by mouth 2 (two) times daily as needed for moderate pain (pc). 60 tablet 5   isosorbide mononitrate (IMDUR) 30 MG 24 hr tablet Take 1 tablet (30 mg total) by mouth daily. 90 tablet 3   nitroGLYCERIN (NITROSTAT) 0.4 MG SL tablet Place 1 tablet (0.4 mg total) under the tongue every 5 (five) minutes as needed. 20 tablet 3   Nirmatrelvir & Ritonavir (PAXLOVID) 20 x 150 MG & 10 x '100MG'$  TBPK Take 3 tablets by mouth in the morning and at bedtime. As directed on a package (Patient not taking: Reported on 01/09/2021) 30 tablet 0   No facility-administered medications prior to visit.    ROS: Review of Systems  Constitutional:  Negative for appetite change, fatigue and unexpected weight change.  HENT:  Negative for congestion, nosebleeds, sneezing, sore throat and trouble swallowing.   Eyes:  Negative for itching and visual disturbance.  Respiratory:  Negative for cough.   Cardiovascular:  Negative for chest pain, palpitations and leg swelling.  Gastrointestinal:  Negative for abdominal distention, blood in stool, diarrhea and nausea.  Genitourinary:  Negative for frequency and hematuria.  Musculoskeletal:  Negative for back pain, gait problem, joint swelling and neck pain.  Skin:  Negative for rash.  Neurological:  Negative for dizziness, tremors, speech difficulty  and weakness.  Psychiatric/Behavioral:  Negative for agitation, dysphoric mood and sleep disturbance. The patient is not nervous/anxious.    Objective:  BP 132/80 (BP Location: Left Arm)   Pulse 83   Temp 98 F (36.7 C) (Oral)   Ht 6' (1.829 m)   Wt 290 lb (131.5 kg)   SpO2 96%   BMI 39.33 kg/m   BP Readings from Last 3 Encounters:  01/09/21 132/80  10/04/20 (!) 154/84  08/14/20 (!) 142/88    Wt Readings from Last 3 Encounters:  01/09/21 290 lb (131.5 kg)  10/04/20 300 lb (136.1 kg)  08/14/20 294 lb (133.4 kg)    Physical Exam Constitutional:      General: He is not in acute distress.    Appearance: He is well-developed. He is obese.     Comments: NAD  Eyes:     Conjunctiva/sclera: Conjunctivae normal.     Pupils: Pupils are equal, round, and reactive to light.  Neck:     Thyroid: No thyromegaly.     Vascular: No JVD.  Cardiovascular:     Rate and Rhythm: Normal rate and regular rhythm.     Heart sounds: Normal heart sounds. No murmur heard.   No friction rub. No gallop.  Pulmonary:     Effort: Pulmonary effort is normal. No respiratory distress.     Breath sounds: Normal breath sounds. No wheezing or rales.  Chest:     Chest wall: No tenderness.  Abdominal:  General: Bowel sounds are normal. There is no distension.     Palpations: Abdomen is soft. There is no mass.     Tenderness: There is no abdominal tenderness. There is no guarding or rebound.  Musculoskeletal:        General: No tenderness. Normal range of motion.     Cervical back: Normal range of motion.  Lymphadenopathy:     Cervical: No cervical adenopathy.  Skin:    General: Skin is warm and dry.     Findings: No rash.  Neurological:     Mental Status: He is alert and oriented to person, place, and time.     Cranial Nerves: No cranial nerve deficit.     Motor: No abnormal muscle tone.     Coordination: Coordination normal.     Gait: Gait normal.     Deep Tendon Reflexes: Reflexes are normal  and symmetric.  Psychiatric:        Behavior: Behavior normal.        Thought Content: Thought content normal.        Judgment: Judgment normal.    Lab Results  Component Value Date   WBC 6.4 12/29/2017   HGB 15.1 12/29/2017   HCT 44.2 12/29/2017   PLT 160.0 12/29/2017   GLUCOSE 149 (H) 12/29/2017   CHOL 113 12/29/2017   TRIG 335.0 (H) 12/29/2017   HDL 31.80 (L) 12/29/2017   LDLDIRECT 38.0 12/29/2017   LDLCALC 15 10/18/2013   ALT 37 12/29/2017   AST 18 12/29/2017   NA 141 12/29/2017   K 4.2 12/29/2017   CL 106 12/29/2017   CREATININE 0.90 12/29/2017   BUN 13 12/29/2017   CO2 26 12/29/2017   TSH 1.88 12/29/2017   PSA 0.32 12/29/2017   INR 0.9 05/22/2016   HGBA1C 5.8 01/18/2015    No results found.  Assessment & Plan:     Walker Kehr, MD

## 2021-01-10 NOTE — Telephone Encounter (Signed)
LDVM for pt of Dr. Judeen Hammans question a/w office number for pt to call back with his answer to going to [place] or with info on a place he would like to go to.

## 2021-02-19 DIAGNOSIS — I25110 Atherosclerotic heart disease of native coronary artery with unstable angina pectoris: Secondary | ICD-10-CM | POA: Insufficient documentation

## 2021-02-19 DIAGNOSIS — F1410 Cocaine abuse, uncomplicated: Secondary | ICD-10-CM | POA: Insufficient documentation

## 2021-02-19 DIAGNOSIS — I2510 Atherosclerotic heart disease of native coronary artery without angina pectoris: Secondary | ICD-10-CM | POA: Insufficient documentation

## 2021-02-21 NOTE — Progress Notes (Signed)
Cardiology Office Note:    Date:  02/22/2021   ID:  [name], DOB 1960/11/19, MRN [id number]  PCP:  Cassandria Anger, MD  Cardiologist:  Shirlee More, MD   Referring MD: Cassandria Anger, MD  ASSESSMENT:    1. Chest pain of uncertain etiology   2. Coronary artery disease of native artery of native heart with stable angina pectoris ([place])   3. Primary hypertension   4. Pulmonary nodule    PLAN:    In order of problems listed above:  He has known CAD nonobstructive on most recent coronary angiography and is having nonanginal chest pain.  Further evaluation cardiac CTA in the interim continue aspirin beta-blocker oral nitrate as needed nitroglycerin and he agrees to initiate statin therapy.  Will be able to utilize the information of the cardiac CTA to guide additional medical therapy or even consideration of repeat angiography and intervention if he has severe flow-limiting stenosis. Stable BP at target Will be able to check his pulmonary nodule in the cardiac CTA  Next appointment 8 weeks.   Medication Adjustments/Labs and Tests Ordered: Current medicines are reviewed at length with the patient today.  Concerns regarding medicines are outlined above.  Orders Placed This Encounter  Procedures   CT CORONARY MORPH W/CTA COR W/SCORE W/CA W/CM &/OR WO/CM   Basic metabolic panel   EKG 46-NGEX   Meds ordered this encounter  Medications   pravastatin (PRAVACHOL) 20 MG tablet    Sig: Take 1 tablet (20 mg total) by mouth every evening.    Dispense:  90 tablet    Refill:  3     Chief Complaint  Patient presents with   Chest Pain  I am concerned because of my family history  History of Present Illness:    [name] is a 60 y.o. male who is being seen today for the evaluation of chest pain at the request of Plotnikov, Evie Lacks, MD.  Chart review shows that previously cared for Dr. Marijo File in [facility].  His  diagnoses include CAD hypertension and hyper lipidemia.  There is a note that he had coronary angiography in 2017 with mild nonobstructive CAD and normal left ventricular function.  12/12/2015 cardiac catheterization encounter  Diagnostic procedure: Left Heart Cath , Selective Coronaries, Left   Ventricular Injection  The procedure was explained in detail to the patient. Risks, complications and  alternative treatments were reviewed. Written consent was obtained.  Complications: No Complications.  Conclusions  Diagnostic Procedure Summary  Mild non-obstructive coronary artery disease.  LAD 10% left circumflex 20% right coronary artery 35% Normal LV function   Recently has had chest discomfort is very fleeting momentary sharp left sternal border but occurs repeatedly without activities and makes him apprehensive about his cardiac prognosis.  Rarely he has more severe pain once or twice a year takes nitroglycerin with relief. He is not having typical exertional angina edema shortness of breath palpitation or syncope. We talked about an ischemia evaluation and advise cardiac CTA he has no dye allergy he agrees and will also be able to look at his pulmonary nodules. After discussion he agrees to lipid-lowering therapy and with his hesitancy to accept statins I will put him on a low-dose of low intensity pravastatin. Past Medical History:  Diagnosis Date   Anxiety    CAD (coronary artery disease)    Cocaine abuse (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 leg   FRACTURE SURGERY     L. arm   LEFT HEART CATHETERIZATION WITH CORONARY ANGIOGRAM N/A 11/22/2013   Procedure: LEFT HEART CATHETERIZATION WITH CORONARY ANGIOGRAM;  Surgeon: Larey Dresser, MD;  Location: [facility] CATH LAB;  Service: Cardiovascular;  Laterality: N/A;    Current Medications: Current Meds  Medication Sig   ALPRAZolam (XANAX) 1 MG tablet Take 1 mg by mouth 2 (two) times daily as needed for anxiety.   aspirin EC 81 MG tablet Take 81 mg by mouth daily. Swallow whole.   budesonide-formoterol (SYMBICORT) 160-4.5 MCG/ACT inhaler Inhale 2 puffs into the lungs 2 (two) times daily.   diclofenac (VOLTAREN) 75 MG EC tablet Take 1 tablet (75 mg total) by mouth 2 (two) times daily as needed for moderate pain (pc).   isosorbide mononitrate (IMDUR) 30 MG 24 hr tablet Take 1 tablet (30 mg total) by mouth daily.   metoprolol tartrate (LOPRESSOR) 25 MG tablet Take 0.5 tablets (12.5 mg total) by mouth 2 (two) times daily.   nitroGLYCERIN (NITROSTAT) 0.4 MG SL tablet Place 0.4 mg under the tongue every 5 (five) minutes as needed for chest pain.   pravastatin (PRAVACHOL) 20 MG tablet Take 1 tablet (20 mg total) by mouth every evening.     Allergies:   Ace inhibitors, Lisinopril, Paxlovid [nirmatrelvir-ritonavir], and Tramadol   Social History   Socioeconomic History   Marital status: Married    Spouse name: Not on file   Number of children: 2   Years of education: Not on file   Highest education level: Not on file  Occupational History   Occupation: car sales  Tobacco Use   Smoking status: Every Day    Packs/day: 2.00    Years: 25.00    Pack years: 50.00    Types: Cigarettes   Smokeless tobacco: Never  Vaping Use   Vaping Use: Never used  Substance and Sexual Activity   Alcohol use: Yes    Alcohol/week: 12.0 standard drinks    Types: 12 Cans of beer per week   Drug use: Yes    Frequency: 7.0 times per week    Types:  Marijuana    Comment: cocaine abuse yrs ago. none now.   Sexual activity: Yes  Other Topics Concern   Not on file  Social History Narrative   Not on file   Social Determinants of Health   Financial Resource Strain: Not on file  Food Insecurity: Not on file  Transportation Needs: Not on file  Physical Activity: Not on file  Stress: Not on file  Social Connections: Not on file     Family History: The patient's family history includes Atrial fibrillation in an other family member; Cancer in his mother; Heart attack in an other family member; Heart attack (age of onset: 21) in his father; Heart disease in his father.  There is no history of Colon cancer, Colon polyps, Esophageal cancer, Rectal cancer, or Stomach cancer.  ROS:   ROS Please see the history of present illness.     All other systems reviewed and are negative.  EKGs/Labs/Other Studies Reviewed:    The following studies were reviewed today:   EKG:  EKG is  ordered today.  The ekg ordered today is personally reviewed and demonstrates this rhythm nonspecific T waves otherwise normal EKG  Recent Labs: No results found for requested labs within last 8760 hours.  Recent Lipid Panel    Component Value Date/Time   CHOL 113 12/29/2017 0741   TRIG 335.0 (H) 12/29/2017 0741   HDL 31.80 (L) 12/29/2017 0741   CHOLHDL 4 12/29/2017 0741   VLDL 67.0 (H) 12/29/2017 0741   LDLCALC 15 10/18/2013 1633   LDLDIRECT 38.0 12/29/2017 0741    Physical Exam:    VS:  BP 130/80 (BP Location: Left Arm, Patient Position: Sitting, Cuff Size: Large)   Pulse 76   Ht 6' (1.829 m)   Wt 288 lb (130.6 kg)   SpO2 95%   BMI 39.06 kg/m     Wt Readings from Last 3 Encounters:  02/22/21 288 lb (130.6 kg)  01/09/21 290 lb (131.5 kg)  10/04/20 300 lb (136.1 kg)     GEN: Overweight obese BMI approaches 40 well nourished, well developed in no acute distress HEENT: Normal NECK: No JVD; No carotid bruits LYMPHATICS: No  lymphadenopathy CARDIAC: RRR, no murmurs, rubs, gallops RESPIRATORY:  Clear to auscultation without rales, wheezing or rhonchi  ABDOMEN: Soft, non-tender, non-distended MUSCULOSKELETAL:  No edema; No deformity  SKIN: Warm and dry NEUROLOGIC:  Alert and oriented x 3 PSYCHIATRIC:  Normal affect     Signed, Shirlee More, MD  02/22/2021 11:18 AM    [place]

## 2021-02-22 ENCOUNTER — Other Ambulatory Visit: Payer: Self-pay

## 2021-02-22 ENCOUNTER — Encounter: Payer: Self-pay | Admitting: Cardiology

## 2021-02-22 ENCOUNTER — Ambulatory Visit (INDEPENDENT_AMBULATORY_CARE_PROVIDER_SITE_OTHER): Payer: 59 | Admitting: Cardiology

## 2021-02-22 VITALS — BP 130/80 | HR 76 | Ht 72.0 in | Wt 288.0 lb

## 2021-02-22 DIAGNOSIS — I10 Essential (primary) hypertension: Secondary | ICD-10-CM

## 2021-02-22 DIAGNOSIS — I25118 Atherosclerotic heart disease of native coronary artery with other forms of angina pectoris: Secondary | ICD-10-CM

## 2021-02-22 DIAGNOSIS — R079 Chest pain, unspecified: Secondary | ICD-10-CM

## 2021-02-22 DIAGNOSIS — R911 Solitary pulmonary nodule: Secondary | ICD-10-CM | POA: Diagnosis not present

## 2021-02-22 MED ORDER — PRAVASTATIN SODIUM 20 MG PO TABS: 20.0000 mg | ORAL_TABLET | Freq: Every evening | ORAL | 3 refills | Status: DC

## 2021-02-22 NOTE — Patient Instructions (Signed)
Medication Instructions:  Your physician has recommended you make the following change in your medication:  START: Pravastatin 20 mg take one tablet by mouth daily.  *If you need a refill on your cardiac medications before your next appointment, please call your pharmacy*   Lab Work: Your physician recommends that you return for lab work in: Within one week of your cardiac CT  BMP  If you have labs (blood work) drawn today and your tests are completely normal, you will receive your results only by: [place] (if you have MyChart) OR A paper copy in the mail If you have any lab test that is abnormal or we need to change your treatment, we will call you to review the results.   Testing/Procedures:   Your cardiac CT will be scheduled at the below location:   [facility] [address] [place], [place] [address] [phone]  If scheduled at [facility], please arrive at the [facility] main entrance (entrance A) of [facility] 30 minutes prior to test start time. Proceed to the [facility] Radiology Department (first floor) to check-in and test prep.  Please follow these instructions carefully (unless otherwise directed):  On the Night Before the Test: Be sure to Drink plenty of water. Do not consume any caffeinated/decaffeinated beverages or chocolate 12 hours prior to your test. Do not take any antihistamines 12 hours prior to your test.  On the Day of the Test: Drink plenty of water until 1 hour prior to the test. Do not eat any food 4 hours prior to the test. You may take your regular medications prior to the test.  Take metoprolol (Lopressor) two hours prior to test.  After the Test: Drink plenty of water. After receiving IV contrast, you may experience a mild flushed feeling. This is normal. On occasion, you may experience a mild rash up to 24 hours after the test. This is not dangerous. If this occurs, you can take Benadryl 25 mg  and increase your fluid intake. If you experience trouble breathing, this can be serious. If it is severe call 911 IMMEDIATELY. If it is mild, please call our office. If you take any of these medications: Glipizide/Metformin, Avandament, Glucavance, please do not take 48 hours after completing test unless otherwise instructed.  Please allow 2-4 weeks for scheduling of routine cardiac CTs. Some insurance companies require a pre-authorization which may delay scheduling of this test.   For non-scheduling related questions, please contact the cardiac imaging nurse navigator should you have any questions/concerns: Marchia Bond, Cardiac Imaging Nurse Navigator Gordy Clement, Cardiac Imaging Nurse Navigator [facility] Direct Office Dial: [phone]   For scheduling needs, including cancellations and rescheduling, please call [place], [phone].    Follow-Up: At [facility], you and your health needs are our priority.  As part of our continuing mission to provide you with exceptional heart care, we have created designated Provider Care Teams.  These Care Teams include your primary Cardiologist (physician) and Advanced Practice Providers (APPs -  Physician Assistants and Nurse Practitioners) who all work together to provide you with the care you need, when you need it.  We recommend signing up for the patient portal called "MyChart".  Sign up information is provided on this After Visit Summary.  MyChart is used to connect with patients for Virtual Visits (Telemedicine).  Patients are able to view lab/test results, encounter notes, upcoming appointments, etc.  Non-urgent messages can be sent to your  provider as well.   To learn more about what you can do with MyChart, go to [email or web address].    Your next appointment:   8 week(s)  The format for your next appointment:   In Person  Provider:   Shirlee More, MD   Other Instructions

## 2021-02-27 ENCOUNTER — Other Ambulatory Visit (HOSPITAL_COMMUNITY): Payer: Self-pay | Admitting: Emergency Medicine

## 2021-02-27 ENCOUNTER — Telehealth (HOSPITAL_COMMUNITY): Payer: Self-pay | Admitting: Emergency Medicine

## 2021-02-27 DIAGNOSIS — R079 Chest pain, unspecified: Secondary | ICD-10-CM

## 2021-02-27 NOTE — Telephone Encounter (Signed)
Reaching out to patient to offer assistance regarding upcoming cardiac imaging study; pt verbalizes understanding of appt date/time, parking situation and where to check in, pre-test NPO status and medications ordered, and verified current allergies; name and call back number provided for further questions should they arise Nathan Bond RN Navigator Cardiac Imaging [name] [phone] office [phone] cell   Pt with history of lung nodule and needs follow up scan (CT chest w/o) linked to cardiac CTA appt. (This was reviewed with Nathan Ek, MD [place])  Pt to double metop dose 2 hr prior to scan Denies iv issues Denies claustro

## 2021-02-28 ENCOUNTER — Telehealth: Payer: Self-pay

## 2021-02-28 LAB — BASIC METABOLIC PANEL
BUN/Creatinine Ratio: 17 (ref 9–20)
BUN: 13 mg/dL (ref 6–24)
CO2: 22 mmol/L (ref 20–29)
Calcium: 9.3 mg/dL (ref 8.7–10.2)
Chloride: 103 mmol/L (ref 96–106)
Creatinine, Ser: 0.77 mg/dL (ref 0.76–1.27)
Glucose: 174 mg/dL — ABNORMAL HIGH (ref 70–99)
Potassium: 4.4 mmol/L (ref 3.5–5.2)
Sodium: 142 mmol/L (ref 134–144)
eGFR: 104 mL/min/{1.73_m2} (ref >59)

## 2021-02-28 NOTE — Telephone Encounter (Signed)
Left message on patients voicemail to please return our call.   

## 2021-02-28 NOTE — Telephone Encounter (Signed)
-----   Message from Richardo Priest, MD sent at 02/28/2021 11:07 AM EDT ----- Normal or stable result

## 2021-03-01 ENCOUNTER — Encounter (HOSPITAL_COMMUNITY): Payer: Self-pay

## 2021-03-01 ENCOUNTER — Telehealth: Payer: Self-pay

## 2021-03-01 ENCOUNTER — Ambulatory Visit (HOSPITAL_COMMUNITY)
Admission: RE | Admit: 2021-03-01 | Discharge: 2021-03-01 | Disposition: A | Discharge status: Home / Self Care | Payer: 59 | Source: Ambulatory Visit | Attending: Cardiology | Admitting: Cardiology

## 2021-03-01 ENCOUNTER — Other Ambulatory Visit: Payer: Self-pay | Admitting: Cardiovascular Disease

## 2021-03-01 ENCOUNTER — Other Ambulatory Visit: Payer: Self-pay

## 2021-03-01 DIAGNOSIS — R931 Abnormal findings on diagnostic imaging of heart and coronary circulation: Secondary | ICD-10-CM

## 2021-03-01 DIAGNOSIS — R072 Precordial pain: Secondary | ICD-10-CM

## 2021-03-01 DIAGNOSIS — R911 Solitary pulmonary nodule: Secondary | ICD-10-CM | POA: Insufficient documentation

## 2021-03-01 DIAGNOSIS — R079 Chest pain, unspecified: Secondary | ICD-10-CM | POA: Diagnosis present

## 2021-03-01 DIAGNOSIS — R918 Other nonspecific abnormal finding of lung field: Secondary | ICD-10-CM | POA: Diagnosis present

## 2021-03-01 DIAGNOSIS — I2510 Atherosclerotic heart disease of native coronary artery without angina pectoris: Secondary | ICD-10-CM | POA: Diagnosis not present

## 2021-03-01 MED ORDER — NITROGLYCERIN 0.4 MG SL SUBL: 0.8000 mg | SUBLINGUAL_TABLET | Freq: Once | SUBLINGUAL | Status: DC

## 2021-03-01 MED ORDER — METOPROLOL TARTRATE 5 MG/5ML IV SOLN
10.0000 mg | INTRAVENOUS | Status: DC | PRN
  Administered 2021-03-01: 10 mg via INTRAVENOUS

## 2021-03-01 MED ORDER — IOHEXOL 350 MG/ML SOLN
95.0000 mL | Freq: Once | INTRAVENOUS | Status: AC | PRN
  Administered 2021-03-01: 95 mL via INTRAVENOUS

## 2021-03-01 MED ORDER — METOPROLOL TARTRATE 5 MG/5ML IV SOLN
INTRAVENOUS | Status: AC
  Filled 2021-03-01: qty 20

## 2021-03-01 MED ORDER — NITROGLYCERIN 0.4 MG SL SUBL
SUBLINGUAL_TABLET | SUBLINGUAL | Status: AC
  Filled 2021-03-01: qty 2

## 2021-03-01 NOTE — Telephone Encounter (Signed)
Spoke with patient regarding results and recommendation.  Patient verbalizes understanding and is agreeable to plan of care. Advised patient to call back with any issues or concerns.  

## 2021-03-01 NOTE — Telephone Encounter (Signed)
-----   Message from Richardo Priest, MD sent at 02/28/2021 11:07 AM EDT ----- Normal or stable result

## 2021-03-01 NOTE — Progress Notes (Signed)
FFR CT ordered.   [place] T. Audie Box, MD, [place]  [address] [place], [place] [address] [phone]  11:36 AM

## 2021-03-02 ENCOUNTER — Telehealth: Payer: Self-pay

## 2021-03-02 ENCOUNTER — Encounter: Payer: Self-pay | Admitting: Internal Medicine

## 2021-03-02 DIAGNOSIS — E785 Hyperlipidemia, unspecified: Secondary | ICD-10-CM

## 2021-03-02 DIAGNOSIS — I700 Atherosclerosis of aorta: Secondary | ICD-10-CM | POA: Insufficient documentation

## 2021-03-02 NOTE — Telephone Encounter (Signed)
Spoke with patient regarding results and recommendation.  Patient verbalizes understanding and is agreeable to plan of care. Advised patient to call back with any issues or concerns.  

## 2021-03-02 NOTE — Telephone Encounter (Signed)
-----   Message from Richardo Priest, MD sent at 03/02/2021 10:37 AM EDT ----- His calcium score is elevated I is greater than 100 he needs to remain on a statin.  If he is willing I would like him to have an LP(a) level drawn as this is not incorporated to traditional lipid profile is a strong risk factor for premature and severe vascular disease Angiogram shows mild less than 50% stenosis in the arteries and low flow measurements are normal.  He does not need heart catheterization surgery or stents I can review further with him at office follow-up

## 2021-03-06 ENCOUNTER — Telehealth: Payer: Self-pay | Admitting: Internal Medicine

## 2021-03-06 MED ORDER — AZITHROMYCIN 250 MG PO TABS: ORAL_TABLET | ORAL | 0 refills | Status: DC

## 2021-03-06 NOTE — Telephone Encounter (Signed)
URI Zpac 

## 2021-03-20 LAB — LIPOPROTEIN A (LPA): Lipoprotein (a): <8.4 nmol/L (ref <75.0)

## 2021-04-12 ENCOUNTER — Other Ambulatory Visit: Payer: Self-pay | Admitting: Internal Medicine

## 2021-04-19 ENCOUNTER — Ambulatory Visit: Payer: 59 | Admitting: Cardiology

## 2021-04-25 ENCOUNTER — Ambulatory Visit (INDEPENDENT_AMBULATORY_CARE_PROVIDER_SITE_OTHER): Payer: 59 | Admitting: Internal Medicine

## 2021-04-25 ENCOUNTER — Encounter: Payer: Self-pay | Admitting: Internal Medicine

## 2021-04-25 ENCOUNTER — Other Ambulatory Visit: Payer: Self-pay

## 2021-04-25 DIAGNOSIS — E669 Obesity, unspecified: Secondary | ICD-10-CM | POA: Insufficient documentation

## 2021-04-25 DIAGNOSIS — Z23 Encounter for immunization: Secondary | ICD-10-CM | POA: Diagnosis not present

## 2021-04-25 DIAGNOSIS — F419 Anxiety disorder, unspecified: Secondary | ICD-10-CM

## 2021-04-25 DIAGNOSIS — M79605 Pain in left leg: Secondary | ICD-10-CM

## 2021-04-25 DIAGNOSIS — E1169 Type 2 diabetes mellitus with other specified complication: Secondary | ICD-10-CM | POA: Diagnosis not present

## 2021-04-25 DIAGNOSIS — R7309 Other abnormal glucose: Secondary | ICD-10-CM | POA: Diagnosis not present

## 2021-04-25 DIAGNOSIS — M545 Low back pain, unspecified: Secondary | ICD-10-CM | POA: Diagnosis not present

## 2021-04-25 MED ORDER — OZEMPIC (0.25 OR 0.5 MG/DOSE) 2 MG/1.5ML ~~LOC~~ SOPN: 0.5000 mg | PEN_INJECTOR | SUBCUTANEOUS | 2 refills | Status: DC

## 2021-04-25 NOTE — Assessment & Plan Note (Signed)
Start Ozempic q 1 week

## 2021-04-25 NOTE — Assessment & Plan Note (Signed)
Diclofenac po prn  Potential benefits of a long term NSAIDs use as well as potential risks  and complications were explained to the patient and were aknowledged.

## 2021-04-25 NOTE — Assessment & Plan Note (Signed)
Cont on Alprazolam prn  Potential benefits of a long term benzodiazepines  use as well as potential risks  and complications were explained to the patient and were aknowledged. 

## 2021-04-25 NOTE — Assessment & Plan Note (Addendum)
Worse Start Ozempic 0.25 mg q 1 week x 2 wks, then go to 0.5 mg

## 2021-04-25 NOTE — Progress Notes (Signed)
Subjective:  Patient ID: [name], male    DOB: May 07, 1961  Age: 60 y.o. MRN: [id number]  CC: Follow-up (3 month f/u- Flu shot)   HPI Drevion Offord presents for LBP, dyslipidemia, anxiety f/u F/u on new DM   Outpatient Medications Prior to Visit  Medication Sig Dispense Refill   ALPRAZolam (XANAX) 1 MG tablet Take 1 mg by mouth 2 (two) times daily as needed for anxiety.     aspirin EC 81 MG tablet Take 81 mg by mouth daily. Swallow whole.     diclofenac (VOLTAREN) 75 MG EC tablet Take 1 tablet (75 mg total) by mouth 2 (two) times daily as needed for moderate pain (pc). 60 tablet 5   isosorbide mononitrate (IMDUR) 30 MG 24 hr tablet Take 1 tablet (30 mg total) by mouth daily. 90 tablet 3   metoprolol tartrate (LOPRESSOR) 25 MG tablet Take 0.5 tablets (12.5 mg total) by mouth 2 (two) times daily. 90 tablet 3   nitroGLYCERIN (NITROSTAT) 0.4 MG SL tablet Place 0.4 mg under the tongue every 5 (five) minutes as needed for chest pain.     pravastatin (PRAVACHOL) 20 MG tablet Take 1 tablet (20 mg total) by mouth every evening. 90 tablet 3   SYMBICORT 160-4.5 MCG/ACT inhaler INHALE 2 PUFS INTO THE LUNGS TWICE A DAY 10.2 each 11   azithromycin (ZITHROMAX Z-PAK) 250 MG tablet As directed (Patient not taking: Reported on 04/25/2021) 6 tablet 0   No facility-administered medications prior to visit.    ROS: Review of Systems  Constitutional:  Negative for appetite change, fatigue and unexpected weight change.  HENT:  Negative for congestion, nosebleeds, sneezing, sore throat and trouble swallowing.   Eyes:  Negative for itching and visual disturbance.  Respiratory:  Negative for cough.   Cardiovascular:  Negative for chest pain, palpitations and leg swelling.  Gastrointestinal:  Negative for abdominal distention, blood in stool, diarrhea and nausea.  Genitourinary:  Negative for frequency and hematuria.  Musculoskeletal:  Positive for arthralgias and back pain.  Negative for gait problem, joint swelling and neck pain.  Skin:  Negative for rash.  Neurological:  Negative for dizziness, tremors, speech difficulty and weakness.  Psychiatric/Behavioral:  Negative for agitation, dysphoric mood and sleep disturbance. The patient is nervous/anxious.    Objective:  BP 140/82 (BP Location: Left Arm)    Pulse 68    Temp 97.8 F (36.6 C) (Oral)    Ht 6' (1.829 m)    Wt 287 lb (130.2 kg)    SpO2 95%    BMI 38.92 kg/m   BP Readings from Last 3 Encounters:  04/25/21 140/82  03/01/21 125/86  02/22/21 130/80    Wt Readings from Last 3 Encounters:  04/25/21 287 lb (130.2 kg)  02/22/21 288 lb (130.6 kg)  01/09/21 290 lb (131.5 kg)    Physical Exam Constitutional:      General: He is not in acute distress.    Appearance: He is well-developed. He is obese.     Comments: NAD  Eyes:     Conjunctiva/sclera: Conjunctivae normal.     Pupils: Pupils are equal, round, and reactive to light.  Neck:     Thyroid: No thyromegaly.     Vascular: No JVD.  Cardiovascular:     Rate and Rhythm: Normal rate and regular rhythm.     Heart sounds: Normal heart sounds. No murmur heard.   No friction rub. No gallop.  Pulmonary:     Effort: Pulmonary effort  is normal. No respiratory distress.     Breath sounds: Normal breath sounds. No wheezing or rales.  Chest:     Chest wall: No tenderness.  Abdominal:     General: Bowel sounds are normal. There is no distension.     Palpations: Abdomen is soft. There is no mass.     Tenderness: There is no abdominal tenderness. There is no guarding or rebound.  Musculoskeletal:        General: No tenderness. Normal range of motion.     Cervical back: Normal range of motion.  Lymphadenopathy:     Cervical: No cervical adenopathy.  Skin:    General: Skin is warm and dry.     Findings: No rash.  Neurological:     Mental Status: He is alert and oriented to person, place, and time.     Cranial Nerves: No cranial nerve deficit.      Motor: No abnormal muscle tone.     Coordination: Coordination normal.     Gait: Gait normal.     Deep Tendon Reflexes: Reflexes are normal and symmetric.  Psychiatric:        Behavior: Behavior normal.        Thought Content: Thought content normal.        Judgment: Judgment normal.    Lab Results  Component Value Date   WBC 6.4 12/29/2017   HGB 15.1 12/29/2017   HCT 44.2 12/29/2017   PLT 160.0 12/29/2017   GLUCOSE 174 (H) 02/27/2021   CHOL 113 12/29/2017   TRIG 335.0 (H) 12/29/2017   HDL 31.80 (L) 12/29/2017   LDLDIRECT 38.0 12/29/2017   LDLCALC 15 10/18/2013   ALT 37 12/29/2017   AST 18 12/29/2017   NA 142 02/27/2021   K 4.4 02/27/2021   CL 103 02/27/2021   CREATININE 0.77 02/27/2021   BUN 13 02/27/2021   CO2 22 02/27/2021   TSH 1.88 12/29/2017   PSA 0.32 12/29/2017   INR 0.9 05/22/2016   HGBA1C 5.8 01/18/2015    CT Chest Wo Contrast  Result Date: 03/01/2021 CLINICAL DATA:  Follow-up of pulmonary nodule. EXAM: CT CHEST WITHOUT CONTRAST TECHNIQUE: Multidetector CT imaging of the chest was performed following the standard protocol without IV contrast. COMPARISON:  03/29/2020 for FINDINGS: Cardiovascular: Aortic atherosclerosis. Mild cardiomegaly with lipomatous hypertrophy of the interatrial septum. Lad and right coronary artery calcification. Mediastinum/Nodes: No mediastinal or definite hilar adenopathy, given limitations of unenhanced CT. Lungs/Pleura: No pleural fluid. Mild centrilobular emphysema. A superior segment left lower lobe pulmonary nodule is similar at 5 mm on 48/4 and can be presumed benign. Vague sub solid right apical 4 mm nodule on 34/4 is not readily apparent on the prior. A solid 3 mm right apical pulmonary nodule on 27/4 is unchanged and considered benign. Minimal peribronchovascular interstitial thickening in the right lower lobe including on 87 and 88 of series 4 is new, likely a minimal infection or inflammation. Upper Abdomen: Moderate hepatic  steatosis. Normal imaged portions of the spleen, stomach, pancreas, gallbladder, kidneys. Bilateral mild adrenal thickening. Musculoskeletal: No acute osseous abnormality. IMPRESSION: 1. Bilateral pulmonary nodules, including the left lower lobe nodule of interest, are unchanged and can be considered benign. 2. Subtle right apical 5 mm ground-glass nodule does not warrant follow-up imaging, per consensus criteria. A new right lower lobe area of peribronchovascular interstitial thickening is likely infectious or inflammatory. 3.  No acute process in the chest. 4. Aortic atherosclerosis (ICD10-I70.0), coronary artery atherosclerosis and emphysema (ICD10-J43.9). 5. Hepatic steatosis  Electronically Signed   By: Abigail Miyamoto M.D.   On: 03/01/2021 12:02   CT CORONARY MORPH W/CTA COR W/SCORE W/CA W/CM &/OR WO/CM  Addendum Date: 03/01/2021   ADDENDUM REPORT: 03/01/2021 13:29 CLINICAL DATA:  Chest pain EXAM: Cardiac/Coronary CTA TECHNIQUE: A non-contrast, gated CT scan was obtained with axial slices of 3 mm through the heart for calcium scoring. Calcium scoring was performed using the Agatston method. A 120 kV prospective, gated, contrast cardiac scan was obtained. Gantry rotation speed was 250 msecs and collimation was 0.6 mm. Two sublingual nitroglycerin tablets (0.8 mg) were given. The 3D data set was reconstructed in 5% intervals of the 35-75% of the R-R cycle. Diastolic phases were analyzed on a dedicated workstation using MPR, MIP, and VRT modes. The patient received 95 cc of contrast. FINDINGS: Image quality: Excellent. Noise artifact is: Limited. Coronary Arteries:  Normal coronary origin.  Right dominance. Left main: The left main is a large caliber vessel with a normal take off from the left coronary cusp that bifurcates to form a left anterior descending artery and a left circumflex artery. There is no plaque or stenosis. Left anterior descending artery: The proximal LAD is patent. The mid LAD contains mild  mixed density plaque (25-49%). The distal LAD is patent. The first diagonal contains minimal non-calcified plaque (<25%). Left circumflex artery: The LCX is non-dominant. The proximal and mid LCX segments are patent. The distal LCX contains mild non-calcified plaque (25-49%). The LCX gives off 2 patent obtuse marginal branches. Right coronary artery: The RCA is dominant with normal take off from the right coronary cusp. The proximal RCA is ectatic and contains minimal (<25%) mixed density plaque. The mid RCA is diffusely diseased with mild mixed density plaque (25-49%). The distal RCA is ectatic with minimal mixed density plaque (<25%). The RCA terminates as a patent PDA and PLV. Right Atrium: Right atrial size is within normal limits. Right Ventricle: The right ventricular cavity is within normal limits. Left Atrium: Left atrial size is normal in size with no left atrial appendage filling defect. Left Ventricle: The ventricular cavity size is within normal limits. There are no stigmata of prior infarction. There is no abnormal filling defect. Pulmonary arteries: Normal in size without proximal filling defect. Pulmonary veins: Normal pulmonary venous drainage. Pericardium: Normal thickness with no significant effusion or calcium present. Cardiac valves: The aortic valve is trileaflet without significant calcification. The mitral valve is normal structure without significant calcification. Aorta: Normal caliber with no significant disease. Extra-cardiac findings: See attached radiology report for non-cardiac structures. IMPRESSION: 1. Coronary calcium score of 315. This was 88th percentile for age-, sex, and race-matched controls. 2. Normal coronary origin with right dominance. 3. Mild mixed density plaque (25-49%) in the mid LAD. 4. Mild non-calcified plaque (25-49%) in the distal LCX. 5. Mild mixed density plaque (25-49%) in the mid RCA. RECOMMENDATIONS: 1. Diffuse, mild non-obstructive CAD (25-49%). Consider  preventive therapy and risk factor modification. Due to diffuse CAD, additional analysis with CT FFR will be submitted. Eleonore Chiquito, MD Electronically Signed   By: Eleonore Chiquito M.D.   On: 03/01/2021 13:29   Result Date: 03/01/2021 EXAM: OVER-READ INTERPRETATION  CT CHEST The following report is an over-read performed by radiologist Dr. Abigail Miyamoto of [facility], [place] on 03/01/2021. This over-read does not include interpretation of cardiac or coronary anatomy or pathology. The coronary CTA interpretation by the cardiologist is attached. COMPARISON:  03/29/2020. Today's noncontrast chest CT is dictated separately. FINDINGS: Vascular: Aortic atherosclerosis. No  central pulmonary embolism, on this non-dedicated study. Mediastinum/Nodes: No imaged thoracic adenopathy. Lungs/Pleura: No pleural fluid. Pulmonary findings as detailed on today's chest CT dictated separately. Upper Abdomen: Hepatic steatosis. Normal imaged portions of the spleen, stomach. Musculoskeletal: Thoracic spondylosis. IMPRESSION: 1.  No acute findings in the imaged extracardiac chest. 2.  Aortic Atherosclerosis (ICD10-I70.0). 3. Hepatic steatosis Electronically Signed: By: Abigail Miyamoto M.D. On: 03/01/2021 12:05   CT CORONARY FRACTIONAL FLOW RESERVE FLUID ANALYSIS  Result Date: 03/01/2021 EXAM: CT FFR analysis was performed on the original cardiac CTA dataset. Diagrammatic representation of the CT FFR analysis is provided in a separate PDF document in PACS. This dictation was created using the PDF document and an interactive 3D model of the results. The 3D model is not available in the EMR/PACS. INTERPRETATION: CT FFR provides simultaneous calculation of pressure and flow across the entire coronary tree. For clinical decision making, CT FFR values should be obtained 1-2 cm distal to the lower border of each stenosis measured. Coronary CTA-related artifacts may impair the diagnostic accuracy of the original cardiac CTA and FFR CT  results. *Due to the fact that CT FFR represents a mathematically-derived analysis, it is recommended that the results be interpreted as follows: 1. CT FFR >0.80: Low likelihood of hemodynamic significance. 2. CT FFR 0.76-0.80: Borderline likelihood of hemodynamic significance. 3. CT FFR =< 0.75: High likelihood of hemodynamic significance. *Coronary CT Angiography-derived Fractional Flow Reserve Testing in Patients with Stable Coronary Artery Disease: Recommendations on Interpretation and Reporting. Radiology: Cardiothoracic Imaging. 2019;1(5):[id number] FINDINGS: 1. Left Main: 0.99; low likelihood of hemodynamic significance. 2. LAD: 0.91; low likelihood of hemodynamic significance. 3. LCX: 0.99; low likelihood of hemodynamic significance. 4. RCA: 0.92; low likelihood of hemodynamic significance. IMPRESSION: 1.  CT FFR analysis didn't show any significant stenosis. Eleonore Chiquito, MD Electronically Signed   By: Eleonore Chiquito M.D.   On: 03/01/2021 15:28    Assessment & Plan:   Problem List Items Addressed This Visit     Anxiety disorder    Cont on Alprazolam prn  Potential benefits of a long term benzodiazepines  use as well as potential risks  and complications were explained to the patient and were aknowledged.      Diabetes mellitus type 2 in obese (HCC)    Worse Start Ozempic 0.25 mg q 1 week x 2 wks, then go to 0.5 mg      Relevant Medications   Semaglutide,0.25 or 0.5MG /DOS, (OZEMPIC, 0.25 OR 0.5 MG/DOSE,) 2 MG/1.5ML SOPN   Other Relevant Orders   Comprehensive metabolic panel   Hemoglobin A1c   Elevated glucose    Start Ozempic q 1 week      Low back pain radiating to left lower extremity    Diclofenac po prn  Potential benefits of a long term NSAIDs use as well as potential risks  and complications were explained to the patient and were aknowledged.         Meds ordered this encounter  Medications   Semaglutide,0.25 or 0.5MG /DOS, (OZEMPIC, 0.25 OR 0.5 MG/DOSE,) 2 MG/1.5ML  SOPN    Sig: Inject 0.5 mg into the skin once a week.    Dispense:  1.5 mL    Refill:  2       Follow-up: Return in about 3 months (around 07/24/2021) for a follow-up visit.  Walker Kehr, MD

## 2021-05-01 ENCOUNTER — Other Ambulatory Visit (INDEPENDENT_AMBULATORY_CARE_PROVIDER_SITE_OTHER): Payer: 59

## 2021-05-01 ENCOUNTER — Other Ambulatory Visit: Payer: Self-pay

## 2021-05-01 DIAGNOSIS — E669 Obesity, unspecified: Secondary | ICD-10-CM

## 2021-05-01 DIAGNOSIS — E1169 Type 2 diabetes mellitus with other specified complication: Secondary | ICD-10-CM | POA: Diagnosis not present

## 2021-05-01 LAB — COMPREHENSIVE METABOLIC PANEL
ALT: 31 U/L (ref 0–53)
AST: 19 U/L (ref 0–37)
Albumin: 4.4 g/dL (ref 3.5–5.2)
Alkaline Phosphatase: 56 U/L (ref 39–117)
BUN: 14 mg/dL (ref 6–23)
CO2: 27 mEq/L (ref 19–32)
Calcium: 9.4 mg/dL (ref 8.4–10.5)
Chloride: 104 mEq/L (ref 96–112)
Creatinine, Ser: 0.79 mg/dL (ref 0.40–1.50)
GFR: 97.58 mL/min (ref >60.00)
Glucose, Bld: 154 mg/dL — ABNORMAL HIGH (ref 70–99)
Potassium: 4.1 mEq/L (ref 3.5–5.1)
Sodium: 137 mEq/L (ref 135–145)
Total Bilirubin: 1.3 mg/dL — ABNORMAL HIGH (ref 0.2–1.2)
Total Protein: 7.1 g/dL (ref 6.0–8.3)

## 2021-05-01 LAB — HEMOGLOBIN A1C: Hgb A1c MFr Bld: 7.5 % — ABNORMAL HIGH (ref 4.6–6.5)

## 2021-05-28 ENCOUNTER — Other Ambulatory Visit: Payer: Self-pay

## 2021-05-28 ENCOUNTER — Ambulatory Visit
Admission: EM | Admit: 2021-05-28 | Discharge: 2021-05-28 | Disposition: A | Discharge status: Home / Self Care | Payer: 59 | Attending: Emergency Medicine | Admitting: Emergency Medicine

## 2021-05-28 DIAGNOSIS — L818 Other specified disorders of pigmentation: Secondary | ICD-10-CM | POA: Diagnosis not present

## 2021-05-28 DIAGNOSIS — L03311 Cellulitis of abdominal wall: Secondary | ICD-10-CM | POA: Diagnosis not present

## 2021-05-28 MED ORDER — SULFAMETHOXAZOLE-TRIMETHOPRIM 800-160 MG PO TABS: 1.0000 | ORAL_TABLET | Freq: Two times a day (BID) | ORAL | 0 refills | Status: AC
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
## 2021-05-28 NOTE — Discharge Instructions (Addendum)
Please begin Bactrim 1 tablet twice daily for the infection of your tattoo.  Please apply Aquaphor to the area 2-3 times a day, keep skin clean and dry, I do not recommend that you cover it with a bandage.  If you have not seen significant improvement of the redness and irritation in the next 3 days, please return for repeat evaluation, you may require to different antibiotic.

## 2021-05-28 NOTE — ED Triage Notes (Signed)
Pt has a new tattoo on his left side, he reports redness and itching to the site.

## 2021-06-01 ENCOUNTER — Telehealth: Payer: Self-pay

## 2021-06-01 ENCOUNTER — Other Ambulatory Visit: Payer: Self-pay | Admitting: Internal Medicine

## 2021-06-01 ENCOUNTER — Telehealth: Payer: Self-pay | Admitting: Emergency Medicine

## 2021-06-01 MED ORDER — TRIAMCINOLONE ACETONIDE 0.1 % EX OINT: 1.0000 "application " | TOPICAL_OINTMENT | Freq: Two times a day (BID) | CUTANEOUS | 0 refills | Status: DC

## 2021-06-01 NOTE — Telephone Encounter (Signed)
Patient is requesting a refill of the following medications: Requested Prescriptions   Pending Prescriptions Disp Refills   ALPRAZolam (XANAX) 1 MG tablet [Pharmacy Med Name: ALPRAZOLAM 1 MG TABLET] 60 tablet 0    Sig: TAKE 1 TABLET BY MOUTH TWICE A DAY AS NEEDED    Date of patient request: 06/01/21  Last office visit: 05/03/21  Date of last refill: n/a Last refill amount: n/a Follow up time period per chart: 07/26/21

## 2021-06-01 NOTE — Telephone Encounter (Signed)
Patient called stating that the redness and irritation around his tattoo had not resolved after antibiotics, patient was sent a prescription for triamcinolone ointment to apply twice daily and advised to follow-up with his primary care provider.

## 2021-06-01 NOTE — Telephone Encounter (Signed)
Patient called stating the site of his tattoo is not getting better. Provider made aware, new medication sent to pharmacy. Patient was updated and educated on new prescription. All questions answered.

## 2021-06-14 ENCOUNTER — Telehealth: Payer: Self-pay | Admitting: Internal Medicine

## 2021-06-14 NOTE — Telephone Encounter (Signed)
Medication is Semaglutide,0.25 or 0.5MG /DOS, (OZEMPIC, 0.25 OR 0.5 MG/DOSE,) 2 MG/1.5ML SOPN

## 2021-06-14 NOTE — Telephone Encounter (Signed)
Patient calling in  Patient says he called pharmacy to refill & they advised that there is a nation wide shortage  Patient says he has reached out to other pharmacies & they are oos as well  Wants to know what provider recommends  Please call [phone]

## 2021-06-15 NOTE — Telephone Encounter (Signed)
Called pt to inform MD is out of the office until Tues 06/19/21. Pt states he normally takes med on Thursday did not take  yesterday. Will hold until MD return for his recommendations.Marland KitchenJohny Chess

## 2021-06-15 NOTE — Telephone Encounter (Signed)
Do they have the next step up dose for Ozempic? Thx

## 2021-06-19 ENCOUNTER — Other Ambulatory Visit: Payer: Self-pay | Admitting: Internal Medicine

## 2021-06-19 ENCOUNTER — Telehealth: Payer: Self-pay

## 2021-06-19 NOTE — Telephone Encounter (Signed)
Pt has been told from Pharmacy that the [facility] needed PA.  Pt CB [phone]

## 2021-06-20 NOTE — Telephone Encounter (Signed)
Pt is calling to check the status of the PA for Ozempic.

## 2021-06-21 MED ORDER — TRULICITY 0.75 MG/0.5ML ~~LOC~~ SOAJ: 0.7500 mg | SUBCUTANEOUS | 3 refills | Status: DC

## 2021-06-21 NOTE — Telephone Encounter (Signed)
Okay. Thank you.

## 2021-06-21 NOTE — Telephone Encounter (Signed)
Returned patients call to request updated insurance card be brought into the office to began a prior authorization. Patient know Ozempic is likely to be denied by insurance and asked if the provider would consider Trulicity.

## 2021-06-22 ENCOUNTER — Other Ambulatory Visit: Payer: Self-pay | Admitting: Internal Medicine

## 2021-06-27 ENCOUNTER — Other Ambulatory Visit: Payer: Self-pay | Admitting: Internal Medicine

## 2021-06-27 NOTE — Telephone Encounter (Signed)
Pt checking status of PA, informed pt per phone call w/cma, pt was informed ozempic possibly would be denied and pt requested a rx for trulicity which was sent to the pharmacy on 2-13  Pt states he was not told ozempic would possibly be denied and is requesting to continue the PA for ozempic

## 2021-06-27 NOTE — Telephone Encounter (Signed)
Pt calling back stating that the Pharmacy said trulicity  needed a PA as well.

## 2021-06-29 ENCOUNTER — Other Ambulatory Visit: Payer: Self-pay | Admitting: Internal Medicine

## 2021-07-02 NOTE — Telephone Encounter (Signed)
PA started.  Key: [id number]

## 2021-07-03 NOTE — Telephone Encounter (Signed)
Most recent office not faxed to the number provided below. Confirmation has been received.

## 2021-07-03 NOTE — Telephone Encounter (Signed)
Insurance PA department is needing the last 2-3 Office notes before approving the PA.   Please fax to [phone]

## 2021-07-03 NOTE — Telephone Encounter (Signed)
She just said the most recent.

## 2021-07-26 ENCOUNTER — Encounter: Payer: Self-pay | Admitting: Internal Medicine

## 2021-07-26 ENCOUNTER — Ambulatory Visit (INDEPENDENT_AMBULATORY_CARE_PROVIDER_SITE_OTHER): Payer: 59 | Admitting: Internal Medicine

## 2021-07-26 ENCOUNTER — Other Ambulatory Visit: Payer: Self-pay

## 2021-07-26 DIAGNOSIS — F419 Anxiety disorder, unspecified: Secondary | ICD-10-CM

## 2021-07-26 DIAGNOSIS — E1169 Type 2 diabetes mellitus with other specified complication: Secondary | ICD-10-CM

## 2021-07-26 DIAGNOSIS — I10 Essential (primary) hypertension: Secondary | ICD-10-CM

## 2021-07-26 DIAGNOSIS — E669 Obesity, unspecified: Secondary | ICD-10-CM

## 2021-07-26 DIAGNOSIS — I25118 Atherosclerotic heart disease of native coronary artery with other forms of angina pectoris: Secondary | ICD-10-CM

## 2021-07-26 MED ORDER — METOPROLOL TARTRATE 25 MG PO TABS: 12.5000 mg | ORAL_TABLET | Freq: Two times a day (BID) | ORAL | 3 refills | Status: DC

## 2021-07-26 MED ORDER — ALPRAZOLAM 1 MG PO TABS: 1.0000 mg | ORAL_TABLET | Freq: Two times a day (BID) | ORAL | 1 refills | Status: DC | PRN

## 2021-07-26 MED ORDER — ISOSORBIDE MONONITRATE ER 30 MG PO TB24: 30.0000 mg | ORAL_TABLET | Freq: Every day | ORAL | 3 refills | Status: DC

## 2021-07-26 MED ORDER — DICLOFENAC SODIUM 75 MG PO TBEC: 75.0000 mg | DELAYED_RELEASE_TABLET | Freq: Two times a day (BID) | ORAL | 5 refills | Status: DC | PRN

## 2021-07-26 MED ORDER — OZEMPIC (0.25 OR 0.5 MG/DOSE) 2 MG/1.5ML ~~LOC~~ SOPN: 0.5000 mg | PEN_INJECTOR | SUBCUTANEOUS | 3 refills | Status: DC

## 2021-07-26 NOTE — Progress Notes (Signed)
? ?Subjective:  ?Patient ID: [name], male    DOB: 1961/05/04  Age: 61 y.o. MRN: [id number] ? ?CC: No chief complaint on file. ? ? ?HPI ?[name] presents for DM, HTN, CAD, anxiety ? ?Outpatient Medications Prior to Visit  ?Medication Sig Dispense Refill  ? aspirin EC 81 MG tablet Take 81 mg by mouth daily. Swallow whole.    ? nitroGLYCERIN (NITROSTAT) 0.4 MG SL tablet Place 0.4 mg under the tongue every 5 (five) minutes as needed for chest pain.    ? SYMBICORT 160-4.5 MCG/ACT inhaler INHALE 2 PUFS INTO THE LUNGS TWICE A DAY 10.2 each 11  ? triamcinolone ointment (KENALOG) 0.1 % Apply 1 application topically 2 (two) times daily. 30 g 0  ? ALPRAZolam (XANAX) 1 MG tablet TAKE 1 TABLET BY MOUTH TWICE A DAY AS NEEDED 60 tablet 1  ? diclofenac (VOLTAREN) 75 MG EC tablet Take 1 tablet (75 mg total) by mouth 2 (two) times daily as needed for moderate pain (pc). 60 tablet 5  ? isosorbide mononitrate (IMDUR) 30 MG 24 hr tablet Take 1 tablet (30 mg total) by mouth daily. 90 tablet 3  ? metoprolol tartrate (LOPRESSOR) 25 MG tablet Take 0.5 tablets (12.5 mg total) by mouth 2 (two) times daily. 90 tablet 3  ? TRULICITY 9.83 JA/2.5KN SOPN INJECT 0.75 MG INTO THE SKIN ONCE A WEEK. 2 mL 3  ? pravastatin (PRAVACHOL) 20 MG tablet Take 1 tablet (20 mg total) by mouth every evening. 90 tablet 3  ? ?No facility-administered medications prior to visit.  ? ? ?ROS: ?Review of Systems  ?Constitutional:  Positive for unexpected weight change. Negative for appetite change and fatigue.  ?HENT:  Negative for congestion, nosebleeds, sneezing, sore throat and trouble swallowing.   ?Eyes:  Negative for itching and visual disturbance.  ?Respiratory:  Negative for cough.   ?Cardiovascular:  Negative for chest pain, palpitations and leg swelling.  ?Gastrointestinal:  Negative for abdominal distention, blood in stool, diarrhea and nausea.  ?Genitourinary:  Negative for frequency and hematuria.  ?Musculoskeletal:   Negative for back pain, gait problem, joint swelling and neck pain.  ?Skin:  Negative for rash.  ?Neurological:  Negative for dizziness, tremors, speech difficulty and weakness.  ?Psychiatric/Behavioral:  Negative for agitation, dysphoric mood and sleep disturbance. The patient is not nervous/anxious.   ? ?Objective:  ?BP 130/88 (BP Location: Left Arm, Patient Position: Sitting, Cuff Size: Large)   Pulse 72   Temp 97.8 ?F (36.6 ?C) (Oral)   Ht 6' (1.829 m)   Wt 284 lb (128.8 kg)   SpO2 97%   BMI 38.52 kg/m?  ? ?BP Readings from Last 3 Encounters:  ?07/26/21 130/88  ?05/28/21 (!) 153/87  ?04/25/21 140/82  ? ? ?Wt Readings from Last 3 Encounters:  ?07/26/21 284 lb (128.8 kg)  ?04/25/21 287 lb (130.2 kg)  ?02/22/21 288 lb (130.6 kg)  ? ? ?Physical Exam ?Constitutional:   ?   General: He is not in acute distress. ?   Appearance: He is well-developed. He is obese.  ?   Comments: NAD  ?Eyes:  ?   Conjunctiva/sclera: Conjunctivae normal.  ?   Pupils: Pupils are equal, round, and reactive to light.  ?Neck:  ?   Thyroid: No thyromegaly.  ?   Vascular: No JVD.  ?Cardiovascular:  ?   Rate and Rhythm: Normal rate and regular rhythm.  ?   Heart sounds: Normal heart sounds. No murmur heard. ?  No friction rub. No gallop.  ?  Pulmonary:  ?   Effort: Pulmonary effort is normal. No respiratory distress.  ?   Breath sounds: Normal breath sounds. No wheezing or rales.  ?Chest:  ?   Chest wall: No tenderness.  ?Abdominal:  ?   General: Bowel sounds are normal. There is no distension.  ?   Palpations: Abdomen is soft. There is no mass.  ?   Tenderness: There is no abdominal tenderness. There is no guarding or rebound.  ?Musculoskeletal:     ?   General: Tenderness present. Normal range of motion.  ?   Cervical back: Normal range of motion.  ?Lymphadenopathy:  ?   Cervical: No cervical adenopathy.  ?Skin: ?   General: Skin is warm and dry.  ?   Findings: No rash.  ?Neurological:  ?   Mental Status: He is alert and oriented to person,  place, and time.  ?   Cranial Nerves: No cranial nerve deficit.  ?   Motor: No abnormal muscle tone.  ?   Coordination: Coordination normal.  ?   Gait: Gait normal.  ?   Deep Tendon Reflexes: Reflexes are normal and symmetric.  ?Psychiatric:     ?   Behavior: Behavior normal.     ?   Thought Content: Thought content normal.     ?   Judgment: Judgment normal.  ? ? ?Lab Results  ?Component Value Date  ? WBC 6.4 12/29/2017  ? HGB 15.1 12/29/2017  ? HCT 44.2 12/29/2017  ? PLT 160.0 12/29/2017  ? GLUCOSE 154 (H) 05/01/2021  ? CHOL 113 12/29/2017  ? TRIG 335.0 (H) 12/29/2017  ? HDL 31.80 (L) 12/29/2017  ? LDLDIRECT 38.0 12/29/2017  ? [place] 15 10/18/2013  ? ALT 31 05/01/2021  ? AST 19 05/01/2021  ? NA 137 05/01/2021  ? K 4.1 05/01/2021  ? CL 104 05/01/2021  ? CREATININE 0.79 05/01/2021  ? BUN 14 05/01/2021  ? CO2 27 05/01/2021  ? TSH 1.88 12/29/2017  ? PSA 0.32 12/29/2017  ? INR 0.9 05/22/2016  ? HGBA1C 7.5 (H) 05/01/2021  ? ? ?No results found. ? ?Assessment & Plan:  ? ?Problem List Items Addressed This Visit   ? ? Anxiety disorder  ?  Cont on Alprazolam prn ? Potential benefits of a long term benzodiazepines  use as well as potential risks  and complications were explained to the patient and were aknowledged. ?  ?  ? Relevant Medications  ? ALPRAZolam (XANAX) 1 MG tablet  ? CAD (coronary artery disease)  ?  Stable.  No angina lately ?  ?  ? Relevant Medications  ? isosorbide mononitrate (IMDUR) 30 MG 24 hr tablet  ? metoprolol tartrate (LOPRESSOR) 25 MG tablet  ? Diabetes mellitus type 2 in obese [facility])  ?  Start Metformin 500 mg/d ?Start Ozempic 0.25 mg q 1 week x 2 wks, then go to 0.5 mg ?  ?  ? Relevant Medications  ? Semaglutide,0.25 or 0.'5MG'$ /DOS, (OZEMPIC, 0.25 OR 0.5 MG/DOSE,) 2 MG/1.5ML SOPN  ? HTN (hypertension)  ?  Chronic.  Continue metoprolol.  Pursue weight loss effort ?  ?  ? Relevant Medications  ? isosorbide mononitrate (IMDUR) 30 MG 24 hr tablet  ? metoprolol tartrate (LOPRESSOR) 25 MG tablet  ? Morbid  obesity ([place])  ?  Chronic.  [name] is trying to lose weight on diet.  We will try to PA Ozempic again ?  ?  ? Relevant Medications  ? Semaglutide,0.25 or 0.'5MG'$ /DOS, (OZEMPIC, 0.25 OR 0.5 MG/DOSE,)  2 MG/1.5ML SOPN  ?  ? ? ?Meds ordered this encounter  ?Medications  ? Semaglutide,0.25 or 0.'5MG'$ /DOS, (OZEMPIC, 0.25 OR 0.5 MG/DOSE,) 2 MG/1.5ML SOPN  ?  Sig: Inject 0.5 mg into the skin once a week.  ?  Dispense:  1.5 mL  ?  Refill:  3  ?  On Metformin  ? ALPRAZolam (XANAX) 1 MG tablet  ?  Sig: Take 1 tablet (1 mg total) by mouth 2 (two) times daily as needed.  ?  Dispense:  60 tablet  ?  Refill:  1  ?  This request is for a new prescription for a controlled substance as required by Federal/State law..  ? diclofenac (VOLTAREN) 75 MG EC tablet  ?  Sig: Take 1 tablet (75 mg total) by mouth 2 (two) times daily as needed for moderate pain (pc).  ?  Dispense:  60 tablet  ?  Refill:  5  ? isosorbide mononitrate (IMDUR) 30 MG 24 hr tablet  ?  Sig: Take 1 tablet (30 mg total) by mouth daily.  ?  Dispense:  90 tablet  ?  Refill:  3  ? metoprolol tartrate (LOPRESSOR) 25 MG tablet  ?  Sig: Take 0.5 tablets (12.5 mg total) by mouth 2 (two) times daily.  ?  Dispense:  90 tablet  ?  Refill:  3  ?  ? ? ?Follow-up: Return in about 3 months (around 10/26/2021) for a follow-up visit. ? ?Walker Kehr, MD ?

## 2021-07-26 NOTE — Assessment & Plan Note (Signed)
Start Metformin 500 mg/d ?Start Ozempic 0.25 mg q 1 week x 2 wks, then go to 0.5 mg ?

## 2021-07-26 NOTE — Assessment & Plan Note (Signed)
Cont on Alprazolam prn  Potential benefits of a long term benzodiazepines  use as well as potential risks  and complications were explained to the patient and were aknowledged. 

## 2021-07-29 NOTE — Assessment & Plan Note (Addendum)
Stable.  No angina lately ?

## 2021-07-29 NOTE — Assessment & Plan Note (Signed)
Chronic.  Continue metoprolol.  Pursue weight loss effort ?

## 2021-07-29 NOTE — Assessment & Plan Note (Signed)
Chronic.  [name] is trying to lose weight on diet.  We will try to PA Ozempic again ?

## 2021-10-29 ENCOUNTER — Other Ambulatory Visit: Payer: Self-pay | Admitting: Internal Medicine

## 2021-10-29 ENCOUNTER — Encounter: Payer: Self-pay | Admitting: Internal Medicine

## 2021-10-29 ENCOUNTER — Ambulatory Visit (INDEPENDENT_AMBULATORY_CARE_PROVIDER_SITE_OTHER): Payer: 59 | Admitting: Internal Medicine

## 2021-10-29 DIAGNOSIS — F419 Anxiety disorder, unspecified: Secondary | ICD-10-CM

## 2021-10-29 DIAGNOSIS — E785 Hyperlipidemia, unspecified: Secondary | ICD-10-CM | POA: Diagnosis not present

## 2021-10-29 DIAGNOSIS — G4733 Obstructive sleep apnea (adult) (pediatric): Secondary | ICD-10-CM

## 2021-10-29 DIAGNOSIS — E1169 Type 2 diabetes mellitus with other specified complication: Secondary | ICD-10-CM | POA: Diagnosis not present

## 2021-10-29 DIAGNOSIS — I10 Essential (primary) hypertension: Secondary | ICD-10-CM

## 2021-10-29 DIAGNOSIS — E669 Obesity, unspecified: Secondary | ICD-10-CM

## 2021-10-29 MED ORDER — PRAVASTATIN SODIUM 20 MG PO TABS: 20.0000 mg | ORAL_TABLET | Freq: Every evening | ORAL | 3 refills | Status: DC

## 2021-10-29 MED ORDER — ALPRAZOLAM 1 MG PO TABS: 1.0000 mg | ORAL_TABLET | Freq: Two times a day (BID) | ORAL | 1 refills | Status: DC | PRN

## 2021-10-29 MED ORDER — DICLOFENAC SODIUM 75 MG PO TBEC: 75.0000 mg | DELAYED_RELEASE_TABLET | Freq: Two times a day (BID) | ORAL | 5 refills | Status: DC | PRN

## 2021-10-29 MED ORDER — ISOSORBIDE MONONITRATE ER 30 MG PO TB24: 30.0000 mg | ORAL_TABLET | Freq: Every day | ORAL | 3 refills | Status: DC

## 2021-10-29 MED ORDER — BUDESONIDE-FORMOTEROL FUMARATE 160-4.5 MCG/ACT IN AERO: INHALATION_SPRAY | RESPIRATORY_TRACT | 11 refills | Status: DC

## 2021-10-29 MED ORDER — METFORMIN HCL 500 MG PO TABS: 500.0000 mg | ORAL_TABLET | Freq: Every day | ORAL | 3 refills | Status: DC

## 2021-10-29 MED ORDER — TRULICITY 1.5 MG/0.5ML ~~LOC~~ SOAJ: 1.5000 mg | SUBCUTANEOUS | 3 refills | Status: DC

## 2021-10-29 MED ORDER — METOPROLOL TARTRATE 25 MG PO TABS: 12.5000 mg | ORAL_TABLET | Freq: Two times a day (BID) | ORAL | 3 refills | Status: DC

## 2021-10-29 MED ORDER — NITROGLYCERIN 0.4 MG SL SUBL: 0.4000 mg | SUBLINGUAL_TABLET | SUBLINGUAL | 3 refills | Status: DC | PRN

## 2021-10-29 NOTE — Assessment & Plan Note (Signed)
Cont on Metformin 500 mg/d - re-start Start Trulicitu - on the plan

## 2021-10-29 NOTE — Assessment & Plan Note (Signed)
Pt lost wt A new sleep study and Pulm ref was offered and declined

## 2021-10-29 NOTE — Assessment & Plan Note (Signed)
Cont on Alprazolam prn  Potential benefits of a long term benzodiazepines  use as well as potential risks  and complications were explained to the patient and were aknowledged. 

## 2021-10-29 NOTE — Assessment & Plan Note (Addendum)
Ozempic - not covered Start Trulicity Re-start Metformin

## 2021-10-29 NOTE — Assessment & Plan Note (Signed)
Chronic.  Continue metoprolol.  Pursue weight loss effort

## 2021-10-29 NOTE — Assessment & Plan Note (Signed)
On Lipitor - not taking

## 2021-10-29 NOTE — Progress Notes (Signed)
Subjective:  Patient ID: [name], male    DOB: [birth year]  Age: 61 y.o. MRN: [id number]  CC: No chief complaint on file.   HPI [name] presents for DM, obesity and HTN  Outpatient Medications Prior to Visit  Medication Sig Dispense Refill   ALPRAZolam (XANAX) 1 MG tablet Take 1 tablet (1 mg total) by mouth 2 (two) times daily as needed. 60 tablet 1   aspirin EC 81 MG tablet Take 81 mg by mouth daily. Swallow whole.     diclofenac (VOLTAREN) 75 MG EC tablet Take 1 tablet (75 mg total) by mouth 2 (two) times daily as needed for moderate pain (pc). 60 tablet 5   isosorbide mononitrate (IMDUR) 30 MG 24 hr tablet Take 1 tablet (30 mg total) by mouth daily. 90 tablet 3   metoprolol tartrate (LOPRESSOR) 25 MG tablet Take 0.5 tablets (12.5 mg total) by mouth 2 (two) times daily. 90 tablet 3   nitroGLYCERIN (NITROSTAT) 0.4 MG SL tablet Place 0.4 mg under the tongue every 5 (five) minutes as needed for chest pain.     SYMBICORT 160-4.5 MCG/ACT inhaler INHALE 2 PUFS INTO THE LUNGS TWICE A DAY 10.2 each 11   triamcinolone ointment (KENALOG) 0.1 % Apply 1 application topically 2 (two) times daily. 30 g 0   Semaglutide,0.25 or 0.'5MG'$ /DOS, (OZEMPIC, 0.25 OR 0.5 MG/DOSE,) 2 MG/1.5ML SOPN Inject 0.5 mg into the skin once a week. 1.5 mL 3   pravastatin (PRAVACHOL) 20 MG tablet Take 1 tablet (20 mg total) by mouth every evening. 90 tablet 3   No facility-administered medications prior to visit.    ROS: Review of Systems  Constitutional:  Negative for appetite change, fatigue and unexpected weight change.  HENT:  Negative for congestion, nosebleeds, sneezing, sore throat and trouble swallowing.   Eyes:  Negative for itching and visual disturbance.  Respiratory:  Negative for cough.   Cardiovascular:  Negative for chest pain, palpitations and leg swelling.  Gastrointestinal:  Negative for abdominal distention, blood in stool, diarrhea and nausea.  Genitourinary:   Negative for frequency and hematuria.  Musculoskeletal:  Negative for back pain, gait problem, joint swelling and neck pain.  Skin:  Negative for rash.  Neurological:  Negative for dizziness, tremors, speech difficulty and weakness.  Psychiatric/Behavioral:  Negative for agitation, dysphoric mood and sleep disturbance. The patient is not nervous/anxious.     Objective:  BP (!) 140/92 (BP Location: Left Arm, Patient Position: Sitting, Cuff Size: Normal)   Pulse 66   Temp 97.7 F (36.5 C) (Oral)   Ht 6' (1.829 m)   Wt 284 lb (128.8 kg)   SpO2 93%   BMI 38.52 kg/m   BP Readings from Last 3 Encounters:  10/29/21 (!) 140/92  07/26/21 130/88  05/28/21 (!) 153/87    Wt Readings from Last 3 Encounters:  10/29/21 284 lb (128.8 kg)  07/26/21 284 lb (128.8 kg)  04/25/21 287 lb (130.2 kg)    Physical Exam Constitutional:      General: He is not in acute distress.    Appearance: He is well-developed. He is obese.     Comments: NAD  Eyes:     Conjunctiva/sclera: Conjunctivae normal.     Pupils: Pupils are equal, round, and reactive to light.  Neck:     Thyroid: No thyromegaly.     Vascular: No JVD.  Cardiovascular:     Rate and Rhythm: Normal rate and regular rhythm.     Heart sounds: Normal  heart sounds. No murmur heard.    No friction rub. No gallop.  Pulmonary:     Effort: Pulmonary effort is normal. No respiratory distress.     Breath sounds: Normal breath sounds. No wheezing or rales.  Chest:     Chest wall: No tenderness.  Abdominal:     General: Bowel sounds are normal. There is no distension.     Palpations: Abdomen is soft. There is no mass.     Tenderness: There is no abdominal tenderness. There is no guarding or rebound.  Musculoskeletal:        General: No tenderness. Normal range of motion.     Cervical back: Normal range of motion.  Lymphadenopathy:     Cervical: No cervical adenopathy.  Skin:    General: Skin is warm and dry.     Findings: No rash.   Neurological:     Mental Status: He is alert and oriented to person, place, and time.     Cranial Nerves: No cranial nerve deficit.     Motor: No abnormal muscle tone.     Coordination: Coordination normal.     Gait: Gait normal.     Deep Tendon Reflexes: Reflexes are normal and symmetric.  Psychiatric:        Behavior: Behavior normal.        Thought Content: Thought content normal.        Judgment: Judgment normal.     Lab Results  Component Value Date   WBC 6.4 12/29/2017   HGB 15.1 12/29/2017   HCT 44.2 12/29/2017   PLT 160.0 12/29/2017   GLUCOSE 154 (H) 05/01/2021   CHOL 113 12/29/2017   TRIG 335.0 (H) 12/29/2017   HDL 31.80 (L) 12/29/2017   LDLDIRECT 38.0 12/29/2017   LDLCALC 15 10/18/2013   ALT 31 05/01/2021   AST 19 05/01/2021   NA 137 05/01/2021   K 4.1 05/01/2021   CL 104 05/01/2021   CREATININE 0.79 05/01/2021   BUN 14 05/01/2021   CO2 27 05/01/2021   TSH 1.88 12/29/2017   PSA 0.32 12/29/2017   INR 0.9 05/22/2016   HGBA1C 7.5 (H) 05/01/2021    No results found.  Assessment & Plan:   Problem List Items Addressed This Visit   None     Meds ordered this encounter  Medications   Dulaglutide (TRULICITY) 1.5 MQ/2.8MN SOPN    Sig: Inject 1.5 mg into the skin once a week.    Dispense:  2 mL    Refill:  3    Hgb A1c 7.5%      Follow-up: No follow-ups on file.  Walker Kehr, MD

## 2021-11-30 ENCOUNTER — Ambulatory Visit
Admission: EM | Admit: 2021-11-30 | Discharge: 2021-11-30 | Disposition: A | Discharge status: Home / Self Care | Payer: BC Managed Care – PPO | Attending: Urgent Care | Admitting: Urgent Care

## 2021-11-30 DIAGNOSIS — H6122 Impacted cerumen, left ear: Secondary | ICD-10-CM

## 2021-11-30 DIAGNOSIS — L739 Follicular disorder, unspecified: Secondary | ICD-10-CM | POA: Diagnosis not present

## 2021-11-30 MED ORDER — CEPHALEXIN 500 MG PO CAPS: 500.0000 mg | ORAL_CAPSULE | Freq: Three times a day (TID) | ORAL | 0 refills | Status: DC

## 2021-11-30 MED ORDER — CARBAMIDE PEROXIDE 6.5 % OT SOLN: 5.0000 [drp] | Freq: Two times a day (BID) | OTIC | 0 refills | Status: DC

## 2021-11-30 NOTE — ED Provider Notes (Signed)
[place]   MRN: [id number] DOB: [birth year]  Subjective:   [name] is a 60 y.o. male presenting for 1 week history of acute onset persistent left ear discomfort.  He also developed a rash over his lower legs.  No chest pain, shortness of breath or wheezing.  Has a history of cerumen impaction.  Regularly uses Q-tips within his ears.  No current facility-administered medications for this encounter.  Current Outpatient Medications:    ALPRAZolam (XANAX) 1 MG tablet, Take 1 tablet (1 mg total) by mouth 2 (two) times daily as needed., Disp: 60 tablet, Rfl: 1   aspirin EC 81 MG tablet, Take 81 mg by mouth daily. Swallow whole., Disp: , Rfl:    budesonide-formoterol (SYMBICORT) 160-4.5 MCG/ACT inhaler, INHALE 2 PUFS INTO THE LUNGS TWICE A DAY, Disp: 10.2 each, Rfl: 11   diclofenac (VOLTAREN) 75 MG EC tablet, Take 1 tablet (75 mg total) by mouth 2 (two) times daily as needed for moderate pain (pc)., Disp: 60 tablet, Rfl: 5   Dulaglutide (TRULICITY) 1.5 ZD/6.3OV SOPN, Inject 1.5 mg into the skin once a week., Disp: 2 mL, Rfl: 3   isosorbide mononitrate (IMDUR) 30 MG 24 hr tablet, Take 1 tablet (30 mg total) by mouth daily., Disp: 90 tablet, Rfl: 3   metFORMIN (GLUCOPHAGE) 500 MG tablet, Take 1 tablet (500 mg total) by mouth daily with breakfast., Disp: 90 tablet, Rfl: 3   metoprolol tartrate (LOPRESSOR) 25 MG tablet, Take 0.5 tablets (12.5 mg total) by mouth 2 (two) times daily., Disp: 90 tablet, Rfl: 3   nitroGLYCERIN (NITROSTAT) 0.4 MG SL tablet, Place 1 tablet (0.4 mg total) under the tongue every 5 (five) minutes as needed for chest pain., Disp: 20 tablet, Rfl: 3   pravastatin (PRAVACHOL) 20 MG tablet, Take 1 tablet (20 mg total) by mouth every evening., Disp: 90 tablet, Rfl: 3   triamcinolone ointment (KENALOG) 0.1 %, Apply 1 application topically 2 (two) times daily., Disp: 30 g, Rfl: 0   Allergies  Allergen Reactions   Ace Inhibitors     Lisinopril     cough   Paxlovid [Nirmatrelvir-Ritonavir]     ?rash   Tramadol     Pt had seizures while on it    Past Medical History:  Diagnosis Date   Anxiety    CAD (coronary artery disease)    Cocaine abuse ([place])    none now   Diabetes mellitus     II, borderline, diet controlled   GERD (gastroesophageal reflux disease)    Hyperlipidemia    Myocardial infarction ([place]) "distant past"   related to cocaine abuse(over 10 years ago per pt)   Obesity, unspecified    OSA (obstructive sleep apnea)    uses BIPAP   Pain, low back    Seizures ([place])    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 leg   FRACTURE SURGERY     L. arm   LEFT HEART CATHETERIZATION WITH CORONARY ANGIOGRAM N/A 11/22/2013   Procedure: LEFT HEART CATHETERIZATION WITH CORONARY ANGIOGRAM;  Surgeon: Larey Dresser, MD;  Location: [facility] CATH LAB;  Service: Cardiovascular;  Laterality: N/A;    Family History  Problem Relation Age of Onset   Heart attack  Father 72       grandfather died MI 74   Heart disease Father        cad   Cancer Mother        lung ca   Heart attack Other    Atrial fibrillation Other    Colon cancer Neg Hx    Colon polyps Neg Hx    Esophageal cancer Neg Hx    Rectal cancer Neg Hx    Stomach cancer Neg Hx     Social History   Tobacco Use   Smoking status: Every Day    Packs/day: 2.00    Years: 25.00    Total pack years: 50.00    Types: Cigarettes   Smokeless tobacco: Never  Vaping Use   Vaping Use: Never used  Substance Use Topics   Alcohol use: Yes    Alcohol/week: 12.0 standard drinks of alcohol    Types: 12 Cans of beer per week   Drug use: Yes    Frequency: 7.0 times per week    Types: Marijuana    Comment: cocaine abuse yrs ago. none now.    ROS   Objective:   Vitals: BP (!) 163/93 (BP Location: Right  Arm)   Pulse 69   Temp 97.7 F (36.5 C) (Oral)   Resp 18   SpO2 94%   Physical Exam Constitutional:      General: He is not in acute distress.    Appearance: Normal appearance. He is well-developed and normal weight. He is not ill-appearing, toxic-appearing or diaphoretic.  HENT:     Head: Normocephalic and atraumatic.     Right Ear: Tympanic membrane, ear canal and external ear normal. There is no impacted cerumen.     Left Ear: Tympanic membrane, ear canal and external ear normal. There is impacted cerumen.     Nose: Nose normal.     Mouth/Throat:     Pharynx: Oropharynx is clear.  Eyes:     General: No scleral icterus.       Right eye: No discharge.        Left eye: No discharge.     Extraocular Movements: Extraocular movements intact.  Cardiovascular:     Rate and Rhythm: Normal rate.  Pulmonary:     Effort: Pulmonary effort is normal.  Musculoskeletal:     Cervical back: Normal range of motion.  Skin:    Findings: Rash (Scattered pustular erythematous like lesions all associated with hair follicles over the lower anterior legs) present.  Neurological:     Mental Status: He is alert and oriented to person, place, and time.  Psychiatric:        Mood and Affect: Mood normal.        Behavior: Behavior normal.        Thought Content: Thought content normal.        Judgment: Judgment normal.    Ear lavage performed using mixture of peroxide and water.  Pressure irrigation performed using a bottle and a thin ear tube.  Left ear lavage.  No curette was used.   Assessment and Plan :   PDMP not reviewed this encounter.  1. Impacted cerumen of left ear   2. Folliculitis    Successful left ear lavage.  General management of cerumen impaction reviewed with patient.  Anticipatory guidance provided.  Start Keflex to help with folliculitis.  Use supportive care otherwise.  Counseled patient on potential for adverse effects with medications prescribed/recommended today, ER and  return-to-clinic precautions  discussed, patient verbalized understanding.    Jaynee Eagles, PA-C 11/30/21 1130

## 2021-11-30 NOTE — ED Triage Notes (Signed)
The pt c/o left ear discomfort that began a week ago.    Home interventions: flonase

## 2022-01-30 ENCOUNTER — Ambulatory Visit (INDEPENDENT_AMBULATORY_CARE_PROVIDER_SITE_OTHER): Payer: BC Managed Care – PPO | Admitting: Internal Medicine

## 2022-01-30 ENCOUNTER — Encounter: Payer: Self-pay | Admitting: Internal Medicine

## 2022-01-30 DIAGNOSIS — H6122 Impacted cerumen, left ear: Secondary | ICD-10-CM

## 2022-01-30 DIAGNOSIS — I252 Old myocardial infarction: Secondary | ICD-10-CM | POA: Diagnosis not present

## 2022-01-30 DIAGNOSIS — E1169 Type 2 diabetes mellitus with other specified complication: Secondary | ICD-10-CM | POA: Diagnosis not present

## 2022-01-30 DIAGNOSIS — E669 Obesity, unspecified: Secondary | ICD-10-CM

## 2022-01-30 DIAGNOSIS — I25118 Atherosclerotic heart disease of native coronary artery with other forms of angina pectoris: Secondary | ICD-10-CM

## 2022-01-30 DIAGNOSIS — Z23 Encounter for immunization: Secondary | ICD-10-CM

## 2022-01-30 DIAGNOSIS — E119 Type 2 diabetes mellitus without complications: Secondary | ICD-10-CM

## 2022-01-30 NOTE — Assessment & Plan Note (Addendum)
Wt Readings from Last 3 Encounters:  01/30/22 285 lb 12.8 oz (129.6 kg)  10/29/21 284 lb (128.8 kg)  07/26/21 284 lb (128.8 kg)   On diet

## 2022-01-30 NOTE — Assessment & Plan Note (Signed)
L ear Use gtt at home, then irrigate

## 2022-01-30 NOTE — Assessment & Plan Note (Signed)
Cont on Lipitor, ASA, Imdur No CP

## 2022-01-30 NOTE — Assessment & Plan Note (Signed)
Metformin 500 mg/d

## 2022-01-30 NOTE — Progress Notes (Signed)
Subjective:  Patient ID: [name], male    DOB: 15-Jan-1961  Age: 61 y.o. MRN: [id number]  CC: Follow-up (3 month f/u- Flu shot)   HPI [name] presents for anxiety, HTN, CAD  C/o L ear discomfort  Outpatient Medications Prior to Visit  Medication Sig Dispense Refill   ALPRAZolam (XANAX) 1 MG tablet Take 1 tablet (1 mg total) by mouth 2 (two) times daily as needed. 60 tablet 1   aspirin EC 81 MG tablet Take 81 mg by mouth daily. Swallow whole.     budesonide-formoterol (SYMBICORT) 160-4.5 MCG/ACT inhaler INHALE 2 PUFS INTO THE LUNGS TWICE A DAY 10.2 each 11   isosorbide mononitrate (IMDUR) 30 MG 24 hr tablet Take 1 tablet (30 mg total) by mouth daily. 90 tablet 3   metFORMIN (GLUCOPHAGE) 500 MG tablet Take 1 tablet (500 mg total) by mouth daily with breakfast. 90 tablet 3   nitroGLYCERIN (NITROSTAT) 0.4 MG SL tablet Place 1 tablet (0.4 mg total) under the tongue every 5 (five) minutes as needed for chest pain. 20 tablet 3   carbamide peroxide (DEBROX) 6.5 % OTIC solution Place 5 drops into both ears 2 (two) times daily. (Patient not taking: Reported on 01/30/2022) 15 mL 0   cephALEXin (KEFLEX) 500 MG capsule Take 1 capsule (500 mg total) by mouth 3 (three) times daily. (Patient not taking: Reported on 01/30/2022) 21 capsule 0   diclofenac (VOLTAREN) 75 MG EC tablet Take 1 tablet (75 mg total) by mouth 2 (two) times daily as needed for moderate pain (pc). (Patient not taking: Reported on 01/30/2022) 60 tablet 5   Dulaglutide (TRULICITY) 1.5 SW/9.6PR SOPN Inject 1.5 mg into the skin once a week. (Patient not taking: Reported on 01/30/2022) 2 mL 3   metoprolol tartrate (LOPRESSOR) 25 MG tablet Take 0.5 tablets (12.5 mg total) by mouth 2 (two) times daily. (Patient not taking: Reported on 01/30/2022) 90 tablet 3   pravastatin (PRAVACHOL) 20 MG tablet Take 1 tablet (20 mg total) by mouth every evening. 90 tablet 3   triamcinolone ointment (KENALOG) 0.1 % Apply 1  application topically 2 (two) times daily. (Patient not taking: Reported on 01/30/2022) 30 g 0   No facility-administered medications prior to visit.    ROS: Review of Systems  Constitutional:  Negative for appetite change, fatigue and unexpected weight change.  HENT:  Positive for ear pain and hearing loss. Negative for congestion, ear discharge, nosebleeds, sneezing, sore throat and trouble swallowing.   Eyes:  Negative for itching and visual disturbance.  Respiratory:  Negative for cough.   Cardiovascular:  Negative for chest pain, palpitations and leg swelling.  Gastrointestinal:  Negative for abdominal distention, blood in stool, diarrhea and nausea.  Genitourinary:  Negative for frequency and hematuria.  Musculoskeletal:  Positive for back pain. Negative for gait problem, joint swelling and neck pain.  Skin:  Negative for rash.  Neurological:  Negative for dizziness, tremors, speech difficulty and weakness.  Psychiatric/Behavioral:  Negative for agitation, dysphoric mood, sleep disturbance and suicidal ideas. The patient is nervous/anxious.     Objective:  BP (!) 140/82 (BP Location: Left Arm)   Pulse 66   Temp 98.3 F (36.8 C) (Oral)   Ht 6' (1.829 m)   Wt 285 lb 12.8 oz (129.6 kg)   SpO2 94%   BMI 38.76 kg/m   BP Readings from Last 3 Encounters:  01/30/22 (!) 140/82  11/30/21 (!) 163/93  10/29/21 (!) 140/92    Wt Readings from  Last 3 Encounters:  01/30/22 285 lb 12.8 oz (129.6 kg)  10/29/21 284 lb (128.8 kg)  07/26/21 284 lb (128.8 kg)    Physical Exam Constitutional:      General: He is not in acute distress.    Appearance: He is well-developed. He is obese.     Comments: NAD  Eyes:     Conjunctiva/sclera: Conjunctivae normal.     Pupils: Pupils are equal, round, and reactive to light.  Neck:     Thyroid: No thyromegaly.     Vascular: No JVD.  Cardiovascular:     Rate and Rhythm: Normal rate and regular rhythm.     Heart sounds: Normal heart sounds. No  murmur heard.    No friction rub. No gallop.  Pulmonary:     Effort: Pulmonary effort is normal. No respiratory distress.     Breath sounds: Normal breath sounds. No wheezing or rales.  Chest:     Chest wall: No tenderness.  Abdominal:     General: Bowel sounds are normal. There is no distension.     Palpations: Abdomen is soft. There is no mass.     Tenderness: There is no abdominal tenderness. There is no guarding or rebound.  Musculoskeletal:        General: No tenderness. Normal range of motion.     Cervical back: Normal range of motion.  Lymphadenopathy:     Cervical: No cervical adenopathy.  Skin:    General: Skin is warm and dry.     Findings: No rash.  Neurological:     Mental Status: He is alert and oriented to person, place, and time.     Cranial Nerves: No cranial nerve deficit.     Motor: No abnormal muscle tone.     Coordination: Coordination normal.     Gait: Gait normal.     Deep Tendon Reflexes: Reflexes are normal and symmetric.  Psychiatric:        Behavior: Behavior normal.        Thought Content: Thought content normal.        Judgment: Judgment normal.   Wax L ear  Lab Results  Component Value Date   WBC 6.4 12/29/2017   HGB 15.1 12/29/2017   HCT 44.2 12/29/2017   PLT 160.0 12/29/2017   GLUCOSE 154 (H) 05/01/2021   CHOL 113 12/29/2017   TRIG 335.0 (H) 12/29/2017   HDL 31.80 (L) 12/29/2017   LDLDIRECT 38.0 12/29/2017   LDLCALC 15 10/18/2013   ALT 31 05/01/2021   AST 19 05/01/2021   NA 137 05/01/2021   K 4.1 05/01/2021   CL 104 05/01/2021   CREATININE 0.79 05/01/2021   BUN 14 05/01/2021   CO2 27 05/01/2021   TSH 1.88 12/29/2017   PSA 0.32 12/29/2017   INR 0.9 05/22/2016   HGBA1C 7.5 (H) 05/01/2021    No results found.  Assessment & Plan:   Problem List Items Addressed This Visit     CAD (coronary artery disease)    Cont on Lipitor, ASA, Imdur No CP      Cerumen impaction    L ear Use gtt at home, then irrigate      Diabetes  mellitus type 2 in obese (HCC)     Metformin 500 mg/d      Morbid obesity ([place])    Wt Readings from Last 3 Encounters:  01/30/22 285 lb 12.8 oz (129.6 kg)  10/29/21 284 lb (128.8 kg)  07/26/21 284 lb (128.8 kg)  On  diet      MYOCARDIAL INFARCTION, HX OF      No orders of the defined types were placed in this encounter.     Follow-up: Return in about 3 months (around 05/01/2022) for a follow-up visit.  Walker Kehr, MD

## 2022-03-20 ENCOUNTER — Telehealth: Payer: Self-pay | Admitting: Internal Medicine

## 2022-03-20 NOTE — Telephone Encounter (Signed)
Patient called and said he has a ste on his eye and its swollen, he said he went by the pharmacy and they told him if it hadnt went down in a certain amount of time to have his PCP call an antibiotic in. I offered to schedule him an appointment but he said he would just like Dr Alain Marion to call something in for him and that he's done it in the past. Please advise

## 2022-03-21 MED ORDER — DOXYCYCLINE HYCLATE 100 MG PO TABS: 100.0000 mg | ORAL_TABLET | Freq: Two times a day (BID) | ORAL | 1 refills | Status: DC

## 2022-03-21 NOTE — Telephone Encounter (Signed)
I will send the antibiotic in.  See me if not better.  Thanks

## 2022-03-21 NOTE — Telephone Encounter (Signed)
Notified pt w/MD response.../lmb 

## 2022-03-25 ENCOUNTER — Other Ambulatory Visit: Payer: Self-pay | Admitting: Internal Medicine

## 2022-04-02 DIAGNOSIS — H6122 Impacted cerumen, left ear: Secondary | ICD-10-CM | POA: Diagnosis not present

## 2022-04-02 DIAGNOSIS — H903 Sensorineural hearing loss, bilateral: Secondary | ICD-10-CM | POA: Diagnosis not present

## 2022-05-01 ENCOUNTER — Ambulatory Visit (INDEPENDENT_AMBULATORY_CARE_PROVIDER_SITE_OTHER): Payer: BC Managed Care – PPO | Admitting: Internal Medicine

## 2022-05-01 ENCOUNTER — Encounter: Payer: Self-pay | Admitting: Internal Medicine

## 2022-05-01 VITALS — BP 126/78 | HR 41 | Temp 97.8°F | Ht 72.0 in | Wt 290.0 lb

## 2022-05-01 DIAGNOSIS — F419 Anxiety disorder, unspecified: Secondary | ICD-10-CM | POA: Diagnosis not present

## 2022-05-01 DIAGNOSIS — F17200 Nicotine dependence, unspecified, uncomplicated: Secondary | ICD-10-CM | POA: Diagnosis not present

## 2022-05-01 DIAGNOSIS — I209 Angina pectoris, unspecified: Secondary | ICD-10-CM

## 2022-05-01 DIAGNOSIS — M5412 Radiculopathy, cervical region: Secondary | ICD-10-CM

## 2022-05-01 DIAGNOSIS — E669 Obesity, unspecified: Secondary | ICD-10-CM

## 2022-05-01 DIAGNOSIS — J209 Acute bronchitis, unspecified: Secondary | ICD-10-CM

## 2022-05-01 DIAGNOSIS — E1169 Type 2 diabetes mellitus with other specified complication: Secondary | ICD-10-CM

## 2022-05-01 DIAGNOSIS — I25118 Atherosclerotic heart disease of native coronary artery with other forms of angina pectoris: Secondary | ICD-10-CM

## 2022-05-01 MED ORDER — METFORMIN HCL 500 MG PO TABS: 500.0000 mg | ORAL_TABLET | Freq: Every day | ORAL | 3 refills | Status: DC

## 2022-05-01 MED ORDER — METOPROLOL TARTRATE 25 MG PO TABS: 25.0000 mg | ORAL_TABLET | Freq: Every day | ORAL | 3 refills | Status: DC

## 2022-05-01 MED ORDER — DICLOFENAC SODIUM 75 MG PO TBEC: 75.0000 mg | DELAYED_RELEASE_TABLET | Freq: Two times a day (BID) | ORAL | 1 refills | Status: DC | PRN

## 2022-05-01 MED ORDER — METHYLPREDNISOLONE 4 MG PO TBPK: ORAL_TABLET | ORAL | 0 refills | Status: DC

## 2022-05-01 MED ORDER — ISOSORBIDE MONONITRATE ER 30 MG PO TB24: 30.0000 mg | ORAL_TABLET | Freq: Every day | ORAL | 3 refills | Status: DC

## 2022-05-01 MED ORDER — ALPRAZOLAM 1 MG PO TABS: ORAL_TABLET | ORAL | 1 refills | Status: DC

## 2022-05-01 NOTE — Assessment & Plan Note (Signed)
Cont on Alprazolam prn  Potential benefits of a long term benzodiazepines  use as well as potential risks  and complications were explained to the patient and were aknowledged.

## 2022-05-01 NOTE — Assessment & Plan Note (Signed)
Medrol pac Stop smoking - planning soon Use Symbicort

## 2022-05-01 NOTE — Assessment & Plan Note (Signed)
2 ppd

## 2022-05-01 NOTE — Assessment & Plan Note (Signed)
Check A1c. 

## 2022-05-01 NOTE — Progress Notes (Signed)
Subjective:  Patient ID: [name], male    DOB: [birth year]  Age: 61 y.o. MRN: [id number]  CC: Follow-up   HPI Yosmar Ryker presents for R arm is tingling periodically C/o SOB/DOE; cough - smoking 2 ppd No neck pain, wheezing  Outpatient Medications Prior to Visit  Medication Sig Dispense Refill   aspirin EC 81 MG tablet Take 81 mg by mouth daily. Swallow whole.     budesonide-formoterol (SYMBICORT) 160-4.5 MCG/ACT inhaler INHALE 2 PUFS INTO THE LUNGS TWICE A DAY 10.2 each 11   nitroGLYCERIN (NITROSTAT) 0.4 MG SL tablet Place 1 tablet (0.4 mg total) under the tongue every 5 (five) minutes as needed for chest pain. 20 tablet 3   ALPRAZolam (XANAX) 1 MG tablet TAKE 1 TABLET BY MOUTH 2 TIMES DAILY AS NEEDED. 60 tablet 1   diclofenac (VOLTAREN) 75 MG EC tablet Take 75 mg by mouth 2 (two) times daily as needed.     isosorbide mononitrate (IMDUR) 30 MG 24 hr tablet Take 1 tablet (30 mg total) by mouth daily. 90 tablet 3   metoprolol tartrate (LOPRESSOR) 25 MG tablet Take 25 mg by mouth daily.     doxycycline (VIBRA-TABS) 100 MG tablet Take 1 tablet (100 mg total) by mouth 2 (two) times daily. (Patient not taking: Reported on 05/01/2022) 14 tablet 1   metFORMIN (GLUCOPHAGE) 500 MG tablet Take 1 tablet (500 mg total) by mouth daily with breakfast. (Patient not taking: Reported on 05/01/2022) 90 tablet 3   No facility-administered medications prior to visit.    ROS: Review of Systems  Constitutional:  Negative for appetite change, fatigue and unexpected weight change.  HENT:  Positive for hearing loss. Negative for congestion, nosebleeds, sneezing, sore throat and trouble swallowing.   Eyes:  Negative for itching and visual disturbance.  Respiratory:  Positive for cough and shortness of breath.   Cardiovascular:  Negative for chest pain, palpitations and leg swelling.  Gastrointestinal:  Negative for abdominal distention, blood in stool, diarrhea and nausea.   Genitourinary:  Negative for frequency and hematuria.  Musculoskeletal:  Negative for back pain, gait problem, joint swelling and neck pain.  Skin:  Negative for rash.  Neurological:  Negative for dizziness, tremors, speech difficulty and weakness.  Psychiatric/Behavioral:  Negative for agitation, dysphoric mood, sleep disturbance and suicidal ideas. The patient is nervous/anxious.     Objective:  BP 126/78 (BP Location: Right Arm, Patient Position: Sitting, Cuff Size: Normal)   Pulse (!) 41   Temp 97.8 F (36.6 C) (Oral)   Ht 6' (1.829 m)   Wt 290 lb (131.5 kg)   SpO2 95%   BMI 39.33 kg/m   BP Readings from Last 3 Encounters:  05/01/22 126/78  01/30/22 (!) 140/82  11/30/21 (!) 163/93    Wt Readings from Last 3 Encounters:  05/01/22 290 lb (131.5 kg)  01/30/22 285 lb 12.8 oz (129.6 kg)  10/29/21 284 lb (128.8 kg)    Physical Exam Constitutional:      General: He is not in acute distress.    Appearance: He is well-developed. He is obese.     Comments: NAD  Eyes:     Conjunctiva/sclera: Conjunctivae normal.     Pupils: Pupils are equal, round, and reactive to light.  Neck:     Thyroid: No thyromegaly.     Vascular: No JVD.  Cardiovascular:     Rate and Rhythm: Normal rate and regular rhythm.     Heart sounds: Normal heart sounds. No  murmur heard.    No friction rub. No gallop.  Pulmonary:     Effort: Pulmonary effort is normal. No respiratory distress.     Breath sounds: Normal breath sounds. No wheezing or rales.  Chest:     Chest wall: No tenderness.  Abdominal:     General: Bowel sounds are normal. There is no distension.     Palpations: Abdomen is soft. There is no mass.     Tenderness: There is no abdominal tenderness. There is no guarding or rebound.  Musculoskeletal:        General: No tenderness. Normal range of motion.     Cervical back: Normal range of motion.  Lymphadenopathy:     Cervical: No cervical adenopathy.  Skin:    General: Skin is warm  and dry.     Findings: No rash.  Neurological:     Mental Status: He is alert and oriented to person, place, and time.     Cranial Nerves: No cranial nerve deficit.     Motor: No abnormal muscle tone.     Coordination: Coordination normal.     Gait: Gait normal.     Deep Tendon Reflexes: Reflexes are normal and symmetric.  Psychiatric:        Behavior: Behavior normal.        Thought Content: Thought content normal.        Judgment: Judgment normal.     Lab Results  Component Value Date   WBC 6.4 12/29/2017   HGB 15.1 12/29/2017   HCT 44.2 12/29/2017   PLT 160.0 12/29/2017   GLUCOSE 154 (H) 05/01/2021   CHOL 113 12/29/2017   TRIG 335.0 (H) 12/29/2017   HDL 31.80 (L) 12/29/2017   LDLDIRECT 38.0 12/29/2017   LDLCALC 15 10/18/2013   ALT 31 05/01/2021   AST 19 05/01/2021   NA 137 05/01/2021   K 4.1 05/01/2021   CL 104 05/01/2021   CREATININE 0.79 05/01/2021   BUN 14 05/01/2021   CO2 27 05/01/2021   TSH 1.88 12/29/2017   PSA 0.32 12/29/2017   INR 0.9 05/22/2016   HGBA1C 7.5 (H) 05/01/2021    No results found.  Assessment & Plan:   Problem List Items Addressed This Visit     Tobacco dependence - Primary    2 ppd      Diabetes mellitus type 2 in obese (HCC)    Check A1c      Relevant Medications   metFORMIN (GLUCOPHAGE) 500 MG tablet   Cervical radiculopathy    R arm paresthesias Medrol pack      Relevant Medications   ALPRAZolam (XANAX) 1 MG tablet   CAD (coronary artery disease)    Cont on Lipitor, ASA, Imdur      Relevant Medications   isosorbide mononitrate (IMDUR) 30 MG 24 hr tablet   metoprolol tartrate (LOPRESSOR) 25 MG tablet   Anxiety disorder    Cont on Alprazolam prn  Potential benefits of a long term benzodiazepines  use as well as potential risks  and complications were explained to the patient and were aknowledged.      Relevant Medications   ALPRAZolam (XANAX) 1 MG tablet   Angina pectoris, unspecified (HCC)    No CP in weeks On  Isosorbide      Relevant Medications   isosorbide mononitrate (IMDUR) 30 MG 24 hr tablet   metoprolol tartrate (LOPRESSOR) 25 MG tablet   Acute bronchitis    Medrol pac Stop smoking - planning soon Use  Symbicort         Meds ordered this encounter  Medications   ALPRAZolam (XANAX) 1 MG tablet    Sig: TAKE 1 TABLET BY MOUTH 2 TIMES DAILY AS NEEDED.    Dispense:  60 tablet    Refill:  1    Not to exceed 4 additional fills before 04/27/2022.   diclofenac (VOLTAREN) 75 MG EC tablet    Sig: Take 1 tablet (75 mg total) by mouth 2 (two) times daily as needed. Take 75 mg by mouth 2 (two) times daily as needed.    Dispense:  180 tablet    Refill:  1   isosorbide mononitrate (IMDUR) 30 MG 24 hr tablet    Sig: Take 1 tablet (30 mg total) by mouth daily.    Dispense:  90 tablet    Refill:  3   metFORMIN (GLUCOPHAGE) 500 MG tablet    Sig: Take 1 tablet (500 mg total) by mouth daily with breakfast.    Dispense:  90 tablet    Refill:  3   metoprolol tartrate (LOPRESSOR) 25 MG tablet    Sig: Take 1 tablet (25 mg total) by mouth daily. Take 25 mg by mouth daily.    Dispense:  90 tablet    Refill:  3   methylPREDNISolone (MEDROL DOSEPAK) 4 MG TBPK tablet    Sig: As directed    Dispense:  21 tablet    Refill:  0      Follow-up: Return in about 3 months (around 07/31/2022) for a follow-up visit.  Walker Kehr, MD

## 2022-05-01 NOTE — Assessment & Plan Note (Signed)
No CP in weeks On Isosorbide

## 2022-05-01 NOTE — Assessment & Plan Note (Signed)
Cont on Lipitor, ASA, Imdur

## 2022-05-01 NOTE — Assessment & Plan Note (Addendum)
R arm paresthesias Medrol pack

## 2022-06-12 ENCOUNTER — Other Ambulatory Visit (INDEPENDENT_AMBULATORY_CARE_PROVIDER_SITE_OTHER): Payer: 59

## 2022-06-12 DIAGNOSIS — E1169 Type 2 diabetes mellitus with other specified complication: Secondary | ICD-10-CM | POA: Diagnosis not present

## 2022-06-12 DIAGNOSIS — E669 Obesity, unspecified: Secondary | ICD-10-CM

## 2022-06-12 DIAGNOSIS — E785 Hyperlipidemia, unspecified: Secondary | ICD-10-CM | POA: Diagnosis not present

## 2022-06-12 LAB — COMPREHENSIVE METABOLIC PANEL
ALT: 36 U/L (ref 0–53)
AST: 20 U/L (ref 0–37)
Albumin: 4.5 g/dL (ref 3.5–5.2)
Alkaline Phosphatase: 55 U/L (ref 39–117)
BUN: 15 mg/dL (ref 6–23)
CO2: 28 mEq/L (ref 19–32)
Calcium: 9 mg/dL (ref 8.4–10.5)
Chloride: 104 mEq/L (ref 96–112)
Creatinine, Ser: 0.88 mg/dL (ref 0.40–1.50)
GFR: 93.72 mL/min (ref >60.00)
Glucose, Bld: 97 mg/dL (ref 70–99)
Potassium: 4.2 mEq/L (ref 3.5–5.1)
Sodium: 139 mEq/L (ref 135–145)
Total Bilirubin: 0.9 mg/dL (ref 0.2–1.2)
Total Protein: 6.9 g/dL (ref 6.0–8.3)

## 2022-06-12 LAB — HEMOGLOBIN A1C: Hgb A1c MFr Bld: 6.7 % — ABNORMAL HIGH (ref 4.6–6.5)

## 2022-06-12 LAB — TSH: TSH: 2.32 u[IU]/mL (ref 0.35–5.50)

## 2022-07-31 ENCOUNTER — Encounter: Payer: Self-pay | Admitting: Internal Medicine

## 2022-07-31 ENCOUNTER — Ambulatory Visit: Payer: 59 | Admitting: Internal Medicine

## 2022-07-31 VITALS — BP 160/98 | HR 67 | Temp 98.7°F | Ht 72.0 in | Wt 290.0 lb

## 2022-07-31 DIAGNOSIS — F419 Anxiety disorder, unspecified: Secondary | ICD-10-CM | POA: Diagnosis not present

## 2022-07-31 DIAGNOSIS — M545 Low back pain, unspecified: Secondary | ICD-10-CM | POA: Diagnosis not present

## 2022-07-31 DIAGNOSIS — E1169 Type 2 diabetes mellitus with other specified complication: Secondary | ICD-10-CM | POA: Diagnosis not present

## 2022-07-31 DIAGNOSIS — M79604 Pain in right leg: Secondary | ICD-10-CM

## 2022-07-31 DIAGNOSIS — E669 Obesity, unspecified: Secondary | ICD-10-CM | POA: Diagnosis not present

## 2022-07-31 DIAGNOSIS — R7309 Other abnormal glucose: Secondary | ICD-10-CM

## 2022-07-31 DIAGNOSIS — Z0000 Encounter for general adult medical examination without abnormal findings: Secondary | ICD-10-CM

## 2022-07-31 MED ORDER — ALPRAZOLAM 1 MG PO TABS: ORAL_TABLET | ORAL | 1 refills | Status: DC

## 2022-07-31 MED ORDER — OZEMPIC (0.25 OR 0.5 MG/DOSE) 2 MG/3ML ~~LOC~~ SOPN: PEN_INJECTOR | SUBCUTANEOUS | 1 refills | Status: DC

## 2022-07-31 NOTE — Assessment & Plan Note (Signed)
Chronic Cont on Alprazolam prn  Potential benefits of a long term benzodiazepines  use as well as potential risks  and complications were explained to the patient and were aknowledged.

## 2022-07-31 NOTE — Assessment & Plan Note (Signed)
Chronic  Start Ozempic q 1 week 12/22

## 2022-07-31 NOTE — Assessment & Plan Note (Signed)
Will try wt loss - Ozempic Rx

## 2022-07-31 NOTE — Assessment & Plan Note (Signed)
Start Ozempic 0.25 mg q 1 week x 2 wks, then go to 0.5 mg - will try again On Metformin 500 mg/d

## 2022-07-31 NOTE — Progress Notes (Signed)
Subjective:  Patient ID: [name], male    DOB: [birth year]  Age: 62 y.o. MRN: [id number]  CC: Follow-up (3 MNTH F/U)   HPI [name] presents for COPD, anxiety,  DM  Outpatient Medications Prior to Visit  Medication Sig Dispense Refill   aspirin EC 81 MG tablet Take 81 mg by mouth daily. Swallow whole.     budesonide-formoterol (SYMBICORT) 160-4.5 MCG/ACT inhaler INHALE 2 PUFS INTO THE LUNGS TWICE A DAY 10.2 each 11   diclofenac (VOLTAREN) 75 MG EC tablet Take 1 tablet (75 mg total) by mouth 2 (two) times daily as needed. Take 75 mg by mouth 2 (two) times daily as needed. 180 tablet 1   isosorbide mononitrate (IMDUR) 30 MG 24 hr tablet Take 1 tablet (30 mg total) by mouth daily. 90 tablet 3   metFORMIN (GLUCOPHAGE) 500 MG tablet Take 1 tablet (500 mg total) by mouth daily with breakfast. 90 tablet 3   methylPREDNISolone (MEDROL DOSEPAK) 4 MG TBPK tablet As directed 21 tablet 0   metoprolol tartrate (LOPRESSOR) 25 MG tablet Take 1 tablet (25 mg total) by mouth daily. Take 25 mg by mouth daily. 90 tablet 3   nitroGLYCERIN (NITROSTAT) 0.4 MG SL tablet Place 1 tablet (0.4 mg total) under the tongue every 5 (five) minutes as needed for chest pain. 20 tablet 3   ALPRAZolam (XANAX) 1 MG tablet TAKE 1 TABLET BY MOUTH 2 TIMES DAILY AS NEEDED. 60 tablet 1   No facility-administered medications prior to visit.    ROS: Review of Systems  Constitutional:  Negative for appetite change, fatigue and unexpected weight change.  HENT:  Negative for congestion, nosebleeds, sneezing, sore throat and trouble swallowing.   Eyes:  Negative for itching and visual disturbance.  Respiratory:  Negative for cough.   Cardiovascular:  Negative for chest pain, palpitations and leg swelling.  Gastrointestinal:  Negative for abdominal distention, blood in stool, diarrhea and nausea.  Genitourinary:  Negative for frequency and hematuria.  Musculoskeletal:  Negative for back pain,  gait problem, joint swelling and neck pain.  Skin:  Negative for rash.  Neurological:  Negative for dizziness, tremors, speech difficulty and weakness.  Psychiatric/Behavioral:  Negative for agitation, dysphoric mood, sleep disturbance and suicidal ideas. The patient is nervous/anxious.     Objective:  BP (!) 160/98 (BP Location: Right Arm, Patient Position: Sitting, Cuff Size: Normal)   Pulse 67   Temp 98.7 F (37.1 C) (Oral)   Ht 6' (1.829 m)   Wt 290 lb (131.5 kg)   SpO2 95%   BMI 39.33 kg/m   BP Readings from Last 3 Encounters:  07/31/22 (!) 160/98  05/01/22 126/78  01/30/22 (!) 140/82    Wt Readings from Last 3 Encounters:  07/31/22 290 lb (131.5 kg)  05/01/22 290 lb (131.5 kg)  01/30/22 285 lb 12.8 oz (129.6 kg)    Physical Exam Constitutional:      General: He is not in acute distress.    Appearance: He is well-developed.     Comments: NAD  Eyes:     Conjunctiva/sclera: Conjunctivae normal.     Pupils: Pupils are equal, round, and reactive to light.  Neck:     Thyroid: No thyromegaly.     Vascular: No JVD.  Cardiovascular:     Rate and Rhythm: Normal rate and regular rhythm.     Heart sounds: Normal heart sounds. No murmur heard.    No friction rub. No gallop.  Pulmonary:  Effort: Pulmonary effort is normal. No respiratory distress.     Breath sounds: Normal breath sounds. No wheezing or rales.  Chest:     Chest wall: No tenderness.  Abdominal:     General: Bowel sounds are normal. There is no distension.     Palpations: Abdomen is soft. There is no mass.     Tenderness: There is no abdominal tenderness. There is no guarding or rebound.  Musculoskeletal:        General: No tenderness. Normal range of motion.     Cervical back: Normal range of motion.  Lymphadenopathy:     Cervical: No cervical adenopathy.  Skin:    General: Skin is warm and dry.     Findings: No rash.  Neurological:     Mental Status: He is alert and oriented to person, place,  and time.     Cranial Nerves: No cranial nerve deficit.     Motor: No abnormal muscle tone.     Coordination: Coordination normal.     Gait: Gait normal.     Deep Tendon Reflexes: Reflexes are normal and symmetric.  Psychiatric:        Behavior: Behavior normal.        Thought Content: Thought content normal.        Judgment: Judgment normal.     Lab Results  Component Value Date   WBC 6.4 12/29/2017   HGB 15.1 12/29/2017   HCT 44.2 12/29/2017   PLT 160.0 12/29/2017   GLUCOSE 97 06/12/2022   CHOL 113 12/29/2017   TRIG 335.0 (H) 12/29/2017   HDL 31.80 (L) 12/29/2017   LDLDIRECT 38.0 12/29/2017   LDLCALC 15 10/18/2013   ALT 36 06/12/2022   AST 20 06/12/2022   NA 139 06/12/2022   K 4.2 06/12/2022   CL 104 06/12/2022   CREATININE 0.88 06/12/2022   BUN 15 06/12/2022   CO2 28 06/12/2022   TSH 2.32 06/12/2022   PSA 0.32 12/29/2017   INR 0.9 05/22/2016   HGBA1C 6.7 (H) 06/12/2022    No results found.  Assessment & Plan:   Problem List Items Addressed This Visit       Endocrine   Diabetes mellitus type 2 in obese ([place])    Start Ozempic 0.25 mg q 1 week x 2 wks, then go to 0.5 mg - will try again On Metformin 500 mg/d      Relevant Medications   Semaglutide,0.25 or 0.5MG /DOS, (OZEMPIC, 0.25 OR 0.5 MG/DOSE,) 2 MG/3ML SOPN   Other Relevant Orders   Hemoglobin A1c     Other   Well adult exam   Relevant Orders   TSH   Urinalysis   CBC with Differential/Platelet   Lipid panel   PSA   Comprehensive metabolic panel   Low back pain radiating to right leg    Will try wt loss - Ozempic Rx      Elevated glucose    Chronic  Start Ozempic q 1 week 12/22      Anxiety disorder - Primary    Chronic Cont on Alprazolam prn  Potential benefits of a long term benzodiazepines  use as well as potential risks  and complications were explained to the patient and were aknowledged.      Relevant Medications   ALPRAZolam (XANAX) 1 MG tablet      Meds ordered this  encounter  Medications   Semaglutide,0.25 or 0.5MG /DOS, (OZEMPIC, 0.25 OR 0.5 MG/DOSE,) 2 MG/3ML SOPN    Sig: Use 0.25  mg weekly sq for 1 month, then 0.5 mg sq weekly    Dispense:  9 mL    Refill:  1   ALPRAZolam (XANAX) 1 MG tablet    Sig: TAKE 1 TABLET BY MOUTH 2 TIMES DAILY AS NEEDED.    Dispense:  60 tablet    Refill:  1      Follow-up: Return in about 3 months (around 10/31/2022) for Wellness Exam.  Walker Kehr, MD

## 2022-08-01 ENCOUNTER — Telehealth: Payer: Self-pay | Admitting: *Deleted

## 2022-08-01 NOTE — Telephone Encounter (Signed)
Pt need PA on Ozempic submitted w/ (Key: [id number]). Your PA request has been sent to [place].Marland KitchenJohny Chess

## 2022-08-02 NOTE — Telephone Encounter (Signed)
Rec'd determination med was DENIED. It states not medical necessary.a) adequate trial and failure for at least 90 days or are unable to use metformin 1500 milligrams daily or b) have an A1c (a blood test that is an indicator of blood sugar levels over the past 3 months) of 7.5 percent or, greater and require combination therapy.Marland KitchenJohny Chess

## 2022-08-03 NOTE — Telephone Encounter (Signed)
[name] was supposed to do his blood work when he saw me this week.  Did he do it?  Thank you

## 2022-08-05 NOTE — Telephone Encounter (Signed)
No he did not do labs.Marland KitchenJohny Chandler

## 2022-08-06 NOTE — Telephone Encounter (Signed)
[name] can do his labs at his convenience.  Thank you

## 2022-08-07 NOTE — Telephone Encounter (Signed)
Notified pt w/ MD response. Pt will come tomorrow to do labs. He states he would like to APPEAL PA for Ozempic. He will get fax # from his insurance and bring tomorrow. Will need letter.Marland KitchenJohny Chess

## 2022-08-08 ENCOUNTER — Other Ambulatory Visit (INDEPENDENT_AMBULATORY_CARE_PROVIDER_SITE_OTHER): Payer: 59

## 2022-08-08 DIAGNOSIS — Z0000 Encounter for general adult medical examination without abnormal findings: Secondary | ICD-10-CM | POA: Diagnosis not present

## 2022-08-08 DIAGNOSIS — E669 Obesity, unspecified: Secondary | ICD-10-CM | POA: Diagnosis not present

## 2022-08-08 DIAGNOSIS — E1169 Type 2 diabetes mellitus with other specified complication: Secondary | ICD-10-CM

## 2022-08-08 LAB — PSA: PSA: 0.69 ng/mL (ref 0.10–4.00)

## 2022-08-08 LAB — URINALYSIS, ROUTINE W REFLEX MICROSCOPIC
Hgb urine dipstick: NEGATIVE
Ketones, ur: NEGATIVE
Nitrite: NEGATIVE
RBC / HPF: NONE SEEN (ref 0–?)
Specific Gravity, Urine: >=1.03 — AB (ref 1.000–1.030)
Total Protein, Urine: 30 — AB
Urine Glucose: 100 — AB
Urobilinogen, UA: 0.2 (ref 0.0–1.0)
pH: 6 (ref 5.0–8.0)

## 2022-08-08 LAB — CBC WITH DIFFERENTIAL/PLATELET
Basophils Absolute: 0 10*3/uL (ref 0.0–0.1)
Basophils Relative: 0.5 % (ref 0.0–3.0)
Eosinophils Absolute: 0.2 10*3/uL (ref 0.0–0.7)
Eosinophils Relative: 2.6 % (ref 0.0–5.0)
HCT: 49.8 % (ref 39.0–52.0)
Hemoglobin: 17.4 g/dL — ABNORMAL HIGH (ref 13.0–17.0)
Lymphocytes Relative: 32 % (ref 12.0–46.0)
Lymphs Abs: 2 10*3/uL (ref 0.7–4.0)
MCHC: 34.9 g/dL (ref 30.0–36.0)
MCV: 90.8 fl (ref 78.0–100.0)
Monocytes Absolute: 0.6 10*3/uL (ref 0.1–1.0)
Monocytes Relative: 9.5 % (ref 3.0–12.0)
Neutro Abs: 3.5 10*3/uL (ref 1.4–7.7)
Neutrophils Relative %: 55.4 % (ref 43.0–77.0)
Platelets: 125 10*3/uL — ABNORMAL LOW (ref 150.0–400.0)
RBC: 5.48 Mil/uL (ref 4.22–5.81)
RDW: 13.2 % (ref 11.5–15.5)
WBC: 6.4 10*3/uL (ref 4.0–10.5)

## 2022-08-08 LAB — COMPREHENSIVE METABOLIC PANEL
ALT: 36 U/L (ref 0–53)
AST: 18 U/L (ref 0–37)
Albumin: 4.5 g/dL (ref 3.5–5.2)
Alkaline Phosphatase: 63 U/L (ref 39–117)
BUN: 14 mg/dL (ref 6–23)
CO2: 29 mEq/L (ref 19–32)
Calcium: 9.2 mg/dL (ref 8.4–10.5)
Chloride: 105 mEq/L (ref 96–112)
Creatinine, Ser: 0.81 mg/dL (ref 0.40–1.50)
GFR: 95.99 mL/min (ref >60.00)
Glucose, Bld: 137 mg/dL — ABNORMAL HIGH (ref 70–99)
Potassium: 4.8 mEq/L (ref 3.5–5.1)
Sodium: 140 mEq/L (ref 135–145)
Total Bilirubin: 1.1 mg/dL (ref 0.2–1.2)
Total Protein: 6.7 g/dL (ref 6.0–8.3)

## 2022-08-08 LAB — LIPID PANEL
Cholesterol: 195 mg/dL (ref 0–200)
HDL: 37.2 mg/dL — ABNORMAL LOW (ref >39.00)
NonHDL: 158.28
Total CHOL/HDL Ratio: 5
Triglycerides: 285 mg/dL — ABNORMAL HIGH (ref 0.0–149.0)
VLDL: 57 mg/dL — ABNORMAL HIGH (ref 0.0–40.0)

## 2022-08-08 LAB — TSH: TSH: 1.87 u[IU]/mL (ref 0.35–5.50)

## 2022-08-08 LAB — HEMOGLOBIN A1C: Hgb A1c MFr Bld: 6.9 % — ABNORMAL HIGH (ref 4.6–6.5)

## 2022-08-08 LAB — LDL CHOLESTEROL, DIRECT: Direct LDL: 91 mg/dL

## 2022-08-08 NOTE — Telephone Encounter (Signed)
OK - Dx DM-2 Thx

## 2022-08-09 MED ORDER — CIPROFLOXACIN HCL 500 MG PO TABS: 500.0000 mg | ORAL_TABLET | Freq: Two times a day (BID) | ORAL | 0 refills | Status: AC

## 2022-08-09 NOTE — Progress Notes (Signed)
Antibiotic for possible UTIs or prostatitis

## 2022-08-13 MED ORDER — ONETOUCH ULTRASOFT 2 LANCETS MISC: 1.0000 | Freq: Two times a day (BID) | 3 refills | Status: DC

## 2022-08-13 MED ORDER — ONETOUCH ULTRA 2 W/DEVICE KIT: PACK | 0 refills | Status: DC

## 2022-08-13 MED ORDER — ONETOUCH ULTRA VI STRP: ORAL_STRIP | 12 refills | Status: DC

## 2022-08-13 NOTE — Telephone Encounter (Signed)
Appeal letter written. Faxed to [employer] [address] @ [phone]. Notified pt Appeal letter has been faxed. Can take up to 30 days for approval../lmb

## 2022-09-18 NOTE — Telephone Encounter (Signed)
Patient Advocate Encounter  Received a fax from [place] regarding Prior [employer] for [employer].   Appeal has been DENIED due to    Determination letter attached to patient chart

## 2022-09-22 NOTE — Telephone Encounter (Signed)
Noted.  Keep return office visit.  Thank you

## 2022-10-31 ENCOUNTER — Encounter: Payer: Self-pay | Admitting: Internal Medicine

## 2022-10-31 ENCOUNTER — Ambulatory Visit (INDEPENDENT_AMBULATORY_CARE_PROVIDER_SITE_OTHER): Payer: 59 | Admitting: Internal Medicine

## 2022-10-31 VITALS — BP 130/78 | HR 68 | Temp 98.0°F | Ht 72.0 in | Wt 283.0 lb

## 2022-10-31 DIAGNOSIS — I10 Essential (primary) hypertension: Secondary | ICD-10-CM | POA: Diagnosis not present

## 2022-10-31 DIAGNOSIS — R7309 Other abnormal glucose: Secondary | ICD-10-CM

## 2022-10-31 DIAGNOSIS — I25118 Atherosclerotic heart disease of native coronary artery with other forms of angina pectoris: Secondary | ICD-10-CM

## 2022-10-31 DIAGNOSIS — R0602 Shortness of breath: Secondary | ICD-10-CM

## 2022-10-31 DIAGNOSIS — G4733 Obstructive sleep apnea (adult) (pediatric): Secondary | ICD-10-CM

## 2022-10-31 MED ORDER — METOPROLOL TARTRATE 25 MG PO TABS: 25.0000 mg | ORAL_TABLET | Freq: Every day | ORAL | 3 refills | Status: DC

## 2022-10-31 MED ORDER — ISOSORBIDE MONONITRATE ER 30 MG PO TB24: 30.0000 mg | ORAL_TABLET | Freq: Every day | ORAL | 3 refills | Status: DC

## 2022-10-31 NOTE — Assessment & Plan Note (Signed)
Will sch w/Dr Craige Cotta

## 2022-10-31 NOTE — Assessment & Plan Note (Signed)
3/23 Chronic.  [name] is trying to lose weight on diet.  We will try to PA Ozempic again

## 2022-10-31 NOTE — Progress Notes (Signed)
Subjective:  Patient ID: [name], male    DOB: [birth year]  Age: 62 y.o. MRN: [id number]  CC: Follow-up (3 MNTH F/U)   HPI [name] presents for LBP, COPD, CAD, OSA  Outpatient Medications Prior to Visit  Medication Sig Dispense Refill   ALPRAZolam (XANAX) 1 MG tablet TAKE 1 TABLET BY MOUTH 2 TIMES DAILY AS NEEDED. 60 tablet 1   aspirin EC 81 MG tablet Take 81 mg by mouth daily. Swallow whole.     Blood Glucose Monitoring Suppl (ONE TOUCH ULTRA 2) w/Device KIT Use to check blood sugars twice a day 1 kit 0   budesonide-formoterol (SYMBICORT) 160-4.5 MCG/ACT inhaler INHALE 2 PUFS INTO THE LUNGS TWICE A DAY 10.2 each 11   diclofenac (VOLTAREN) 75 MG EC tablet Take 1 tablet (75 mg total) by mouth 2 (two) times daily as needed. Take 75 mg by mouth 2 (two) times daily as needed. 180 tablet 1   glucose blood (ONETOUCH ULTRA) test strip Use to check blood sugars twice a day 100 each 12   metFORMIN (GLUCOPHAGE) 500 MG tablet Take 1 tablet (500 mg total) by mouth daily with breakfast. 90 tablet 3   methylPREDNISolone (MEDROL DOSEPAK) 4 MG TBPK tablet As directed 21 tablet 0   nitroGLYCERIN (NITROSTAT) 0.4 MG SL tablet Place 1 tablet (0.4 mg total) under the tongue every 5 (five) minutes as needed for chest pain. 20 tablet 3   OneTouch UltraSoft 2 Lancets MISC 1 Lancet by Does not apply route 2 (two) times daily. 100 each 3   isosorbide mononitrate (IMDUR) 30 MG 24 hr tablet Take 1 tablet (30 mg total) by mouth daily. 90 tablet 3   metoprolol tartrate (LOPRESSOR) 25 MG tablet Take 1 tablet (25 mg total) by mouth daily. Take 25 mg by mouth daily. 90 tablet 3   Semaglutide,0.25 or 0.5MG /DOS, (OZEMPIC, 0.25 OR 0.5 MG/DOSE,) 2 MG/3ML SOPN Use 0.25 mg weekly sq for 1 month, then 0.5 mg sq weekly 9 mL 1   No facility-administered medications prior to visit.    ROS: Review of Systems  Constitutional:  Negative for appetite change, fatigue and unexpected weight change.   HENT:  Negative for congestion, nosebleeds, sneezing, sore throat and trouble swallowing.   Eyes:  Negative for itching and visual disturbance.  Respiratory:  Negative for cough.   Cardiovascular:  Negative for chest pain, palpitations and leg swelling.  Gastrointestinal:  Negative for abdominal distention, blood in stool, diarrhea and nausea.  Genitourinary:  Negative for frequency and hematuria.  Musculoskeletal:  Positive for arthralgias and back pain. Negative for gait problem, joint swelling and neck pain.  Skin:  Negative for rash.  Neurological:  Negative for dizziness, tremors, speech difficulty and weakness.  Psychiatric/Behavioral:  Negative for agitation, dysphoric mood and sleep disturbance. The patient is nervous/anxious.     Objective:  BP 130/78 (BP Location: Right Arm, Patient Position: Sitting, Cuff Size: Normal)   Pulse 68   Temp 98 F (36.7 C) (Oral)   Ht 6' (1.829 m)   Wt 283 lb (128.4 kg)   SpO2 95%   BMI 38.38 kg/m   BP Readings from Last 3 Encounters:  10/31/22 130/78  07/31/22 (!) 160/98  05/01/22 126/78    Wt Readings from Last 3 Encounters:  10/31/22 283 lb (128.4 kg)  07/31/22 290 lb (131.5 kg)  05/01/22 290 lb (131.5 kg)    Physical Exam Constitutional:      General: He is not in acute distress.  Appearance: He is well-developed.     Comments: NAD  Eyes:     Conjunctiva/sclera: Conjunctivae normal.     Pupils: Pupils are equal, round, and reactive to light.  Neck:     Thyroid: No thyromegaly.     Vascular: No JVD.  Cardiovascular:     Rate and Rhythm: Normal rate and regular rhythm.     Heart sounds: Normal heart sounds. No murmur heard.    No friction rub. No gallop.  Pulmonary:     Effort: Pulmonary effort is normal. No respiratory distress.     Breath sounds: Normal breath sounds. No wheezing or rales.  Chest:     Chest wall: No tenderness.  Abdominal:     General: Bowel sounds are normal. There is no distension.      Palpations: Abdomen is soft. There is no mass.     Tenderness: There is no abdominal tenderness. There is no guarding or rebound.  Musculoskeletal:        General: No tenderness. Normal range of motion.     Cervical back: Normal range of motion.  Lymphadenopathy:     Cervical: No cervical adenopathy.  Skin:    General: Skin is warm and dry.     Findings: No rash.  Neurological:     Mental Status: He is alert and oriented to person, place, and time.     Cranial Nerves: No cranial nerve deficit.     Motor: No abnormal muscle tone.     Coordination: Coordination normal.     Gait: Gait normal.     Deep Tendon Reflexes: Reflexes are normal and symmetric.  Psychiatric:        Behavior: Behavior normal.        Thought Content: Thought content normal.        Judgment: Judgment normal.     Lab Results  Component Value Date   WBC 6.4 08/08/2022   HGB 17.4 (H) 08/08/2022   HCT 49.8 08/08/2022   PLT 125.0 (L) 08/08/2022   GLUCOSE 137 (H) 08/08/2022   CHOL 195 08/08/2022   TRIG 285.0 (H) 08/08/2022   HDL 37.20 (L) 08/08/2022   LDLDIRECT 91.0 08/08/2022   LDLCALC 15 10/18/2013   ALT 36 08/08/2022   AST 18 08/08/2022   NA 140 08/08/2022   K 4.8 08/08/2022   CL 105 08/08/2022   CREATININE 0.81 08/08/2022   BUN 14 08/08/2022   CO2 29 08/08/2022   TSH 1.87 08/08/2022   PSA 0.69 08/08/2022   INR 0.9 05/22/2016   HGBA1C 6.9 (H) 08/08/2022    No results found.  Assessment & Plan:   Problem List Items Addressed This Visit     CAD (coronary artery disease)    Cont on Lipitor, ASA, Imdur      Relevant Medications   isosorbide mononitrate (IMDUR) 30 MG 24 hr tablet   metoprolol tartrate (LOPRESSOR) 25 MG tablet   Other Relevant Orders   Comprehensive metabolic panel   Hemoglobin A1c   Urinalysis   Elevated glucose   Relevant Orders   Hemoglobin A1c   Urinalysis   HTN (hypertension)   Relevant Medications   isosorbide mononitrate (IMDUR) 30 MG 24 hr tablet   metoprolol  tartrate (LOPRESSOR) 25 MG tablet   Other Relevant Orders   Hemoglobin A1c   Morbid obesity (HCC)    3/23 Chronic.  Willeen Cass is trying to lose weight on diet.  We will try to PA Ozempic again      OSA (  obstructive sleep apnea)    Will sch w/Dr Craige Cotta      Relevant Orders   Ambulatory referral to Pulmonology   Shortness of breath - Primary    Doing well         Meds ordered this encounter  Medications   isosorbide mononitrate (IMDUR) 30 MG 24 hr tablet    Sig: Take 1 tablet (30 mg total) by mouth daily.    Dispense:  90 tablet    Refill:  3   metoprolol tartrate (LOPRESSOR) 25 MG tablet    Sig: Take 1 tablet (25 mg total) by mouth daily. Take 25 mg by mouth daily.    Dispense:  90 tablet    Refill:  3      Follow-up: Return in about 3 months (around 01/31/2023) for a follow-up visit.  Sonda Primes, MD

## 2022-10-31 NOTE — Assessment & Plan Note (Signed)
Cont on Lipitor, ASA, Imdur 

## 2022-10-31 NOTE — Assessment & Plan Note (Signed)
Doing well 

## 2022-12-17 ENCOUNTER — Institutional Professional Consult (permissible substitution) (HOSPITAL_BASED_OUTPATIENT_CLINIC_OR_DEPARTMENT_OTHER): Payer: 59 | Admitting: Pulmonary Disease

## 2022-12-24 ENCOUNTER — Encounter (HOSPITAL_BASED_OUTPATIENT_CLINIC_OR_DEPARTMENT_OTHER): Payer: Self-pay | Admitting: Pulmonary Disease

## 2022-12-26 ENCOUNTER — Encounter (INDEPENDENT_AMBULATORY_CARE_PROVIDER_SITE_OTHER): Payer: Self-pay

## 2022-12-28 ENCOUNTER — Inpatient Hospital Stay (HOSPITAL_BASED_OUTPATIENT_CLINIC_OR_DEPARTMENT_OTHER)
Admission: EM | Admit: 2022-12-28 | Discharge: 2023-01-09 | DRG: 234 | Disposition: A | Discharge status: Home / Self Care | Payer: 59 | Attending: Thoracic Surgery (Cardiothoracic Vascular Surgery) | Admitting: Thoracic Surgery (Cardiothoracic Vascular Surgery)

## 2022-12-28 ENCOUNTER — Emergency Department (HOSPITAL_BASED_OUTPATIENT_CLINIC_OR_DEPARTMENT_OTHER): Payer: 59

## 2022-12-28 ENCOUNTER — Encounter (HOSPITAL_BASED_OUTPATIENT_CLINIC_OR_DEPARTMENT_OTHER): Payer: Self-pay | Admitting: Emergency Medicine

## 2022-12-28 DIAGNOSIS — Z7951 Long term (current) use of inhaled steroids: Secondary | ICD-10-CM

## 2022-12-28 DIAGNOSIS — I4892 Unspecified atrial flutter: Secondary | ICD-10-CM | POA: Diagnosis not present

## 2022-12-28 DIAGNOSIS — D751 Secondary polycythemia: Secondary | ICD-10-CM | POA: Diagnosis present

## 2022-12-28 DIAGNOSIS — E669 Obesity, unspecified: Secondary | ICD-10-CM | POA: Diagnosis not present

## 2022-12-28 DIAGNOSIS — Z79899 Other long term (current) drug therapy: Secondary | ICD-10-CM | POA: Diagnosis not present

## 2022-12-28 DIAGNOSIS — E1169 Type 2 diabetes mellitus with other specified complication: Secondary | ICD-10-CM | POA: Diagnosis not present

## 2022-12-28 DIAGNOSIS — F1410 Cocaine abuse, uncomplicated: Secondary | ICD-10-CM | POA: Diagnosis present

## 2022-12-28 DIAGNOSIS — Z8249 Family history of ischemic heart disease and other diseases of the circulatory system: Secondary | ICD-10-CM

## 2022-12-28 DIAGNOSIS — D62 Acute posthemorrhagic anemia: Secondary | ICD-10-CM | POA: Diagnosis not present

## 2022-12-28 DIAGNOSIS — B182 Chronic viral hepatitis C: Secondary | ICD-10-CM | POA: Diagnosis present

## 2022-12-28 DIAGNOSIS — Z6836 Body mass index (BMI) 36.0-36.9, adult: Secondary | ICD-10-CM

## 2022-12-28 DIAGNOSIS — I472 Ventricular tachycardia, unspecified: Secondary | ICD-10-CM | POA: Diagnosis not present

## 2022-12-28 DIAGNOSIS — I700 Atherosclerosis of aorta: Secondary | ICD-10-CM | POA: Diagnosis present

## 2022-12-28 DIAGNOSIS — I4891 Unspecified atrial fibrillation: Secondary | ICD-10-CM | POA: Diagnosis not present

## 2022-12-28 DIAGNOSIS — J9811 Atelectasis: Secondary | ICD-10-CM | POA: Diagnosis not present

## 2022-12-28 DIAGNOSIS — F17210 Nicotine dependence, cigarettes, uncomplicated: Secondary | ICD-10-CM | POA: Diagnosis not present

## 2022-12-28 DIAGNOSIS — Z7984 Long term (current) use of oral hypoglycemic drugs: Secondary | ICD-10-CM

## 2022-12-28 DIAGNOSIS — Z7982 Long term (current) use of aspirin: Secondary | ICD-10-CM

## 2022-12-28 DIAGNOSIS — I25118 Atherosclerotic heart disease of native coronary artery with other forms of angina pectoris: Secondary | ICD-10-CM | POA: Diagnosis not present

## 2022-12-28 DIAGNOSIS — Z8616 Personal history of COVID-19: Secondary | ICD-10-CM

## 2022-12-28 DIAGNOSIS — E8770 Fluid overload, unspecified: Secondary | ICD-10-CM | POA: Diagnosis not present

## 2022-12-28 DIAGNOSIS — G40909 Epilepsy, unspecified, not intractable, without status epilepticus: Secondary | ICD-10-CM

## 2022-12-28 DIAGNOSIS — I97190 Other postprocedural cardiac functional disturbances following cardiac surgery: Secondary | ICD-10-CM | POA: Diagnosis not present

## 2022-12-28 DIAGNOSIS — Z888 Allergy status to other drugs, medicaments and biological substances status: Secondary | ICD-10-CM

## 2022-12-28 DIAGNOSIS — J449 Chronic obstructive pulmonary disease, unspecified: Secondary | ICD-10-CM | POA: Diagnosis not present

## 2022-12-28 DIAGNOSIS — Z951 Presence of aortocoronary bypass graft: Secondary | ICD-10-CM

## 2022-12-28 DIAGNOSIS — Y838 Other surgical procedures as the cause of abnormal reaction of the patient, or of later complication, without mention of misadventure at the time of the procedure: Secondary | ICD-10-CM | POA: Diagnosis not present

## 2022-12-28 DIAGNOSIS — D6959 Other secondary thrombocytopenia: Secondary | ICD-10-CM | POA: Diagnosis not present

## 2022-12-28 DIAGNOSIS — E785 Hyperlipidemia, unspecified: Secondary | ICD-10-CM | POA: Diagnosis present

## 2022-12-28 DIAGNOSIS — M545 Low back pain, unspecified: Secondary | ICD-10-CM | POA: Diagnosis present

## 2022-12-28 DIAGNOSIS — G4733 Obstructive sleep apnea (adult) (pediatric): Secondary | ICD-10-CM | POA: Diagnosis present

## 2022-12-28 DIAGNOSIS — I10 Essential (primary) hypertension: Secondary | ICD-10-CM | POA: Diagnosis not present

## 2022-12-28 DIAGNOSIS — R911 Solitary pulmonary nodule: Secondary | ICD-10-CM | POA: Diagnosis present

## 2022-12-28 DIAGNOSIS — E7800 Pure hypercholesterolemia, unspecified: Secondary | ICD-10-CM | POA: Diagnosis present

## 2022-12-28 DIAGNOSIS — I214 Non-ST elevation (NSTEMI) myocardial infarction: Principal | ICD-10-CM | POA: Diagnosis present

## 2022-12-28 DIAGNOSIS — I252 Old myocardial infarction: Secondary | ICD-10-CM

## 2022-12-28 DIAGNOSIS — F419 Anxiety disorder, unspecified: Secondary | ICD-10-CM | POA: Diagnosis present

## 2022-12-28 DIAGNOSIS — Z801 Family history of malignant neoplasm of trachea, bronchus and lung: Secondary | ICD-10-CM

## 2022-12-28 DIAGNOSIS — Z885 Allergy status to narcotic agent status: Secondary | ICD-10-CM

## 2022-12-28 DIAGNOSIS — I2510 Atherosclerotic heart disease of native coronary artery without angina pectoris: Secondary | ICD-10-CM | POA: Diagnosis present

## 2022-12-28 DIAGNOSIS — F1290 Cannabis use, unspecified, uncomplicated: Secondary | ICD-10-CM | POA: Diagnosis present

## 2022-12-28 DIAGNOSIS — M79605 Pain in left leg: Secondary | ICD-10-CM | POA: Diagnosis present

## 2022-12-28 DIAGNOSIS — D72829 Elevated white blood cell count, unspecified: Secondary | ICD-10-CM | POA: Diagnosis not present

## 2022-12-28 DIAGNOSIS — K219 Gastro-esophageal reflux disease without esophagitis: Secondary | ICD-10-CM | POA: Diagnosis present

## 2022-12-28 DIAGNOSIS — Z87442 Personal history of urinary calculi: Secondary | ICD-10-CM

## 2022-12-28 DIAGNOSIS — I25110 Atherosclerotic heart disease of native coronary artery with unstable angina pectoris: Secondary | ICD-10-CM | POA: Diagnosis present

## 2022-12-28 DIAGNOSIS — E119 Type 2 diabetes mellitus without complications: Secondary | ICD-10-CM | POA: Diagnosis present

## 2022-12-28 LAB — RAPID URINE DRUG SCREEN, HOSP PERFORMED
Amphetamines: NOT DETECTED
Barbiturates: NOT DETECTED
Benzodiazepines: NOT DETECTED
Cocaine: NOT DETECTED
Opiates: POSITIVE — AB
Tetrahydrocannabinol: POSITIVE — AB

## 2022-12-28 LAB — CBC
HCT: 50.4 % (ref 39.0–52.0)
Hemoglobin: 17.7 g/dL — ABNORMAL HIGH (ref 13.0–17.0)
MCH: 31.3 pg (ref 26.0–34.0)
MCHC: 35.1 g/dL (ref 30.0–36.0)
MCV: 89.2 fL (ref 80.0–100.0)
Platelets: 142 10*3/uL — ABNORMAL LOW (ref 150–400)
RBC: 5.65 MIL/uL (ref 4.22–5.81)
RDW: 12.8 % (ref 11.5–15.5)
WBC: 9.9 10*3/uL (ref 4.0–10.5)
nRBC: 0 % (ref 0.0–0.2)

## 2022-12-28 LAB — BASIC METABOLIC PANEL
Anion gap: 10 (ref 5–15)
BUN: 15 mg/dL (ref 6–20)
CO2: 25 mmol/L (ref 22–32)
Calcium: 8.9 mg/dL (ref 8.9–10.3)
Chloride: 105 mmol/L (ref 98–111)
Creatinine, Ser: 0.77 mg/dL (ref 0.61–1.24)
GFR, Estimated: >60 mL/min (ref >60)
Glucose, Bld: 186 mg/dL — ABNORMAL HIGH (ref 70–99)
Potassium: 4.1 mmol/L (ref 3.5–5.1)
Sodium: 140 mmol/L (ref 135–145)

## 2022-12-28 LAB — TROPONIN I (HIGH SENSITIVITY)
Troponin I (High Sensitivity): 16 ng/L (ref <18)
Troponin I (High Sensitivity): 76 ng/L — ABNORMAL HIGH (ref <18)
Troponin I (High Sensitivity): 1078 ng/L (ref <18)

## 2022-12-28 LAB — HEPARIN LEVEL (UNFRACTIONATED): Heparin Unfractionated: <0.1 [IU]/mL — ABNORMAL LOW (ref 0.30–0.70)

## 2022-12-28 LAB — BRAIN NATRIURETIC PEPTIDE: B Natriuretic Peptide: 32.3 pg/mL (ref 0.0–100.0)

## 2022-12-28 MED ORDER — ACETAMINOPHEN 325 MG PO TABS
650.0000 mg | ORAL_TABLET | ORAL | Status: DC | PRN
  Administered 2023-01-01: 650 mg via ORAL
  Filled 2022-12-28: qty 2

## 2022-12-28 MED ORDER — ISOSORBIDE MONONITRATE ER 30 MG PO TB24
30.0000 mg | ORAL_TABLET | Freq: Every day | ORAL | Status: DC
  Administered 2022-12-29 – 2023-01-01 (×4): 30 mg via ORAL
  Filled 2022-12-28 (×4): qty 1

## 2022-12-28 MED ORDER — ASPIRIN 81 MG PO CHEW
243.0000 mg | CHEWABLE_TABLET | Freq: Once | ORAL | Status: AC
  Administered 2022-12-28: 243 mg via ORAL
  Filled 2022-12-28: qty 3

## 2022-12-28 MED ORDER — ONDANSETRON HCL 4 MG/2ML IJ SOLN: 4.0000 mg | Freq: Four times a day (QID) | INTRAMUSCULAR | Status: DC | PRN

## 2022-12-28 MED ORDER — HEPARIN BOLUS VIA INFUSION
4000.0000 [IU] | Freq: Once | INTRAVENOUS | Status: AC
  Administered 2022-12-28: 4000 [IU] via INTRAVENOUS

## 2022-12-28 MED ORDER — HEPARIN BOLUS VIA INFUSION
2000.0000 [IU] | Freq: Once | INTRAVENOUS | Status: AC
  Administered 2022-12-28: 2000 [IU] via INTRAVENOUS
  Filled 2022-12-28: qty 2000

## 2022-12-28 MED ORDER — NITROGLYCERIN 0.4 MG SL SUBL
SUBLINGUAL_TABLET | SUBLINGUAL | Status: AC
  Administered 2022-12-28: 0.4 mg
  Filled 2022-12-28: qty 1

## 2022-12-28 MED ORDER — HEPARIN (PORCINE) 25000 UT/250ML-% IV SOLN
2100.0000 [IU]/h | INTRAVENOUS | Status: DC
  Administered 2022-12-28: 1300 [IU]/h via INTRAVENOUS
  Administered 2022-12-29: 1800 [IU]/h via INTRAVENOUS
  Administered 2022-12-29: 1600 [IU]/h via INTRAVENOUS
  Administered 2022-12-30 (×2): 2100 [IU]/h via INTRAVENOUS
  Filled 2022-12-28 (×5): qty 250

## 2022-12-28 MED ORDER — ASPIRIN 81 MG PO TBEC
81.0000 mg | DELAYED_RELEASE_TABLET | Freq: Every day | ORAL | Status: DC
  Administered 2022-12-29 – 2023-01-01 (×3): 81 mg via ORAL
  Filled 2022-12-28 (×3): qty 1

## 2022-12-28 MED ORDER — ATORVASTATIN CALCIUM 80 MG PO TABS
80.0000 mg | ORAL_TABLET | Freq: Every day | ORAL | Status: DC
  Administered 2022-12-29 – 2023-01-01 (×4): 80 mg via ORAL
  Filled 2022-12-28 (×4): qty 1

## 2022-12-28 MED ORDER — MOMETASONE FURO-FORMOTEROL FUM 200-5 MCG/ACT IN AERO
2.0000 | INHALATION_SPRAY | Freq: Two times a day (BID) | RESPIRATORY_TRACT | Status: DC
  Administered 2023-01-04 – 2023-01-08 (×10): 2 via RESPIRATORY_TRACT
  Filled 2022-12-28 (×2): qty 8.8

## 2022-12-28 MED ORDER — NITROGLYCERIN 0.4 MG SL SUBL
0.4000 mg | SUBLINGUAL_TABLET | SUBLINGUAL | Status: DC | PRN
  Administered 2022-12-28: 0.4 mg via SUBLINGUAL
  Filled 2022-12-28 (×2): qty 1

## 2022-12-28 MED ORDER — MORPHINE SULFATE (PF) 4 MG/ML IV SOLN
4.0000 mg | Freq: Once | INTRAVENOUS | Status: AC
  Administered 2022-12-28: 4 mg via INTRAVENOUS
  Filled 2022-12-28: qty 1

## 2022-12-28 MED ORDER — METOPROLOL TARTRATE 25 MG PO TABS
25.0000 mg | ORAL_TABLET | Freq: Every day | ORAL | Status: DC
  Administered 2022-12-29 – 2022-12-31 (×3): 25 mg via ORAL
  Filled 2022-12-28 (×3): qty 1

## 2022-12-28 MED ORDER — NICOTINE 21 MG/24HR TD PT24
21.0000 mg | MEDICATED_PATCH | Freq: Every day | TRANSDERMAL | Status: DC
  Administered 2022-12-28 – 2023-01-09 (×12): 21 mg via TRANSDERMAL
  Filled 2022-12-28 (×12): qty 1

## 2022-12-28 NOTE — ED Notes (Signed)
3rd nitroglycerin given per MD verbal order. Pt's "sensation" remains unchanged.

## 2022-12-28 NOTE — ED Notes (Signed)
Patient offered and refused nitroglycerin SL for 3/10 chest discomfort at this time.

## 2022-12-28 NOTE — ED Notes (Signed)
2nd Sl nitroglycerin given

## 2022-12-28 NOTE — ED Notes (Signed)
Pain remains unchanged per patient.

## 2022-12-28 NOTE — Consult Note (Signed)
ELECTROPHYSIOLOGY CONSULT NOTE  Patient ID: [name], MRN: [id number], DOB/AGE: 06-24-1960 62 y.o. Admit date: 12/28/2022 Date of Consult: 12/28/2022  Primary Physician: Tresa Garter, MD Primary Cardiologist: BMu     [name] is a 62 y.o. male who is being seen today for the evaluation of NSTEMI at the request of ER.    HPI [name] is a 62 y.o. male  presented diaphoresis chest pain radiating to his left arm and shortness of breath that awoke him from sleep this morning  Trop 16>>76>>1078 associated with dynamic T wave inversions in the anterolateral leads Started on heparin; patient declined nitroglycerin; now pain-free   Prev history of nonobstructive coronary disease in 2 ablation performed and underwent CTA he will 01/01/2019 that was negative.  Smokes, strong family history LDL 3/24 was surprisingly only 91     DATE TEST EF   6/15 Myoview   58 % Low risk   7/15 LHC  Non obstructive CAD  10/22 CTA   All FFR > 0.91        Date Cr K Hgb  8/24 0.77 4.1 17.7            Past Medical History:  Diagnosis Date   Anxiety    CAD (coronary artery disease)    Cocaine abuse (HCC)    none now   Diabetes mellitus     II, borderline, diet controlled   GERD (gastroesophageal reflux disease)    Hyperlipidemia    Myocardial infarction (HCC) "distant past"   related to cocaine abuse(over 10 years ago per pt)   Obesity, unspecified    OSA (obstructive sleep apnea)    uses BIPAP   Pain, low back    Seizures (HCC)    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 leg   FRACTURE SURGERY     L. arm   LEFT HEART CATHETERIZATION WITH CORONARY ANGIOGRAM N/A 11/22/2013   Procedure: LEFT HEART CATHETERIZATION WITH CORONARY ANGIOGRAM;  Surgeon:  Laurey Morale, MD;  Location: [facility] CATH LAB;  Service: Cardiovascular;  Laterality: N/A;     Home Meds: No outpatient medications have been marked as taking for the 12/28/22 encounter [facility] Encounter).    Allergies:  Allergies  Allergen Reactions   Ace Inhibitors    Lisinopril     cough   Paxlovid [Nirmatrelvir-Ritonavir]     ?rash   Tramadol     Pt had seizures while on it    Social History   Socioeconomic History   Marital status: Married    Spouse name: Not on file   Number of children: 2   Years of education: Not on file   Highest education level: Not on file  Occupational History   Occupation: car sales  Tobacco Use   Smoking status: Every Day    Current packs/day: 2.00    Average packs/day: 2.0 packs/day for 25.0 years (50.0 ttl pk-yrs)    Types: Cigarettes   Smokeless tobacco: Never  Vaping Use   Vaping status: Never Used  Substance and Sexual Activity   Alcohol use: Yes    Alcohol/week: 12.0 standard drinks of alcohol    Types: 12 Cans of  beer per week   Drug use: Yes    Frequency: 7.0 times per week    Types: Marijuana    Comment: cocaine abuse yrs ago. none now.   Sexual activity: Yes  Other Topics Concern   Not on file  Social History Narrative   Not on file   Social Determinants of Health   Financial Resource Strain: Not on file  Food Insecurity: Not on file  Transportation Needs: Not on file  Physical Activity: Not on file  Stress: Not on file  Social Connections: Not on file  Intimate Partner Violence: Not on file     Family History  Problem Relation Age of Onset   Heart attack Father 72       grandfather died MI 32   Heart disease Father        cad   Cancer Mother        lung ca   Heart attack Other    Atrial fibrillation Other    Colon cancer Neg Hx    Colon polyps Neg Hx    Esophageal cancer Neg Hx    Rectal cancer Neg Hx    Stomach cancer Neg Hx      ROS:  Please see the history of present illness.     All other  systems reviewed and negative.    Physical Exam: Blood pressure (!) 153/82, pulse 86, temperature 97.9 F (36.6 C), temperature source Oral, resp. rate 20, height 6' (1.829 m), weight 123.2 kg, SpO2 96%. General: Well developed, Morbidly obese  male in no acute distress. Head: Normocephalic, atraumatic, sclera non-icteric, no xanthomas, nares are without discharge. EENT: normal  Lymph Nodes:  none Neck: Negative for carotid bruits. JVD not elevated. Back:without scoliosis kyphosis Lungs: Clear bilaterally to auscultation without wheezes, rales, or rhonchi. Breathing is unlabored. Heart: RRR with S1 S2. No murmur . No rubs, or gallops appreciated. Abdomen: Soft, non-tender, non-distended with normoactive bowel sounds. No hepatomegaly. No rebound/guarding. No obvious abdominal masses. Msk:  Strength and tone appear normal for age. Extremities: No clubbing or cyanosis. No  edema.  Distal pedal pulses are 2+ and equal bilaterally. Skin: Warm and Dry Neuro: Alert and oriented X 3. CN III-XII intact Grossly normal sensory and motor function . Psych:  Responds to questions appropriately with a normal affect.        EKG: 1535  h sinus at 79 Intervals 15/10/38 T wave inversions with ST segment depression 1, L, V3-V6  10:24 h sinus at 67 Interval 17/10/39 T wave inversions 1, L, V3-V6  0 631 h ST-T wave changes subsequently evident are normal Assessment and Plan:  Non-STEMI  Cigarette abuse  Polycythemia   Morbid obesity  Patient with a non-STEMI with resolution of pain with heparin.  Will continue heparin.  Aspirin.  Begin statins.  Catheterization on Monday.  Risks reviewed.  Denies any history of hypertension despite being in the medical record  Polycythemia.  Likely related to hypoxia at night.  Will check erythropoietin      [name]

## 2022-12-28 NOTE — ED Provider Notes (Signed)
[place] [facility] AT [facility] Provider Note   CSN: [id number] Arrival date & time: 12/28/22  0556     History  Chief Complaint  Patient presents with   Chest Pain    Ricard Pendergraph Meersman is a 62 y.o. male.  Patient is a 62 year old male with past medical history of diabetes, obesity, obstructive sleep apnea, and coronary artery disease.  Patient presenting today with complaints of chest pain.  This started approximately 5 AM and woke him from sleep.  He was wearing his CPAP mask when he woke up with this discomfort.  He describes heaviness in his chest radiating down both arms.  This is associated with shortness of breath and diaphoresis.  No fevers or chills.  No aggravating or alleviating factors.  Patient does have prior cardiac history with nonocclusive stenoses noted in several of his coronary arteries on heart cath in 2015 and 2017.  He most recently had a coronary CT performed in 2022 showing low risk for occlusive coronary artery disease.  The history is provided by the patient.       Home Medications Prior to Admission medications   Medication Sig Start Date End Date Taking? Authorizing Provider  ALPRAZolam (XANAX) 1 MG tablet TAKE 1 TABLET BY MOUTH 2 TIMES DAILY AS NEEDED. 07/31/22   Plotnikov, Georgina Quint, MD  aspirin EC 81 MG tablet Take 81 mg by mouth daily. Swallow whole.    [provider]  Blood Glucose Monitoring Suppl (ONE TOUCH ULTRA 2) w/Device KIT Use to check blood sugars twice a day 08/13/22   Plotnikov, Georgina Quint, MD  budesonide-formoterol (SYMBICORT) 160-4.5 MCG/ACT inhaler INHALE 2 PUFS INTO THE LUNGS TWICE A DAY 10/29/21   Plotnikov, Georgina Quint, MD  diclofenac (VOLTAREN) 75 MG EC tablet Take 1 tablet (75 mg total) by mouth 2 (two) times daily as needed. Take 75 mg by mouth 2 (two) times daily as needed. 05/01/22   Plotnikov, Georgina Quint, MD  glucose blood (ONETOUCH ULTRA) test strip Use to check blood sugars twice a day  08/13/22   Plotnikov, Georgina Quint, MD  isosorbide mononitrate (IMDUR) 30 MG 24 hr tablet Take 1 tablet (30 mg total) by mouth daily. 10/31/22   Plotnikov, Georgina Quint, MD  metFORMIN (GLUCOPHAGE) 500 MG tablet Take 1 tablet (500 mg total) by mouth daily with breakfast. 05/01/22   Plotnikov, Georgina Quint, MD  methylPREDNISolone (MEDROL DOSEPAK) 4 MG TBPK tablet As directed 05/01/22   Plotnikov, Georgina Quint, MD  metoprolol tartrate (LOPRESSOR) 25 MG tablet Take 1 tablet (25 mg total) by mouth daily. Take 25 mg by mouth daily. 10/31/22   Plotnikov, Georgina Quint, MD  nitroGLYCERIN (NITROSTAT) 0.4 MG SL tablet Place 1 tablet (0.4 mg total) under the tongue every 5 (five) minutes as needed for chest pain. 10/29/21   Plotnikov, Georgina Quint, MD  OneTouch UltraSoft 2 Lancets MISC 1 Lancet by Does not apply route 2 (two) times daily. 08/13/22   Plotnikov, Georgina Quint, MD      Allergies    Ace inhibitors, Lisinopril, Paxlovid [nirmatrelvir-ritonavir], and Tramadol    Review of Systems   Review of Systems  All other systems reviewed and are negative.   Physical Exam Updated Vital Signs Ht 6' (1.829 m)   Wt 127 kg   BMI 37.97 kg/m  Physical Exam Vitals and nursing note reviewed.  Constitutional:      General: He is not in acute distress.    Appearance: He is well-developed. He is not  diaphoretic.  HENT:     Head: Normocephalic and atraumatic.  Cardiovascular:     Rate and Rhythm: Normal rate and regular rhythm.     Heart sounds: No murmur heard.    No friction rub.  Pulmonary:     Effort: Pulmonary effort is normal. No respiratory distress.     Breath sounds: Normal breath sounds. No wheezing or rales.  Abdominal:     General: Bowel sounds are normal. There is no distension.     Palpations: Abdomen is soft.     Tenderness: There is abdominal tenderness. There is no guarding or rebound.     Comments: There is tenderness to palpation across the lower abdomen.  Musculoskeletal:        General: Normal range of  motion.     Cervical back: Normal range of motion and neck supple.     Right lower leg: No edema.     Left lower leg: No edema.  Skin:    General: Skin is warm and dry.  Neurological:     Mental Status: He is alert and oriented to person, place, and time.     Coordination: Coordination normal.     ED Results / Procedures / Treatments   Labs (all labs ordered are listed, but only abnormal results are displayed) Labs Reviewed  BASIC METABOLIC PANEL  CBC  TROPONIN I (HIGH SENSITIVITY)    EKG None  Radiology No results found.  Procedures Procedures  {Document cardiac monitor, telemetry assessment procedure when appropriate:1}  Medications Ordered in ED Medications - No data to display  ED Course/ Medical Decision Making/ A&P   {   Click here for ABCD2, HEART and other calculatorsREFRESH Note before signing :1}                              Medical Decision Making Amount and/or Complexity of Data Reviewed Labs: ordered. Radiology: ordered.   ***  {Document critical care time when appropriate:1} {Document review of labs and clinical decision tools ie heart score, Chads2Vasc2 etc:1}  {Document your independent review of radiology images, and any outside records:1} {Document your discussion with family members, caretakers, and with consultants:1} {Document social determinants of health affecting pt's care:1} {Document your decision making why or why not admission, treatments were needed:1} Final Clinical Impression(s) / ED Diagnoses Final diagnoses:  None    Rx / DC Orders ED Discharge Orders     None
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
## 2022-12-28 NOTE — Progress Notes (Signed)
ANTICOAGULATION CONSULT NOTE - Initial Consult  Pharmacy Consult for Heparin Indication: chest pain/ACS  Allergies  Allergen Reactions   Ace Inhibitors    Lisinopril     cough   Paxlovid [Nirmatrelvir-Ritonavir]     ?rash   Tramadol     Pt had seizures while on it    Patient Measurements: Height: 6' (182.9 cm) Weight: 127 kg (280 lb) IBW/kg (Calculated) : 77.6 Heparin Dosing Weight: 106 kg  Vital Signs: Temp: 97.6 F (36.4 C) (08/17 0835) Temp Source: Oral (08/17 0835) BP: 130/81 (08/17 0645) Pulse Rate: 65 (08/17 0835)  Labs: Recent Labs    12/28/22 0612 12/28/22 0747  HGB 17.7*  --   HCT 50.4  --   PLT 142*  --   CREATININE 0.77  --   TROPONINIHS 16 76*    Estimated Creatinine Clearance: 135.3 mL/min (by C-G formula based on SCr of 0.77 mg/dL).   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 Patient is presenting with chest pain. Heparin per pharmacy consult placed for chest pain/ACS.  Patient is not on anticoagulation prior to arrival.  Hgb 17.7; plt 142  Goal of Therapy:  Heparin level 0.3-0.7 units/ml Monitor platelets by anticoagulation protocol: Yes   Plan:  Give IV heparin 4000 units bolus x 1 Start heparin infusion at 1300 units/hr Check anti-Xa level in 6  hours and daily while on heparin Continue to monitor H&H and platelets  Delmar Landau, PharmD, BCPS 12/28/2022 10:26 AM ED Clinical Pharmacist -  [phone]

## 2022-12-28 NOTE — ED Triage Notes (Signed)
Pt states has had shoulder pain for two days, but woke up about with chest pain that is radiating down both arms.
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
## 2022-12-28 NOTE — ED Notes (Signed)
1 tab SL nitroglycerin given at this time per verbal order for MD, Delo

## 2022-12-28 NOTE — ED Notes (Signed)
Troponin result called to nurse by lab tech. Level noted to be 76.

## 2022-12-28 NOTE — Plan of Care (Addendum)
Transfer from [facility] [name] is a 62 year old male with pmh hyperlipidemia, nonobstructive CAD, DM type II, GERD, anxiety, substance abuse (cocaine, tobacco), obesity, and OSA significant for presented with complaints of chest pain.  EKG noted to have nonspecific ST-T wave changes in the inferior leads.  High-sensitivity troponin 16 with repeat check 76.  Chest x-ray noted diffuse interstitial opacity likely chronic although component of interstitial edema not excluded.  He was given full dose aspirin, morphine, nitroglycerin, and started on a heparin drip.  Case had been discussed with Dr. Graciela Husbands of cardiology and recommended to admit to the hospitalist.  Orders placed for UDS, BNP, and observation status to a telemetry bed.  Notified cardiology upon patient arrival. ED provider updated me that repeat EKG noted new T wave inversions in the anterolateral leads.

## 2022-12-29 ENCOUNTER — Observation Stay (HOSPITAL_COMMUNITY): Payer: 59

## 2022-12-29 DIAGNOSIS — D62 Acute posthemorrhagic anemia: Secondary | ICD-10-CM | POA: Diagnosis not present

## 2022-12-29 DIAGNOSIS — I214 Non-ST elevation (NSTEMI) myocardial infarction: Secondary | ICD-10-CM

## 2022-12-29 DIAGNOSIS — D751 Secondary polycythemia: Secondary | ICD-10-CM | POA: Diagnosis present

## 2022-12-29 DIAGNOSIS — I4891 Unspecified atrial fibrillation: Secondary | ICD-10-CM | POA: Diagnosis present

## 2022-12-29 DIAGNOSIS — G4733 Obstructive sleep apnea (adult) (pediatric): Secondary | ICD-10-CM | POA: Diagnosis not present

## 2022-12-29 DIAGNOSIS — E669 Obesity, unspecified: Secondary | ICD-10-CM | POA: Diagnosis present

## 2022-12-29 DIAGNOSIS — F17210 Nicotine dependence, cigarettes, uncomplicated: Secondary | ICD-10-CM | POA: Diagnosis not present

## 2022-12-29 DIAGNOSIS — D72829 Elevated white blood cell count, unspecified: Secondary | ICD-10-CM | POA: Diagnosis not present

## 2022-12-29 DIAGNOSIS — Z720 Tobacco use: Secondary | ICD-10-CM | POA: Diagnosis not present

## 2022-12-29 DIAGNOSIS — B182 Chronic viral hepatitis C: Secondary | ICD-10-CM | POA: Diagnosis present

## 2022-12-29 DIAGNOSIS — I499 Cardiac arrhythmia, unspecified: Secondary | ICD-10-CM | POA: Diagnosis not present

## 2022-12-29 DIAGNOSIS — G40909 Epilepsy, unspecified, not intractable, without status epilepticus: Secondary | ICD-10-CM | POA: Diagnosis present

## 2022-12-29 DIAGNOSIS — Y838 Other surgical procedures as the cause of abnormal reaction of the patient, or of later complication, without mention of misadventure at the time of the procedure: Secondary | ICD-10-CM | POA: Diagnosis not present

## 2022-12-29 DIAGNOSIS — I4819 Other persistent atrial fibrillation: Secondary | ICD-10-CM | POA: Diagnosis not present

## 2022-12-29 DIAGNOSIS — E7800 Pure hypercholesterolemia, unspecified: Secondary | ICD-10-CM | POA: Diagnosis present

## 2022-12-29 DIAGNOSIS — F419 Anxiety disorder, unspecified: Secondary | ICD-10-CM | POA: Diagnosis present

## 2022-12-29 DIAGNOSIS — I10 Essential (primary) hypertension: Secondary | ICD-10-CM | POA: Diagnosis not present

## 2022-12-29 DIAGNOSIS — F1410 Cocaine abuse, uncomplicated: Secondary | ICD-10-CM | POA: Diagnosis present

## 2022-12-29 DIAGNOSIS — E1169 Type 2 diabetes mellitus with other specified complication: Secondary | ICD-10-CM | POA: Diagnosis present

## 2022-12-29 DIAGNOSIS — Z01810 Encounter for preprocedural cardiovascular examination: Secondary | ICD-10-CM | POA: Diagnosis not present

## 2022-12-29 DIAGNOSIS — Z79899 Other long term (current) drug therapy: Secondary | ICD-10-CM | POA: Diagnosis not present

## 2022-12-29 DIAGNOSIS — Z8616 Personal history of COVID-19: Secondary | ICD-10-CM | POA: Diagnosis not present

## 2022-12-29 DIAGNOSIS — J449 Chronic obstructive pulmonary disease, unspecified: Secondary | ICD-10-CM | POA: Diagnosis not present

## 2022-12-29 DIAGNOSIS — E8770 Fluid overload, unspecified: Secondary | ICD-10-CM | POA: Diagnosis not present

## 2022-12-29 DIAGNOSIS — I2510 Atherosclerotic heart disease of native coronary artery without angina pectoris: Secondary | ICD-10-CM | POA: Diagnosis not present

## 2022-12-29 DIAGNOSIS — I4892 Unspecified atrial flutter: Secondary | ICD-10-CM | POA: Diagnosis present

## 2022-12-29 DIAGNOSIS — E785 Hyperlipidemia, unspecified: Secondary | ICD-10-CM | POA: Diagnosis not present

## 2022-12-29 DIAGNOSIS — I97190 Other postprocedural cardiac functional disturbances following cardiac surgery: Secondary | ICD-10-CM | POA: Diagnosis not present

## 2022-12-29 DIAGNOSIS — I472 Ventricular tachycardia, unspecified: Secondary | ICD-10-CM | POA: Diagnosis not present

## 2022-12-29 DIAGNOSIS — R9431 Abnormal electrocardiogram [ECG] [EKG]: Secondary | ICD-10-CM | POA: Diagnosis not present

## 2022-12-29 DIAGNOSIS — D6959 Other secondary thrombocytopenia: Secondary | ICD-10-CM | POA: Diagnosis not present

## 2022-12-29 DIAGNOSIS — I25110 Atherosclerotic heart disease of native coronary artery with unstable angina pectoris: Secondary | ICD-10-CM | POA: Diagnosis not present

## 2022-12-29 DIAGNOSIS — J9811 Atelectasis: Secondary | ICD-10-CM | POA: Diagnosis not present

## 2022-12-29 DIAGNOSIS — Z951 Presence of aortocoronary bypass graft: Secondary | ICD-10-CM | POA: Diagnosis not present

## 2022-12-29 DIAGNOSIS — I700 Atherosclerosis of aorta: Secondary | ICD-10-CM | POA: Diagnosis present

## 2022-12-29 LAB — LIPID PANEL
Cholesterol: 153 mg/dL (ref 0–200)
HDL: 33 mg/dL — ABNORMAL LOW (ref >40)
LDL Cholesterol: 84 mg/dL (ref 0–99)
Total CHOL/HDL Ratio: 4.6 ratio
Triglycerides: 180 mg/dL — ABNORMAL HIGH (ref <150)
VLDL: 36 mg/dL (ref 0–40)

## 2022-12-29 LAB — CBC
HCT: 47.7 % (ref 39.0–52.0)
Hemoglobin: 16.2 g/dL (ref 13.0–17.0)
MCH: 31.3 pg (ref 26.0–34.0)
MCHC: 34 g/dL (ref 30.0–36.0)
MCV: 92.1 fL (ref 80.0–100.0)
Platelets: 135 10*3/uL — ABNORMAL LOW (ref 150–400)
RBC: 5.18 MIL/uL (ref 4.22–5.81)
RDW: 12.9 % (ref 11.5–15.5)
WBC: 6.3 10*3/uL (ref 4.0–10.5)
nRBC: 0 % (ref 0.0–0.2)

## 2022-12-29 LAB — BASIC METABOLIC PANEL
Anion gap: 12 (ref 5–15)
BUN: 15 mg/dL (ref 6–20)
CO2: 24 mmol/L (ref 22–32)
Calcium: 8.8 mg/dL — ABNORMAL LOW (ref 8.9–10.3)
Chloride: 104 mmol/L (ref 98–111)
Creatinine, Ser: 0.79 mg/dL (ref 0.61–1.24)
GFR, Estimated: >60 mL/min (ref >60)
Glucose, Bld: 159 mg/dL — ABNORMAL HIGH (ref 70–99)
Potassium: 3.9 mmol/L (ref 3.5–5.1)
Sodium: 140 mmol/L (ref 135–145)

## 2022-12-29 LAB — ECHOCARDIOGRAM COMPLETE
Area-P 1/2: 2.43 cm2
Height: 72 in
S' Lateral: 5 cm
Weight: 4345.71 [oz_av]

## 2022-12-29 LAB — HEMOGLOBIN A1C
Hgb A1c MFr Bld: 6.6 % — ABNORMAL HIGH (ref 4.8–5.6)
Mean Plasma Glucose: 142.72 mg/dL

## 2022-12-29 LAB — HEPARIN LEVEL (UNFRACTIONATED)
Heparin Unfractionated: 0.15 [IU]/mL — ABNORMAL LOW (ref 0.30–0.70)
Heparin Unfractionated: 0.16 [IU]/mL — ABNORMAL LOW (ref 0.30–0.70)
Heparin Unfractionated: 0.43 [IU]/mL (ref 0.30–0.70)

## 2022-12-29 LAB — HIV ANTIBODY (ROUTINE TESTING W REFLEX): HIV Screen 4th Generation wRfx: NONREACTIVE

## 2022-12-29 MED ORDER — HEPARIN BOLUS VIA INFUSION
2500.0000 [IU] | Freq: Once | INTRAVENOUS | Status: AC
  Administered 2022-12-29: 2500 [IU] via INTRAVENOUS
  Filled 2022-12-29: qty 2500

## 2022-12-29 MED ORDER — HEPARIN BOLUS VIA INFUSION
2000.0000 [IU] | Freq: Once | INTRAVENOUS | Status: AC
  Administered 2022-12-29: 2000 [IU] via INTRAVENOUS
  Filled 2022-12-29: qty 2000

## 2022-12-29 MED ORDER — ASPIRIN 81 MG PO CHEW
81.0000 mg | CHEWABLE_TABLET | ORAL | Status: AC
  Administered 2022-12-30: 81 mg via ORAL
  Filled 2022-12-29: qty 1

## 2022-12-29 MED ORDER — SODIUM CHLORIDE 0.9 % WEIGHT BASED INFUSION
1.0000 mL/kg/h | INTRAVENOUS | Status: DC
  Administered 2022-12-30 (×2): 1 mL/kg/h via INTRAVENOUS

## 2022-12-29 MED ORDER — POLYETHYLENE GLYCOL 3350 17 G PO PACK
17.0000 g | PACK | Freq: Two times a day (BID) | ORAL | Status: DC
  Administered 2022-12-29 – 2023-01-04 (×6): 17 g via ORAL
  Filled 2022-12-29 (×10): qty 1

## 2022-12-29 MED ORDER — PERFLUTREN LIPID MICROSPHERE
1.0000 mL | INTRAVENOUS | Status: AC | PRN
  Administered 2022-12-29: 4 mL via INTRAVENOUS

## 2022-12-29 MED ORDER — NAPHAZOLINE-PHENIRAMINE 0.025-0.3 % OP SOLN
1.0000 [drp] | Freq: Four times a day (QID) | OPHTHALMIC | Status: DC | PRN
  Filled 2022-12-29: qty 15

## 2022-12-29 MED ORDER — SODIUM CHLORIDE 0.9 % WEIGHT BASED INFUSION
3.0000 mL/kg/h | INTRAVENOUS | Status: DC
  Administered 2022-12-30: 3 mL/kg/h via INTRAVENOUS

## 2022-12-29 NOTE — Progress Notes (Signed)
Cardiology Progress Note  Patient ID: [name] MRN: [id number] DOB: 08/26/1960 Date of Encounter: 12/29/2022  Primary Cardiologist: None  Subjective   Chief Complaint: None.   HPI: No CP overnight.   ROS:  All other ROS reviewed and negative. Pertinent positives noted in the HPI.     Inpatient Medications  Scheduled Meds:  aspirin EC  81 mg Oral Daily   atorvastatin  80 mg Oral Daily   isosorbide mononitrate  30 mg Oral Daily   metoprolol tartrate  25 mg Oral Daily   mometasone-formoterol  2 puff Inhalation BID   nicotine  21 mg Transdermal Daily   Continuous Infusions:  heparin 1,800 Units/hr (12/29/22 0524)   PRN Meds: acetaminophen, nitroGLYCERIN, ondansetron (ZOFRAN) IV   Vital Signs   Vitals:   12/28/22 2237 12/28/22 2343 12/29/22 0254 12/29/22 0736  BP:  133/81 130/67 (!) 149/83  Pulse: 81 71 74 91  Resp: 16 17  17   Temp:  97.7 F (36.5 C) 98 F (36.7 C) 97.7 F (36.5 C)  TempSrc:  Axillary Axillary Oral  SpO2: 93% 92% 91% 92%  Weight:      Height:        Intake/Output Summary (Last 24 hours) at 12/29/2022 0837 Last data filed at 12/29/2022 0738 Gross per 24 hour  Intake 946.81 ml  Output 200 ml  Net 746.81 ml      12/28/2022    4:00 PM 12/28/2022    6:02 AM 10/31/2022    8:01 AM  Last 3 Weights  Weight (lbs) 271 lb 9.7 oz 280 lb 283 lb  Weight (kg) 123.2 kg 127.007 kg 128.368 kg      Telemetry  Overnight telemetry shows SR 100s, which I personally reviewed.   ECG  The most recent ECG shows sinus rhythm heart rate 72, anterior T wave inversions, which I personally reviewed.   Physical Exam   Vitals:   12/28/22 2237 12/28/22 2343 12/29/22 0254 12/29/22 0736  BP:  133/81 130/67 (!) 149/83  Pulse: 81 71 74 91  Resp: 16 17  17   Temp:  97.7 F (36.5 C) 98 F (36.7 C) 97.7 F (36.5 C)  TempSrc:  Axillary Axillary Oral  SpO2: 93% 92% 91% 92%  Weight:      Height:        Intake/Output Summary (Last 24 hours) at  12/29/2022 0837 Last data filed at 12/29/2022 0738 Gross per 24 hour  Intake 946.81 ml  Output 200 ml  Net 746.81 ml       12/28/2022    4:00 PM 12/28/2022    6:02 AM 10/31/2022    8:01 AM  Last 3 Weights  Weight (lbs) 271 lb 9.7 oz 280 lb 283 lb  Weight (kg) 123.2 kg 127.007 kg 128.368 kg    Body mass index is 36.84 kg/m.  General: Well nourished, well developed, in no acute distress Head: Atraumatic, normal size  Eyes: PEERLA, EOMI  Neck: Supple, no JVD Endocrine: No thryomegaly Cardiac: Normal S1, S2; RRR; no murmurs, rubs, or gallops Lungs: Clear to auscultation bilaterally, no wheezing, rhonchi or rales  Abd: Soft, nontender, no hepatomegaly  Ext: No edema, pulses 2+ Musculoskeletal: No deformities, BUE and BLE strength normal and equal Skin: Warm and dry, no rashes   Neuro: Alert and oriented to person, place, time, and situation, CNII-XII grossly intact, no focal deficits  Psych: Normal mood and affect   Labs  High Sensitivity Troponin:   Recent Labs  Lab 12/28/22 0612  12/28/22 0747 12/28/22 1417  TROPONINIHS 16 76* 1,078*     Cardiac EnzymesNo results for input(s): "TROPONINI" in the last 168 hours. No results for input(s): "TROPIPOC" in the last 168 hours.  Chemistry Recent Labs  Lab 12/28/22 0612 12/29/22 0336  NA 140 140  K 4.1 3.9  CL 105 104  CO2 25 24  GLUCOSE 186* 159*  BUN 15 15  CREATININE 0.77 0.79  CALCIUM 8.9 8.8*  GFRNONAA >60 >60  ANIONGAP 10 12    Hematology Recent Labs  Lab 12/28/22 0612 12/29/22 0336  WBC 9.9 6.3  RBC 5.65 5.18  HGB 17.7* 16.2  HCT 50.4 47.7  MCV 89.2 92.1  MCH 31.3 31.3  MCHC 35.1 34.0  RDW 12.8 12.9  PLT 142* 135*   BNP Recent Labs  Lab 12/28/22 0612  BNP 32.3    DDimer No results for input(s): "DDIMER" in the last 168 hours.   Radiology  DG Chest Portable 1 View  Result Date: 12/28/2022 CLINICAL DATA:  Chest pain EXAM: PORTABLE CHEST 1 VIEW COMPARISON:  02/21/2017 FINDINGS: Cardiopericardial  silhouette is at upper limits of normal for size. Diffuse interstitial opacity evident, likely chronic although component of interstitial edema not excluded. No focal airspace consolidation, pneumothorax, or substantial pleural effusion. Telemetry leads overlie the chest. IMPRESSION: Diffuse interstitial opacity, likely chronic although component of interstitial edema not excluded. Electronically Signed   By: Kennith Center M.D.   On: 12/28/2022 07:03    Cardiac Studies  CCTA 02/2021 IMPRESSION: 1. Coronary calcium score of 315. This was 88th percentile for age-, sex, and race-matched controls.   2. Normal coronary origin with right dominance.   3. Mild mixed density plaque (25-49%) in the mid LAD.   4. Mild non-calcified plaque (25-49%) in the distal LCX.   5. Mild mixed density plaque (25-49%) in the mid RCA.   RECOMMENDATIONS: 1. Diffuse, mild non-obstructive CAD (25-49%). Consider preventive therapy and risk factor modification. Due to diffuse CAD, additional analysis with CT FFR will be submitted.  Patient Profile  [name] is a 62 y.o. male with hypertension, hyperlipidemia, obesity, GERD, diabetes, COPD, CAD admitted on 12/28/2022 for non-STEMI.  Assessment & Plan   # Non-STEMI -Known history of nonobstructive CAD in 2022.  Now here with chest pain and elevated troponin.  Findings are consistent with non-STEMI.   -Continue aspirin statin and heparin drip.  He is on metoprolol.  Blood pressure stable.  EKG does show anterior T wave inversions.  Suspect LAD or RCA territory.  Echo pending.  LDL 84.  Not at goal.  Continue high intensity statin therapy.  May need further intensification as an outpatient. -A1c 6.6.  Per hospital medicine. -No further chest pain.  N.p.o. at midnight for left heart catheterization. Risk and benefits of cardiac cath explained.  He is willing to proceed tomorrow.  Informed Consent   Shared Decision Making/Informed Consent The risks  [stroke (1 in 1000), death (1 in 1000), kidney failure [usually temporary] (1 in 500), bleeding (1 in 200), allergic reaction [possibly serious] (1 in 200)], benefits (diagnostic support and management of coronary artery disease) and alternatives of a cardiac catheterization were discussed in detail with [name] and he is willing to proceed.          For questions or updates, please contact [facility] Please consult [email or web address] for contact info under        Signed, Gerri Spore T. Flora Lipps, MD, [facility] [place]  [facility]  12/29/2022  8:37 AM

## 2022-12-29 NOTE — Progress Notes (Signed)
PROGRESS NOTE  [name] OZH:[id number] DOB: 1960-09-07 DOA: 12/28/2022 PCP: Tresa Garter, MD   LOS: 0 days   Brief Narrative / Interim history: 62 y.o. male with medical history significant of hypertension, hyperlipidemia, GERD, diabetes, COPD, childhood epilepsy, chronic hepatitis C, obesity, OSA, CAD status post MI, cocaine use, anxiety, low back pain presenting with chest pain.  Was found to have an NSTEMI, cardiology consulted  Subjective / 24h Interval events: No chest pain, feels well otherwise  Assesement and Plan: Principal Problem:   NSTEMI (non-ST elevated myocardial infarction) (HCC) Active Problems:   Morbid obesity (HCC)   Anxiety disorder   Epilepsy (HCC)   HTN (hypertension)   MYOCARDIAL INFARCTION, HX OF   GERD   Low back pain radiating to left lower extremity   COPD mixed type (HCC)   OSA (obstructive sleep apnea)   CAD (coronary artery disease)   Cocaine abuse (HCC)   Pure hypercholesterolemia   Principal problem Non-STEMI, underlying CAD -with prior MI.  Continue heparin, scheduled for cardiac cath tomorrow morning.  Active problems Essential hypertension-continue metoprolol, Imdur  COPD-no wheezing, this is stable  Tobacco use-2 packs a day.  Continue nicotine patch  Obesity, class II-BMI 36  OSA-continue CPAP  Scheduled Meds:  aspirin EC  81 mg Oral Daily   atorvastatin  80 mg Oral Daily   isosorbide mononitrate  30 mg Oral Daily   metoprolol tartrate  25 mg Oral Daily   mometasone-formoterol  2 puff Inhalation BID   nicotine  21 mg Transdermal Daily   Continuous Infusions:  heparin 1,800 Units/hr (12/29/22 1039)   PRN Meds:.acetaminophen, nitroGLYCERIN, ondansetron (ZOFRAN) IV, perflutren lipid microspheres (DEFINITY) IV suspension  Current Outpatient Medications  Medication Instructions   ALPRAZolam (XANAX) 1 MG tablet TAKE 1 TABLET BY MOUTH 2 TIMES DAILY AS NEEDED.   aspirin EC 81 mg, Oral, Daily, Swallow  whole.   Blood Glucose Monitoring Suppl (ONE TOUCH ULTRA 2) w/Device KIT Use to check blood sugars twice a day   budesonide-formoterol (SYMBICORT) 160-4.5 MCG/ACT inhaler INHALE 2 PUFS INTO THE LUNGS TWICE A DAY   diclofenac (VOLTAREN) 75 mg, Oral, 2 times daily PRN, Take 75 mg by mouth 2 (two) times daily as needed.   glucose blood (ONETOUCH ULTRA) test strip Use to check blood sugars twice a day   isosorbide mononitrate (IMDUR) 30 mg, Oral, Daily   metFORMIN (GLUCOPHAGE) 500 mg, Oral, Daily with breakfast   metoprolol tartrate (LOPRESSOR) 25 mg, Oral, Daily, Take 25 mg by mouth daily.   nitroGLYCERIN (NITROSTAT) 0.4 mg, Sublingual, Every 5 min PRN   OneTouch UltraSoft 2 Lancets MISC 1 Lancet, Does not apply, 2 times daily    Diet Orders (From admission, onward)     Start     Ordered   12/28/22 1958  Diet Heart Room service appropriate? Yes; Fluid consistency: Thin  Diet effective now       Question Answer Comment  Room service appropriate? Yes   Fluid consistency: Thin      12/28/22 2003            DVT prophylaxis:    Lab Results  Component Value Date   PLT 135 (L) 12/29/2022      Code Status: Full Code  Family Communication: no family at bedside   Status is: Observation The patient will require care spanning > 2 midnights and should be moved to inpatient because: NSTEMI   Level of care: Telemetry Cardiac  Consultants:  Cardiology   Objective:  Vitals:   12/28/22 2237 12/28/22 2343 12/29/22 0254 12/29/22 0736  BP:  133/81 130/67 (!) 149/83  Pulse: 81 71 74 91  Resp: 16 17  17   Temp:  97.7 F (36.5 C) 98 F (36.7 C) 97.7 F (36.5 C)  TempSrc:  Axillary Axillary Oral  SpO2: 93% 92% 91% 92%  Weight:      Height:        Intake/Output Summary (Last 24 hours) at 12/29/2022 1040 Last data filed at 12/29/2022 0738 Gross per 24 hour  Intake 946.81 ml  Output 200 ml  Net 746.81 ml   Wt Readings from Last 3 Encounters:  12/28/22 123.2 kg  10/31/22 128.4  kg  07/31/22 131.5 kg    Examination:  Constitutional: NAD Eyes: no scleral icterus ENMT: Mucous membranes are moist.  Neck: normal, supple Respiratory: clear to auscultation bilaterally, no wheezing, no crackles. Normal respiratory effort. No accessory muscle use.  Cardiovascular: Regular rate and rhythm, no murmurs / rubs / gallops. No LE edema.  Abdomen: non distended, no tenderness. Bowel sounds positive.  Musculoskeletal: no clubbing / cyanosis.   Data Reviewed: I have independently reviewed following labs and imaging studies   CBC Recent Labs  Lab 12/28/22 0612 12/29/22 0336  WBC 9.9 6.3  HGB 17.7* 16.2  HCT 50.4 47.7  PLT 142* 135*  MCV 89.2 92.1  MCH 31.3 31.3  MCHC 35.1 34.0  RDW 12.8 12.9    Recent Labs  Lab 12/28/22 0612 12/29/22 0336  NA 140 140  K 4.1 3.9  CL 105 104  CO2 25 24  GLUCOSE 186* 159*  BUN 15 15  CREATININE 0.77 0.79  CALCIUM 8.9 8.8*  HGBA1C  --  6.6*  BNP 32.3  --     ------------------------------------------------------------------------------------------------------------------ Recent Labs    12/29/22 0336  CHOL 153  HDL 33*  LDLCALC 84  TRIG 657*  CHOLHDL 4.6    Lab Results  Component Value Date   HGBA1C 6.6 (H) 12/29/2022   ------------------------------------------------------------------------------------------------------------------ No results for input(s): "TSH", "T4TOTAL", "T3FREE", "THYROIDAB" in the last 72 hours.  Invalid input(s): "FREET3"  Cardiac Enzymes No results for input(s): "CKMB", "TROPONINI", "MYOGLOBIN" in the last 168 hours.  Invalid input(s): "CK" ------------------------------------------------------------------------------------------------------------------    Component Value Date/Time   BNP 32.3 12/28/2022 0612    CBG: No results for input(s): "GLUCAP" in the last 168 hours.  No results found for this or any previous visit (from the past 240 hour(s)).   Radiology Studies: No  results found.   Pamella Pert, MD, PhD [facility]  Between 7 am - 7 pm I am available, please contact me via Amion (for emergencies) or Securechat (non urgent messages)  Between 7 pm - 7 am I am not available, please contact night coverage MD/APP via Amion
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
## 2022-12-29 NOTE — Plan of Care (Signed)
Problem: Education: Goal: Knowledge of General Education information will improve Description: Including pain rating scale, medication(s)/side effects and non-pharmacologic comfort measures 12/29/2022 0230 by Peter Minium, RN Outcome: Progressing 12/29/2022 0230 by Peter Minium, RN Outcome: Progressing   Problem: Health Behavior/Discharge Planning: Goal: Ability to manage health-related needs will improve 12/29/2022 0230 by Peter Minium, RN Outcome: Progressing 12/29/2022 0230 by Peter Minium, RN Outcome: Progressing   Problem: Clinical Measurements: Goal: Ability to maintain clinical measurements within normal limits will improve 12/29/2022 0230 by Peter Minium, RN Outcome: Progressing 12/29/2022 0230 by Gean Quint D, RN Outcome: Progressing Goal: Will remain free from infection 12/29/2022 0230 by Peter Minium, RN Outcome: Progressing 12/29/2022 0230 by Gean Quint D, RN Outcome: Progressing Goal: Diagnostic test results will improve 12/29/2022 0230 by Peter Minium, RN Outcome: Progressing 12/29/2022 0230 by Gean Quint D, RN Outcome: Progressing Goal: Respiratory complications will improve 12/29/2022 0230 by Peter Minium, RN Outcome: Progressing 12/29/2022 0230 by Gean Quint D, RN Outcome: Progressing Goal: Cardiovascular complication will be avoided 12/29/2022 0230 by Peter Minium, RN Outcome: Progressing 12/29/2022 0230 by Peter Minium, RN Outcome: Progressing   Problem: Activity: Goal: Risk for activity intolerance will decrease 12/29/2022 0230 by Peter Minium, RN Outcome: Progressing 12/29/2022 0230 by Peter Minium, RN Outcome: Progressing   Problem: Nutrition: Goal: Adequate nutrition will be maintained 12/29/2022 0230 by Peter Minium, RN Outcome: Progressing 12/29/2022 0230 by Gean Quint D, RN Outcome: Progressing   Problem: Coping: Goal: Level of anxiety will decrease 12/29/2022 0230 by Peter Minium, RN Outcome: Progressing 12/29/2022 0230 by Gean Quint D, RN Outcome: Progressing   Problem: Elimination: Goal: Will not experience complications related to bowel motility 12/29/2022 0230 by Peter Minium, RN Outcome: Progressing 12/29/2022 0230 by Gean Quint D, RN Outcome: Progressing Goal: Will not experience complications related to urinary retention 12/29/2022 0230 by Peter Minium, RN Outcome: Progressing 12/29/2022 0230 by Peter Minium, RN Outcome: Progressing   Problem: Pain Managment: Goal: General experience of comfort will improve 12/29/2022 0230 by Peter Minium, RN Outcome: Progressing 12/29/2022 0230 by Peter Minium, RN Outcome: Progressing   Problem: Safety: Goal: Ability to remain free from injury will improve 12/29/2022 0230 by Peter Minium, RN Outcome: Progressing 12/29/2022 0230 by Gean Quint D, RN Outcome: Progressing   Problem: Skin Integrity: Goal: Risk for impaired skin integrity will decrease 12/29/2022 0230 by Peter Minium, RN Outcome: Progressing 12/29/2022 0230 by Peter Minium, RN Outcome: Progressing   Problem: Education: Goal: Understanding of CV disease, CV risk reduction, and recovery process will improve 12/29/2022 0230 by Peter Minium, RN Outcome: Progressing 12/29/2022 0230 by Peter Minium, RN Outcome: Progressing Goal: Individualized Educational Video(s) 12/29/2022 0230 by Peter Minium, RN Outcome: Progressing 12/29/2022 0230 by Peter Minium, RN Outcome: Progressing   Problem: Activity: Goal: Ability to return to baseline activity level will improve 12/29/2022 0230 by Peter Minium, RN Outcome: Progressing 12/29/2022 0230 by Peter Minium, RN Outcome: Progressing   Problem: Cardiovascular: Goal: Ability to achieve and maintain adequate cardiovascular perfusion will improve 12/29/2022 0230 by Peter Minium, RN Outcome: Progressing 12/29/2022 0230 by Peter Minium,  RN Outcome: Progressing Goal: Vascular access site(s) Level 0-1 will be maintained 12/29/2022 0230 by Peter Minium, RN Outcome: Progressing 12/29/2022 0230 by Peter Minium, RN Outcome: Progressing   Problem: Health Behavior/Discharge Planning: Goal: Ability to safely manage health-related needs after  discharge will improve 12/29/2022 0230 by Peter Minium, RN Outcome: Progressing 12/29/2022 0230 by Peter Minium, RN Outcome: Progressing   Problem: Education: Goal: Understanding of cardiac disease, CV risk reduction, and recovery process will improve 12/29/2022 0230 by Peter Minium, RN Outcome: Progressing 12/29/2022 0230 by Peter Minium, RN Outcome: Progressing Goal: Individualized Educational Video(s) 12/29/2022 0230 by Peter Minium, RN Outcome: Progressing 12/29/2022 0230 by Peter Minium, RN Outcome: Progressing   Problem: Activity: Goal: Ability to tolerate increased activity will improve Outcome: Progressing   Problem: Cardiac: Goal: Ability to achieve and maintain adequate cardiovascular perfusion will improve Outcome: Progressing   Problem: Health Behavior/Discharge Planning: Goal: Ability to safely manage health-related needs after discharge will improve Outcome: Progressing
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
## 2022-12-30 ENCOUNTER — Encounter (HOSPITAL_COMMUNITY)
Admission: EM | Disposition: A | Discharge status: Home / Self Care | Payer: Self-pay | Source: Home / Self Care | Attending: Thoracic Surgery (Cardiothoracic Vascular Surgery)

## 2022-12-30 DIAGNOSIS — I2510 Atherosclerotic heart disease of native coronary artery without angina pectoris: Secondary | ICD-10-CM

## 2022-12-30 DIAGNOSIS — E785 Hyperlipidemia, unspecified: Secondary | ICD-10-CM | POA: Diagnosis not present

## 2022-12-30 DIAGNOSIS — I10 Essential (primary) hypertension: Secondary | ICD-10-CM | POA: Diagnosis not present

## 2022-12-30 DIAGNOSIS — I214 Non-ST elevation (NSTEMI) myocardial infarction: Secondary | ICD-10-CM | POA: Diagnosis not present

## 2022-12-30 DIAGNOSIS — Z720 Tobacco use: Secondary | ICD-10-CM

## 2022-12-30 HISTORY — PX: LEFT HEART CATH AND CORONARY ANGIOGRAPHY: CATH118249

## 2022-12-30 LAB — CBC
HCT: 45.1 % (ref 39.0–52.0)
Hemoglobin: 15.7 g/dL (ref 13.0–17.0)
MCH: 31.9 pg (ref 26.0–34.0)
MCHC: 34.8 g/dL (ref 30.0–36.0)
MCV: 91.7 fL (ref 80.0–100.0)
Platelets: 125 10*3/uL — ABNORMAL LOW (ref 150–400)
RBC: 4.92 MIL/uL (ref 4.22–5.81)
RDW: 12.9 % (ref 11.5–15.5)
WBC: 5.5 10*3/uL (ref 4.0–10.5)
nRBC: 0 % (ref 0.0–0.2)

## 2022-12-30 LAB — ERYTHROPOIETIN: Erythropoietin: 17.6 m[IU]/mL (ref 2.6–18.5)

## 2022-12-30 LAB — HEPARIN LEVEL (UNFRACTIONATED): Heparin Unfractionated: 0.39 [IU]/mL (ref 0.30–0.70)

## 2022-12-30 SURGERY — LEFT HEART CATH AND CORONARY ANGIOGRAPHY
Anesthesia: LOCAL

## 2022-12-30 MED ORDER — FENTANYL CITRATE (PF) 100 MCG/2ML IJ SOLN
INTRAMUSCULAR | Status: AC
  Filled 2022-12-30: qty 2

## 2022-12-30 MED ORDER — LIDOCAINE HCL (PF) 1 % IJ SOLN
INTRAMUSCULAR | Status: DC | PRN
  Administered 2022-12-30: 2 mL

## 2022-12-30 MED ORDER — HEPARIN (PORCINE) 25000 UT/250ML-% IV SOLN: 2100.0000 [IU]/h | INTRAVENOUS | Status: DC

## 2022-12-30 MED ORDER — HEPARIN (PORCINE) 25000 UT/250ML-% IV SOLN
2100.0000 [IU]/h | INTRAVENOUS | Status: DC
  Administered 2022-12-30 – 2023-01-01 (×5): 2100 [IU]/h via INTRAVENOUS
  Filled 2022-12-30 (×4): qty 250

## 2022-12-30 MED ORDER — HYDRALAZINE HCL 20 MG/ML IJ SOLN: 10.0000 mg | INTRAMUSCULAR | Status: AC | PRN

## 2022-12-30 MED ORDER — SODIUM CHLORIDE 0.9% FLUSH
3.0000 mL | Freq: Two times a day (BID) | INTRAVENOUS | Status: DC
  Administered 2022-12-31 – 2023-01-04 (×7): 3 mL via INTRAVENOUS

## 2022-12-30 MED ORDER — HEPARIN SODIUM (PORCINE) 1000 UNIT/ML IJ SOLN
INTRAMUSCULAR | Status: AC
  Filled 2022-12-30: qty 10

## 2022-12-30 MED ORDER — VERAPAMIL HCL 2.5 MG/ML IV SOLN
INTRAVENOUS | Status: AC
  Filled 2022-12-30: qty 2

## 2022-12-30 MED ORDER — HEPARIN (PORCINE) IN NACL 1000-0.9 UT/500ML-% IV SOLN
INTRAVENOUS | Status: DC | PRN
  Administered 2022-12-30 (×2): 500 mL

## 2022-12-30 MED ORDER — SODIUM CHLORIDE 0.9% FLUSH: 3.0000 mL | INTRAVENOUS | Status: DC | PRN

## 2022-12-30 MED ORDER — HEPARIN SODIUM (PORCINE) 1000 UNIT/ML IJ SOLN
INTRAMUSCULAR | Status: DC | PRN
  Administered 2022-12-30: 6000 [IU] via INTRAVENOUS

## 2022-12-30 MED ORDER — LABETALOL HCL 5 MG/ML IV SOLN: 10.0000 mg | INTRAVENOUS | Status: AC | PRN

## 2022-12-30 MED ORDER — FENTANYL CITRATE (PF) 100 MCG/2ML IJ SOLN
INTRAMUSCULAR | Status: DC | PRN
  Administered 2022-12-30: 25 ug via INTRAVENOUS

## 2022-12-30 MED ORDER — SODIUM CHLORIDE 0.9 % IV SOLN
INTRAVENOUS | Status: AC | PRN
  Administered 2022-12-30: 10 mL/h via INTRAVENOUS

## 2022-12-30 MED ORDER — IOHEXOL 350 MG/ML SOLN
INTRAVENOUS | Status: DC | PRN
  Administered 2022-12-30: 45 mL

## 2022-12-30 MED ORDER — MIDAZOLAM HCL 2 MG/2ML IJ SOLN
INTRAMUSCULAR | Status: AC
  Filled 2022-12-30: qty 2

## 2022-12-30 MED ORDER — VERAPAMIL HCL 2.5 MG/ML IV SOLN
INTRAVENOUS | Status: DC | PRN
  Administered 2022-12-30: 10 mL via INTRA_ARTERIAL

## 2022-12-30 MED ORDER — LIDOCAINE HCL (PF) 1 % IJ SOLN
INTRAMUSCULAR | Status: AC
  Filled 2022-12-30: qty 30

## 2022-12-30 MED ORDER — SODIUM CHLORIDE 0.9 % IV SOLN: 250.0000 mL | INTRAVENOUS | Status: DC | PRN

## 2022-12-30 MED ORDER — MIDAZOLAM HCL 2 MG/2ML IJ SOLN
INTRAMUSCULAR | Status: DC | PRN
  Administered 2022-12-30: 2 mg via INTRAVENOUS

## 2022-12-30 MED ORDER — SODIUM CHLORIDE 0.9 % IV SOLN: INTRAVENOUS | Status: AC

## 2022-12-30 SURGICAL SUPPLY — 9 items
CATH INFINITI 5FR ANG PIGTAIL (CATHETERS) IMPLANT
CATH INFINITI AMBI 5FR TG (CATHETERS) IMPLANT
DEVICE RAD COMP TR BAND LRG (VASCULAR PRODUCTS) IMPLANT
GLIDESHEATH SLEND SS 6F .021 (SHEATH) IMPLANT
KIT SYRINGE INJ CVI SPIKEX1 (MISCELLANEOUS) IMPLANT
PACK CARDIAC CATHETERIZATION (CUSTOM PROCEDURE TRAY) ×1 IMPLANT
SET ATX-X65L (MISCELLANEOUS) IMPLANT
SHEATH PROBE COVER 6X72 (BAG) IMPLANT
WIRE EMERALD 3MM-J .035X260CM (WIRE) IMPLANT

## 2022-12-30 NOTE — Interval H&P Note (Signed)
History and Physical Interval Note:  12/30/2022 1:51 PM  [name]  has presented today for surgery, with the diagnosis of NSTEMI.  The various methods of treatment have been discussed with the patient and family. After consideration of risks, benefits and other options for treatment, the patient has consented to  Procedure(s): LEFT HEART CATH AND CORONARY ANGIOGRAPHY (N/A)  PERCUTANEOUS CORONARY INTERVENTION   as a surgical intervention.  The patient's history has been reviewed, patient examined, no change in status, stable for surgery.  I have reviewed the patient's chart and labs.  Questions were answered to the patient's satisfaction.    Cath Lab Visit (complete for each Cath Lab visit)  Clinical Evaluation Leading to the Procedure:   ACS: Yes.    Non-ACS:    Anginal Classification: CCS IV  Anti-ischemic medical therapy: Minimal Therapy (1 class of medications)  Non-Invasive Test Results: No non-invasive testing performed  Prior CABG: No previous CABG     Bryan Lemma
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
## 2022-12-30 NOTE — Progress Notes (Addendum)
Rounding Note    Patient Name: [name] Date of Encounter: 12/30/2022  [facility] Cardiologist: Norman Herrlich, MD   Subjective   No complaints today other than being upset at the timing of his heart cath. Left the floor to smoke without telemetry and with heparin gtt running.  Inpatient Medications    Scheduled Meds:  aspirin EC  81 mg Oral Daily   atorvastatin  80 mg Oral Daily   isosorbide mononitrate  30 mg Oral Daily   metoprolol tartrate  25 mg Oral Daily   mometasone-formoterol  2 puff Inhalation BID   nicotine  21 mg Transdermal Daily   polyethylene glycol  17 g Oral BID   Continuous Infusions:  sodium chloride 1 mL/kg/hr (12/30/22 9563)   heparin 2,100 Units/hr (12/30/22 1212)   PRN Meds: acetaminophen, naphazoline-pheniramine, nitroGLYCERIN, ondansetron (ZOFRAN) IV   Vital Signs    Vitals:   12/30/22 0018 12/30/22 0441 12/30/22 0731 12/30/22 1119  BP: (!) 140/76 (!) 152/80 (!) 155/81 (!) 151/84  Pulse: 68 80 76 65  Resp: 20 17 (!) 23 18  Temp: 97.7 F (36.5 C) 98.5 F (36.9 C) 97.8 F (36.6 C) 98 F (36.7 C)  TempSrc: Axillary Oral Oral Oral  SpO2: 95% 92% 92% 93%  Weight:      Height:        Intake/Output Summary (Last 24 hours) at 12/30/2022 1245 Last data filed at 12/30/2022 1015 Gross per 24 hour  Intake 707.6 ml  Output --  Net 707.6 ml      12/28/2022    4:00 PM 12/28/2022    6:02 AM 10/31/2022    8:01 AM  Last 3 Weights  Weight (lbs) 271 lb 9.7 oz 280 lb 283 lb  Weight (kg) 123.2 kg 127.007 kg 128.368 kg      Telemetry    Sinus rhythm with HR 70s - Personally Reviewed  ECG    No new tracings - Personally Reviewed  Physical Exam   GEN: No acute distress.   Neck: No JVD Cardiac: RRR, no murmurs, rubs, or gallops.  Respiratory: Clear to auscultation bilaterally. GI: Soft, nontender, non-distended  MS: No edema; No deformity. Neuro:  Nonfocal  Psych: Normal affect   Labs    High Sensitivity  Troponin:   Recent Labs  Lab 12/28/22 0612 12/28/22 0747 12/28/22 1417  TROPONINIHS 16 76* 1,078*     Chemistry Recent Labs  Lab 12/28/22 0612 12/29/22 0336  NA 140 140  K 4.1 3.9  CL 105 104  CO2 25 24  GLUCOSE 186* 159*  BUN 15 15  CREATININE 0.77 0.79  CALCIUM 8.9 8.8*  GFRNONAA >60 >60  ANIONGAP 10 12    Lipids  Recent Labs  Lab 12/29/22 0336  CHOL 153  TRIG 180*  HDL 33*  LDLCALC 84  CHOLHDL 4.6    Hematology Recent Labs  Lab 12/28/22 0612 12/29/22 0336 12/30/22 0125  WBC 9.9 6.3 5.5  RBC 5.65 5.18 4.92  HGB 17.7* 16.2 15.7  HCT 50.4 47.7 45.1  MCV 89.2 92.1 91.7  MCH 31.3 31.3 31.9  MCHC 35.1 34.0 34.8  RDW 12.8 12.9 12.9  PLT 142* 135* 125*   Thyroid No results for input(s): "TSH", "FREET4" in the last 168 hours.  BNP Recent Labs  Lab 12/28/22 0612  BNP 32.3    DDimer No results for input(s): "DDIMER" in the last 168 hours.   Radiology    ECHOCARDIOGRAM COMPLETE  Result Date: 12/29/2022  ECHOCARDIOGRAM REPORT   Patient Name:   [name] Date of Exam: 12/29/2022 Medical Rec #:  [id number]                 Height:       72.0 in Accession #:    [id number]                Weight:       271.6 lb Date of Birth:  1961-04-12                BSA:          2.427 m Patient Age:    61 years                  BP:           149/83 mmHg Patient Gender: M                         HR:           65 bpm. Exam Location:  Inpatient Procedure: 2D Echo, Cardiac Doppler, Color Doppler and Intracardiac            Opacification Agent Indications:    NSTEMI I21.4  History:        Patient has prior history of Echocardiogram examinations and                 Patient has no prior history of Echocardiogram examinations.                 CAD; Risk Factors:Dyslipidemia and Diabetes.  Sonographer:    Harriette Bouillon RDCS Referring Phys: [id number] Cecille Po MELVIN IMPRESSIONS  1. Left ventricular ejection fraction, by estimation, is 55 to 60%. The left ventricle has normal  function. The left ventricle has no regional wall motion abnormalities. The left ventricular internal cavity size was mildly to moderately dilated. Left ventricular diastolic parameters are consistent with Grade I diastolic dysfunction (impaired relaxation).  2. Right ventricular systolic function is normal. The right ventricular size is normal. Tricuspid regurgitation signal is inadequate for assessing PA pressure.  3. The mitral valve is grossly normal. Trivial mitral valve regurgitation. No evidence of mitral stenosis.  4. The aortic valve is tricuspid. Aortic valve regurgitation is not visualized. No aortic stenosis is present. FINDINGS  Left Ventricle: Left ventricular ejection fraction, by estimation, is 55 to 60%. The left ventricle has normal function. The left ventricle has no regional wall motion abnormalities. Definity contrast agent was given IV to delineate the left ventricular  endocardial borders. The left ventricular internal cavity size was mildly to moderately dilated. There is no left ventricular hypertrophy. Left ventricular diastolic parameters are consistent with Grade I diastolic dysfunction (impaired relaxation). Right Ventricle: The right ventricular size is normal. No increase in right ventricular wall thickness. Right ventricular systolic function is normal. Tricuspid regurgitation signal is inadequate for assessing PA pressure. Left Atrium: Left atrial size was normal in size. Right Atrium: Right atrial size was normal in size. Pericardium: There is no evidence of pericardial effusion. Presence of epicardial fat layer. Mitral Valve: The mitral valve is grossly normal. Trivial mitral valve regurgitation. No evidence of mitral valve stenosis. Tricuspid Valve: The tricuspid valve is grossly normal. Tricuspid valve regurgitation is trivial. No evidence of tricuspid stenosis. Aortic Valve: The aortic valve is tricuspid. Aortic valve regurgitation is not visualized. No aortic stenosis is  present. Pulmonic Valve: The pulmonic valve was grossly normal.  Pulmonic valve regurgitation is not visualized. No evidence of pulmonic stenosis. Aorta: The aortic root and ascending aorta are structurally normal, with no evidence of dilitation. Venous: The inferior vena cava was not well visualized. IAS/Shunts: The atrial septum is grossly normal.  LEFT VENTRICLE PLAX 2D LVIDd:         6.30 cm   Diastology LVIDs:         5.00 cm   LV e' medial:   5.22 cm/s LV PW:         1.00 cm   LV E/e' medial: 9.5 LV IVS:        1.00 cm LVOT diam:     2.60 cm LV SV:         90 LV SV Index:   37 LVOT Area:     5.31 cm  RIGHT VENTRICLE TAPSE (M-mode): 1.8 cm LEFT ATRIUM             Index        RIGHT ATRIUM           Index LA diam:        4.80 cm 1.98 cm/m   RA Area:     12.40 cm LA Vol (A2C):   49.1 ml 20.23 ml/m  RA Volume:   24.10 ml  9.93 ml/m LA Vol (A4C):   78.7 ml 32.43 ml/m LA Biplane Vol: 63.3 ml 26.09 ml/m  AORTIC VALVE LVOT Vmax:   86.50 cm/s LVOT Vmean:  63.600 cm/s LVOT VTI:    0.170 m  AORTA Ao Root diam: 3.60 cm Ao Asc diam:  3.40 cm MITRAL VALVE MV Area (PHT): 2.43 cm    SHUNTS MV Decel Time: 312 msec    Systemic VTI:  0.17 m MV E velocity: 49.40 cm/s  Systemic Diam: 2.60 cm MV A velocity: 74.50 cm/s MV E/A ratio:  0.66 Lennie Odor MD Electronically signed by Lennie Odor MD Signature Date/Time: 01-07-23/10:55:44 AM    Final     Cardiac Studies   Heart cath today  Echo 2023/01/07: 1. Left ventricular ejection fraction, by estimation, is 55 to 60%. The  left ventricle has normal function. The left ventricle has no regional  wall motion abnormalities. The left ventricular internal cavity size was  mildly to moderately dilated. Left  ventricular diastolic parameters are consistent with Grade I diastolic  dysfunction (impaired relaxation).   2. Right ventricular systolic function is normal. The right ventricular  size is normal. Tricuspid regurgitation signal is inadequate for assessing  PA  pressure.   3. The mitral valve is grossly normal. Trivial mitral valve  regurgitation. No evidence of mitral stenosis.   4. The aortic valve is tricuspid. Aortic valve regurgitation is not  visualized. No aortic stenosis is present.    Patient Profile     62 y.o. male with medical history significant of hypertension, hyperlipidemia, GERD, diabetes, COPD, childhood epilepsy, chronic hepatitis C, obesity, OSA, CAD status post MI, cocaine use, anxiety, low back pain presenting with chest pain.   Assessment & Plan    NSTEMI CAD - nonobstructive CAD in 2022 - presented with CP concerning for angina with elevated troponin - admitted with heparin gtt running, continue ASA, BB, statin - scheduled for heart cath today   Hypertension - on imdur 30 mg, BB - pressure remains elevated - will start ARB after heart cath today   Hyperlipidemia with LDL goal < 70 01/07/23: Cholesterol 153; HDL 33; LDL Cholesterol 84; Triglycerides 180; VLDL 36 - new  80 mg statin   Pulmonary nodule Tobacco abuse - has left the hospital to smoke outside with heparin gtt running and telemetry disconnected - has been offered nicotine patch      For questions or updates, please contact [facility] Please consult [email or web address] for contact info under        Signed, Marcelino Duster, PA  12/30/2022, 12:45 PM     ATTENDING ATTESTATION:  After conducting a review of all available clinical information with the care team, interviewing the patient, and performing a physical exam, I agree with the findings and plan described in this note.   GEN: No acute distress.   HEENT:  MMM, no JVD, no scleral icterus Cardiac: RRR, no murmurs, rubs, or gallops.  Respiratory: Clear to auscultation bilaterally. GI: Soft, nontender, non-distended  MS: No edema; No deformity. Neuro:  Nonfocal  Vasc:  +2 radial pulses  Patient seen after coronary angiography study today which demonstrated LAD, right  coronary artery, and diagonal disease.  No interventions were pursued and a cardiothoracic surgery consultation will be obtained.  The patient is chest pain-free.  Will await surgical opinion about candidacy for surgical revascularization.  Continue current therapy.  Alverda Skeans, MD Pager [phone]

## 2022-12-30 NOTE — Progress Notes (Signed)
Patient wife came to nurses station wanting to know if we had a time for her husbands procedure. She was informed that we had no time yet, but he is on the schedule.

## 2022-12-30 NOTE — Progress Notes (Signed)
Patient walked off unit with wife to go outside. Patient stated "I don't care, I'm going outside". Patient left unit with heparin drip running and no telemetry monitor on. On call cardiology provider paged. Provider called back and was made aware of what the patient did.

## 2022-12-30 NOTE — Plan of Care (Signed)

## 2022-12-30 NOTE — Progress Notes (Signed)
   12/29/22 2300  BiPAP/CPAP/SIPAP  BiPAP/CPAP/SIPAP Pt Type Adult  BiPAP/CPAP/SIPAP Resmed  Mask Type Full face mask  Mask Size Large  Respiratory Rate 19 breaths/min  IPAP  (max 22)  EPAP  (min 8)  Pressure Support 6 cmH20  Patient Home Equipment No  Auto Titrate Yes  BiPAP/CPAP /SiPAP Vitals  Pulse Rate 64  Resp 19  Bilateral Breath Sounds Clear;Diminished  MEWS Score/Color  MEWS Score 0  MEWS Score Color Green   Patient placed self on BiPAP.

## 2022-12-30 NOTE — H&P (View-Only) (Signed)
Rounding Note    Patient Name: [name] Date of Encounter: 12/30/2022  [facility] Cardiologist: Norman Herrlich, MD   Subjective   No complaints today other than being upset at the timing of his heart cath. Left the floor to smoke without telemetry and with heparin gtt running.  Inpatient Medications    Scheduled Meds:  aspirin EC  81 mg Oral Daily   atorvastatin  80 mg Oral Daily   isosorbide mononitrate  30 mg Oral Daily   metoprolol tartrate  25 mg Oral Daily   mometasone-formoterol  2 puff Inhalation BID   nicotine  21 mg Transdermal Daily   polyethylene glycol  17 g Oral BID   Continuous Infusions:  sodium chloride 1 mL/kg/hr (12/30/22 9629)   heparin 2,100 Units/hr (12/30/22 1212)   PRN Meds: acetaminophen, naphazoline-pheniramine, nitroGLYCERIN, ondansetron (ZOFRAN) IV   Vital Signs    Vitals:   12/30/22 0018 12/30/22 0441 12/30/22 0731 12/30/22 1119  BP: (!) 140/76 (!) 152/80 (!) 155/81 (!) 151/84  Pulse: 68 80 76 65  Resp: 20 17 (!) 23 18  Temp: 97.7 F (36.5 C) 98.5 F (36.9 C) 97.8 F (36.6 C) 98 F (36.7 C)  TempSrc: Axillary Oral Oral Oral  SpO2: 95% 92% 92% 93%  Weight:      Height:        Intake/Output Summary (Last 24 hours) at 12/30/2022 1245 Last data filed at 12/30/2022 1015 Gross per 24 hour  Intake 707.6 ml  Output --  Net 707.6 ml      12/28/2022    4:00 PM 12/28/2022    6:02 AM 10/31/2022    8:01 AM  Last 3 Weights  Weight (lbs) 271 lb 9.7 oz 280 lb 283 lb  Weight (kg) 123.2 kg 127.007 kg 128.368 kg      Telemetry    Sinus rhythm with HR 70s - Personally Reviewed  ECG    No new tracings - Personally Reviewed  Physical Exam   GEN: No acute distress.   Neck: No JVD Cardiac: RRR, no murmurs, rubs, or gallops.  Respiratory: Clear to auscultation bilaterally. GI: Soft, nontender, non-distended  MS: No edema; No deformity. Neuro:  Nonfocal  Psych: Normal affect   Labs    High Sensitivity  Troponin:   Recent Labs  Lab 12/28/22 0612 12/28/22 0747 12/28/22 1417  TROPONINIHS 16 76* 1,078*     Chemistry Recent Labs  Lab 12/28/22 0612 12/29/22 0336  NA 140 140  K 4.1 3.9  CL 105 104  CO2 25 24  GLUCOSE 186* 159*  BUN 15 15  CREATININE 0.77 0.79  CALCIUM 8.9 8.8*  GFRNONAA >60 >60  ANIONGAP 10 12    Lipids  Recent Labs  Lab 12/29/22 0336  CHOL 153  TRIG 180*  HDL 33*  LDLCALC 84  CHOLHDL 4.6    Hematology Recent Labs  Lab 12/28/22 0612 12/29/22 0336 12/30/22 0125  WBC 9.9 6.3 5.5  RBC 5.65 5.18 4.92  HGB 17.7* 16.2 15.7  HCT 50.4 47.7 45.1  MCV 89.2 92.1 91.7  MCH 31.3 31.3 31.9  MCHC 35.1 34.0 34.8  RDW 12.8 12.9 12.9  PLT 142* 135* 125*   Thyroid No results for input(s): "TSH", "FREET4" in the last 168 hours.  BNP Recent Labs  Lab 12/28/22 0612  BNP 32.3    DDimer No results for input(s): "DDIMER" in the last 168 hours.   Radiology    ECHOCARDIOGRAM COMPLETE  Result Date: 12/29/2022  ECHOCARDIOGRAM REPORT   Patient Name:   [name] Date of Exam: 12/29/2022 Medical Rec #:  [id number]                 Height:       72.0 in Accession #:    [id number]                Weight:       271.6 lb Date of Birth:  24-Jan-1961                BSA:          2.427 m Patient Age:    61 years                  BP:           149/83 mmHg Patient Gender: M                         HR:           65 bpm. Exam Location:  Inpatient Procedure: 2D Echo, Cardiac Doppler, Color Doppler and Intracardiac            Opacification Agent Indications:    NSTEMI I21.4  History:        Patient has prior history of Echocardiogram examinations and                 Patient has no prior history of Echocardiogram examinations.                 CAD; Risk Factors:Dyslipidemia and Diabetes.  Sonographer:    Harriette Bouillon RDCS Referring Phys: [id number] Cecille Po MELVIN IMPRESSIONS  1. Left ventricular ejection fraction, by estimation, is 55 to 60%. The left ventricle has normal  function. The left ventricle has no regional wall motion abnormalities. The left ventricular internal cavity size was mildly to moderately dilated. Left ventricular diastolic parameters are consistent with Grade I diastolic dysfunction (impaired relaxation).  2. Right ventricular systolic function is normal. The right ventricular size is normal. Tricuspid regurgitation signal is inadequate for assessing PA pressure.  3. The mitral valve is grossly normal. Trivial mitral valve regurgitation. No evidence of mitral stenosis.  4. The aortic valve is tricuspid. Aortic valve regurgitation is not visualized. No aortic stenosis is present. FINDINGS  Left Ventricle: Left ventricular ejection fraction, by estimation, is 55 to 60%. The left ventricle has normal function. The left ventricle has no regional wall motion abnormalities. Definity contrast agent was given IV to delineate the left ventricular  endocardial borders. The left ventricular internal cavity size was mildly to moderately dilated. There is no left ventricular hypertrophy. Left ventricular diastolic parameters are consistent with Grade I diastolic dysfunction (impaired relaxation). Right Ventricle: The right ventricular size is normal. No increase in right ventricular wall thickness. Right ventricular systolic function is normal. Tricuspid regurgitation signal is inadequate for assessing PA pressure. Left Atrium: Left atrial size was normal in size. Right Atrium: Right atrial size was normal in size. Pericardium: There is no evidence of pericardial effusion. Presence of epicardial fat layer. Mitral Valve: The mitral valve is grossly normal. Trivial mitral valve regurgitation. No evidence of mitral valve stenosis. Tricuspid Valve: The tricuspid valve is grossly normal. Tricuspid valve regurgitation is trivial. No evidence of tricuspid stenosis. Aortic Valve: The aortic valve is tricuspid. Aortic valve regurgitation is not visualized. No aortic stenosis is  present. Pulmonic Valve: The pulmonic valve was grossly normal.  Pulmonic valve regurgitation is not visualized. No evidence of pulmonic stenosis. Aorta: The aortic root and ascending aorta are structurally normal, with no evidence of dilitation. Venous: The inferior vena cava was not well visualized. IAS/Shunts: The atrial septum is grossly normal.  LEFT VENTRICLE PLAX 2D LVIDd:         6.30 cm   Diastology LVIDs:         5.00 cm   LV e' medial:   5.22 cm/s LV PW:         1.00 cm   LV E/e' medial: 9.5 LV IVS:        1.00 cm LVOT diam:     2.60 cm LV SV:         90 LV SV Index:   37 LVOT Area:     5.31 cm  RIGHT VENTRICLE TAPSE (M-mode): 1.8 cm LEFT ATRIUM             Index        RIGHT ATRIUM           Index LA diam:        4.80 cm 1.98 cm/m   RA Area:     12.40 cm LA Vol (A2C):   49.1 ml 20.23 ml/m  RA Volume:   24.10 ml  9.93 ml/m LA Vol (A4C):   78.7 ml 32.43 ml/m LA Biplane Vol: 63.3 ml 26.09 ml/m  AORTIC VALVE LVOT Vmax:   86.50 cm/s LVOT Vmean:  63.600 cm/s LVOT VTI:    0.170 m  AORTA Ao Root diam: 3.60 cm Ao Asc diam:  3.40 cm MITRAL VALVE MV Area (PHT): 2.43 cm    SHUNTS MV Decel Time: 312 msec    Systemic VTI:  0.17 m MV E velocity: 49.40 cm/s  Systemic Diam: 2.60 cm MV A velocity: 74.50 cm/s MV E/A ratio:  0.66 Lennie Odor MD Electronically signed by Lennie Odor MD Signature Date/Time: 01-14-2023/10:55:44 AM    Final     Cardiac Studies   Heart cath today  Echo 01-14-2023: 1. Left ventricular ejection fraction, by estimation, is 55 to 60%. The  left ventricle has normal function. The left ventricle has no regional  wall motion abnormalities. The left ventricular internal cavity size was  mildly to moderately dilated. Left  ventricular diastolic parameters are consistent with Grade I diastolic  dysfunction (impaired relaxation).   2. Right ventricular systolic function is normal. The right ventricular  size is normal. Tricuspid regurgitation signal is inadequate for assessing  PA  pressure.   3. The mitral valve is grossly normal. Trivial mitral valve  regurgitation. No evidence of mitral stenosis.   4. The aortic valve is tricuspid. Aortic valve regurgitation is not  visualized. No aortic stenosis is present.    Patient Profile     62 y.o. male with medical history significant of hypertension, hyperlipidemia, GERD, diabetes, COPD, childhood epilepsy, chronic hepatitis C, obesity, OSA, CAD status post MI, cocaine use, anxiety, low back pain presenting with chest pain.   Assessment & Plan    NSTEMI CAD - nonobstructive CAD in 2022 - presented with CP concerning for angina with elevated troponin - admitted with heparin gtt running, continue ASA, BB, statin - scheduled for heart cath today   Hypertension - on imdur 30 mg, BB - pressure remains elevated - will start ARB after heart cath today   Hyperlipidemia with LDL goal < 70 2023-01-14: Cholesterol 153; HDL 33; LDL Cholesterol 84; Triglycerides 180; VLDL 36 - new  80 mg statin   Pulmonary nodule Tobacco abuse - has left the hospital to smoke outside with heparin gtt running and telemetry disconnected - has been offered nicotine patch      For questions or updates, please contact [facility] Please consult [email or web address] for contact info under        Signed, Marcelino Duster, PA  12/30/2022, 12:45 PM

## 2022-12-30 NOTE — Progress Notes (Signed)
PROGRESS NOTE  [name] QMV:[id number] DOB: 28-Nov-1960 DOA: 12/28/2022 PCP: Tresa Garter, MD   LOS: 1 day   Brief Narrative / Interim history: 62 y.o. male with medical history significant of hypertension, hyperlipidemia, GERD, diabetes, COPD, childhood epilepsy, chronic hepatitis C, obesity, OSA, CAD status post MI, cocaine use, anxiety, low back pain presenting with chest pain.  Was found to have an NSTEMI, cardiology consulted  Subjective / 24h Interval events: Anxious about timing for his cath  Assesement and Plan: Principal Problem:   NSTEMI (non-ST elevated myocardial infarction) [facility]) Active Problems:   Morbid obesity (HCC)   Anxiety disorder   Epilepsy (HCC)   HTN (hypertension)   MYOCARDIAL INFARCTION, HX OF   GERD   Low back pain radiating to left lower extremity   COPD mixed type (HCC)   OSA (obstructive sleep apnea)   CAD (coronary artery disease)   Cocaine abuse (HCC)   Pure hypercholesterolemia   Principal problem Non-STEMI, underlying CAD -with prior MI.  Continue heparin, cardiac cath today with multivessel disease, recommendation for CVTS consultation vs multivessel PCI -cards to weigh in post cath -resume heparin  Active problems Essential hypertension-continue metoprolol, Imdur  COPD-no wheezing, this is stable  Tobacco use-2 packs a day.  Continue nicotine patch  Obesity, class II-BMI 36  OSA-continue CPAP  Scheduled Meds:  aspirin EC  81 mg Oral Daily   atorvastatin  80 mg Oral Daily   isosorbide mononitrate  30 mg Oral Daily   metoprolol tartrate  25 mg Oral Daily   mometasone-formoterol  2 puff Inhalation BID   nicotine  21 mg Transdermal Daily   polyethylene glycol  17 g Oral BID   sodium chloride flush  3 mL Intravenous Q12H   Continuous Infusions:  sodium chloride     sodium chloride     PRN Meds:.sodium chloride, acetaminophen, hydrALAZINE, labetalol, naphazoline-pheniramine, nitroGLYCERIN, ondansetron  (ZOFRAN) IV, sodium chloride flush  Current Outpatient Medications  Medication Instructions   ALPRAZolam (XANAX) 1 MG tablet TAKE 1 TABLET BY MOUTH 2 TIMES DAILY AS NEEDED.   aspirin EC 81 mg, Oral, Daily, Swallow whole.   Blood Glucose Monitoring Suppl (ONE TOUCH ULTRA 2) w/Device KIT Use to check blood sugars twice a day   budesonide-formoterol (SYMBICORT) 160-4.5 MCG/ACT inhaler INHALE 2 PUFS INTO THE LUNGS TWICE A DAY   diclofenac (VOLTAREN) 75 mg, Oral, 2 times daily PRN, Take 75 mg by mouth 2 (two) times daily as needed.   glucose blood (ONETOUCH ULTRA) test strip Use to check blood sugars twice a day   isosorbide mononitrate (IMDUR) 30 mg, Oral, Daily   metFORMIN (GLUCOPHAGE) 500 mg, Oral, Daily with breakfast   metoprolol tartrate (LOPRESSOR) 25 mg, Oral, Daily, Take 25 mg by mouth daily.   nitroGLYCERIN (NITROSTAT) 0.4 mg, Sublingual, Every 5 min PRN   OneTouch UltraSoft 2 Lancets MISC 1 Lancet, Does not apply, 2 times daily    Diet Orders (From admission, onward)     Start     Ordered   12/30/22 1459  Diet Heart Room service appropriate? Yes; Fluid consistency: Thin  Diet effective now       Question Answer Comment  Room service appropriate? Yes   Fluid consistency: Thin      12/30/22 1458            DVT prophylaxis:    Lab Results  Component Value Date   PLT 125 (L) 12/30/2022      Code Status: Full Code  Family  Communication: no family at bedside   Status is: Inpatient   Level of care: Telemetry Cardiac  Consultants:  Cardiology   Objective: Vitals:   12/30/22 1432 12/30/22 1437 12/30/22 1442 12/30/22 1448  BP: (!) 162/95 (!) 161/95 (!) 155/103   Pulse: 73 72 67 (!) 0  Resp: (!) 25 20 (!) 22   Temp:      TempSrc:      SpO2: 94% 95% 96%   Weight:      Height:        Intake/Output Summary (Last 24 hours) at 12/30/2022 1509 Last data filed at 12/30/2022 1442 Gross per 24 hour  Intake 2370.8 ml  Output --  Net 2370.8 ml   Wt Readings from  Last 3 Encounters:  12/28/22 123.2 kg  10/31/22 128.4 kg  07/31/22 131.5 kg    Examination:  Constitutional: NAD Eyes: lids and conjunctivae normal, no scleral icterus ENMT: mmm Neck: normal, supple Respiratory: Normal respiratory effort.  Cardiovascular: No LE edema Skin: no rashes   Data Reviewed: I have independently reviewed following labs and imaging studies   CBC Recent Labs  Lab 12/28/22 0612 12/29/22 0336 12/30/22 0125  WBC 9.9 6.3 5.5  HGB 17.7* 16.2 15.7  HCT 50.4 47.7 45.1  PLT 142* 135* 125*  MCV 89.2 92.1 91.7  MCH 31.3 31.3 31.9  MCHC 35.1 34.0 34.8  RDW 12.8 12.9 12.9    Recent Labs  Lab 12/28/22 0612 12/29/22 0336  NA 140 140  K 4.1 3.9  CL 105 104  CO2 25 24  GLUCOSE 186* 159*  BUN 15 15  CREATININE 0.77 0.79  CALCIUM 8.9 8.8*  HGBA1C  --  6.6*  BNP 32.3  --     ------------------------------------------------------------------------------------------------------------------ Recent Labs    12/29/22 0336  CHOL 153  HDL 33*  LDLCALC 84  TRIG 161*  CHOLHDL 4.6    Lab Results  Component Value Date   HGBA1C 6.6 (H) 12/29/2022   ------------------------------------------------------------------------------------------------------------------ No results for input(s): "TSH", "T4TOTAL", "T3FREE", "THYROIDAB" in the last 72 hours.  Invalid input(s): "FREET3"  Cardiac Enzymes No results for input(s): "CKMB", "TROPONINI", "MYOGLOBIN" in the last 168 hours.  Invalid input(s): "CK" ------------------------------------------------------------------------------------------------------------------    Component Value Date/Time   BNP 32.3 12/28/2022 0612    CBG: No results for input(s): "GLUCAP" in the last 168 hours.  No results found for this or any previous visit (from the past 240 hour(s)).   Radiology Studies: No results found.   Pamella Pert, MD, PhD [facility]  Between 7 am - 7 pm I am available, please  contact me via Amion (for emergencies) or Securechat (non urgent messages)  Between 7 pm - 7 am I am not available, please contact night coverage MD/APP via Amion

## 2022-12-30 NOTE — Plan of Care (Signed)
  Problem: Education: Goal: Knowledge of General Education information will improve Description: Including pain rating scale, medication(s)/side effects and non-pharmacologic comfort measures Outcome: Progressing   Problem: Health Behavior/Discharge Planning: Goal: Ability to manage health-related needs will improve Outcome: Progressing   Problem: Clinical Measurements: Goal: Ability to maintain clinical measurements within normal limits will improve Outcome: Progressing Goal: Will remain free from infection Outcome: Progressing Goal: Diagnostic test results will improve Outcome: Progressing Goal: Cardiovascular complication will be avoided Outcome: Progressing   Problem: Activity: Goal: Risk for activity intolerance will decrease Outcome: Progressing   Problem: Nutrition: Goal: Adequate nutrition will be maintained Outcome: Progressing   Problem: Coping: Goal: Level of anxiety will decrease Outcome: Progressing   Problem: Elimination: Goal: Will not experience complications related to bowel motility Outcome: Progressing Goal: Will not experience complications related to urinary retention Outcome: Progressing   Problem: Pain Managment: Goal: General experience of comfort will improve Outcome: Progressing   Problem: Safety: Goal: Ability to remain free from injury will improve Outcome: Progressing   Problem: Skin Integrity: Goal: Risk for impaired skin integrity will decrease Outcome: Progressing   Problem: Education: Goal: Understanding of CV disease, CV risk reduction, and recovery process will improve Outcome: Progressing Goal: Individualized Educational Video(s) Outcome: Progressing   Problem: Activity: Goal: Ability to return to baseline activity level will improve Outcome: Progressing
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
## 2022-12-31 ENCOUNTER — Other Ambulatory Visit (HOSPITAL_COMMUNITY): Payer: 59

## 2022-12-31 ENCOUNTER — Encounter (HOSPITAL_COMMUNITY): Payer: Self-pay | Admitting: Cardiology

## 2022-12-31 DIAGNOSIS — I2510 Atherosclerotic heart disease of native coronary artery without angina pectoris: Secondary | ICD-10-CM | POA: Diagnosis not present

## 2022-12-31 DIAGNOSIS — I214 Non-ST elevation (NSTEMI) myocardial infarction: Secondary | ICD-10-CM | POA: Diagnosis not present

## 2022-12-31 DIAGNOSIS — J449 Chronic obstructive pulmonary disease, unspecified: Secondary | ICD-10-CM | POA: Diagnosis not present

## 2022-12-31 LAB — CBC
HCT: 44.7 % (ref 39.0–52.0)
Hemoglobin: 15.3 g/dL (ref 13.0–17.0)
MCH: 30.6 pg (ref 26.0–34.0)
MCHC: 34.2 g/dL (ref 30.0–36.0)
MCV: 89.4 fL (ref 80.0–100.0)
Platelets: 137 10*3/uL — ABNORMAL LOW (ref 150–400)
RBC: 5 MIL/uL (ref 4.22–5.81)
RDW: 12.6 % (ref 11.5–15.5)
WBC: 5.7 10*3/uL (ref 4.0–10.5)
nRBC: 0 % (ref 0.0–0.2)

## 2022-12-31 LAB — HEPARIN LEVEL (UNFRACTIONATED): Heparin Unfractionated: 0.35 [IU]/mL (ref 0.30–0.70)
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
## 2022-12-31 NOTE — Plan of Care (Signed)

## 2022-12-31 NOTE — Progress Notes (Signed)
Rounding Note    Patient Name: [name] Date of Encounter: 12/31/2022  [facility] Cardiologist: Norman Herrlich, MD   Subjective   Doing well today.  No chest pain or shortness of breath overnight.  Sitting in a chair at the bedside on IV heparin.  Inpatient Medications    Scheduled Meds:  aspirin EC  81 mg Oral Daily   atorvastatin  80 mg Oral Daily   isosorbide mononitrate  30 mg Oral Daily   metoprolol tartrate  25 mg Oral Daily   mometasone-formoterol  2 puff Inhalation BID   nicotine  21 mg Transdermal Daily   polyethylene glycol  17 g Oral BID   sodium chloride flush  3 mL Intravenous Q12H   Continuous Infusions:  sodium chloride     heparin 2,100 Units/hr (12/30/22 2146)   PRN Meds: sodium chloride, acetaminophen, naphazoline-pheniramine, nitroGLYCERIN, ondansetron (ZOFRAN) IV, sodium chloride flush   Vital Signs    Vitals:   12/30/22 1917 12/30/22 2332 12/31/22 0355 12/31/22 0832  BP: (!) 144/88 (!) 146/81 (!) 146/75 135/74  Pulse: 79 61 77 66  Resp: 20 19 16  (!) 21  Temp: 98.7 F (37.1 C) 98 F (36.7 C) 97.6 F (36.4 C) 97.6 F (36.4 C)  TempSrc: Oral Oral Axillary Axillary  SpO2: 95% 93% 92% 92%  Weight:      Height:        Intake/Output Summary (Last 24 hours) at 12/31/2022 0854 Last data filed at 12/31/2022 0842 Gross per 24 hour  Intake 3364.95 ml  Output --  Net 3364.95 ml      12/28/2022    4:00 PM 12/28/2022    6:02 AM 10/31/2022    8:01 AM  Last 3 Weights  Weight (lbs) 271 lb 9.7 oz 280 lb 283 lb  Weight (kg) 123.2 kg 127.007 kg 128.368 kg      Telemetry    Sinus rhythm with occasional PVCs, no sustained arrhythmia - Personally Reviewed   Physical Exam  Alert, oriented, no distress GEN: No acute distress.   Neck: No JVD Cardiac: RRR, no murmurs, rubs, or gallops.  Respiratory: Clear to auscultation bilaterally. GI: Soft, nontender, non-distended  MS: No edema; No deformity.  Right radial cath site  clear. Neuro:  Nonfocal  Psych: Normal affect   Labs    High Sensitivity Troponin:   Recent Labs  Lab 12/28/22 0612 12/28/22 0747 12/28/22 1417  TROPONINIHS 16 76* 1,078*     Chemistry Recent Labs  Lab 12/28/22 0612 12/29/22 0336  NA 140 140  K 4.1 3.9  CL 105 104  CO2 25 24  GLUCOSE 186* 159*  BUN 15 15  CREATININE 0.77 0.79  CALCIUM 8.9 8.8*  GFRNONAA >60 >60  ANIONGAP 10 12    Lipids  Recent Labs  Lab 12/29/22 0336  CHOL 153  TRIG 180*  HDL 33*  LDLCALC 84  CHOLHDL 4.6    Hematology Recent Labs  Lab 12/29/22 0336 12/30/22 0125 12/31/22 0209  WBC 6.3 5.5 5.7  RBC 5.18 4.92 5.00  HGB 16.2 15.7 15.3  HCT 47.7 45.1 44.7  MCV 92.1 91.7 89.4  MCH 31.3 31.9 30.6  MCHC 34.0 34.8 34.2  RDW 12.9 12.9 12.6  PLT 135* 125* 137*   Thyroid No results for input(s): "TSH", "FREET4" in the last 168 hours.  BNP Recent Labs  Lab 12/28/22 0612  BNP 32.3    DDimer No results for input(s): "DDIMER" in the last 168 hours.  Radiology    CARDIAC CATHETERIZATION  Result Date: 12/30/2022   Prox LAD lesion is 70% stenosed.  Prox LAD to Mid LAD lesion is 95% stenosed.   Mid LAD-1 lesion is 30% stenosed with 75% stenosed side branch in 2nd Diag.   Mid LAD-2 lesion is 75% stenosed.   Mid Cx lesion is 60% stenosed.   Prox RCA to Mid RCA lesion is 95% stenosed.  Mid RCA lesion is 50% stenosed.   The left ventricular systolic function is normal.  The left ventricular ejection fraction is 50-55% by visual estimate.   LV end diastolic pressure is normal.   There is no aortic valve stenosis. POST-CATH FINDINGS Severe MV CAD: CULPRIT: SEVERE ULCERATED ECCENTRIC 95% proximal LAD at small diagonal branch (prior to major 2nd Diag), 30% stenosis at 2nd Diag with 75% ostial diag sidebranch; mid LAD concentric focal 75%; Proximal-mid RCA 95% ulcerated plaque tapering up to 50% (secondary culprit) Proximal LCx 60 to 65% prior to trifurcation into OM1, OM 2 and OM 3. Preserved LVEF with  normal EDP (EF estimated 50 to 55%-cannot fully exclude mild anterior wall motion normality but appears to be relatively normal) recommend 2D Echo. RECOMMENDATIONS Consider CVTS consultation for for revascularization as a potential preferred option over multivessel PCI with least 2 lesions in the LAD and 1 long RCA lesion.  If PCI is chosen, would probably only treat the RCA and LAD however CABG would also allow revascularization of diagonal branch and OM's. Restart IV heparin 2 hours after TR band removal Check 2D Echo Bryan Lemma, MD  ECHOCARDIOGRAM COMPLETE  Result Date: 12/29/2022    ECHOCARDIOGRAM REPORT   Patient Name:   [name] Date of Exam: 12/29/2022 Medical Rec #:  [id number]                 Height:       72.0 in Accession #:    [id number]                Weight:       271.6 lb Date of Birth:  [birth year]                BSA:          2.427 m Patient Age:    61 years                  BP:           149/83 mmHg Patient Gender: M                         HR:           65 bpm. Exam Location:  Inpatient Procedure: 2D Echo, Cardiac Doppler, Color Doppler and Intracardiac            Opacification Agent Indications:    NSTEMI I21.4  History:        Patient has prior history of Echocardiogram examinations and                 Patient has no prior history of Echocardiogram examinations.                 CAD; Risk Factors:Dyslipidemia and Diabetes.  Sonographer:    Harriette Bouillon RDCS Referring Phys: [id number] Cecille Po MELVIN IMPRESSIONS  1. Left ventricular ejection fraction, by estimation, is 55 to 60%. The left ventricle has normal function. The left ventricle has no regional wall motion abnormalities. The  left ventricular internal cavity size was mildly to moderately dilated. Left ventricular diastolic parameters are consistent with Grade I diastolic dysfunction (impaired relaxation).  2. Right ventricular systolic function is normal. The right ventricular size is normal. Tricuspid regurgitation  signal is inadequate for assessing PA pressure.  3. The mitral valve is grossly normal. Trivial mitral valve regurgitation. No evidence of mitral stenosis.  4. The aortic valve is tricuspid. Aortic valve regurgitation is not visualized. No aortic stenosis is present. FINDINGS  Left Ventricle: Left ventricular ejection fraction, by estimation, is 55 to 60%. The left ventricle has normal function. The left ventricle has no regional wall motion abnormalities. Definity contrast agent was given IV to delineate the left ventricular  endocardial borders. The left ventricular internal cavity size was mildly to moderately dilated. There is no left ventricular hypertrophy. Left ventricular diastolic parameters are consistent with Grade I diastolic dysfunction (impaired relaxation). Right Ventricle: The right ventricular size is normal. No increase in right ventricular wall thickness. Right ventricular systolic function is normal. Tricuspid regurgitation signal is inadequate for assessing PA pressure. Left Atrium: Left atrial size was normal in size. Right Atrium: Right atrial size was normal in size. Pericardium: There is no evidence of pericardial effusion. Presence of epicardial fat layer. Mitral Valve: The mitral valve is grossly normal. Trivial mitral valve regurgitation. No evidence of mitral valve stenosis. Tricuspid Valve: The tricuspid valve is grossly normal. Tricuspid valve regurgitation is trivial. No evidence of tricuspid stenosis. Aortic Valve: The aortic valve is tricuspid. Aortic valve regurgitation is not visualized. No aortic stenosis is present. Pulmonic Valve: The pulmonic valve was grossly normal. Pulmonic valve regurgitation is not visualized. No evidence of pulmonic stenosis. Aorta: The aortic root and ascending aorta are structurally normal, with no evidence of dilitation. Venous: The inferior vena cava was not well visualized. IAS/Shunts: The atrial septum is grossly normal.  LEFT VENTRICLE PLAX 2D  LVIDd:         6.30 cm   Diastology LVIDs:         5.00 cm   LV e' medial:   5.22 cm/s LV PW:         1.00 cm   LV E/e' medial: 9.5 LV IVS:        1.00 cm LVOT diam:     2.60 cm LV SV:         90 LV SV Index:   37 LVOT Area:     5.31 cm  RIGHT VENTRICLE TAPSE (M-mode): 1.8 cm LEFT ATRIUM             Index        RIGHT ATRIUM           Index LA diam:        4.80 cm 1.98 cm/m   RA Area:     12.40 cm LA Vol (A2C):   49.1 ml 20.23 ml/m  RA Volume:   24.10 ml  9.93 ml/m LA Vol (A4C):   78.7 ml 32.43 ml/m LA Biplane Vol: 63.3 ml 26.09 ml/m  AORTIC VALVE LVOT Vmax:   86.50 cm/s LVOT Vmean:  63.600 cm/s LVOT VTI:    0.170 m  AORTA Ao Root diam: 3.60 cm Ao Asc diam:  3.40 cm MITRAL VALVE MV Area (PHT): 2.43 cm    SHUNTS MV Decel Time: 312 msec    Systemic VTI:  0.17 m MV E velocity: 49.40 cm/s  Systemic Diam: 2.60 cm MV A velocity: 74.50 cm/s MV E/A ratio:  0.66  Lennie Odor MD Electronically signed by Lennie Odor MD Signature Date/Time: 12/29/2022/10:55:44 AM    Final     Cardiac Studies   Cardiac cath films personally reviewed  Patient Profile     62 y.o. male medical history significant of hypertension, hyperlipidemia, GERD, diabetes, COPD, childhood epilepsy, chronic hepatitis C, obesity, OSA, CAD status post MI, cocaine use, anxiety, low back pain presenting with chest pain and rules in for NSTEMI   Assessment & Plan    1.  Non-STEMI: Patient with severe multivessel CAD, primarily affecting the LAD and RCA with complex ulcerated plaques in each of those vessels.  Cardiac surgical evaluation pending.  Continue heparin, aspirin, beta-blocker, and high intensity statin drug.  If the patient refuses surgery (he seems amenable) or is felt to be a poor candidate, two-vessel PCI would be an option for him. 2.  Hypertension: Currently on isosorbide and metoprolol.  Continue the same. 3.  Hyperlipidemia: LDL 84.  High intensity statin drug started with atorvastatin 80 mg daily 4.  Tobacco abuse:  Cessation counseling done.  Notes reviewed and patient has been going out to smoke during his hospitalization.  However, he now states he is doing well on a nicotine patch and is very motivated to quit smoking.  Dispo: cardiac surgical consult pending  For questions or updates, please contact [facility] Please consult [email or web address] for contact info under        Signed, Tonny Bollman, MD  12/31/2022, 8:54 AM

## 2022-12-31 NOTE — Progress Notes (Signed)
Mobility Specialist Progress Note:   12/31/22 0950  Mobility  Activity Ambulated with assistance in hallway  Level of Assistance Standby assist, set-up cues, supervision of patient - no hands on  Assistive Device  (IV Pole)  Distance Ambulated (ft) 225 ft  Activity Response Tolerated well  Mobility Referral Yes  $Mobility charge 1 Mobility  Mobility Specialist Start Time (ACUTE ONLY) 0935  Mobility Specialist Stop Time (ACUTE ONLY) 0945  Mobility Specialist Time Calculation (min) (ACUTE ONLY) 10 min   During Mobility: 88-105 HR  Post Mobility: 73 HR   Pt received in chair, agreeable to mobility. Pt denied any discomfort during ambulation, asymptomatic throughout. Pt returned to chair with call bell in hand and all needs met.   Leory Plowman  Mobility Specialist Please contact via [employer] office at [phone]

## 2022-12-31 NOTE — Progress Notes (Signed)
PROGRESS NOTE  [name] UJW:[id number] DOB: 1960-05-20 DOA: 12/28/2022 PCP: Tresa Garter, MD   LOS: 2 days   Brief Narrative / Interim history: 62 y.o. male with medical history significant of hypertension, hyperlipidemia, GERD, diabetes, COPD, childhood epilepsy, chronic hepatitis C, obesity, OSA, CAD status post MI, cocaine use, anxiety, low back pain presenting with chest pain.  Was found to have an NSTEMI, cardiology consulted  Subjective / 24h Interval events: Doing well this morning, denies any chest pain.  Tolerating heparin well.  Assesement and Plan: Principal Problem:   NSTEMI (non-ST elevated myocardial infarction) (HCC) Active Problems:   Morbid obesity (HCC)   Anxiety disorder   Epilepsy (HCC)   HTN (hypertension)   MYOCARDIAL INFARCTION, HX OF   GERD   Low back pain radiating to left lower extremity   COPD mixed type (HCC)   OSA (obstructive sleep apnea)   CAD (coronary artery disease)   Cocaine abuse (HCC)   Pure hypercholesterolemia   Principal problem Non-STEMI, underlying CAD -with prior MI.  Continue heparin, cardiac cath done on 8/19 with multivessel disease, recommendation for CVTS consultation vs multivessel PCI -Awaiting cardiothoracic surgery input  Active problems Essential hypertension-continue metoprolol, Imdur  COPD-no wheezing, this is stable  Tobacco use-2 packs a day.  Continue nicotine patch  Obesity, class II-BMI 36  OSA-continue CPAP  Scheduled Meds:  aspirin EC  81 mg Oral Daily   atorvastatin  80 mg Oral Daily   isosorbide mononitrate  30 mg Oral Daily   metoprolol tartrate  25 mg Oral Daily   mometasone-formoterol  2 puff Inhalation BID   nicotine  21 mg Transdermal Daily   polyethylene glycol  17 g Oral BID   sodium chloride flush  3 mL Intravenous Q12H   Continuous Infusions:  sodium chloride     heparin 2,100 Units/hr (12/31/22 1000)   PRN Meds:.sodium chloride, acetaminophen,  naphazoline-pheniramine, nitroGLYCERIN, ondansetron (ZOFRAN) IV, sodium chloride flush  Current Outpatient Medications  Medication Instructions   ALPRAZolam (XANAX) 1 MG tablet TAKE 1 TABLET BY MOUTH 2 TIMES DAILY AS NEEDED.   aspirin EC 81 mg, Oral, Daily, Swallow whole.   Blood Glucose Monitoring Suppl (ONE TOUCH ULTRA 2) w/Device KIT Use to check blood sugars twice a day   budesonide-formoterol (SYMBICORT) 160-4.5 MCG/ACT inhaler INHALE 2 PUFS INTO THE LUNGS TWICE A DAY   diclofenac (VOLTAREN) 75 mg, Oral, 2 times daily PRN, Take 75 mg by mouth 2 (two) times daily as needed.   glucose blood (ONETOUCH ULTRA) test strip Use to check blood sugars twice a day   isosorbide mononitrate (IMDUR) 30 mg, Oral, Daily   metFORMIN (GLUCOPHAGE) 500 mg, Oral, Daily with breakfast   metoprolol tartrate (LOPRESSOR) 25 mg, Oral, Daily, Take 25 mg by mouth daily.   nitroGLYCERIN (NITROSTAT) 0.4 mg, Sublingual, Every 5 min PRN   OneTouch UltraSoft 2 Lancets MISC 1 Lancet, Does not apply, 2 times daily    Diet Orders (From admission, onward)     Start     Ordered   12/30/22 1459  Diet Heart Room service appropriate? Yes; Fluid consistency: Thin  Diet effective now       Question Answer Comment  Room service appropriate? Yes   Fluid consistency: Thin      12/30/22 1458            DVT prophylaxis:    Lab Results  Component Value Date   PLT 137 (L) 12/31/2022      Code Status: Full  Code  Family Communication: no family at bedside   Status is: Inpatient   Level of care: Telemetry Cardiac  Consultants:  Cardiology   Objective: Vitals:   12/30/22 1917 12/30/22 2332 12/31/22 0355 12/31/22 0832  BP: (!) 144/88 (!) 146/81 (!) 146/75 135/74  Pulse: 79 61 77 66  Resp: 20 19 16  (!) 21  Temp: 98.7 F (37.1 C) 98 F (36.7 C) 97.6 F (36.4 C) 97.6 F (36.4 C)  TempSrc: Oral Oral Axillary Axillary  SpO2: 95% 93% 92% 92%  Weight:      Height:        Intake/Output Summary (Last 24  hours) at 12/31/2022 1026 Last data filed at 12/31/2022 0865 Gross per 24 hour  Intake 2775.35 ml  Output --  Net 2775.35 ml   Wt Readings from Last 3 Encounters:  12/28/22 123.2 kg  10/31/22 128.4 kg  07/31/22 131.5 kg    Examination:  Constitutional: NAD Eyes: lids and conjunctivae normal, no scleral icterus ENMT: mmm Respiratory: clear to auscultation bilaterally, no wheezing, no crackles.  Cardiovascular: Regular rate and rhythm, no murmurs / rubs / gallops. No LE edema. Abdomen: soft, no distention, no tenderness. Bowel sounds positive.  Skin: no rashes, multiple tattoos   Data Reviewed: I have independently reviewed following labs and imaging studies   CBC Recent Labs  Lab 12/28/22 0612 12/29/22 0336 12/30/22 0125 12/31/22 0209  WBC 9.9 6.3 5.5 5.7  HGB 17.7* 16.2 15.7 15.3  HCT 50.4 47.7 45.1 44.7  PLT 142* 135* 125* 137*  MCV 89.2 92.1 91.7 89.4  MCH 31.3 31.3 31.9 30.6  MCHC 35.1 34.0 34.8 34.2  RDW 12.8 12.9 12.9 12.6    Recent Labs  Lab 12/28/22 0612 12/29/22 0336  NA 140 140  K 4.1 3.9  CL 105 104  CO2 25 24  GLUCOSE 186* 159*  BUN 15 15  CREATININE 0.77 0.79  CALCIUM 8.9 8.8*  HGBA1C  --  6.6*  BNP 32.3  --     ------------------------------------------------------------------------------------------------------------------ Recent Labs    12/29/22 0336  CHOL 153  HDL 33*  LDLCALC 84  TRIG 784*  CHOLHDL 4.6    Lab Results  Component Value Date   HGBA1C 6.6 (H) 12/29/2022   ------------------------------------------------------------------------------------------------------------------ No results for input(s): "TSH", "T4TOTAL", "T3FREE", "THYROIDAB" in the last 72 hours.  Invalid input(s): "FREET3"  Cardiac Enzymes No results for input(s): "CKMB", "TROPONINI", "MYOGLOBIN" in the last 168 hours.  Invalid input(s):  "CK" ------------------------------------------------------------------------------------------------------------------    Component Value Date/Time   BNP 32.3 12/28/2022 0612    CBG: No results for input(s): "GLUCAP" in the last 168 hours.  No results found for this or any previous visit (from the past 240 hour(s)).   Radiology Studies: CARDIAC CATHETERIZATION  Result Date: 12/30/2022   Prox LAD lesion is 70% stenosed.  Prox LAD to Mid LAD lesion is 95% stenosed.   Mid LAD-1 lesion is 30% stenosed with 75% stenosed side branch in 2nd Diag.   Mid LAD-2 lesion is 75% stenosed.   Mid Cx lesion is 60% stenosed.   Prox RCA to Mid RCA lesion is 95% stenosed.  Mid RCA lesion is 50% stenosed.   The left ventricular systolic function is normal.  The left ventricular ejection fraction is 50-55% by visual estimate.   LV end diastolic pressure is normal.   There is no aortic valve stenosis. POST-CATH FINDINGS Severe MV CAD: CULPRIT: SEVERE ULCERATED ECCENTRIC 95% proximal LAD at small diagonal branch (prior to major 2nd Diag),  30% stenosis at 2nd Diag with 75% ostial diag sidebranch; mid LAD concentric focal 75%; Proximal-mid RCA 95% ulcerated plaque tapering up to 50% (secondary culprit) Proximal LCx 60 to 65% prior to trifurcation into OM1, OM 2 and OM 3. Preserved LVEF with normal EDP (EF estimated 50 to 55%-cannot fully exclude mild anterior wall motion normality but appears to be relatively normal) recommend 2D Echo. RECOMMENDATIONS Consider CVTS consultation for for revascularization as a potential preferred option over multivessel PCI with least 2 lesions in the LAD and 1 long RCA lesion.  If PCI is chosen, would probably only treat the RCA and LAD however CABG would also allow revascularization of diagonal branch and OM's. Restart IV heparin 2 hours after TR band removal Check 2D Echo Bryan Lemma, MD    Pamella Pert, MD, PhD [facility]  Between 7 am - 7 pm I am available, please contact  me via Amion (for emergencies) or Securechat (non urgent messages)  Between 7 pm - 7 am I am not available, please contact night coverage MD/APP via Amion
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
## 2022-12-31 NOTE — TOC Initial Note (Signed)
Transition of Care [facility]) - Initial/Assessment Note    Patient Details  Name: [name] MRN: [id number] Date of Birth: May 19, 1960  Transition of Care [facility]) CM/SW Contact:    Lawerance Sabal, RN Phone Number: 12/31/2022, 2:35 PM  Clinical Narrative:                  Met with this very pleasant patient and his wife at bedside.  He confirms insurance coverage, PCP on file.  Admitted with NSTEMI with eval pending for possible CABG this week.  Discussed ETOH and smoking. Patient states he is casual drinker when he goes to eat, and states he has smoked his last cigarette.  He declines resources at this time.  Independent patient, anticipate DC to home with support of spouse.   Expected Discharge Plan: Home/Self Care     Patient Goals and CMS Choice Patient states their goals for this hospitalization and ongoing recovery are:: to go home          Expected Discharge Plan and Services   Discharge Planning Services: CM Consult   Living arrangements for the past 2 months: Single Family Home                 DME Arranged: N/A           HH Agency: NA        Prior Living Arrangements/Services Living arrangements for the past 2 months: Single Family Home Lives with:: Spouse   Do you feel safe going back to the place where you live?: Yes               Activities of Daily Living      Permission Sought/Granted                  Emotional Assessment              Admission diagnosis:  NSTEMI (non-ST elevated myocardial infarction) [facility]) [I21.4] Patient Active Problem List   Diagnosis Date Noted   NSTEMI (non-ST elevated myocardial infarction) (HCC) 12/28/2022   Cervical radiculopathy 05/01/2022   Type 2 diabetes mellitus with obesity (HCC) 04/25/2021   Atherosclerosis of aorta (HCC) 03/02/2021   CAD (coronary artery disease) 02/19/2021   Cocaine abuse (HCC) 02/19/2021   Pulmonary nodule 06/06/2020   Neoplasm of uncertain behavior of skin  07/29/2019   Medically noncompliant 12/08/2018   Pure hypercholesterolemia 02/18/2017   Overactive bladder 02/02/2015   Microhematuria 02/02/2015   COPD mixed type (HCC) 07/17/2014   Pneumonitis 07/17/2014   OSA (obstructive sleep apnea) 07/17/2014   Tobacco dependence 07/17/2014   Low back pain radiating to left lower extremity 11/25/2013   Eczema 07/24/2011   HTN (hypertension) 01/11/2010   Morbid obesity (HCC) 09/03/2007   PARASOMNIA 09/03/2007   Epilepsy (HCC) 03/10/2007   Anxiety disorder 12/20/2006   MYOCARDIAL INFARCTION, HX OF 12/20/2006   GERD 12/20/2006   PCP:  Tresa Garter, MD Pharmacy:   [id number] - [place], [place] - [address] [place] [address] Phone: [phone] Fax: [phone]     Social Determinants of Health (SDOH) Social History: SDOH Screenings   Depression (PHQ2-9): Low Risk  (07/31/2022)  Tobacco Use: High Risk (12/28/2022)   SDOH Interventions:     Readmission Risk Interventions     No data to display
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
## 2023-01-01 ENCOUNTER — Inpatient Hospital Stay (HOSPITAL_COMMUNITY): Payer: 59

## 2023-01-01 DIAGNOSIS — Z01810 Encounter for preprocedural cardiovascular examination: Secondary | ICD-10-CM | POA: Diagnosis not present

## 2023-01-01 DIAGNOSIS — I214 Non-ST elevation (NSTEMI) myocardial infarction: Secondary | ICD-10-CM | POA: Diagnosis not present

## 2023-01-01 LAB — PULMONARY FUNCTION TEST
DL/VA % pred: 112 %
DL/VA: 4.71 ml/min/mmHg/L
DLCO cor % pred: 74 %
DLCO cor: 21.87 ml/min/mmHg
DLCO unc % pred: 76 %
DLCO unc: 22.52 ml/min/mmHg
FEF 25-75 Post: 2.31 L/s
FEF 25-75 Pre: 2.56 L/s
FEF2575-%Change-Post: -9 %
FEF2575-%Pred-Post: 73 %
FEF2575-%Pred-Pre: 80 %
FEV1-%Change-Post: 0 %
FEV1-%Pred-Post: 58 %
FEV1-%Pred-Pre: 58 %
FEV1-Post: 2.26 L
FEV1-Pre: 2.27 L
FEV1FVC-%Change-Post: -3 %
FEV1FVC-%Pred-Pre: 110 %
FEV6-%Change-Post: 2 %
FEV6-%Pred-Post: 56 %
FEV6-%Pred-Pre: 55 %
FEV6-Post: 2.78 L
FEV6-Pre: 2.72 L
FEV6FVC-%Pred-Post: 104 %
FEV6FVC-%Pred-Pre: 104 %
FVC-%Change-Post: 2 %
FVC-%Pred-Post: 54 %
FVC-%Pred-Pre: 53 %
FVC-Post: 2.78 L
FVC-Pre: 2.72 L
Post FEV1/FVC ratio: 81 %
Post FEV6/FVC ratio: 100 %
Pre FEV1/FVC ratio: 84 %
Pre FEV6/FVC Ratio: 100 %
RV % pred: 104 %
RV: 2.45 L
TLC % pred: 70 %
TLC: 5.25 L

## 2023-01-01 LAB — CBC
HCT: 45.9 % (ref 39.0–52.0)
Hemoglobin: 15.7 g/dL (ref 13.0–17.0)
MCH: 30.7 pg (ref 26.0–34.0)
MCHC: 34.2 g/dL (ref 30.0–36.0)
MCV: 89.8 fL (ref 80.0–100.0)
Platelets: 138 10*3/uL — ABNORMAL LOW (ref 150–400)
RBC: 5.11 MIL/uL (ref 4.22–5.81)
RDW: 12.6 % (ref 11.5–15.5)
WBC: 5.5 10*3/uL (ref 4.0–10.5)
nRBC: 0 % (ref 0.0–0.2)

## 2023-01-01 LAB — VAS US DOPPLER PRE CABG

## 2023-01-01 LAB — LIPOPROTEIN A (LPA): Lipoprotein (a): <8.4 nmol/L (ref <75.0)

## 2023-01-01 LAB — HEPARIN LEVEL (UNFRACTIONATED): Heparin Unfractionated: 0.46 [IU]/mL (ref 0.30–0.70)

## 2023-01-01 LAB — ABO/RH: ABO/RH(D): A NEG

## 2023-01-01 MED ORDER — CHLORHEXIDINE GLUCONATE CLOTH 2 % EX PADS: 6.0000 | MEDICATED_PAD | Freq: Once | CUTANEOUS | Status: AC

## 2023-01-01 MED ORDER — TRANEXAMIC ACID (OHS) BOLUS VIA INFUSION
15.0000 mg/kg | INTRAVENOUS | Status: AC
  Administered 2023-01-02: 1848 mg via INTRAVENOUS
  Filled 2023-01-01: qty 1848

## 2023-01-01 MED ORDER — DEXMEDETOMIDINE HCL IN NACL 400 MCG/100ML IV SOLN
0.1000 ug/kg/h | INTRAVENOUS | Status: AC
  Administered 2023-01-02: .4 ug/kg/h via INTRAVENOUS
  Filled 2023-01-01: qty 100

## 2023-01-01 MED ORDER — POTASSIUM CHLORIDE 2 MEQ/ML IV SOLN
80.0000 meq | INTRAVENOUS | Status: DC
  Filled 2023-01-01: qty 40

## 2023-01-01 MED ORDER — VANCOMYCIN HCL 1500 MG/300ML IV SOLN
1500.0000 mg | INTRAVENOUS | Status: AC
  Administered 2023-01-02: 1500 mg via INTRAVENOUS
  Filled 2023-01-01: qty 300

## 2023-01-01 MED ORDER — MILRINONE LACTATE IN DEXTROSE 20-5 MG/100ML-% IV SOLN
0.3000 ug/kg/min | INTRAVENOUS | Status: DC
  Filled 2023-01-01: qty 100

## 2023-01-01 MED ORDER — TRANEXAMIC ACID 1000 MG/10ML IV SOLN
1.5000 mg/kg/h | INTRAVENOUS | Status: AC
  Administered 2023-01-02: 1.5 mg/kg/h via INTRAVENOUS
  Filled 2023-01-01: qty 25

## 2023-01-01 MED ORDER — HEPARIN 30,000 UNITS/1000 ML (OHS) CELLSAVER SOLUTION
Status: DC
  Filled 2023-01-01: qty 1000

## 2023-01-01 MED ORDER — CEFAZOLIN SODIUM-DEXTROSE 2-4 GM/100ML-% IV SOLN
2.0000 g | INTRAVENOUS | Status: DC
  Filled 2023-01-01: qty 100

## 2023-01-01 MED ORDER — EPINEPHRINE HCL 5 MG/250ML IV SOLN IN NS
0.0000 ug/min | INTRAVENOUS | Status: DC
  Filled 2023-01-01: qty 250

## 2023-01-01 MED ORDER — PHENYLEPHRINE HCL-NACL 20-0.9 MG/250ML-% IV SOLN
30.0000 ug/min | INTRAVENOUS | Status: AC
  Administered 2023-01-02: 30 ug/min via INTRAVENOUS
  Filled 2023-01-01: qty 250

## 2023-01-01 MED ORDER — ALBUTEROL SULFATE (2.5 MG/3ML) 0.083% IN NEBU
2.5000 mg | INHALATION_SOLUTION | Freq: Once | RESPIRATORY_TRACT | Status: AC
  Administered 2023-01-01: 2.5 mg via RESPIRATORY_TRACT

## 2023-01-01 MED ORDER — PLASMA-LYTE A IV SOLN
INTRAVENOUS | Status: DC
  Filled 2023-01-01: qty 2.5

## 2023-01-01 MED ORDER — TRANEXAMIC ACID (OHS) PUMP PRIME SOLUTION
2.0000 mg/kg | INTRAVENOUS | Status: DC
  Filled 2023-01-01: qty 2.46

## 2023-01-01 MED ORDER — MAGNESIUM SULFATE 50 % IJ SOLN
40.0000 meq | INTRAMUSCULAR | Status: DC
  Filled 2023-01-01: qty 9.85

## 2023-01-01 MED ORDER — INSULIN REGULAR(HUMAN) IN NACL 100-0.9 UT/100ML-% IV SOLN
INTRAVENOUS | Status: AC
  Administered 2023-01-02: 3.6 [IU]/h via INTRAVENOUS
  Filled 2023-01-01: qty 100

## 2023-01-01 MED ORDER — NITROGLYCERIN IN D5W 200-5 MCG/ML-% IV SOLN
2.0000 ug/min | INTRAVENOUS | Status: AC
  Administered 2023-01-02: 5 ug/min via INTRAVENOUS
  Filled 2023-01-01: qty 250

## 2023-01-01 MED ORDER — CEFAZOLIN IN SODIUM CHLORIDE 3-0.9 GM/100ML-% IV SOLN
3.0000 g | INTRAVENOUS | Status: AC
  Administered 2023-01-02: 3 g via INTRAVENOUS
  Administered 2023-01-02: 2 g via INTRAVENOUS
  Filled 2023-01-01: qty 100

## 2023-01-01 MED ORDER — METOPROLOL TARTRATE 12.5 MG HALF TABLET
12.5000 mg | ORAL_TABLET | Freq: Once | ORAL | Status: AC
  Administered 2023-01-02: 12.5 mg via ORAL
  Filled 2023-01-01: qty 1

## 2023-01-01 MED ORDER — BISACODYL 5 MG PO TBEC
5.0000 mg | DELAYED_RELEASE_TABLET | Freq: Once | ORAL | Status: AC
  Administered 2023-01-01: 5 mg via ORAL
  Filled 2023-01-01: qty 1

## 2023-01-01 MED ORDER — DIAZEPAM 2 MG PO TABS
5.0000 mg | ORAL_TABLET | Freq: Once | ORAL | Status: AC
  Administered 2023-01-02: 5 mg via ORAL
  Filled 2023-01-01: qty 3

## 2023-01-01 MED ORDER — NOREPINEPHRINE 4 MG/250ML-% IV SOLN
0.0000 ug/min | INTRAVENOUS | Status: DC
  Filled 2023-01-01: qty 250

## 2023-01-01 MED ORDER — CHLORHEXIDINE GLUCONATE CLOTH 2 % EX PADS
6.0000 | MEDICATED_PAD | Freq: Once | CUTANEOUS | Status: AC
  Administered 2023-01-01: 6 via TOPICAL

## 2023-01-01 MED ORDER — METOPROLOL TARTRATE 25 MG PO TABS
37.5000 mg | ORAL_TABLET | Freq: Every day | ORAL | Status: DC
  Administered 2023-01-01: 37.5 mg via ORAL
  Filled 2023-01-01: qty 1

## 2023-01-01 MED ORDER — CHLORHEXIDINE GLUCONATE 0.12 % MT SOLN
15.0000 mL | Freq: Once | OROMUCOSAL | Status: AC
  Administered 2023-01-02: 15 mL via OROMUCOSAL
  Filled 2023-01-01: qty 15

## 2023-01-01 NOTE — Progress Notes (Signed)
Pre-CABG vascular studies completed.   Please see CV Procedures for preliminary results.  Rishikesh Khachatryan, RVT  2:01 PM 01/01/23

## 2023-01-01 NOTE — Progress Notes (Signed)
Discussed with pt and his best friend IS (2000 ml), sternal precautions, mobility post op, and d/c planning. Pt receptive, appropriate questions. His wife will be with him at d/c. Gave pt materials to review and encouraged IS. He sts he has no mobility limitations. [phone] Ethelda Chick BS, ACSM-CEP 01/01/2023 3:15 PM

## 2023-01-01 NOTE — Progress Notes (Signed)
PROGRESS NOTE  Rilee Feland  DGL:[id number] DOB: 1961/05/01 DOA: 12/28/2022 PCP: Tresa Garter, MD   Brief Narrative: Patient is a 62 year old male with history of hypertension, hyperlipidemia, GERD, diabetes type 2, COPD, epilepsy, chronic hep C, obesity, OSA, CAD/MI, cocaine use, anxiety, low back pain who presented with chest pain.  Admitted for the management of NSTEMI, cardiology following.  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  Cardiothoracic surgery following for the consideration of CABG.  Assessment & Plan:  Principal Problem:   NSTEMI (non-ST elevated myocardial infarction) [facility]) Active Problems:   Morbid obesity (HCC)   Anxiety disorder   Epilepsy (HCC)   HTN (hypertension)   MYOCARDIAL INFARCTION, HX OF   GERD   Low back pain radiating to left lower extremity   COPD mixed type (HCC)   OSA (obstructive sleep apnea)   CAD (coronary artery disease)   Cocaine abuse (HCC)   Pure hypercholesterolemia   NSTEMI: Presented with chest pain.  Underwent left heart cath on 8/19 which showed severe multivessel disease.  Currently on heparin drip.  Cardiothoracic surgery following for  consideration of CABG but found to have high risks due to multiple comorbidities.  Continue heparin drip, Lipitor, Lopressor. If patient does not undergo CABG, cardiology planning for PCI of LAD and RCA.  History of hyperlipidemia: On Lipitor  History of hypertension: On Imdur, Lopressor.  History of tobacco use: Smoked 2 packs a day.  Continue nicotine patch.  Counseled cessation.  Diabetes type 2: A1c of 6.6.  Currently not on any medication at home.  COPD: Currently stable.  Not on exacerbation  Childhood epilepsy: Continue seizure medications  Obesity: BMI of 36.8.  OSA: Continue CPAP therapy        DVT prophylaxis:Heparin drip     Code Status: Full Code  Family Communication: None at bedside  Patient status:Inpatient  Patient is from  :Home  Anticipated discharge JJ:OACZ  Estimated DC date:after cardiology clearance   Consultants: Cardiology, cardiothoracic surgery  Procedures: Cardiac cath  Antimicrobials:  Anti-infectives (From admission, onward)    None       Subjective: Patient seen and examined at bedside today.  Hemodynamically stable.  Comfortably sitting on a chair.  Any chest pain at the moment.  On room air.   Objective: Vitals:   12/31/22 1910 12/31/22 2221 01/01/23 0403 01/01/23 0716  BP: 136/82 (!) 147/84 124/68 122/78  Pulse: 86 67 67   Resp: 20 16 20 17   Temp: 97.9 F (36.6 C) 97.7 F (36.5 C) 98 F (36.7 C) 98 F (36.7 C)  TempSrc: Oral Axillary Oral Oral  SpO2: 93% 92% 94% 96%  Weight:      Height:        Intake/Output Summary (Last 24 hours) at 01/01/2023 0927 Last data filed at 01/01/2023 6606 Gross per 24 hour  Intake 488.14 ml  Output --  Net 488.14 ml   Filed Weights   12/28/22 0602 12/28/22 1600  Weight: 127 kg 123.2 kg    Examination:  General exam: Overall comfortable, not in distress, obese HEENT: PERRL Respiratory system:  no wheezes or crackles  Cardiovascular system: S1 & S2 heard, RRR.  Gastrointestinal system: Abdomen is nondistended, soft and nontender. Central nervous system: Alert and oriented Extremities: No edema, no clubbing ,no cyanosis Skin: Body covered with tattoos   Data Reviewed: I have personally reviewed following labs and imaging studies  CBC: Recent Labs  Lab 12/28/22 0612 12/29/22 0336 12/30/22 0125 12/31/22 0209 01/01/23 0351  WBC  9.9 6.3 5.5 5.7 5.5  HGB 17.7* 16.2 15.7 15.3 15.7  HCT 50.4 47.7 45.1 44.7 45.9  MCV 89.2 92.1 91.7 89.4 89.8  PLT 142* 135* 125* 137* 138*   Basic Metabolic Panel: Recent Labs  Lab 12/28/22 0612 12/29/22 0336  NA 140 140  K 4.1 3.9  CL 105 104  CO2 25 24  GLUCOSE 186* 159*  BUN 15 15  CREATININE 0.77 0.79  CALCIUM 8.9 8.8*     No results found for this or any previous visit (from  the past 240 hour(s)).   Radiology Studies: CARDIAC CATHETERIZATION  Result Date: 12/30/2022   Prox LAD lesion is 70% stenosed.  Prox LAD to Mid LAD lesion is 95% stenosed.   Mid LAD-1 lesion is 30% stenosed with 75% stenosed side branch in 2nd Diag.   Mid LAD-2 lesion is 75% stenosed.   Mid Cx lesion is 60% stenosed.   Prox RCA to Mid RCA lesion is 95% stenosed.  Mid RCA lesion is 50% stenosed.   The left ventricular systolic function is normal.  The left ventricular ejection fraction is 50-55% by visual estimate.   LV end diastolic pressure is normal.   There is no aortic valve stenosis. POST-CATH FINDINGS Severe MV CAD: CULPRIT: SEVERE ULCERATED ECCENTRIC 95% proximal LAD at small diagonal branch (prior to major 2nd Diag), 30% stenosis at 2nd Diag with 75% ostial diag sidebranch; mid LAD concentric focal 75%; Proximal-mid RCA 95% ulcerated plaque tapering up to 50% (secondary culprit) Proximal LCx 60 to 65% prior to trifurcation into OM1, OM 2 and OM 3. Preserved LVEF with normal EDP (EF estimated 50 to 55%-cannot fully exclude mild anterior wall motion normality but appears to be relatively normal) recommend 2D Echo. RECOMMENDATIONS Consider CVTS consultation for for revascularization as a potential preferred option over multivessel PCI with least 2 lesions in the LAD and 1 long RCA lesion.  If PCI is chosen, would probably only treat the RCA and LAD however CABG would also allow revascularization of diagonal branch and OM's. Restart IV heparin 2 hours after TR band removal Check 2D Echo Bryan Lemma, MD   Scheduled Meds:  aspirin EC  81 mg Oral Daily   atorvastatin  80 mg Oral Daily   isosorbide mononitrate  30 mg Oral Daily   metoprolol tartrate  37.5 mg Oral Daily   mometasone-formoterol  2 puff Inhalation BID   nicotine  21 mg Transdermal Daily   polyethylene glycol  17 g Oral BID   sodium chloride flush  3 mL Intravenous Q12H   Continuous Infusions:  sodium chloride     heparin 2,100  Units/hr (01/01/23 0824)     LOS: 3 days   Burnadette Pop, MD [facility] P8/21/2024, 9:27 AM

## 2023-01-01 NOTE — Plan of Care (Signed)
  Problem: Education: Goal: Knowledge of General Education information will improve Description: Including pain rating scale, medication(s)/side effects and non-pharmacologic comfort measures Outcome: Progressing   Problem: Health Behavior/Discharge Planning: Goal: Ability to manage health-related needs will improve Outcome: Progressing   Problem: Clinical Measurements: Goal: Ability to maintain clinical measurements within normal limits will improve Outcome: Progressing Goal: Will remain free from infection Outcome: Progressing Goal: Diagnostic test results will improve Outcome: Progressing Goal: Respiratory complications will improve Outcome: Progressing Goal: Cardiovascular complication will be avoided Outcome: Progressing   Problem: Activity: Goal: Risk for activity intolerance will decrease Outcome: Progressing   Problem: Nutrition: Goal: Adequate nutrition will be maintained Outcome: Progressing   Problem: Coping: Goal: Level of anxiety will decrease Outcome: Progressing   Problem: Elimination: Goal: Will not experience complications related to bowel motility Outcome: Progressing Goal: Will not experience complications related to urinary retention Outcome: Progressing   Problem: Pain Managment: Goal: General experience of comfort will improve Outcome: Progressing   Problem: Safety: Goal: Ability to remain free from injury will improve Outcome: Progressing   Problem: Skin Integrity: Goal: Risk for impaired skin integrity will decrease Outcome: Progressing   Problem: Education: Goal: Understanding of CV disease, CV risk reduction, and recovery process will improve Outcome: Progressing Goal: Individualized Educational Video(s) Outcome: Progressing   Problem: Activity: Goal: Ability to return to baseline activity level will improve Outcome: Progressing   Problem: Cardiovascular: Goal: Ability to achieve and maintain adequate cardiovascular perfusion  will improve Outcome: Progressing Goal: Vascular access site(s) Level 0-1 will be maintained Outcome: Progressing   Problem: Health Behavior/Discharge Planning: Goal: Ability to safely manage health-related needs after discharge will improve Outcome: Progressing   Problem: Education: Goal: Understanding of cardiac disease, CV risk reduction, and recovery process will improve Outcome: Progressing Goal: Individualized Educational Video(s) Outcome: Progressing   Problem: Education: Goal: Individualized Educational Video(s) Outcome: Progressing   Problem: Activity: Goal: Ability to tolerate increased activity will improve Outcome: Progressing   Problem: Cardiac: Goal: Ability to achieve and maintain adequate cardiovascular perfusion will improve Outcome: Progressing   Problem: Health Behavior/Discharge Planning: Goal: Ability to safely manage health-related needs after discharge will improve Outcome: Progressing

## 2023-01-01 NOTE — Plan of Care (Signed)
Patient asking appropriate questions and verbalizes understanding.

## 2023-01-01 NOTE — Progress Notes (Signed)
Rounding Note    Patient Name: [name] Date of Encounter: 01/01/2023  [facility] Cardiologist: Norman Herrlich, MD   Subjective   Had an episode of chest pain last night which resolved spontaneously.  No other acute events.   Inpatient Medications    Scheduled Meds:  aspirin EC  81 mg Oral Daily   atorvastatin  80 mg Oral Daily   isosorbide mononitrate  30 mg Oral Daily   metoprolol tartrate  25 mg Oral Daily   mometasone-formoterol  2 puff Inhalation BID   nicotine  21 mg Transdermal Daily   polyethylene glycol  17 g Oral BID   sodium chloride flush  3 mL Intravenous Q12H   Continuous Infusions:  sodium chloride     heparin 2,100 Units/hr (01/01/23 0824)   PRN Meds: sodium chloride, acetaminophen, naphazoline-pheniramine, nitroGLYCERIN, ondansetron (ZOFRAN) IV, sodium chloride flush   Vital Signs    Vitals:   12/31/22 1910 12/31/22 2221 01/01/23 0403 01/01/23 0716  BP: 136/82 (!) 147/84 124/68 122/78  Pulse: 86 67 67   Resp: 20 16 20 17   Temp: 97.9 F (36.6 C) 97.7 F (36.5 C) 98 F (36.7 C) 98 F (36.7 C)  TempSrc: Oral Axillary Oral Oral  SpO2: 93% 92% 94% 96%  Weight:      Height:        Intake/Output Summary (Last 24 hours) at 01/01/2023 0836 Last data filed at 01/01/2023 0824 Gross per 24 hour  Intake 749.19 ml  Output --  Net 749.19 ml      12/28/2022    4:00 PM 12/28/2022    6:02 AM 10/31/2022    8:01 AM  Last 3 Weights  Weight (lbs) 271 lb 9.7 oz 280 lb 283 lb  Weight (kg) 123.2 kg 127.007 kg 128.368 kg      Telemetry    Sinus rhythm - Personally Reviewed   Physical Exam  Alert, oriented, no distress GEN: No acute distress.   Neck: No JVD Cardiac: RRR, no murmurs, rubs, or gallops.  Respiratory: Clear to auscultation bilaterally. GI: Soft, nontender, non-distended  MS: No edema; No deformity.  Right radial cath site clear. Neuro:  Nonfocal  Psych: Normal affect   Labs    High Sensitivity Troponin:    Recent Labs  Lab 12/28/22 0612 12/28/22 0747 12/28/22 1417  TROPONINIHS 16 76* 1,078*     Chemistry Recent Labs  Lab 12/28/22 0612 12/29/22 0336  NA 140 140  K 4.1 3.9  CL 105 104  CO2 25 24  GLUCOSE 186* 159*  BUN 15 15  CREATININE 0.77 0.79  CALCIUM 8.9 8.8*  GFRNONAA >60 >60  ANIONGAP 10 12    Lipids  Recent Labs  Lab 12/29/22 0336  CHOL 153  TRIG 180*  HDL 33*  LDLCALC 84  CHOLHDL 4.6    Hematology Recent Labs  Lab 12/30/22 0125 12/31/22 0209 01/01/23 0351  WBC 5.5 5.7 5.5  RBC 4.92 5.00 5.11  HGB 15.7 15.3 15.7  HCT 45.1 44.7 45.9  MCV 91.7 89.4 89.8  MCH 31.9 30.6 30.7  MCHC 34.8 34.2 34.2  RDW 12.9 12.6 12.6  PLT 125* 137* 138*   Thyroid No results for input(s): "TSH", "FREET4" in the last 168 hours.  BNP Recent Labs  Lab 12/28/22 0612  BNP 32.3    DDimer No results for input(s): "DDIMER" in the last 168 hours.   Radiology    CARDIAC CATHETERIZATION  Result Date: 12/30/2022   Prox LAD lesion  is 70% stenosed.  Prox LAD to Mid LAD lesion is 95% stenosed.   Mid LAD-1 lesion is 30% stenosed with 75% stenosed side branch in 2nd Diag.   Mid LAD-2 lesion is 75% stenosed.   Mid Cx lesion is 60% stenosed.   Prox RCA to Mid RCA lesion is 95% stenosed.  Mid RCA lesion is 50% stenosed.   The left ventricular systolic function is normal.  The left ventricular ejection fraction is 50-55% by visual estimate.   LV end diastolic pressure is normal.   There is no aortic valve stenosis. POST-CATH FINDINGS Severe MV CAD: CULPRIT: SEVERE ULCERATED ECCENTRIC 95% proximal LAD at small diagonal branch (prior to major 2nd Diag), 30% stenosis at 2nd Diag with 75% ostial diag sidebranch; mid LAD concentric focal 75%; Proximal-mid RCA 95% ulcerated plaque tapering up to 50% (secondary culprit) Proximal LCx 60 to 65% prior to trifurcation into OM1, OM 2 and OM 3. Preserved LVEF with normal EDP (EF estimated 50 to 55%-cannot fully exclude mild anterior wall motion normality  but appears to be relatively normal) recommend 2D Echo. RECOMMENDATIONS Consider CVTS consultation for for revascularization as a potential preferred option over multivessel PCI with least 2 lesions in the LAD and 1 long RCA lesion.  If PCI is chosen, would probably only treat the RCA and LAD however CABG would also allow revascularization of diagonal branch and OM's. Restart IV heparin 2 hours after TR band removal Check 2D Echo Bryan Lemma, MD   Cardiac Studies   TTE 12/29/22  1. Left ventricular ejection fraction, by estimation, is 55 to 60%. The  left ventricle has normal function. The left ventricle has no regional  wall motion abnormalities. The left ventricular internal cavity size was  mildly to moderately dilated. Left  ventricular diastolic parameters are consistent with Grade I diastolic  dysfunction (impaired relaxation).   2. Right ventricular systolic function is normal. The right ventricular  size is normal. Tricuspid regurgitation signal is inadequate for assessing  PA pressure.   3. The mitral valve is grossly normal. Trivial mitral valve  regurgitation. No evidence of mitral stenosis.   4. The aortic valve is tricuspid. Aortic valve regurgitation is not  visualized. No aortic stenosis is present.   Cath 12/30/22 Severe MV CAD:  CULPRIT: SEVERE ULCERATED ECCENTRIC 95% proximal LAD at small diagonal branch (prior to major 2nd Diag), 30% stenosis at 2nd Diag with 75% ostial diag sidebranch; mid LAD concentric focal 75%; Proximal-mid RCA 95% ulcerated plaque tapering up to 50% (secondary culprit) Proximal LCx 60 to 65% prior to trifurcation into OM1, OM 2 and OM 3. Preserved LVEF with normal EDP (EF estimated 50 to 55%-cannot fully exclude mild anterior wall motion normality but appears to be relatively normal) recommend 2D Echo.      RECOMMENDATIONS Consider CVTS consultation for for revascularization as a potential preferred option over multivessel PCI with least 2 lesions  in the LAD and 1 long RCA lesion.   If PCI is chosen, would probably only treat the RCA and LAD however CABG would also allow revascularization of diagonal branch and OM's.  Patient Profile     62 y.o. male medical history significant of hypertension, hyperlipidemia, GERD, diabetes, COPD, childhood epilepsy, chronic hepatitis C, obesity, OSA, CAD status post MI, cocaine use, anxiety, low back pain presenting with chest pain and rules in for NSTEMI   Assessment & Plan    1.  Non-STEMI:  Continue hep gtt, Imdur, atorvastatin 80, and increase lopressor to 37.5mg   bid.  Awaiting surgical opinion.  If not a candidate for surgical revascularization, would proceed with PCI of LAD and RCA. 2.  Hypertension: BP stable. 3.  Hyperlipidemia: Atorvastatin 80 mg daily 4.  Tobacco abuse: Continue nicotine replacement.    For questions or updates, please contact [facility] Please consult [email or web address] for contact info under        Signed, Orbie Pyo, MD  01/01/2023, 8:36 AM
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
## 2023-01-02 ENCOUNTER — Encounter (HOSPITAL_COMMUNITY): Payer: Self-pay | Admitting: Internal Medicine

## 2023-01-02 ENCOUNTER — Inpatient Hospital Stay (HOSPITAL_COMMUNITY): Payer: 59

## 2023-01-02 ENCOUNTER — Inpatient Hospital Stay (HOSPITAL_COMMUNITY)
Admission: EM | Disposition: A | Discharge status: Home / Self Care | Payer: Self-pay | Source: Home / Self Care | Attending: Thoracic Surgery (Cardiothoracic Vascular Surgery)

## 2023-01-02 DIAGNOSIS — I2510 Atherosclerotic heart disease of native coronary artery without angina pectoris: Secondary | ICD-10-CM

## 2023-01-02 DIAGNOSIS — I214 Non-ST elevation (NSTEMI) myocardial infarction: Secondary | ICD-10-CM | POA: Diagnosis not present

## 2023-01-02 DIAGNOSIS — J449 Chronic obstructive pulmonary disease, unspecified: Secondary | ICD-10-CM

## 2023-01-02 DIAGNOSIS — I10 Essential (primary) hypertension: Secondary | ICD-10-CM

## 2023-01-02 DIAGNOSIS — F17210 Nicotine dependence, cigarettes, uncomplicated: Secondary | ICD-10-CM

## 2023-01-02 DIAGNOSIS — Z951 Presence of aortocoronary bypass graft: Secondary | ICD-10-CM

## 2023-01-02 HISTORY — PX: CORONARY ARTERY BYPASS GRAFT: SHX141

## 2023-01-02 HISTORY — PX: RADIAL ARTERY HARVEST: SHX5067

## 2023-01-02 HISTORY — PX: TEE WITHOUT CARDIOVERSION: SHX5443

## 2023-01-02 LAB — POCT I-STAT 7, (LYTES, BLD GAS, ICA,H+H)
Acid-base deficit: 2 mmol/L (ref 0.0–2.0)
Acid-base deficit: 2 mmol/L (ref 0.0–2.0)
Acid-base deficit: 2 mmol/L (ref 0.0–2.0)
Acid-base deficit: 2 mmol/L (ref 0.0–2.0)
Acid-base deficit: 2 mmol/L (ref 0.0–2.0)
Acid-base deficit: 2 mmol/L (ref 0.0–2.0)
Bicarbonate: 23.7 mmol/L (ref 20.0–28.0)
Bicarbonate: 24.7 mmol/L (ref 20.0–28.0)
Bicarbonate: 25.9 mmol/L (ref 20.0–28.0)
Bicarbonate: 24.5 mmol/L (ref 20.0–28.0)
Bicarbonate: 25.1 mmol/L (ref 20.0–28.0)
Bicarbonate: 24.4 mmol/L (ref 20.0–28.0)
Calcium, Ion: 1.04 mmol/L — ABNORMAL LOW (ref 1.15–1.40)
Calcium, Ion: 1.11 mmol/L — ABNORMAL LOW (ref 1.15–1.40)
Calcium, Ion: 1.15 mmol/L (ref 1.15–1.40)
Calcium, Ion: 1.16 mmol/L (ref 1.15–1.40)
Calcium, Ion: 1.17 mmol/L (ref 1.15–1.40)
Calcium, Ion: 1.15 mmol/L (ref 1.15–1.40)
HCT: 32 % — ABNORMAL LOW (ref 39.0–52.0)
HCT: 31 % — ABNORMAL LOW (ref 39.0–52.0)
HCT: 35 % — ABNORMAL LOW (ref 39.0–52.0)
HCT: 37 % — ABNORMAL LOW (ref 39.0–52.0)
HCT: 37 % — ABNORMAL LOW (ref 39.0–52.0)
HCT: 39 % (ref 39.0–52.0)
Hemoglobin: 10.9 g/dL — ABNORMAL LOW (ref 13.0–17.0)
Hemoglobin: 10.5 g/dL — ABNORMAL LOW (ref 13.0–17.0)
Hemoglobin: 11.9 g/dL — ABNORMAL LOW (ref 13.0–17.0)
Hemoglobin: 12.6 g/dL — ABNORMAL LOW (ref 13.0–17.0)
Hemoglobin: 12.6 g/dL — ABNORMAL LOW (ref 13.0–17.0)
Hemoglobin: 13.3 g/dL (ref 13.0–17.0)
O2 Saturation: 100 %
O2 Saturation: 95 %
O2 Saturation: 94 %
O2 Saturation: 96 %
O2 Saturation: 98 %
O2 Saturation: 85 %
Patient temperature: 36.7
Patient temperature: 98.1
Patient temperature: 98.3
Patient temperature: 97.8
Potassium: 4.3 mmol/L (ref 3.5–5.1)
Potassium: 4.2 mmol/L (ref 3.5–5.1)
Potassium: 4.2 mmol/L (ref 3.5–5.1)
Potassium: 4.6 mmol/L (ref 3.5–5.1)
Potassium: 4.6 mmol/L (ref 3.5–5.1)
Potassium: 4.4 mmol/L (ref 3.5–5.1)
Sodium: 139 mmol/L (ref 135–145)
Sodium: 141 mmol/L (ref 135–145)
Sodium: 141 mmol/L (ref 135–145)
Sodium: 141 mmol/L (ref 135–145)
Sodium: 143 mmol/L (ref 135–145)
Sodium: 141 mmol/L (ref 135–145)
TCO2: 25 mmol/L (ref 22–32)
TCO2: 26 mmol/L (ref 22–32)
TCO2: 28 mmol/L (ref 22–32)
TCO2: 26 mmol/L (ref 22–32)
TCO2: 27 mmol/L (ref 22–32)
TCO2: 26 mmol/L (ref 22–32)
pCO2 arterial: 45.1 mmHg (ref 32–48)
pCO2 arterial: 50.5 mmHg — ABNORMAL HIGH (ref 32–48)
pCO2 arterial: 58.1 mmHg — ABNORMAL HIGH (ref 32–48)
pCO2 arterial: 49.3 mmHg — ABNORMAL HIGH (ref 32–48)
pCO2 arterial: 49.3 mmHg — ABNORMAL HIGH (ref 32–48)
pCO2 arterial: 45.9 mmHg (ref 32–48)
pH, Arterial: 7.329 — ABNORMAL LOW (ref 7.35–7.45)
pH, Arterial: 7.297 — ABNORMAL LOW (ref 7.35–7.45)
pH, Arterial: 7.255 — ABNORMAL LOW (ref 7.35–7.45)
pH, Arterial: 7.303 — ABNORMAL LOW (ref 7.35–7.45)
pH, Arterial: 7.313 — ABNORMAL LOW (ref 7.35–7.45)
pH, Arterial: 7.332 — ABNORMAL LOW (ref 7.35–7.45)
pO2, Arterial: 372 mmHg — ABNORMAL HIGH (ref 83–108)
pO2, Arterial: 83 mmHg (ref 83–108)
pO2, Arterial: 84 mmHg (ref 83–108)
pO2, Arterial: 114 mmHg — ABNORMAL HIGH (ref 83–108)
pO2, Arterial: 87 mmHg (ref 83–108)
pO2, Arterial: 53 mmHg — ABNORMAL LOW (ref 83–108)

## 2023-01-02 LAB — POCT I-STAT, CHEM 8
BUN: 17 mg/dL (ref 6–20)
BUN: 15 mg/dL (ref 6–20)
BUN: 15 mg/dL (ref 6–20)
BUN: 16 mg/dL (ref 6–20)
BUN: 17 mg/dL (ref 6–20)
BUN: 16 mg/dL (ref 6–20)
Calcium, Ion: 1.27 mmol/L (ref 1.15–1.40)
Calcium, Ion: 1.2 mmol/L (ref 1.15–1.40)
Calcium, Ion: 1.08 mmol/L — ABNORMAL LOW (ref 1.15–1.40)
Calcium, Ion: 1.09 mmol/L — ABNORMAL LOW (ref 1.15–1.40)
Calcium, Ion: 1.11 mmol/L — ABNORMAL LOW (ref 1.15–1.40)
Calcium, Ion: 1.1 mmol/L — ABNORMAL LOW (ref 1.15–1.40)
Chloride: 103 mmol/L (ref 98–111)
Chloride: 106 mmol/L (ref 98–111)
Chloride: 104 mmol/L (ref 98–111)
Chloride: 104 mmol/L (ref 98–111)
Chloride: 104 mmol/L (ref 98–111)
Chloride: 104 mmol/L (ref 98–111)
Creatinine, Ser: 0.7 mg/dL (ref 0.61–1.24)
Creatinine, Ser: 0.7 mg/dL (ref 0.61–1.24)
Creatinine, Ser: 0.6 mg/dL — ABNORMAL LOW (ref 0.61–1.24)
Creatinine, Ser: 0.7 mg/dL (ref 0.61–1.24)
Creatinine, Ser: 0.7 mg/dL (ref 0.61–1.24)
Creatinine, Ser: 0.7 mg/dL (ref 0.61–1.24)
Glucose, Bld: 118 mg/dL — ABNORMAL HIGH (ref 70–99)
Glucose, Bld: 131 mg/dL — ABNORMAL HIGH (ref 70–99)
Glucose, Bld: 136 mg/dL — ABNORMAL HIGH (ref 70–99)
Glucose, Bld: 153 mg/dL — ABNORMAL HIGH (ref 70–99)
Glucose, Bld: 150 mg/dL — ABNORMAL HIGH (ref 70–99)
Glucose, Bld: 134 mg/dL — ABNORMAL HIGH (ref 70–99)
HCT: 43 % (ref 39.0–52.0)
HCT: 37 % — ABNORMAL LOW (ref 39.0–52.0)
HCT: 33 % — ABNORMAL LOW (ref 39.0–52.0)
HCT: 31 % — ABNORMAL LOW (ref 39.0–52.0)
HCT: 31 % — ABNORMAL LOW (ref 39.0–52.0)
HCT: 33 % — ABNORMAL LOW (ref 39.0–52.0)
Hemoglobin: 14.6 g/dL (ref 13.0–17.0)
Hemoglobin: 12.6 g/dL — ABNORMAL LOW (ref 13.0–17.0)
Hemoglobin: 11.2 g/dL — ABNORMAL LOW (ref 13.0–17.0)
Hemoglobin: 10.5 g/dL — ABNORMAL LOW (ref 13.0–17.0)
Hemoglobin: 10.5 g/dL — ABNORMAL LOW (ref 13.0–17.0)
Hemoglobin: 11.2 g/dL — ABNORMAL LOW (ref 13.0–17.0)
Potassium: 3.9 mmol/L (ref 3.5–5.1)
Potassium: 3.8 mmol/L (ref 3.5–5.1)
Potassium: 5.1 mmol/L (ref 3.5–5.1)
Potassium: 5.2 mmol/L — ABNORMAL HIGH (ref 3.5–5.1)
Potassium: 4.8 mmol/L (ref 3.5–5.1)
Potassium: 4.2 mmol/L (ref 3.5–5.1)
Sodium: 141 mmol/L (ref 135–145)
Sodium: 141 mmol/L (ref 135–145)
Sodium: 138 mmol/L (ref 135–145)
Sodium: 138 mmol/L (ref 135–145)
Sodium: 140 mmol/L (ref 135–145)
Sodium: 140 mmol/L (ref 135–145)
TCO2: 32 mmol/L (ref 22–32)
TCO2: 27 mmol/L (ref 22–32)
TCO2: 24 mmol/L (ref 22–32)
TCO2: 25 mmol/L (ref 22–32)
TCO2: 27 mmol/L (ref 22–32)
TCO2: 24 mmol/L (ref 22–32)

## 2023-01-02 LAB — COMPREHENSIVE METABOLIC PANEL
ALT: 144 U/L — ABNORMAL HIGH (ref 0–44)
AST: 82 U/L — ABNORMAL HIGH (ref 15–41)
Albumin: 4.1 g/dL (ref 3.5–5.0)
Alkaline Phosphatase: 54 U/L (ref 38–126)
Anion gap: 10 (ref 5–15)
BUN: 16 mg/dL (ref 6–20)
CO2: 25 mmol/L (ref 22–32)
Calcium: 9.3 mg/dL (ref 8.9–10.3)
Chloride: 104 mmol/L (ref 98–111)
Creatinine, Ser: 0.83 mg/dL (ref 0.61–1.24)
GFR, Estimated: >60 mL/min (ref >60)
Glucose, Bld: 130 mg/dL — ABNORMAL HIGH (ref 70–99)
Potassium: 4.1 mmol/L (ref 3.5–5.1)
Sodium: 139 mmol/L (ref 135–145)
Total Bilirubin: 1.6 mg/dL — ABNORMAL HIGH (ref 0.3–1.2)
Total Protein: 7 g/dL (ref 6.5–8.1)

## 2023-01-02 LAB — BASIC METABOLIC PANEL
Anion gap: 11 (ref 5–15)
BUN: 14 mg/dL (ref 6–20)
CO2: 24 mmol/L (ref 22–32)
Calcium: 8.1 mg/dL — ABNORMAL LOW (ref 8.9–10.3)
Chloride: 105 mmol/L (ref 98–111)
Creatinine, Ser: 0.77 mg/dL (ref 0.61–1.24)
GFR, Estimated: >60 mL/min (ref >60)
Glucose, Bld: 136 mg/dL — ABNORMAL HIGH (ref 70–99)
Potassium: 4.3 mmol/L (ref 3.5–5.1)
Sodium: 140 mmol/L (ref 135–145)

## 2023-01-02 LAB — MRSA NEXT GEN BY PCR, NASAL: MRSA by PCR Next Gen: NOT DETECTED

## 2023-01-02 LAB — CBC
HCT: 48.8 % (ref 39.0–52.0)
HCT: 38.2 % — ABNORMAL LOW (ref 39.0–52.0)
HCT: 38.9 % — ABNORMAL LOW (ref 39.0–52.0)
Hemoglobin: 16.7 g/dL (ref 13.0–17.0)
Hemoglobin: 12.9 g/dL — ABNORMAL LOW (ref 13.0–17.0)
Hemoglobin: 13.2 g/dL (ref 13.0–17.0)
MCH: 30.8 pg (ref 26.0–34.0)
MCH: 30.6 pg (ref 26.0–34.0)
MCH: 30.4 pg (ref 26.0–34.0)
MCHC: 34.2 g/dL (ref 30.0–36.0)
MCHC: 33.8 g/dL (ref 30.0–36.0)
MCHC: 33.9 g/dL (ref 30.0–36.0)
MCV: 90 fL (ref 80.0–100.0)
MCV: 90.5 fL (ref 80.0–100.0)
MCV: 89.6 fL (ref 80.0–100.0)
Platelets: 140 10*3/uL — ABNORMAL LOW (ref 150–400)
Platelets: 96 10*3/uL — ABNORMAL LOW (ref 150–400)
Platelets: 119 10*3/uL — ABNORMAL LOW (ref 150–400)
RBC: 5.42 MIL/uL (ref 4.22–5.81)
RBC: 4.22 MIL/uL (ref 4.22–5.81)
RBC: 4.34 MIL/uL (ref 4.22–5.81)
RDW: 12.6 % (ref 11.5–15.5)
RDW: 12.6 % (ref 11.5–15.5)
RDW: 12.6 % (ref 11.5–15.5)
WBC: 6.7 10*3/uL (ref 4.0–10.5)
WBC: 8.9 10*3/uL (ref 4.0–10.5)
WBC: 9.2 10*3/uL (ref 4.0–10.5)
nRBC: 0 % (ref 0.0–0.2)
nRBC: 0 % (ref 0.0–0.2)
nRBC: 0 % (ref 0.0–0.2)

## 2023-01-02 LAB — POCT I-STAT EG7
Acid-base deficit: 1 mmol/L (ref 0.0–2.0)
Bicarbonate: 26.5 mmol/L (ref 20.0–28.0)
Calcium, Ion: 1.13 mmol/L — ABNORMAL LOW (ref 1.15–1.40)
HCT: 33 % — ABNORMAL LOW (ref 39.0–52.0)
Hemoglobin: 11.2 g/dL — ABNORMAL LOW (ref 13.0–17.0)
O2 Saturation: 82 %
Potassium: 4 mmol/L (ref 3.5–5.1)
Sodium: 141 mmol/L (ref 135–145)
TCO2: 28 mmol/L (ref 22–32)
pCO2, Ven: 53.9 mmHg (ref 44–60)
pH, Ven: 7.299 (ref 7.25–7.43)
pO2, Ven: 53 mmHg — ABNORMAL HIGH (ref 32–45)

## 2023-01-02 LAB — ECHO INTRAOPERATIVE TEE
Height: 72 in
Weight: 4380.98 oz

## 2023-01-02 LAB — COOXEMETRY PANEL
Carboxyhemoglobin: 2.2 % — ABNORMAL HIGH (ref 0.5–1.5)
Methemoglobin: <0.7 % (ref 0.0–1.5)
O2 Saturation: 64.9 %
Total hemoglobin: 13.8 g/dL (ref 12.0–16.0)

## 2023-01-02 LAB — TYPE AND SCREEN
ABO/RH(D): A NEG
Antibody Screen: NEGATIVE

## 2023-01-02 LAB — APTT
aPTT: 89 s — ABNORMAL HIGH (ref 24–36)
aPTT: 29 seconds (ref 24–36)

## 2023-01-02 LAB — URINALYSIS, ROUTINE W REFLEX MICROSCOPIC
Bilirubin Urine: NEGATIVE
Glucose, UA: NEGATIVE mg/dL
Ketones, ur: NEGATIVE mg/dL
Nitrite: POSITIVE — AB
Protein, ur: NEGATIVE mg/dL
Specific Gravity, Urine: 1.021 (ref 1.005–1.030)
WBC, UA: >50 WBC/hpf (ref 0–5)
pH: 5 (ref 5.0–8.0)

## 2023-01-02 LAB — BLOOD GAS, ARTERIAL
Acid-Base Excess: 3.4 mmol/L — ABNORMAL HIGH (ref 0.0–2.0)
Bicarbonate: 28.5 mmol/L — ABNORMAL HIGH (ref 20.0–28.0)
O2 Saturation: 96.4 %
Patient temperature: 37
pCO2 arterial: 44 mmHg (ref 32–48)
pH, Arterial: 7.42 (ref 7.35–7.45)
pO2, Arterial: 69 mmHg — ABNORMAL LOW (ref 83–108)

## 2023-01-02 LAB — PROTIME-INR
INR: 1 (ref 0.8–1.2)
INR: 1.3 — ABNORMAL HIGH (ref 0.8–1.2)
Prothrombin Time: 13.1 s (ref 11.4–15.2)
Prothrombin Time: 16.2 seconds — ABNORMAL HIGH (ref 11.4–15.2)

## 2023-01-02 LAB — GLUCOSE, CAPILLARY
Glucose-Capillary: 132 mg/dL — ABNORMAL HIGH (ref 70–99)
Glucose-Capillary: 103 mg/dL — ABNORMAL HIGH (ref 70–99)
Glucose-Capillary: 127 mg/dL — ABNORMAL HIGH (ref 70–99)
Glucose-Capillary: 130 mg/dL — ABNORMAL HIGH (ref 70–99)
Glucose-Capillary: 133 mg/dL — ABNORMAL HIGH (ref 70–99)
Glucose-Capillary: 137 mg/dL — ABNORMAL HIGH (ref 70–99)
Glucose-Capillary: 149 mg/dL — ABNORMAL HIGH (ref 70–99)
Glucose-Capillary: 125 mg/dL — ABNORMAL HIGH (ref 70–99)

## 2023-01-02 LAB — PLATELET COUNT: Platelets: 107 10*3/uL — ABNORMAL LOW (ref 150–400)

## 2023-01-02 LAB — HEMOGLOBIN A1C
Hgb A1c MFr Bld: 6.6 % — ABNORMAL HIGH (ref 4.8–5.6)
Mean Plasma Glucose: 142.72 mg/dL

## 2023-01-02 LAB — HEMOGLOBIN AND HEMATOCRIT, BLOOD
HCT: 32.6 % — ABNORMAL LOW (ref 39.0–52.0)
Hemoglobin: 11.2 g/dL — ABNORMAL LOW (ref 13.0–17.0)

## 2023-01-02 LAB — SARS CORONAVIRUS 2 BY RT PCR: SARS Coronavirus 2 by RT PCR: NEGATIVE

## 2023-01-02 LAB — MAGNESIUM: Magnesium: 2.2 mg/dL (ref 1.7–2.4)

## 2023-01-02 LAB — HEPARIN LEVEL (UNFRACTIONATED): Heparin Unfractionated: 0.54 [IU]/mL (ref 0.30–0.70)

## 2023-01-02 SURGERY — CORONARY ARTERY BYPASS GRAFTING (CABG)
Anesthesia: General | Site: Chest | Laterality: Right

## 2023-01-02 MED ORDER — SODIUM CHLORIDE 0.9% FLUSH: 3.0000 mL | INTRAVENOUS | Status: DC | PRN

## 2023-01-02 MED ORDER — FENTANYL CITRATE (PF) 250 MCG/5ML IJ SOLN
INTRAMUSCULAR | Status: DC | PRN
  Administered 2023-01-02 (×3): 100 ug via INTRAVENOUS
  Administered 2023-01-02: 50 ug via INTRAVENOUS
  Administered 2023-01-02 (×2): 100 ug via INTRAVENOUS
  Administered 2023-01-02 (×2): 50 ug via INTRAVENOUS
  Administered 2023-01-02 (×2): 100 ug via INTRAVENOUS

## 2023-01-02 MED ORDER — ALBUMIN HUMAN 5 % IV SOLN: INTRAVENOUS | Status: DC | PRN

## 2023-01-02 MED ORDER — HEPARIN SODIUM (PORCINE) 1000 UNIT/ML IJ SOLN
INTRAMUSCULAR | Status: AC
  Filled 2023-01-02: qty 1

## 2023-01-02 MED ORDER — NITROGLYCERIN IN D5W 200-5 MCG/ML-% IV SOLN: 7.0000 ug/min | INTRAVENOUS | Status: AC

## 2023-01-02 MED ORDER — PROPOFOL 10 MG/ML IV BOLUS
INTRAVENOUS | Status: DC | PRN
  Administered 2023-01-02: 80 mg via INTRAVENOUS
  Administered 2023-01-02: 50 mg via INTRAVENOUS
  Administered 2023-01-02: 70 mg via INTRAVENOUS

## 2023-01-02 MED ORDER — PANTOPRAZOLE SODIUM 40 MG IV SOLR
40.0000 mg | Freq: Every day | INTRAVENOUS | Status: AC
  Administered 2023-01-02 – 2023-01-03 (×2): 40 mg via INTRAVENOUS
  Filled 2023-01-02 (×2): qty 10

## 2023-01-02 MED ORDER — ACETAMINOPHEN 500 MG PO TABS
1000.0000 mg | ORAL_TABLET | Freq: Four times a day (QID) | ORAL | Status: AC
  Administered 2023-01-03 – 2023-01-07 (×16): 1000 mg via ORAL
  Filled 2023-01-02 (×17): qty 2

## 2023-01-02 MED ORDER — CHLORHEXIDINE GLUCONATE CLOTH 2 % EX PADS
6.0000 | MEDICATED_PAD | Freq: Every day | CUTANEOUS | Status: DC
  Administered 2023-01-02 – 2023-01-08 (×6): 6 via TOPICAL

## 2023-01-02 MED ORDER — DEXTROSE 50 % IV SOLN: 0.0000 mL | INTRAVENOUS | Status: DC | PRN

## 2023-01-02 MED ORDER — ROCURONIUM BROMIDE 10 MG/ML (PF) SYRINGE
PREFILLED_SYRINGE | INTRAVENOUS | Status: AC
  Filled 2023-01-02: qty 20

## 2023-01-02 MED ORDER — ROCURONIUM BROMIDE 10 MG/ML (PF) SYRINGE
PREFILLED_SYRINGE | INTRAVENOUS | Status: DC | PRN
  Administered 2023-01-02: 50 mg via INTRAVENOUS
  Administered 2023-01-02: 100 mg via INTRAVENOUS
  Administered 2023-01-02: 50 mg via INTRAVENOUS
  Administered 2023-01-02 (×2): 100 mg via INTRAVENOUS

## 2023-01-02 MED ORDER — DEXMEDETOMIDINE HCL IN NACL 400 MCG/100ML IV SOLN: 0.0000 ug/kg/h | INTRAVENOUS | Status: DC

## 2023-01-02 MED ORDER — MIDAZOLAM HCL 2 MG/2ML IJ SOLN
2.0000 mg | INTRAMUSCULAR | Status: DC | PRN
  Administered 2023-01-02: 2 mg via INTRAVENOUS
  Filled 2023-01-02: qty 2

## 2023-01-02 MED ORDER — METOCLOPRAMIDE HCL 5 MG/ML IJ SOLN
10.0000 mg | Freq: Four times a day (QID) | INTRAMUSCULAR | Status: AC
  Administered 2023-01-02 – 2023-01-03 (×6): 10 mg via INTRAVENOUS
  Filled 2023-01-02 (×6): qty 2

## 2023-01-02 MED ORDER — LACTATED RINGERS IV SOLN: INTRAVENOUS | Status: DC

## 2023-01-02 MED ORDER — DOCUSATE SODIUM 100 MG PO CAPS
200.0000 mg | ORAL_CAPSULE | Freq: Every day | ORAL | Status: DC
  Administered 2023-01-03 – 2023-01-09 (×5): 200 mg via ORAL
  Filled 2023-01-02 (×5): qty 2

## 2023-01-02 MED ORDER — NITROGLYCERIN 0.2 MG/ML ON CALL CATH LAB
INTRAVENOUS | Status: DC | PRN
  Administered 2023-01-02: 10 ug via INTRAVENOUS
  Administered 2023-01-02 (×2): 20 ug via INTRAVENOUS

## 2023-01-02 MED ORDER — PROPOFOL 10 MG/ML IV BOLUS
INTRAVENOUS | Status: AC
  Filled 2023-01-02: qty 20

## 2023-01-02 MED ORDER — ROCURONIUM BROMIDE 10 MG/ML (PF) SYRINGE
PREFILLED_SYRINGE | INTRAVENOUS | Status: AC
  Filled 2023-01-02: qty 10

## 2023-01-02 MED ORDER — POTASSIUM CHLORIDE 10 MEQ/50ML IV SOLN
10.0000 meq | INTRAVENOUS | Status: AC
  Administered 2023-01-02: 10 meq via INTRAVENOUS
  Filled 2023-01-02: qty 50

## 2023-01-02 MED ORDER — METOPROLOL TARTRATE 25 MG/10 ML ORAL SUSPENSION: 12.5000 mg | Freq: Two times a day (BID) | ORAL | Status: DC

## 2023-01-02 MED ORDER — ATORVASTATIN CALCIUM 80 MG PO TABS
80.0000 mg | ORAL_TABLET | Freq: Every day | ORAL | Status: DC
  Administered 2023-01-03 – 2023-01-09 (×7): 80 mg via ORAL
  Filled 2023-01-02 (×6): qty 1
  Filled 2023-01-02: qty 2

## 2023-01-02 MED ORDER — LACTATED RINGERS IV SOLN: INTRAVENOUS | Status: DC | PRN

## 2023-01-02 MED ORDER — ASPIRIN 81 MG PO CHEW
324.0000 mg | CHEWABLE_TABLET | Freq: Once | ORAL | Status: AC
  Administered 2023-01-02: 324 mg via ORAL
  Filled 2023-01-02: qty 4

## 2023-01-02 MED ORDER — ONDANSETRON HCL 4 MG/2ML IJ SOLN: 4.0000 mg | Freq: Four times a day (QID) | INTRAMUSCULAR | Status: DC | PRN

## 2023-01-02 MED ORDER — ACETAMINOPHEN 160 MG/5ML PO SOLN
650.0000 mg | Freq: Once | ORAL | Status: DC
  Filled 2023-01-02: qty 20.3

## 2023-01-02 MED ORDER — FENTANYL CITRATE (PF) 250 MCG/5ML IJ SOLN
INTRAMUSCULAR | Status: AC
  Filled 2023-01-02: qty 5

## 2023-01-02 MED ORDER — BISACODYL 5 MG PO TBEC
10.0000 mg | DELAYED_RELEASE_TABLET | Freq: Every day | ORAL | Status: DC
  Administered 2023-01-03 – 2023-01-09 (×3): 10 mg via ORAL
  Filled 2023-01-02 (×4): qty 2

## 2023-01-02 MED ORDER — PANTOPRAZOLE SODIUM 40 MG PO TBEC
40.0000 mg | DELAYED_RELEASE_TABLET | Freq: Every day | ORAL | Status: DC
  Administered 2023-01-04 – 2023-01-09 (×6): 40 mg via ORAL
  Filled 2023-01-02 (×6): qty 1

## 2023-01-02 MED ORDER — CEFAZOLIN SODIUM-DEXTROSE 2-4 GM/100ML-% IV SOLN
2.0000 g | Freq: Three times a day (TID) | INTRAVENOUS | Status: AC
  Administered 2023-01-02 – 2023-01-04 (×6): 2 g via INTRAVENOUS
  Filled 2023-01-02 (×6): qty 100

## 2023-01-02 MED ORDER — METOPROLOL TARTRATE 5 MG/5ML IV SOLN: 2.5000 mg | INTRAVENOUS | Status: DC | PRN

## 2023-01-02 MED ORDER — METOPROLOL TARTRATE 12.5 MG HALF TABLET
12.5000 mg | ORAL_TABLET | Freq: Two times a day (BID) | ORAL | Status: DC
  Administered 2023-01-02 – 2023-01-03 (×3): 12.5 mg via ORAL
  Filled 2023-01-02 (×3): qty 1

## 2023-01-02 MED ORDER — 0.9 % SODIUM CHLORIDE (POUR BTL) OPTIME
TOPICAL | Status: DC | PRN
  Administered 2023-01-02: 6000 mL

## 2023-01-02 MED ORDER — MIDAZOLAM HCL (PF) 10 MG/2ML IJ SOLN
INTRAMUSCULAR | Status: AC
  Filled 2023-01-02: qty 2

## 2023-01-02 MED ORDER — INSULIN REGULAR(HUMAN) IN NACL 100-0.9 UT/100ML-% IV SOLN: INTRAVENOUS | Status: DC

## 2023-01-02 MED ORDER — PHENYLEPHRINE 80 MCG/ML (10ML) SYRINGE FOR IV PUSH (FOR BLOOD PRESSURE SUPPORT)
PREFILLED_SYRINGE | INTRAVENOUS | Status: DC | PRN
  Administered 2023-01-02: 80 ug via INTRAVENOUS
  Administered 2023-01-02: 40 ug via INTRAVENOUS
  Administered 2023-01-02 (×2): 30 ug via INTRAVENOUS
  Administered 2023-01-02: 80 ug via INTRAVENOUS
  Administered 2023-01-02: 40 ug via INTRAVENOUS
  Administered 2023-01-02: 80 ug via INTRAVENOUS

## 2023-01-02 MED ORDER — SODIUM CHLORIDE 0.9% FLUSH
3.0000 mL | Freq: Two times a day (BID) | INTRAVENOUS | Status: DC
  Administered 2023-01-03 – 2023-01-08 (×9): 3 mL via INTRAVENOUS

## 2023-01-02 MED ORDER — VANCOMYCIN HCL IN DEXTROSE 1-5 GM/200ML-% IV SOLN
1000.0000 mg | Freq: Once | INTRAVENOUS | Status: AC
  Administered 2023-01-02: 1000 mg via INTRAVENOUS
  Filled 2023-01-02: qty 200

## 2023-01-02 MED ORDER — ALBUMIN HUMAN 5 % IV SOLN: 250.0000 mL | INTRAVENOUS | Status: DC | PRN

## 2023-01-02 MED ORDER — PHENYLEPHRINE 80 MCG/ML (10ML) SYRINGE FOR IV PUSH (FOR BLOOD PRESSURE SUPPORT)
PREFILLED_SYRINGE | INTRAVENOUS | Status: AC
  Filled 2023-01-02: qty 20

## 2023-01-02 MED ORDER — PHENYLEPHRINE HCL-NACL 20-0.9 MG/250ML-% IV SOLN
0.0000 ug/min | INTRAVENOUS | Status: DC
  Administered 2023-01-02: 0 ug/min via INTRAVENOUS

## 2023-01-02 MED ORDER — SODIUM CHLORIDE (PF) 0.9 % IJ SOLN: OROMUCOSAL | Status: DC | PRN

## 2023-01-02 MED ORDER — SODIUM CHLORIDE 0.45 % IV SOLN: INTRAVENOUS | Status: DC | PRN

## 2023-01-02 MED ORDER — CALCIUM CHLORIDE 10 % IV SOLN
INTRAVENOUS | Status: AC
  Filled 2023-01-02: qty 10

## 2023-01-02 MED ORDER — MAGNESIUM SULFATE 4 GM/100ML IV SOLN
4.0000 g | Freq: Once | INTRAVENOUS | Status: AC
  Administered 2023-01-02: 4 g via INTRAVENOUS
  Filled 2023-01-02: qty 100

## 2023-01-02 MED ORDER — HEPARIN SODIUM (PORCINE) 1000 UNIT/ML IJ SOLN
INTRAMUSCULAR | Status: AC
  Filled 2023-01-02: qty 10

## 2023-01-02 MED ORDER — ASPIRIN 325 MG PO TBEC
325.0000 mg | DELAYED_RELEASE_TABLET | Freq: Every day | ORAL | Status: DC
  Administered 2023-01-03 – 2023-01-05 (×3): 325 mg via ORAL
  Filled 2023-01-02 (×3): qty 1

## 2023-01-02 MED ORDER — ACETAMINOPHEN 160 MG/5ML PO SOLN: 1000.0000 mg | Freq: Four times a day (QID) | ORAL | Status: AC

## 2023-01-02 MED ORDER — SODIUM CHLORIDE 0.9 % IV SOLN: INTRAVENOUS | Status: DC

## 2023-01-02 MED ORDER — PROTAMINE SULFATE 10 MG/ML IV SOLN
INTRAVENOUS | Status: DC | PRN
  Administered 2023-01-02: 350 mg via INTRAVENOUS

## 2023-01-02 MED ORDER — BISACODYL 10 MG RE SUPP: 10.0000 mg | Freq: Every day | RECTAL | Status: DC

## 2023-01-02 MED ORDER — MIDAZOLAM HCL (PF) 5 MG/ML IJ SOLN
INTRAMUSCULAR | Status: DC | PRN
  Administered 2023-01-02: 2 mg via INTRAVENOUS
  Administered 2023-01-02: 1 mg via INTRAVENOUS
  Administered 2023-01-02 (×2): 2 mg via INTRAVENOUS

## 2023-01-02 MED ORDER — OXYCODONE HCL 5 MG PO TABS
5.0000 mg | ORAL_TABLET | ORAL | Status: DC | PRN
  Administered 2023-01-03 – 2023-01-09 (×22): 10 mg via ORAL
  Filled 2023-01-02: qty 2
  Filled 2023-01-02: qty 1
  Filled 2023-01-02 (×8): qty 2
  Filled 2023-01-02: qty 1
  Filled 2023-01-02 (×13): qty 2

## 2023-01-02 MED ORDER — METOPROLOL TARTRATE 5 MG/5ML IV SOLN
2.5000 mg | INTRAVENOUS | Status: DC | PRN
  Administered 2023-01-04 – 2023-01-06 (×9): 5 mg via INTRAVENOUS
  Filled 2023-01-02 (×8): qty 5

## 2023-01-02 MED ORDER — LIDOCAINE 2% (20 MG/ML) 5 ML SYRINGE
INTRAMUSCULAR | Status: AC
  Filled 2023-01-02: qty 5

## 2023-01-02 MED ORDER — ASPIRIN 81 MG PO CHEW: 324.0000 mg | CHEWABLE_TABLET | Freq: Every day | ORAL | Status: DC

## 2023-01-02 MED ORDER — LIDOCAINE HCL (CARDIAC) PF 100 MG/5ML IV SOSY
PREFILLED_SYRINGE | INTRAVENOUS | Status: DC | PRN
  Administered 2023-01-02: 80 mg via INTRATRACHEAL

## 2023-01-02 MED ORDER — SODIUM CHLORIDE 0.9 % IV SOLN: 250.0000 mL | INTRAVENOUS | Status: DC

## 2023-01-02 MED ORDER — MORPHINE SULFATE (PF) 2 MG/ML IV SOLN
1.0000 mg | INTRAVENOUS | Status: DC | PRN
  Administered 2023-01-02: 2 mg via INTRAVENOUS
  Administered 2023-01-02: 4 mg via INTRAVENOUS
  Administered 2023-01-02: 2 mg via INTRAVENOUS
  Administered 2023-01-03: 4 mg via INTRAVENOUS
  Administered 2023-01-03 – 2023-01-04 (×5): 2 mg via INTRAVENOUS
  Administered 2023-01-05 – 2023-01-06 (×3): 4 mg via INTRAVENOUS
  Filled 2023-01-02: qty 2
  Filled 2023-01-02 (×2): qty 1
  Filled 2023-01-02 (×2): qty 2
  Filled 2023-01-02 (×2): qty 1
  Filled 2023-01-02: qty 2
  Filled 2023-01-02: qty 1
  Filled 2023-01-02 (×3): qty 2

## 2023-01-02 MED ORDER — ACETAMINOPHEN 325 MG PO TABS
650.0000 mg | ORAL_TABLET | Freq: Once | ORAL | Status: AC
  Administered 2023-01-02: 650 mg via ORAL
  Filled 2023-01-02: qty 2

## 2023-01-02 MED ORDER — HEMOSTATIC AGENTS (NO CHARGE) OPTIME
TOPICAL | Status: DC | PRN
Start: 2023-01-02 — End: 2023-01-02
  Administered 2023-01-02: 1 via TOPICAL

## 2023-01-02 MED ORDER — PLASMA-LYTE A IV SOLN: INTRAVENOUS | Status: DC | PRN

## 2023-01-02 MED ORDER — HEPARIN SODIUM (PORCINE) 1000 UNIT/ML IJ SOLN
INTRAMUSCULAR | Status: DC | PRN
  Administered 2023-01-02: 40000 [IU] via INTRAVENOUS

## 2023-01-02 MED ORDER — CHLORHEXIDINE GLUCONATE 0.12 % MT SOLN
15.0000 mL | OROMUCOSAL | Status: AC
  Administered 2023-01-02: 15 mL via OROMUCOSAL
  Filled 2023-01-02: qty 15

## 2023-01-02 SURGICAL SUPPLY — 101 items
ADH SKN CLS APL DERMABOND .7 (GAUZE/BANDAGES/DRESSINGS) ×3
APPLIER CLIP 9.375 SM OPEN (CLIP)
APR CLP SM 9.3 20 MLT OPN (CLIP)
BAG DECANTER FOR FLEXI CONT (MISCELLANEOUS) ×3 IMPLANT
BLADE CLIPPER SURG (BLADE) ×3 IMPLANT
BLADE STERNUM SYSTEM 6 (BLADE) ×3 IMPLANT
BLADE SURG 15 STRL LF DISP TIS (BLADE) IMPLANT
BLADE SURG 15 STRL SS (BLADE) ×3
BNDG CMPR 5X4 KNIT ELC UNQ LF (GAUZE/BANDAGES/DRESSINGS) ×6
BNDG CMPR 6 X 5 YARDS HK CLSR (GAUZE/BANDAGES/DRESSINGS) ×3
BNDG ELASTIC 4INX 5YD STR LF (GAUZE/BANDAGES/DRESSINGS) IMPLANT
BNDG ELASTIC 6INX 5YD STR LF (GAUZE/BANDAGES/DRESSINGS) IMPLANT
BNDG GAUZE DERMACEA FLUFF 4 (GAUZE/BANDAGES/DRESSINGS) ×3 IMPLANT
BNDG GZE DERMACEA 4 6PLY (GAUZE/BANDAGES/DRESSINGS) ×6
CANISTER SUCT 3000ML PPV (MISCELLANEOUS) ×3 IMPLANT
CANNULA EZ GLIDE 8.0 24FR (CANNULA) IMPLANT
CANNULA EZ GLIDE AORTIC 21FR (CANNULA) ×3 IMPLANT
CANNULA MC2 2 STG 36/46 NON-V (CANNULA) IMPLANT
CATH CPB KIT HENDRICKSON (MISCELLANEOUS) ×3 IMPLANT
CATH ROBINSON RED A/P 18FR (CATHETERS) ×3 IMPLANT
CATH THORACIC 36FR (CATHETERS) ×3 IMPLANT
CATH THORACIC 36FR RT ANG (CATHETERS) ×3 IMPLANT
CLIP APPLIE 9.375 SM OPEN (CLIP) IMPLANT
CLIP TI MEDIUM 24 (CLIP) IMPLANT
CLIP TI WIDE RED SMALL 24 (CLIP) IMPLANT
CONTAINER PROTECT SURGISLUSH (MISCELLANEOUS) ×6 IMPLANT
COVER MAYO STAND STRL (DRAPES) IMPLANT
DERMABOND ADVANCED .7 DNX12 (GAUZE/BANDAGES/DRESSINGS) IMPLANT
DRAPE CARDIOVASCULAR INCISE (DRAPES) ×3
DRAPE EXTREMITY T 121X128X90 (DISPOSABLE) ×3 IMPLANT
DRAPE HALF SHEET 40X57 (DRAPES) IMPLANT
DRAPE SRG 135X102X78XABS (DRAPES) ×3 IMPLANT
DRAPE WARM FLUID 44X44 (DRAPES) ×3 IMPLANT
DRSG COVADERM 4X14 (GAUZE/BANDAGES/DRESSINGS) ×3 IMPLANT
ELECT BLADE 4.0 EZ CLEAN MEGAD (MISCELLANEOUS) ×3
ELECT REM PT RETURN 9FT ADLT (ELECTROSURGICAL) ×6
ELECTRODE BLDE 4.0 EZ CLN MEGD (MISCELLANEOUS) IMPLANT
ELECTRODE REM PT RTRN 9FT ADLT (ELECTROSURGICAL) ×6 IMPLANT
FELT TEFLON 1X6 (MISCELLANEOUS) ×6 IMPLANT
GAUZE 4X4 16PLY ~~LOC~~+RFID DBL (SPONGE) ×3 IMPLANT
GAUZE SPONGE 4X4 12PLY STRL (GAUZE/BANDAGES/DRESSINGS) ×6 IMPLANT
GEL ULTRASOUND 20GR AQUASONIC (MISCELLANEOUS) IMPLANT
GLOVE BIO SURGEON STRL SZ 6.5 (GLOVE) IMPLANT
GLOVE BIO SURGEON STRL SZ7.5 (GLOVE) IMPLANT
GLOVE BIOGEL PI IND STRL 6 (GLOVE) IMPLANT
GLOVE BIOGEL PI IND STRL 6.5 (GLOVE) IMPLANT
GLOVE SS BIOGEL STRL SZ 6 (GLOVE) IMPLANT
GLOVE SS BIOGEL STRL SZ 7.5 (GLOVE) ×3 IMPLANT
GLOVE SURG SIGNA 7.5 PF LTX (GLOVE) ×9 IMPLANT
GOWN STRL REUS W/ TWL LRG LVL3 (GOWN DISPOSABLE) ×12 IMPLANT
GOWN STRL REUS W/ TWL XL LVL3 (GOWN DISPOSABLE) ×6 IMPLANT
GOWN STRL REUS W/TWL LRG LVL3 (GOWN DISPOSABLE) ×12
GOWN STRL REUS W/TWL XL LVL3 (GOWN DISPOSABLE) ×6
HEMOSTAT POWDER SURGIFOAM 1G (HEMOSTASIS) ×9 IMPLANT
HEMOSTAT SURGICEL 2X14 (HEMOSTASIS) ×3 IMPLANT
INSERT FOGARTY XLG (MISCELLANEOUS) IMPLANT
KIT BASIN OR (CUSTOM PROCEDURE TRAY) ×3 IMPLANT
KIT SUCTION CATH 14FR (SUCTIONS) ×6 IMPLANT
KIT TURNOVER KIT B (KITS) ×3 IMPLANT
KIT VASOVIEW HEMOPRO 2 VH 4000 (KITS) ×3 IMPLANT
MARKER GRAFT CORONARY BYPASS (MISCELLANEOUS) ×9 IMPLANT
NS IRRIG 1000ML POUR BTL (IV SOLUTION) ×15 IMPLANT
PACK E OPEN HEART (SUTURE) ×3 IMPLANT
PACK OPEN HEART (CUSTOM PROCEDURE TRAY) ×3 IMPLANT
PAD ARMBOARD 7.5X6 YLW CONV (MISCELLANEOUS) ×6 IMPLANT
PAD ELECT DEFIB RADIOL ZOLL (MISCELLANEOUS) ×3 IMPLANT
PENCIL BUTTON HOLSTER BLD 10FT (ELECTRODE) ×3 IMPLANT
POSITIONER HEAD DONUT 9IN (MISCELLANEOUS) ×3 IMPLANT
PUNCH AORTIC ROTATE 4.5MM 8IN (MISCELLANEOUS) IMPLANT
SET MPS 3-ND DEL (MISCELLANEOUS) IMPLANT
SHEARS HARMONIC 9CM CVD (BLADE) ×3 IMPLANT
SPONGE T-LAP 18X18 ~~LOC~~+RFID (SPONGE) ×12 IMPLANT
SUPPORT HEART JANKE-BARRON (MISCELLANEOUS) ×3 IMPLANT
SUT BONE WAX W31G (SUTURE) ×3 IMPLANT
SUT MNCRL AB 4-0 PS2 18 (SUTURE) IMPLANT
SUT PROLENE 3 0 SH DA (SUTURE) ×3 IMPLANT
SUT PROLENE 4 0 RB 1 (SUTURE) ×6
SUT PROLENE 4 0 SH DA (SUTURE) IMPLANT
SUT PROLENE 4-0 RB1 .5 CRCL 36 (SUTURE) IMPLANT
SUT PROLENE 6 0 C 1 30 (SUTURE) ×6 IMPLANT
SUT PROLENE 7 0 BV1 MDA (SUTURE) ×3 IMPLANT
SUT PROLENE 8 0 BV175 6 (SUTURE) IMPLANT
SUT STEEL 6MS V (SUTURE) ×3 IMPLANT
SUT STEEL SZ 6 DBL 3X14 BALL (SUTURE) ×3 IMPLANT
SUT VIC AB 1 CTX 36 (SUTURE) ×6
SUT VIC AB 1 CTX36XBRD ANBCTR (SUTURE) ×6 IMPLANT
SUT VIC AB 2-0 CT1 27 (SUTURE) ×6
SUT VIC AB 2-0 CT1 TAPERPNT 27 (SUTURE) IMPLANT
SUT VIC AB 2-0 CTX 27 (SUTURE) IMPLANT
SUT VIC AB 3-0 SH 27 (SUTURE)
SUT VIC AB 3-0 SH 27X BRD (SUTURE) IMPLANT
SUT VIC AB 3-0 X1 27 (SUTURE) IMPLANT
SYSTEM SAHARA CHEST DRAIN ATS (WOUND CARE) ×3 IMPLANT
TAPE CLOTH SURG 4X10 WHT LF (GAUZE/BANDAGES/DRESSINGS) IMPLANT
TAPE PAPER 2X10 WHT MICROPORE (GAUZE/BANDAGES/DRESSINGS) IMPLANT
TOWEL GREEN STERILE (TOWEL DISPOSABLE) ×3 IMPLANT
TOWEL GREEN STERILE FF (TOWEL DISPOSABLE) ×3 IMPLANT
TRAY FOLEY SLVR 16FR TEMP STAT (SET/KITS/TRAYS/PACK) ×3 IMPLANT
TUBING LAP HI FLOW INSUFFLATIO (TUBING) ×3 IMPLANT
UNDERPAD 30X36 HEAVY ABSORB (UNDERPADS AND DIAPERS) ×3 IMPLANT
WATER STERILE IRR 1000ML POUR (IV SOLUTION) ×6 IMPLANT

## 2023-01-02 NOTE — OR Nursing (Signed)
Darryl Nestle, CRNA removed gold colored  band from patient's left ring finger in OR [address].  Ring placed in bag with patient sticker.  Gold band given to 2 heart ICU nurse,Jordon Sherlynn Carbon ,RN when giving report .

## 2023-01-02 NOTE — Op Note (Signed)
NAME: [name], CHICAS MEDICAL RECORD NO: [id number] ACCOUNT NO: [id number] DATE OF BIRTH: 02/17/1961 FACILITY: [facility] LOCATION: [facility] PHYSICIAN: Salvatore Decent. Dorris Fetch, MD  Operative Report   DATE OF PROCEDURE: 01/02/2023  PREPROCEDURE DIAGNOSIS:  Severe 3-vessel coronary artery disease.  POSTOPERATIVE DIAGNOSIS:  Severe 3-vessel coronary artery disease.  PROCEDURES PERFORMED:   Median sternotomy, extracorporeal circulation,  Coronary artery bypass grafting x4  Left internal mammary artery to LAD,  Left radial artery to obtuse marginal 1,  Saphenous vein graft to distal right coronary,  Saphenous vein graft to first diagonal.   Endoscopic vein harvest right thigh,  Open right radial artery harvest.  SURGEON:  Salvatore Decent. Dorris Fetch, MD.  ASSISTANTS:  Aloha Gell, PA and Lowella Dandy, PA.    ANESTHESIA:  General.  FINDINGS:  Transesophageal echocardiography showed ejection fraction of approximately 50% with no significant valvular pathology.  Hypokinesis with initial weaning from bypass, but returned to baseline function shortly thereafter.  Good quality conduits, good quality targets.  CLINICAL NOTE:  [name] is a 62 year old gentleman with multiple cardiac risk factors and known coronary artery disease, who presented with unstable chest pain and ruled in for non-ST elevation MI.  At catheterization, he was found to have severe 3-vessel coronary artery disease.  He was offered coronary artery bypass grafting for treatment.  The indications, risks, benefits, and alternatives were discussed in detail with the patient.  He understood and accepted the risks and agreed to proceed.  PROCEDURE IN DETAIL:  [name] was brought to the preoperative holding area on 01/02/2023.  Anesthesia placed a Swan-Ganz catheter and an arterial blood pressure monitoring line.  He was taken to the operating room and anesthetized and intubated.  A Foley catheter was placed.  Intravenous  antibiotics were administered.  Transesophageal echocardiography was performed by anesthesiology.  Please see their separately dictated note for full details of that procedure.  The chest, abdomen, legs and right arm were prepped and draped in the usual sterile fashion.  A timeout was performed.  The conduits were harvested simultaneously.  A median sternotomy was performed and the left internal mammary artery was harvested.  Simultaneously, Erin Barrett made an incision over the distal volar aspect of the right wrist overlying the radial artery.  A short segment of the radial artery was dissected out.  There was good pulse distally and a good Doppler signal in the palmar arch with radial occlusion.  That incision then was extended to just below the antecubital fossa and the radial artery was harvested using the Harmonic scalpel.  Also, simultaneously, Aloha Gell made an incision on the medial aspect of the leg.  The greater saphenous vein was identified and was harvested endoscopically from the groin.  All the conduits were of good quality.  The leg and arm incisions were closed.  The arm then was tucked back to the patient's side.  Sternal retractor was placed and was gradually opened over time.  The pericardium was opened.  A full dose of heparin was administered.  The ascending aorta was inspected, it was covered in fat but was of normal caliber with no evidence of atherosclerotic disease.  After confirming adequate anticoagulation with ACT measurement, the aorta was cannulated via concentric 2-0 Ethibond pledgeted pursestring sutures.  A dual stage venous cannula was placed via a pursestring suture in right atrial appendage.  Cardiopulmonary bypass was initiated.  Flows were maintained per protocol.  The patient was cooled to 32 degrees Celsius.  The coronary arteries were inspected and  anastomotic sites were chosen.  The conduits were inspected and cut to  length.  A foam pad was placed in the  pericardium to insulate the heart.  A temperature probe was placed in the myocardial septum and a cardioplegia cannula was placed in the ascending aorta.  The aorta was crossclamped.  The left ventricle was emptied via the aortic root vent.  Cardiac arrest then was achieved with a combination of cold antegrade blood cardioplegia and topical iced saline.  One liter of cardioplegia was administered.  There was a rapid diastolic arrest and there was septal cooling to 10 degrees Celsius.  A reversed saphenous vein graft was placed end-to-side to the distal right coronary.  The size of the heart precluded using the radial for the distal right and therefore vein was used.  The vein was of good quality.  It was anastomosed end-to-side with a running 7-0 Prolene suture.  The distal right coronary was 2 mm, good quality target vessel.  All anastomoses were probed proximally and distally at their completion.  Cardioplegia was administered down the graft and there was good flow and good hemostasis.  Next, a reversed saphenous vein graft was placed end-to-side to the first diagonal branch of the LAD.  This was a 1.5 mm good quality target vessel.  The vein was of good quality, was anastomosed with a running 7-0 Prolene suture.  Again, the probe passed easily and cardioplegia was administered with good flow and good hemostasis.  Next, the distal end of the radial artery was bevelled.  It was anastomosed end-to-side to OM1.  This was a 1.5 mm good quality target.  The radial was of good quality.  It was anastomosed end-to-side with a running 8-0 Prolene suture.  The probe passed easily, cardioplegia was administered down the graft and there was good flow and good hemostasis.  Additional cardioplegia was administered down the aortic root.  The left internal mammary artery then was brought through an incision in the pericardium.  The distal end was bevelled.  It was anastomosed end-to-side to the distal LAD with a running  8-0 Prolene suture.  At the completion of the mammary to LAD anastomosis, the bulldog clamp was briefly removed.  Septal rewarming was noted.  The bulldog clamp was replaced and the mammary pedicle was tacked to the epicardial surface of the heart with 6-0 Prolene sutures.  Additional cardioplegia was administered down the grafts.  The veins were cut to length and the proximal vein graft anastomoses were performed to 4.5 mm punch aortotomies with a running 6-0 Prolene suture.  At the completion of the final vein graft proximal, the patient was placed in Trendelenburg position.  Lidocaine was administered.  The aortic root was de-aired and the aortic crossclamp was removed.  The total crossclamp time was 83 minutes.  The patient did not require defibrillation.  After inspecting the proximal anastomoses for hemostasis, bulldog clamps were placed proximally and distally on the diagonal vein and a longitudinal venotomy was made.  The proximal anastomosis for the radial artery was sewn  end-to-side to the vein graft.  This was done because there was not adequate space on the ascending aorta for 3 separate proximals.  This anastomosis was done with a running 7-0 Prolene suture and was de-aired from distal before opening the clamp proximally.  While rewarming was completed, all proximal and distal anastomoses were inspected for hemostasis.  There was some bleeding from the heel at the radial artery graft, which was repaired with  8-0 Prolene suture.  Epicardial pacing wires were placed on the  right ventricle and right atrium.  When the patient rewarmed to a core temperature of 37 degrees Celsius, he was weaned from cardiopulmonary bypass on the first attempt.  He was atrially paced.  He was in sinus rhythm and on no inotropic support.  Total bypass time was 162 minutes.  A test dose protamine was administered and was well tolerated.  The atrial and aortic cannulae were removed.  The remainder of the protamine was  administered without incident.  Cardiac output could not be obtained due to issues with a Swan-Ganz catheter.  The pericardium was not approximated.  The chest was copiously irrigated with warm saline.  Hemostasis was achieved.  Left pleural and mediastinal chest tubes were placed through separate subcostal incisions.  The sternum was closed with a combination of single and double heavy gauge stainless steel wires.  The pectoralis fascia, subcutaneous tissue and skin were closed in standard fashion.  All sponge, needle and instrument counts were correct at the end of the procedure.  The patient was taken from the operating room to the surgical intensive care unit, intubated and in good condition.  Experienced assistance was necessary for this case due to surgical complexity.  As noted, Erin Barrett harvested the radial artery and closed the arm incision.  Zariah Stehler harvested the vein endoscopically and closed the leg incisions.  She then assisted with retraction of delicate tissues, exposure, suture management and suctioning during the distal anastomoses.   CHR D: 01/02/2023 9:29:38 pm T: 01/02/2023 10:24:00 pm  JOB: [id number]/ [id number]

## 2023-01-02 NOTE — Anesthesia Procedure Notes (Signed)
Procedure Name: Intubation Date/Time: 01/02/2023 8:20 AM  Performed by: Darryl Nestle, CRNAPre-anesthesia Checklist: Patient identified, Emergency Drugs available, Suction available and Patient being monitored Patient Re-evaluated:Patient Re-evaluated prior to induction Oxygen Delivery Method: Circle system utilized Preoxygenation: Pre-oxygenation with 100% oxygen Induction Type: IV induction Ventilation: Mask ventilation without difficulty Laryngoscope Size: Mac, 3 and Glidescope Grade View: Grade I Tube type: Oral Tube size: 8.0 mm Number of attempts: 1 Airway Equipment and Method: Stylet and Oral airway Placement Confirmation: ETT inserted through vocal cords under direct vision, positive ETCO2 and breath sounds checked- equal and bilateral Secured at: 23 cm Tube secured with: Tape Dental Injury: Teeth and Oropharynx as per pre-operative assessment

## 2023-01-02 NOTE — Discharge Instructions (Addendum)
Discharge Instructions:  1. You may shower, please wash incisions daily with soap and water and keep dry.  If you wish to cover wounds with dressing you may do so but please keep clean and change daily.  No tub baths or swimming until incisions have completely healed.  If your incisions become red or develop any drainage please call our office at [phone]  2. No Driving until cleared by Dr. Hendrickson's office and you are no longer using narcotic pain medications  3. Monitor your weight daily.. Please use the same scale and weigh at same time... If you gain 5-10 lbs in 48 hours with associated lower extremity swelling, please contact our office at [phone]  4. Fever of 101.5 for at least 24 hours with no source, please contact our office at [phone]  5. Activity- up as tolerated, please walk at least 3 times per day.  Avoid strenuous activity, no lifting, pushing, or pulling with your arms over 8-10 lbs for a minimum of 6 weeks  6. If any questions or concerns arise, please do not hesitate to contact our office at [phone]   Information on my medicine - ELIQUIS (apixaban)  Why was Eliquis prescribed for you? Eliquis was prescribed for you to reduce the risk of a blood clot forming that can cause a stroke if you have a medical condition called atrial fibrillation (a type of irregular heartbeat).  What do You need to know about Eliquis ? Take your Eliquis TWICE DAILY - one tablet in the morning and one tablet in the evening with or without food. If you have difficulty swallowing the tablet whole please discuss with your pharmacist how to take the medication safely.  Take Eliquis exactly as prescribed by your doctor and DO NOT stop taking Eliquis without talking to the doctor who prescribed the medication.  Stopping may increase your risk of developing a stroke.  Refill your prescription before you run out.  After discharge, you should have regular check-up appointments  with your healthcare provider that is prescribing your Eliquis.  In the future your dose may need to be changed if your kidney function or weight changes by a significant amount or as you get older.  What do you do if you miss a dose? If you miss a dose, take it as soon as you remember on the same day and resume taking twice daily.  Do not take more than one dose of ELIQUIS at the same time to make up a missed dose.  Important Safety Information A possible side effect of Eliquis is bleeding. You should call your healthcare provider right away if you experience any of the following: Bleeding from an injury or your nose that does not stop. Unusual colored urine (red or dark brown) or unusual colored stools (red or black). Unusual bruising for unknown reasons. A serious fall or if you hit your head (even if there is no bleeding).  Some medicines may interact with Eliquis and might increase your risk of bleeding or clotting while on Eliquis. To help avoid this, consult your healthcare provider or pharmacist prior to using any new prescription or non-prescription medications, including herbals, vitamins, non-steroidal anti-inflammatory drugs (NSAIDs) and supplements.  This website has more information on Eliquis (apixaban): http://www.eliquis.com/eliquis/home   

## 2023-01-02 NOTE — Progress Notes (Signed)
EVENING ROUNDS NOTE :     [address]       [place] [address]             [phone]                 Day of Surgery Procedure(s) (LRB): CORONARY ARTERY BYPASS GRAFTING (CABG) (N/A) RADIAL ARTERY HARVEST (Right) TRANSESOPHAGEAL ECHOCARDIOGRAM (N/A)   Total Length of Stay:  LOS: 4 days  Events:   No events Extubated     BP 122/77   Pulse 80   Temp 97.8 F (36.6 C) (Oral)   Resp (!) 24   Ht 6' (1.829 m)   Wt 124.2 kg   SpO2 94%   BMI 37.14 kg/m   PAP: (19-38)/(12-27) 38/15  Vent Mode: PSV;CPAP FiO2 (%):  [40 %-50 %] 40 % Set Rate:  [4 bmp-12 bmp] 4 bmp Vt Set:  [914 mL] 620 mL Pressure Support:  [5 cmH20] 5 cmH20 Plateau Pressure:  [22 cmH20] 22 cmH20   sodium chloride     sodium chloride     [START ON 01/03/2023] sodium chloride     sodium chloride Stopped (01/02/23 1757)   albumin human      ceFAZolin (ANCEF) IV Stopped (01/02/23 1742)   dexmedetomidine (PRECEDEX) IV infusion Stopped (01/02/23 1632)   insulin 3.2 Units/hr (01/02/23 1800)   lactated ringers 20 mL/hr at 01/02/23 1800   lactated ringers     magnesium sulfate 20 mL/hr at 01/02/23 1800   nitroGLYCERIN 7 mcg/min (01/02/23 1800)   phenylephrine (NEO-SYNEPHRINE) Adult infusion Stopped (01/02/23 1431)   vancomycin      I/O last 3 completed shifts: In: 4403.9 [P.O.:480; I.V.:2623.8; Blood:400; IV Piggyback:900.1] Out: 3497 [Urine:2780; Blood:600; Chest Tube:117]      Latest Ref Rng & Units 01/02/2023    2:55 PM 01/02/2023    2:53 PM 01/02/2023    1:39 PM  CBC  WBC 4.0 - 10.5 K/uL 8.9     Hemoglobin 13.0 - 17.0 g/dL 78.2  95.6  21.3   Hematocrit 39.0 - 52.0 % 38.2  35.0  33.0   Platelets 150 - 400 K/uL 96          Latest Ref Rng & Units 01/02/2023    2:53 PM 01/02/2023    1:39 PM 01/02/2023    1:35 PM  BMP  Glucose 70 - 99 mg/dL  086    BUN 6 - 20 mg/dL  16    Creatinine 5.78 - 1.24 mg/dL  4.69    Sodium 629 - 528 mmol/L 141  140  141   Potassium 3.5 - 5.1 mmol/L  4.2  4.2  4.2   Chloride 98 - 111 mmol/L  104      ABG    Component Value Date/Time   PHART 7.255 (L) 01/02/2023 1453   PCO2ART 58.1 (H) 01/02/2023 1453   PO2ART 84 01/02/2023 1453   HCO3 25.9 01/02/2023 1453   TCO2 28 01/02/2023 1453   ACIDBASEDEF 2.0 01/02/2023 1453   O2SAT 64.9 01/02/2023 1457       Brynda Greathouse, MD 01/02/2023 7:20 PM

## 2023-01-02 NOTE — Procedures (Signed)
Extubation Procedure Note  Patient Details:   Name: [name] DOB: 1961/04/26 MRN: [id number]   Airway Documentation:    Vent end date: 01/02/23 Vent end time: 1746   Evaluation  O2 sats: stable throughout Complications: No apparent complications Patient did tolerate procedure well. Bilateral Breath Sounds: Clear   Yes  Pt extubated per rapid weaning protocol. Placed on 3L [place]. Positive cuff leak noted, no stridor heard. NIF > -20. VC 0.39ml. RT will continue to monitor.    Vicente Masson 01/02/2023, 5:51 PM

## 2023-01-02 NOTE — Progress Notes (Signed)
Short stay called for report on patient for CABG.  Report given.  Waiting for OR transport.

## 2023-01-02 NOTE — Anesthesia Preprocedure Evaluation (Addendum)
Anesthesia Evaluation  Patient identified by MRN, date of birth, ID band Patient awake    Reviewed: Allergy & Precautions, NPO status , Patient's Chart, lab work & pertinent test results  History of Anesthesia Complications Negative for: history of anesthetic complications  Airway Mallampati: IV  TM Distance: >3 FB Neck ROM: Full    Dental  (+) Dental Advisory Given   Pulmonary sleep apnea , COPD, Current Smoker   breath sounds clear to auscultation       Cardiovascular hypertension, + angina  + CAD and + Past MI   Rhythm:Regular     Neuro/Psych Seizures -,  PSYCHIATRIC DISORDERS Anxiety      Neuromuscular disease    GI/Hepatic ,GERD  ,,(+) Hepatitis -  Endo/Other  diabetes    Renal/GU Renal disease     Musculoskeletal negative musculoskeletal ROS (+)    Abdominal   Peds  Hematology negative hematology ROS (+)   Anesthesia Other Findings   Reproductive/Obstetrics                             Anesthesia Physical Anesthesia Plan  ASA: 4  Anesthesia Plan: General   Post-op Pain Management:    Induction: Intravenous  PONV Risk Score and Plan: 1 and Treatment may vary due to age or medical condition  Airway Management Planned: Oral ETT  Additional Equipment: Arterial line, PA Cath, CVP, TEE and Ultrasound Guidance Line Placement  Intra-op Plan:   Post-operative Plan: Post-operative intubation/ventilation  Informed Consent: I have reviewed the patients History and Physical, chart, labs and discussed the procedure including the risks, benefits and alternatives for the proposed anesthesia with the patient or authorized representative who has indicated his/her understanding and acceptance.     Dental advisory given  Plan Discussed with: CRNA  Anesthesia Plan Comments:        Anesthesia Quick Evaluation

## 2023-01-02 NOTE — Anesthesia Procedure Notes (Addendum)
Central Venous Catheter Insertion Performed by: Val Eagle, MD, anesthesiologist Start/End8/22/2024 7:00 AM, 01/02/2023 7:15 AM Patient location: Pre-op. Preanesthetic checklist: patient identified, IV checked, site marked, risks and benefits discussed, surgical consent, monitors and equipment checked, pre-op evaluation, timeout performed and anesthesia consent Position: supine Patient sedated Hand hygiene performed  and maximum sterile barriers used  Catheter size: 9 Fr Total catheter length 10. Central line was placed.MAC introducer Procedure performed using ultrasound guided technique. Ultrasound Notes:anatomy identified, needle tip was noted to be adjacent to the nerve/plexus identified, no ultrasound evidence of intravascular and/or intraneural injection and image(s) printed for medical record Attempts: 1 Following insertion, dressing applied. Patient tolerated the procedure well with no immediate complications.

## 2023-01-02 NOTE — Anesthesia Procedure Notes (Signed)
Central Venous Catheter Insertion Performed by: Val Eagle, MD, anesthesiologist Start/End8/22/2024 7:11 AM, 01/02/2023 7:20 AM Patient location: Pre-op. Preanesthetic checklist: patient identified, IV checked, site marked, risks and benefits discussed, surgical consent, monitors and equipment checked, pre-op evaluation, timeout performed and anesthesia consent Hand hygiene performed  and maximum sterile barriers used  PA cath was placed.Swan type:thermodilution Procedure performed without using ultrasound guided technique. Attempts: 1 Patient tolerated the procedure well with no immediate complications.

## 2023-01-02 NOTE — Progress Notes (Signed)
PROGRESS NOTE  [name]  NWG:[id number] DOB: 1960/12/30 DOA: 12/28/2022 PCP: Nathan Garter, MD   Brief Narrative: Patient is a 62 year old male with history of hypertension, hyperlipidemia, GERD, diabetes type 2, COPD, epilepsy, chronic hep C, obesity, OSA, CAD/MI, cocaine use, anxiety, low back pain who presented with chest pain.  Admitted for the management of NSTEMI, cardiology following.  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  Cardiothoracic surgery following ,plan for CABG.  Assessment & Plan:  Principal Problem:   NSTEMI (non-ST elevated myocardial infarction) [facility]) Active Problems:   Morbid obesity (HCC)   Anxiety disorder   Epilepsy (HCC)   HTN (hypertension)   MYOCARDIAL INFARCTION, HX OF   GERD   Low back pain radiating to left lower extremity   COPD mixed type (HCC)   OSA (obstructive sleep apnea)   CAD (coronary artery disease)   Cocaine abuse (HCC)   Pure hypercholesterolemia   NSTEMI: Presented with chest pain.  Underwent left heart cath on 8/19 which showed severe multivessel disease.  Currently on heparin drip.  Cardiothoracic surgery following,plan for  CABG Continue heparin drip, Lipitor, Lopressor.  History of hyperlipidemia: On Lipitor  History of hypertension: On Imdur, Lopressor.  History of tobacco use: Smoked 2 packs a day.  Continue nicotine patch.  Counseled cessation.  Diabetes type 2: A1c of 6.6.  Currently not on any medication at home.  COPD: Currently stable.  Not on exacerbation  Childhood epilepsy: Continue seizure medications  Obesity: BMI of 36.8.  OSA: Continue CPAP therapy        DVT prophylaxis:Heparin drip     Code Status: Full Code  Family Communication: None at bedside  Patient status:Inpatient  Patient is from :Home  Anticipated discharge VH:QION  Estimated DC date:after cardiology/cardiothoracic surgery clearance   Consultants: Cardiology, cardiothoracic surgery  Procedures:  Cardiac cath  Antimicrobials:  Anti-infectives (From admission, onward)    Start     Dose/Rate Route Frequency Ordered Stop   01/02/23 0400  vancomycin (VANCOREADY) IVPB 1500 mg/300 mL        1,500 mg 150 mL/hr over 120 Minutes Intravenous To Surgery 01/01/23 1353 01/02/23 0825   01/02/23 0400  ceFAZolin (ANCEF) IVPB 2g/100 mL premix        2 g 200 mL/hr over 30 Minutes Intravenous To Surgery 01/01/23 1353 01/03/23 0400   01/02/23 0400  ceFAZolin (ANCEF) IVPB 3g/100 mL premix        3 g 200 mL/hr over 30 Minutes Intravenous To Surgery 01/01/23 1353 01/02/23 0825       Subjective: Patient went to the OR earlier today.  Could not be seen or  examined   Objective: Vitals:   01/01/23 2309 01/02/23 0348 01/02/23 0434 01/02/23 0649  BP: 133/65  134/86 (!) 136/91  Pulse: 61  64 69  Resp:  19 20 19   Temp: 97.6 F (36.4 C)  97.6 F (36.4 C) 98.1 F (36.7 C)  TempSrc: Axillary  Oral Oral  SpO2: 94%  92%   Weight:   124.2 kg   Height:   6' (1.829 m)     Intake/Output Summary (Last 24 hours) at 01/02/2023 1142 Last data filed at 01/02/2023 1127 Gross per 24 hour  Intake 2154.93 ml  Output 1350 ml  Net 804.93 ml   Filed Weights   12/28/22 0602 12/28/22 1600 01/02/23 0434  Weight: 127 kg 123.2 kg 124.2 kg      Data Reviewed: I have personally reviewed following labs and imaging studies  CBC:  Recent Labs  Lab 12/29/22 0336 12/30/22 0125 12/31/22 0209 01/01/23 0351 01/02/23 0419 01/02/23 0828 01/02/23 1009 01/02/23 1040 01/02/23 1043 01/02/23 1106  WBC 6.3 5.5 5.7 5.5 6.7  --   --   --   --   --   HGB 16.2 15.7 15.3 15.7 16.7 14.6 12.6* 10.9* 11.2* 11.2*  HCT 47.7 45.1 44.7 45.9 48.8 43.0 37.0* 32.0* 33.0* 33.0*  MCV 92.1 91.7 89.4 89.8 90.0  --   --   --   --   --   PLT 135* 125* 137* 138* 140*  --   --   --   --   --    Basic Metabolic Panel: Recent Labs  Lab 12/28/22 0612 12/29/22 0336 01/02/23 0419 01/02/23 0828 01/02/23 1009 01/02/23 1040  01/02/23 1043 01/02/23 1106  NA 140 140 139 141 141 139 141 138  K 4.1 3.9 4.1 3.9 3.8 4.3 4.0 5.1  CL 105 104 104 103 106  --   --  104  CO2 25 24 25   --   --   --   --   --   GLUCOSE 186* 159* 130* 118* 131*  --   --  136*  BUN 15 15 16 17 15   --   --  15  CREATININE 0.77 0.79 0.83 0.70 0.70  --   --  0.60*  CALCIUM 8.9 8.8* 9.3  --   --   --   --   --      Recent Results (from the past 240 hour(s))  MRSA Next Gen by PCR, Nasal     Status: None   Collection Time: 01/01/23 11:24 PM   Specimen: Nasal Mucosa; Nasal Swab  Result Value Ref Range Status   MRSA by PCR Next Gen NOT DETECTED NOT DETECTED Final    Comment: (NOTE) The GeneXpert MRSA Assay (FDA approved for NASAL specimens only), is one component of a comprehensive MRSA colonization surveillance program. It is not intended to diagnose MRSA infection nor to guide or monitor treatment for MRSA infections. Test performance is not FDA approved in patients less than 61 years old. Performed at [facility], [address] [place], [place] [address]   SARS Coronavirus 2 by RT PCR (hospital order, performed in [facility] hospital lab) *cepheid single result test* Anterior Nasal Swab     Status: None   Collection Time: 01/02/23  6:59 AM   Specimen: Anterior Nasal Swab  Result Value Ref Range Status   SARS Coronavirus 2 by RT PCR NEGATIVE NEGATIVE Final    Comment: Performed at [facility], [address], [place], [place] [address]     Radiology Studies: VAS US DOPPLER PRE CABG  Result Date: 01/01/2023 PREOPERATIVE VASCULAR EVALUATION [name]:   01/01/2023 Medical Rec #: [id number]                  Accession #:    [id number] Date of Birth: 1960-10-28                 Patient Gender: M Patient Age:   75 years Exam Location:  [facility] Procedure:      VAS US DOPPLER PRE CABG Referring Phys: [name]  --------------------------------------------------------------------------------  Indications:      Pre-CABG. Risk Factors:     Hypertension, hyperlipidemia, Diabetes, current smoker, prior  MI, coronary artery disease. Comparison Study: No previous studies Performing Technologist: McKayla Maag RVT, [place]  Examination Guidelines: A complete evaluation includes B-mode imaging, spectral Doppler, color Doppler, and power Doppler as needed of all accessible portions of each vessel. Bilateral testing is considered an integral part of a complete examination. Limited examinations for reoccurring indications may be performed as noted.  Right Carotid Findings: +----------+-------+-------+--------+------------------------+-----------------+           PSV    EDV    StenosisDescribe                Comments                    cm/s   cm/s                                                     +----------+-------+-------+--------+------------------------+-----------------+ CCA Prox  79     11                                     intimal                                                                   thickening        +----------+-------+-------+--------+------------------------+-----------------+ CCA Distal58     16                                                       +----------+-------+-------+--------+------------------------+-----------------+ ICA Prox  40     13     1-39%   irregular and                                                             heterogenous                              +----------+-------+-------+--------+------------------------+-----------------+ ICA Distal56     21                                                       +----------+-------+-------+--------+------------------------+-----------------+ ECA       70     7                                                         +----------+-------+-------+--------+------------------------+-----------------+ +----------+--------+-------+----------------+------------+  PSV cm/sEDV cmsDescribe        Arm Pressure +----------+--------+-------+----------------+------------+ [id number]            Multiphasic, WNL             +----------+--------+-------+----------------+------------+ +---------+--------+--+--------+--+---------+ VertebralPSV cm/s31EDV cm/s13Antegrade +---------+--------+--+--------+--+---------+ Left Carotid Findings: +----------+-------+-------+--------+------------------------+-----------------+           PSV    EDV    StenosisDescribe                Comments                    cm/s   cm/s                                                     +----------+-------+-------+--------+------------------------+-----------------+ CCA Prox  86     16                                                       +----------+-------+-------+--------+------------------------+-----------------+ CCA Distal57     13                                     intimal                                                                   thickening        +----------+-------+-------+--------+------------------------+-----------------+ ICA Prox  48     20     1-39%   irregular and                                                             heterogenous                              +----------+-------+-------+--------+------------------------+-----------------+ ICA Distal64     21                                                       +----------+-------+-------+--------+------------------------+-----------------+ ECA       103    9                                                        +----------+-------+-------+--------+------------------------+-----------------+  +----------+--------+--------+----------------+------------+ SubclavianPSV cm/sEDV cm/sDescribe         Arm Pressure +----------+--------+--------+----------------+------------+  145             Multiphasic, WNL             +----------+--------+--------+----------------+------------+ +---------+--------+--+--------+--+---------+ VertebralPSV cm/s33EDV cm/s13Antegrade +---------+--------+--+--------+--+---------+  ABI Findings: +---------+------------------+-----+---------+--------+ Right    Rt Pressure (mmHg)IndexWaveform Comment  +---------+------------------+-----+---------+--------+ Brachial 142                    triphasic         +---------+------------------+-----+---------+--------+ PTA      161               1.13 triphasic         +---------+------------------+-----+---------+--------+ DP       150               1.06 triphasic         +---------+------------------+-----+---------+--------+ Daleen Squibb               0.96 Normal            +---------+------------------+-----+---------+--------+ +---------+------------------+-----+---------+-------+ Left     Lt Pressure (mmHg)IndexWaveform Comment +---------+------------------+-----+---------+-------+ Brachial 140                    triphasic        +---------+------------------+-----+---------+-------+ PTA      175               1.23 triphasic        +---------+------------------+-----+---------+-------+ DP       177               1.25 triphasic        +---------+------------------+-----+---------+-------+ Great Toe168               1.18 Normal           +---------+------------------+-----+---------+-------+ +-------+---------------+----------------+ ABI/TBIToday's ABI/TBIPrevious ABI/TBI +-------+---------------+----------------+ Right  1.13/ 0.96                      +-------+---------------+----------------+ Left   1.25/ 1.18                      +-------+---------------+----------------+  Right Doppler Findings: +--------+--------+-----+---------+--------+ Site     PressureIndexDoppler  Comments +--------+--------+-----+---------+--------+ [id number]          triphasic         +--------+--------+-----+---------+--------+ Radial               triphasic         +--------+--------+-----+---------+--------+ Ulnar                triphasic         +--------+--------+-----+---------+--------+  Left Doppler Findings: +--------+--------+-----+---------+--------+ Site    PressureIndexDoppler  Comments +--------+--------+-----+---------+--------+ [id number]          triphasic         +--------+--------+-----+---------+--------+ Radial               triphasic         +--------+--------+-----+---------+--------+ Ulnar                triphasic         +--------+--------+-----+---------+--------+   Summary: Right Carotid: Velocities in the right ICA are consistent with a 1-39% stenosis. Left Carotid: Velocities in the left ICA are consistent with a 1-39% stenosis. Vertebrals:  Bilateral vertebral arteries demonstrate antegrade flow. Subclavians: Normal flow hemodynamics were seen in bilateral subclavian              arteries. Right ABI: Resting right ankle-brachial index is  within normal range. The right toe-brachial index is normal. Left ABI: Resting left ankle-brachial index is within normal range. The left toe-brachial index is normal. Right Upper Extremity: Doppler waveforms decrease <50% with right radial compression. Doppler waveform obliterate with right ulnar compression. Left Upper Extremity: Doppler waveforms decrease <50% w left radial compression. Doppler waveforms decrease 50% with left ulnar compression.  Electronically signed by Heath Lark on 01/01/2023 at 8:51:33 PM.    Final     Scheduled Meds:  [MAR Hold] aspirin EC  81 mg Oral Daily   [MAR Hold] atorvastatin  80 mg Oral Daily   epinephrine  0-10 mcg/min Intravenous To OR   heparin sodium (porcine) 2,500 Units, papaverine 30 mg in electrolyte-A (PLASMALYTE-A PH 7.4)  500 mL irrigation   Irrigation To OR   [MAR Hold] isosorbide mononitrate  30 mg Oral Daily   magnesium sulfate  40 mEq Other To OR   [MAR Hold] metoprolol tartrate  37.5 mg Oral Daily   [MAR Hold] mometasone-formoterol  2 puff Inhalation BID   [MAR Hold] nicotine  21 mg Transdermal Daily   [MAR Hold] polyethylene glycol  17 g Oral BID   potassium chloride  80 mEq Other To OR   [MAR Hold] sodium chloride flush  3 mL Intravenous Q12H   tranexamic acid  2 mg/kg Intracatheter To OR   Continuous Infusions:  [MAR Hold] sodium chloride      ceFAZolin (ANCEF) IV     heparin 30,000 units/NS 1000 mL solution for CELLSAVER     heparin 2,100 Units/hr (01/02/23 0558)   milrinone     norepinephrine       LOS: 4 days   Burnadette Pop, MD [facility] P8/22/2024, 11:42 AM

## 2023-01-02 NOTE — Transfer of Care (Signed)
Immediate Anesthesia Transfer of Care Note  Patient: [name]  Procedure(s) Performed: CORONARY ARTERY BYPASS GRAFTING (CABG) (Chest) RADIAL ARTERY HARVEST (Right) TRANSESOPHAGEAL ECHOCARDIOGRAM  Patient Location: SICU  Anesthesia Type:General  Level of Consciousness: Patient remains intubated per anesthesia plan  Airway & Oxygen Therapy: Patient remains intubated per anesthesia plan  Post-op Assessment: Report given to RN and Post -op Vital signs reviewed and stable  Post vital signs: Reviewed and stable  Last Vitals:  Vitals Value Taken Time  BP 105/62   Temp 98   Pulse 82 01/02/23 1436  Resp 14 01/02/23 1436  SpO2 94 % 01/02/23 1436  Vitals shown include unfiled device data.  Last Pain:  Vitals:   01/02/23 0658  TempSrc:   PainSc: 0-No pain      Patients Stated Pain Goal: 0 (12/31/22 2000)  Complications: No notable events documented.

## 2023-01-02 NOTE — Anesthesia Procedure Notes (Addendum)
Arterial Line Insertion Start/End8/22/2024 7:00 AM, 01/02/2023 7:10 AM Performed by: Darryl Nestle, CRNA, CRNA  Patient location: Pre-op. Preanesthetic checklist: patient identified, IV checked, site marked, risks and benefits discussed, surgical consent, monitors and equipment checked, pre-op evaluation, timeout performed and anesthesia consent Lidocaine 1% used for infiltration Left, radial was placed Catheter size: 20 G Hand hygiene performed  and maximum sterile barriers used   Attempts: 3 Procedure performed using ultrasound guided technique. Ultrasound Notes:anatomy identified, needle tip was noted to be adjacent to the nerve/plexus identified and no ultrasound evidence of intravascular and/or intraneural injection Following insertion, dressing applied and Biopatch. Post procedure assessment: normal and unchanged  Post procedure complications: unsuccessful attempts. Patient tolerated the procedure well with no immediate complications.

## 2023-01-02 NOTE — Progress Notes (Signed)
Rounding Note    Patient Name: [name] Date of Encounter: 01/02/2023  [facility] Cardiologist: Norman Herrlich, MD   Subjective   No acute events overnight; no complaints.  BP better controlled.  To OR this AM.     Inpatient Medications    Scheduled Meds:  aspirin EC  81 mg Oral Daily   atorvastatin  80 mg Oral Daily   epinephrine  0-10 mcg/min Intravenous To OR   heparin sodium (porcine) 2,500 Units, papaverine 30 mg in electrolyte-A (PLASMALYTE-A PH 7.4) 500 mL irrigation   Irrigation To OR   insulin   Intravenous To OR   isosorbide mononitrate  30 mg Oral Daily   magnesium sulfate  40 mEq Other To OR   metoprolol tartrate  37.5 mg Oral Daily   mometasone-formoterol  2 puff Inhalation BID   nicotine  21 mg Transdermal Daily   phenylephrine  30-200 mcg/min Intravenous To OR   polyethylene glycol  17 g Oral BID   potassium chloride  80 mEq Other To OR   sodium chloride flush  3 mL Intravenous Q12H   tranexamic acid  15 mg/kg Intravenous To OR   tranexamic acid  2 mg/kg Intracatheter To OR   Continuous Infusions:  sodium chloride      ceFAZolin (ANCEF) IV      ceFAZolin (ANCEF) IV     dexmedetomidine     heparin 30,000 units/NS 1000 mL solution for CELLSAVER     heparin 2,100 Units/hr (01/02/23 0558)   milrinone     nitroGLYCERIN     norepinephrine     tranexamic acid (CYKLOKAPRON) 2,500 mg in sodium chloride 0.9 % 250 mL (10 mg/mL) infusion     vancomycin     PRN Meds: sodium chloride, acetaminophen, naphazoline-pheniramine, nitroGLYCERIN, ondansetron (ZOFRAN) IV, sodium chloride flush   Vital Signs    Vitals:   01/01/23 1953 01/01/23 2309 01/02/23 0348 01/02/23 0434  BP: 126/71 133/65  134/86  Pulse: 68 61  64  Resp: 20  19 20   Temp: 98 F (36.7 C) 97.6 F (36.4 C)  97.6 F (36.4 C)  TempSrc: Oral Axillary  Oral  SpO2: 93% 94%  92%  Weight:    124.2 kg  Height:    6' (1.829 m)    Intake/Output Summary (Last 24 hours)  at 01/02/2023 0615 Last data filed at 01/02/2023 0558 Gross per 24 hour  Intake 1333.39 ml  Output 600 ml  Net 733.39 ml      01/02/2023    4:34 AM 12/28/2022    4:00 PM 12/28/2022    6:02 AM  Last 3 Weights  Weight (lbs) 273 lb 13 oz 271 lb 9.7 oz 280 lb  Weight (kg) 124.2 kg 123.2 kg 127.007 kg      Telemetry    Sinus rhythm - Personally Reviewed   Physical Exam  Alert, oriented, no distress GEN: No acute distress.   Neck: No JVD Cardiac: RRR, no murmurs, rubs, or gallops.  Respiratory: Clear to auscultation bilaterally. GI: Soft, nontender, non-distended  MS: No edema; No deformity.  Right radial cath site clear. Neuro:  Nonfocal  Psych: Normal affect   Labs    High Sensitivity Troponin:   Recent Labs  Lab 12/28/22 0612 12/28/22 0747 12/28/22 1417  TROPONINIHS 16 76* 1,078*     Chemistry Recent Labs  Lab 12/28/22 0612 12/29/22 0336 01/02/23 0419  NA 140 140 139  K 4.1 3.9 4.1  CL 105 104 104  CO2 25 24 25   GLUCOSE 186* 159* 130*  BUN 15 15 16   CREATININE 0.77 0.79 0.83  CALCIUM 8.9 8.8* 9.3  PROT  --   --  7.0  ALBUMIN  --   --  4.1  AST  --   --  82*  ALT  --   --  144*  ALKPHOS  --   --  54  BILITOT  --   --  1.6*  GFRNONAA >60 >60 >60  ANIONGAP 10 12 10     Lipids  Recent Labs  Lab 12/29/22 0336  CHOL 153  TRIG 180*  HDL 33*  LDLCALC 84  CHOLHDL 4.6    Hematology Recent Labs  Lab 12/31/22 0209 01/01/23 0351 01/02/23 0419  WBC 5.7 5.5 6.7  RBC 5.00 5.11 5.42  HGB 15.3 15.7 16.7  HCT 44.7 45.9 48.8  MCV 89.4 89.8 90.0  MCH 30.6 30.7 30.8  MCHC 34.2 34.2 34.2  RDW 12.6 12.6 12.6  PLT 137* 138* 140*   Thyroid No results for input(s): "TSH", "FREET4" in the last 168 hours.  BNP Recent Labs  Lab 12/28/22 0612  BNP 32.3    DDimer No results for input(s): "DDIMER" in the last 168 hours.   Radiology    VAS US DOPPLER PRE CABG  Result Date: 01/01/2023 PREOPERATIVE VASCULAR EVALUATION Patient Name:  NELTON NIP  Date of Exam:   01/01/2023 Medical Rec #: [id number]                  Accession #:    [id number] Date of Birth: 01-24-1961                 Patient Gender: M Patient Age:   75 years Exam Location:  [facility] Procedure:      VAS US DOPPLER PRE CABG Referring Phys: Viviann Spare HENDRICKSON --------------------------------------------------------------------------------  Indications:      Pre-CABG. Risk Factors:     Hypertension, hyperlipidemia, Diabetes, current smoker, prior                   MI, coronary artery disease. Comparison Study: No previous studies Performing Technologist: McKayla Maag RVT, [place]  Examination Guidelines: A complete evaluation includes B-mode imaging, spectral Doppler, color Doppler, and power Doppler as needed of all accessible portions of each vessel. Bilateral testing is considered an integral part of a complete examination. Limited examinations for reoccurring indications may be performed as noted.  Right Carotid Findings: +----------+-------+-------+--------+------------------------+-----------------+           PSV    EDV    StenosisDescribe                Comments                    cm/s   cm/s                                                     +----------+-------+-------+--------+------------------------+-----------------+ CCA Prox  79     11                                     intimal  thickening        +----------+-------+-------+--------+------------------------+-----------------+ CCA Distal58     16                                                       +----------+-------+-------+--------+------------------------+-----------------+ ICA Prox  40     13     1-39%   irregular and                                                             heterogenous                              +----------+-------+-------+--------+------------------------+-----------------+  ICA Distal56     21                                                       +----------+-------+-------+--------+------------------------+-----------------+ ECA       70     7                                                        +----------+-------+-------+--------+------------------------+-----------------+ +----------+--------+-------+----------------+------------+           PSV cm/sEDV cmsDescribe        Arm Pressure +----------+--------+-------+----------------+------------+ [id number]            Multiphasic, WNL             +----------+--------+-------+----------------+------------+ +---------+--------+--+--------+--+---------+ VertebralPSV cm/s31EDV cm/s13Antegrade +---------+--------+--+--------+--+---------+ Left Carotid Findings: +----------+-------+-------+--------+------------------------+-----------------+           PSV    EDV    StenosisDescribe                Comments                    cm/s   cm/s                                                     +----------+-------+-------+--------+------------------------+-----------------+ CCA Prox  86     16                                                       +----------+-------+-------+--------+------------------------+-----------------+ CCA Distal57     13                                     intimal  thickening        +----------+-------+-------+--------+------------------------+-----------------+ ICA Prox  48     20     1-39%   irregular and                                                             heterogenous                              +----------+-------+-------+--------+------------------------+-----------------+ ICA Distal64     21                                                       +----------+-------+-------+--------+------------------------+-----------------+ ECA       103    9                                                         +----------+-------+-------+--------+------------------------+-----------------+  +----------+--------+--------+----------------+------------+ SubclavianPSV cm/sEDV cm/sDescribe        Arm Pressure +----------+--------+--------+----------------+------------+           145             Multiphasic, WNL             +----------+--------+--------+----------------+------------+ +---------+--------+--+--------+--+---------+ VertebralPSV cm/s33EDV cm/s13Antegrade +---------+--------+--+--------+--+---------+  ABI Findings: +---------+------------------+-----+---------+--------+ Right    Rt Pressure (mmHg)IndexWaveform Comment  +---------+------------------+-----+---------+--------+ Brachial 142                    triphasic         +---------+------------------+-----+---------+--------+ PTA      161               1.13 triphasic         +---------+------------------+-----+---------+--------+ DP       150               1.06 triphasic         +---------+------------------+-----+---------+--------+ Daleen Squibb               0.96 Normal            +---------+------------------+-----+---------+--------+ +---------+------------------+-----+---------+-------+ Left     Lt Pressure (mmHg)IndexWaveform Comment +---------+------------------+-----+---------+-------+ Brachial 140                    triphasic        +---------+------------------+-----+---------+-------+ PTA      175               1.23 triphasic        +---------+------------------+-----+---------+-------+ DP       177               1.25 triphasic        +---------+------------------+-----+---------+-------+ Great Toe168               1.18 Normal           +---------+------------------+-----+---------+-------+ +-------+---------------+----------------+ ABI/TBIToday's ABI/TBIPrevious ABI/TBI +-------+---------------+----------------+ Right   1.13/ 0.96                      +-------+---------------+----------------+  Left   1.25/ 1.18                      +-------+---------------+----------------+  Right Doppler Findings: +--------+--------+-----+---------+--------+ Site    PressureIndexDoppler  Comments +--------+--------+-----+---------+--------+ [id number]          triphasic         +--------+--------+-----+---------+--------+ Radial               triphasic         +--------+--------+-----+---------+--------+ Ulnar                triphasic         +--------+--------+-----+---------+--------+  Left Doppler Findings: +--------+--------+-----+---------+--------+ Site    PressureIndexDoppler  Comments +--------+--------+-----+---------+--------+ Brachial140          triphasic         +--------+--------+-----+---------+--------+ Radial               triphasic         +--------+--------+-----+---------+--------+ Ulnar                triphasic         +--------+--------+-----+---------+--------+   Summary: Right Carotid: Velocities in the right ICA are consistent with a 1-39% stenosis. Left Carotid: Velocities in the left ICA are consistent with a 1-39% stenosis. Vertebrals:  Bilateral vertebral arteries demonstrate antegrade flow. Subclavians: Normal flow hemodynamics were seen in bilateral subclavian              arteries. Right ABI: Resting right ankle-brachial index is within normal range. The right toe-brachial index is normal. Left ABI: Resting left ankle-brachial index is within normal range. The left toe-brachial index is normal. Right Upper Extremity: Doppler waveforms decrease <50% with right radial compression. Doppler waveform obliterate with right ulnar compression. Left Upper Extremity: Doppler waveforms decrease <50% w left radial compression. Doppler waveforms decrease 50% with left ulnar compression.  Electronically signed by Heath Lark on 01/01/2023 at 8:51:33 PM.    Final      Cardiac Studies   TTE 12/29/22  1. Left ventricular ejection fraction, by estimation, is 55 to 60%. The  left ventricle has normal function. The left ventricle has no regional  wall motion abnormalities. The left ventricular internal cavity size was  mildly to moderately dilated. Left  ventricular diastolic parameters are consistent with Grade I diastolic  dysfunction (impaired relaxation).   2. Right ventricular systolic function is normal. The right ventricular  size is normal. Tricuspid regurgitation signal is inadequate for assessing  PA pressure.   3. The mitral valve is grossly normal. Trivial mitral valve  regurgitation. No evidence of mitral stenosis.   4. The aortic valve is tricuspid. Aortic valve regurgitation is not  visualized. No aortic stenosis is present.   Cath 12/30/22 Severe MV CAD:  CULPRIT: SEVERE ULCERATED ECCENTRIC 95% proximal LAD at small diagonal branch (prior to major 2nd Diag), 30% stenosis at 2nd Diag with 75% ostial diag sidebranch; mid LAD concentric focal 75%; Proximal-mid RCA 95% ulcerated plaque tapering up to 50% (secondary culprit) Proximal LCx 60 to 65% prior to trifurcation into OM1, OM 2 and OM 3. Preserved LVEF with normal EDP (EF estimated 50 to 55%-cannot fully exclude mild anterior wall motion normality but appears to be relatively normal) recommend 2D Echo.      RECOMMENDATIONS Consider CVTS consultation for for revascularization as a potential preferred option over multivessel PCI with least 2 lesions in the LAD and 1 long RCA lesion.   If PCI is chosen,  would probably only treat the RCA and LAD however CABG would also allow revascularization of diagonal branch and OM's.  Patient Profile     62 y.o. male medical history significant of hypertension, hyperlipidemia, GERD, diabetes, COPD, childhood epilepsy, chronic hepatitis C, obesity, OSA, CAD status post MI, cocaine use, anxiety, low back pain presenting with chest pain and rules in for  NSTEMI   Assessment & Plan    1.  Non-STEMI:  CABG today. 2.  Hypertension: BP stable. 3.  Hyperlipidemia: Atorvastatin 80 mg daily 4.  Tobacco abuse: Continue nicotine replacement.    For questions or updates, please contact [facility] Please consult [email or web address] for contact info under        Signed, Orbie Pyo, MD  01/02/2023, 6:15 AM

## 2023-01-02 NOTE — Brief Op Note (Signed)
12/28/2022 - 01/02/2023  2:08 PM  PATIENT:  [name]  62 y.o. male  PRE-OPERATIVE DIAGNOSIS:  CAD  POST-OPERATIVE DIAGNOSIS:  CAD  PROCEDURE:  CORONARY ARTERY BYPASS GRAFTING (CABG) RADIAL ARTERY HARVEST (Right) TRANSESOPHAGEAL ECHOCARDIOGRAM -LIMA to LAD -Radial artery to OM -SVG to distal RCA -SVG to Diagonal Vein harvest time: Vein prep time: Artery harvest time: Artery prep time:  SURGEON:  Surgeons and Role:    * Loreli Slot, MD - Primary  PHYSICIAN ASSISTANT: Aloha Gell PA-C, Lowella Dandy PA-C  ASSISTANTS: Virgilio Frees RNFA   ANESTHESIA:   general  EBL:  600 mL   BLOOD ADMINISTERED:none  DRAINS:  Mediastinal drains    LOCAL MEDICATIONS USED:  NONE  SPECIMEN:  No Specimen  DISPOSITION OF SPECIMEN:  N/A  COUNTS CORRECT:  YES  DICTATION: .Dragon Dictation  PLAN OF CARE: Admit to inpatient   PATIENT DISPOSITION:  ICU - intubated and hemodynamically stable.   Delay start of Pharmacological VTE agent (>24hrs) due to surgical blood loss or risk of bleeding: yes

## 2023-01-02 NOTE — Interval H&P Note (Signed)
History and Physical Interval Note:  01/02/2023 7:45 AM  [name]  has presented today for surgery, with the diagnosis of CAD.  The various methods of treatment have been discussed with the patient and family. After consideration of risks, benefits and other options for treatment, the patient has consented to  Procedure(s): CORONARY ARTERY BYPASS GRAFTING (CABG) (N/A) RADIAL ARTERY HARVEST (Right) TRANSESOPHAGEAL ECHOCARDIOGRAM (N/A) as a surgical intervention.  The patient's history has been reviewed, patient examined, no change in status, stable for surgery.  I have reviewed the patient's chart and labs.  Questions were answered to the patient's satisfaction.     Loreli Slot

## 2023-01-02 NOTE — Hospital Course (Addendum)
History of Present Illness:  Mr. 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 He presented to the [facility] ED the morning of 08/17 complaining of chest pain that woke him up from his sleep. He described the pain as a chest heaviness that radiated down both arms and was associated with shortness of breath and diaphoresis. He states this was similar but worse than his past MI. He mentions increase in the frequency and severity of chest pain over the last few months with exertions and at rest. While in the ED his EKG noted to have nonspecific ST-T wave changed in the inferior leads and repeat EKG noted new T wave inversions in the anterolateral leads. His troponin I (high sensitivity) trended up and peaked at 1,078. He was ruled in for NSTEMI and was transferred to [facility]. His heart catheterizations in 2015 and 2017 showed mild nonocclusive stenosis in several of his coronary arteries. In 2022 he underwent a coronary CT, he had a coronary calcium score of 315 placing him in 88th percentile, he also had diffuse mild non-obstructive CAD 25-49% so there was a low risk for occlusive CAD. Cardiac catheterization on 12/30/22 showed 30-95% LAD stenosis, 60% circumflex stenosis, and 50-95% RCA stenosis. His echocardiogram on 08/18 showed LVEF 55-60% with grade I diastolic dysfunction and trivial mitral valve regurgitation with otherwise normal valves.  Cardiothoracic surgery consultation was requested.His A1C was 6.6 and he does not currently take diabetes medications at home. Pt states he does not have diabetes. Urine  toxicology was positive for opiates and THC but negative for cocaine. Patient had received morphine prior to toxicology screen, denies drug use other than marijuana use. He is currently chest pain free.   Hospital Course:  He was evaluated by Dr. Dorris Fetch who felt he was a surgical candidate.  The risks and benefits of the procedure were explained to the patient and he was agreeable to proceed.  He was taken to the operating room on 01/02/2023.  He underwent CABG x 4 utilizing LIMA to LAD, Right Radial Artery to OM, SVG to Diagonal, and SVG to distal RCA.  He also underwent open right radial artery harvest and endoscopic vein harvest from his left leg.  He tolerated the procedure without difficulty and was taken to the SICU in stable condition. He was extubated early the evening of surgery. Swan,  a line, and chest tubes were removed.  He was started on Lopressor and transitioned from Nitroglycerin drip to oral Imdur for radial artery conduit. He has OSA and used bi pap at night. He was volume overloaded and diuresed accordingly. He was transitioned off the Insulin drip. His pre op HGA1C was 6.6. He will be restarted on his Metformin later in his post op course. He had expected post op blood loss anemia. He did not require a post op transfusion. He also had mild thrombocytopenia. He was felt stable for transfer from the ICU to 4E for further convalescence on 08/**.  He did develop atrial fibrillation/flutter with RVR and amiodarone protocol was initiated.  Additionally beta-blocker was uptitrated in the EP team has assisted with management.  He has a  minor expected acute blood loss anemia which is stabilized.  He did have a reactive postoperative thrombocytopenia which is showing improving trend over time.  A limited echocardiogram was performed on postop day 3 which showed improved LV function and no significant finding consistent with pericardial effusion.

## 2023-01-03 ENCOUNTER — Encounter (HOSPITAL_COMMUNITY): Payer: Self-pay | Admitting: Thoracic Surgery (Cardiothoracic Vascular Surgery)

## 2023-01-03 ENCOUNTER — Inpatient Hospital Stay (HOSPITAL_COMMUNITY): Payer: 59

## 2023-01-03 DIAGNOSIS — I214 Non-ST elevation (NSTEMI) myocardial infarction: Secondary | ICD-10-CM | POA: Diagnosis not present

## 2023-01-03 LAB — BASIC METABOLIC PANEL
Anion gap: 6 (ref 5–15)
Anion gap: 7 (ref 5–15)
BUN: 12 mg/dL (ref 6–20)
BUN: 13 mg/dL (ref 6–20)
CO2: 25 mmol/L (ref 22–32)
CO2: 26 mmol/L (ref 22–32)
Calcium: 7.7 mg/dL — ABNORMAL LOW (ref 8.9–10.3)
Calcium: 7.9 mg/dL — ABNORMAL LOW (ref 8.9–10.3)
Chloride: 105 mmol/L (ref 98–111)
Chloride: 103 mmol/L (ref 98–111)
Creatinine, Ser: 0.75 mg/dL (ref 0.61–1.24)
Creatinine, Ser: 0.77 mg/dL (ref 0.61–1.24)
GFR, Estimated: >60 mL/min (ref >60)
GFR, Estimated: >60 mL/min (ref >60)
Glucose, Bld: 146 mg/dL — ABNORMAL HIGH (ref 70–99)
Glucose, Bld: 174 mg/dL — ABNORMAL HIGH (ref 70–99)
Potassium: 4 mmol/L (ref 3.5–5.1)
Potassium: 4.1 mmol/L (ref 3.5–5.1)
Sodium: 136 mmol/L (ref 135–145)
Sodium: 136 mmol/L (ref 135–145)

## 2023-01-03 LAB — GLUCOSE, CAPILLARY
Glucose-Capillary: 126 mg/dL — ABNORMAL HIGH (ref 70–99)
Glucose-Capillary: 127 mg/dL — ABNORMAL HIGH (ref 70–99)
Glucose-Capillary: 129 mg/dL — ABNORMAL HIGH (ref 70–99)
Glucose-Capillary: 132 mg/dL — ABNORMAL HIGH (ref 70–99)
Glucose-Capillary: 135 mg/dL — ABNORMAL HIGH (ref 70–99)
Glucose-Capillary: 138 mg/dL — ABNORMAL HIGH (ref 70–99)
Glucose-Capillary: 142 mg/dL — ABNORMAL HIGH (ref 70–99)
Glucose-Capillary: 147 mg/dL — ABNORMAL HIGH (ref 70–99)
Glucose-Capillary: 152 mg/dL — ABNORMAL HIGH (ref 70–99)
Glucose-Capillary: 155 mg/dL — ABNORMAL HIGH (ref 70–99)
Glucose-Capillary: 70 mg/dL (ref 70–99)

## 2023-01-03 LAB — CBC
HCT: 36.7 % — ABNORMAL LOW (ref 39.0–52.0)
HCT: 37 % — ABNORMAL LOW (ref 39.0–52.0)
Hemoglobin: 12.4 g/dL — ABNORMAL LOW (ref 13.0–17.0)
Hemoglobin: 12.7 g/dL — ABNORMAL LOW (ref 13.0–17.0)
MCH: 30.4 pg (ref 26.0–34.0)
MCH: 31.8 pg (ref 26.0–34.0)
MCHC: 33.8 g/dL (ref 30.0–36.0)
MCHC: 34.3 g/dL (ref 30.0–36.0)
MCV: 90 fL (ref 80.0–100.0)
MCV: 92.7 fL (ref 80.0–100.0)
Platelets: 114 10*3/uL — ABNORMAL LOW (ref 150–400)
Platelets: 117 10*3/uL — ABNORMAL LOW (ref 150–400)
RBC: 4.08 MIL/uL — ABNORMAL LOW (ref 4.22–5.81)
RBC: 3.99 MIL/uL — ABNORMAL LOW (ref 4.22–5.81)
RDW: 12.8 % (ref 11.5–15.5)
RDW: 13 % (ref 11.5–15.5)
WBC: 7.9 10*3/uL (ref 4.0–10.5)
WBC: 9.3 10*3/uL (ref 4.0–10.5)
nRBC: 0 % (ref 0.0–0.2)
nRBC: 0 % (ref 0.0–0.2)

## 2023-01-03 LAB — MAGNESIUM
Magnesium: 2.5 mg/dL — ABNORMAL HIGH (ref 1.7–2.4)
Magnesium: 2.1 mg/dL (ref 1.7–2.4)

## 2023-01-03 MED ORDER — IPRATROPIUM-ALBUTEROL 0.5-2.5 (3) MG/3ML IN SOLN: 3.0000 mL | Freq: Four times a day (QID) | RESPIRATORY_TRACT | Status: DC

## 2023-01-03 MED ORDER — ENOXAPARIN SODIUM 40 MG/0.4ML IJ SOSY
40.0000 mg | PREFILLED_SYRINGE | Freq: Every day | INTRAMUSCULAR | Status: DC
  Administered 2023-01-03 – 2023-01-05 (×3): 40 mg via SUBCUTANEOUS
  Filled 2023-01-03 (×3): qty 0.4

## 2023-01-03 MED ORDER — INSULIN ASPART 100 UNIT/ML IJ SOLN
0.0000 [IU] | INTRAMUSCULAR | Status: DC
  Administered 2023-01-03: 2 [IU] via SUBCUTANEOUS
  Administered 2023-01-04: 8 [IU] via SUBCUTANEOUS
  Administered 2023-01-04: 2 [IU] via SUBCUTANEOUS
  Administered 2023-01-04: 4 [IU] via SUBCUTANEOUS

## 2023-01-03 MED ORDER — ALPRAZOLAM 0.5 MG PO TABS
0.5000 mg | ORAL_TABLET | Freq: Two times a day (BID) | ORAL | Status: DC | PRN
  Administered 2023-01-03 – 2023-01-08 (×10): 0.5 mg via ORAL
  Filled 2023-01-03 (×11): qty 1

## 2023-01-03 MED ORDER — ISOSORBIDE MONONITRATE ER 30 MG PO TB24
30.0000 mg | ORAL_TABLET | Freq: Every day | ORAL | Status: DC
  Administered 2023-01-03 – 2023-01-09 (×7): 30 mg via ORAL
  Filled 2023-01-03 (×7): qty 1

## 2023-01-03 MED ORDER — ALBUTEROL SULFATE (2.5 MG/3ML) 0.083% IN NEBU
INHALATION_SOLUTION | RESPIRATORY_TRACT | Status: AC
  Filled 2023-01-03: qty 3

## 2023-01-03 MED ORDER — FUROSEMIDE 10 MG/ML IJ SOLN
20.0000 mg | Freq: Two times a day (BID) | INTRAMUSCULAR | Status: DC
  Administered 2023-01-03 – 2023-01-04 (×3): 20 mg via INTRAVENOUS
  Filled 2023-01-03 (×3): qty 2

## 2023-01-03 MED ORDER — ORAL CARE MOUTH RINSE: 15.0000 mL | OROMUCOSAL | Status: DC | PRN

## 2023-01-03 MED ORDER — ALBUTEROL SULFATE (2.5 MG/3ML) 0.083% IN NEBU
2.5000 mg | INHALATION_SOLUTION | Freq: Four times a day (QID) | RESPIRATORY_TRACT | Status: DC | PRN
  Administered 2023-01-03: 2.5 mg via RESPIRATORY_TRACT

## 2023-01-03 MED ORDER — LOSARTAN POTASSIUM 25 MG PO TABS
25.0000 mg | ORAL_TABLET | Freq: Every day | ORAL | Status: DC
  Administered 2023-01-03 – 2023-01-06 (×4): 25 mg via ORAL
  Filled 2023-01-03 (×4): qty 1

## 2023-01-03 MED ORDER — METOPROLOL TARTRATE 12.5 MG HALF TABLET: 12.5000 mg | ORAL_TABLET | Freq: Two times a day (BID) | ORAL | Status: DC

## 2023-01-03 MED ORDER — POTASSIUM CHLORIDE 10 MEQ/50ML IV SOLN
10.0000 meq | INTRAVENOUS | Status: AC
  Administered 2023-01-03 (×3): 10 meq via INTRAVENOUS
  Filled 2023-01-03 (×3): qty 50

## 2023-01-03 MED ORDER — INSULIN DETEMIR 100 UNIT/ML ~~LOC~~ SOLN
15.0000 [IU] | Freq: Two times a day (BID) | SUBCUTANEOUS | Status: DC
  Administered 2023-01-03 (×2): 15 [IU] via SUBCUTANEOUS
  Filled 2023-01-03 (×4): qty 0.15

## 2023-01-03 MED ORDER — GUAIFENESIN-DM 100-10 MG/5ML PO SYRP
5.0000 mL | ORAL_SOLUTION | ORAL | Status: DC | PRN
  Administered 2023-01-03 – 2023-01-05 (×2): 5 mL via ORAL
  Filled 2023-01-03 (×2): qty 5

## 2023-01-03 MED FILL — Magnesium Sulfate Inj 50%: INTRAMUSCULAR | Qty: 10 | Status: AC

## 2023-01-03 MED FILL — Potassium Chloride Inj 2 mEq/ML: INTRAVENOUS | Qty: 40 | Status: AC

## 2023-01-03 MED FILL — Heparin Sodium (Porcine) Inj 1000 Unit/ML: Qty: 1000 | Status: AC

## 2023-01-03 NOTE — Progress Notes (Signed)
Pt was placed on hospital CPAP machine, after an hour pt could no longer tolerate. Wife brought in home CPAP machine, pt placed on home CPAP to rest.

## 2023-01-03 NOTE — Discharge Summary (Addendum)
[address]       [place] [address]             [phone]    Physician Discharge Summary  Patient ID: [name] MRN: [id number] DOB/AGE: May 10, 1961 62 y.o.  Admit date: 12/28/2022 Discharge date: 01/09/2023  Admission Diagnoses: 3 Vessel CAD Non-ST elevation MI Patient Active Problem List   Diagnosis Date Noted   Atrial fibrillation (HCC) - Post Op Aflutter/Fib 01/06/2023   S/P CABG x 4 01/02/2023   NSTEMI (non-ST elevated myocardial infarction) (HCC) 12/28/2022   Cervical radiculopathy 05/01/2022   Type 2 diabetes mellitus with obesity (HCC) 04/25/2021   Atherosclerosis of aorta (HCC) 03/02/2021   Coronary artery disease involving native coronary artery of native heart with unstable angina pectoris (HCC) 02/19/2021   Cocaine abuse (HCC) 02/19/2021   Pulmonary nodule 06/06/2020   Neoplasm of uncertain behavior of skin 07/29/2019   Medically noncompliant 12/08/2018   Hyperlipidemia associated with type 2 diabetes mellitus (HCC) 02/18/2017   Overactive bladder 02/02/2015   Microhematuria 02/02/2015   COPD mixed type (HCC) 07/17/2014   Pneumonitis 07/17/2014   OSA (obstructive sleep apnea) 07/17/2014   Tobacco dependence 07/17/2014   Low back pain radiating to left lower extremity 11/25/2013   Eczema 07/24/2011   Essential hypertension 01/11/2010   Morbid obesity (HCC) 09/03/2007   PARASOMNIA 09/03/2007   Epilepsy (HCC) 03/10/2007   Anxiety disorder 12/20/2006   MYOCARDIAL INFARCTION, HX OF 12/20/2006   GERD 12/20/2006     Discharge Diagnoses:  3 Vessel CAD Non-ST elevation MI Patient Active Problem List   Diagnosis Date Noted   Atrial fibrillation (HCC) - Post Op Aflutter/Fib 01/06/2023   S/P CABG x 4 01/02/2023   NSTEMI (non-ST elevated myocardial infarction) (HCC) 12/28/2022   Cervical radiculopathy 05/01/2022   Type 2 diabetes mellitus with obesity (HCC) 04/25/2021   Atherosclerosis of aorta (HCC) 03/02/2021    Coronary artery disease involving native coronary artery of native heart with unstable angina pectoris (HCC) 02/19/2021   Cocaine abuse (HCC) 02/19/2021   Pulmonary nodule 06/06/2020   Neoplasm of uncertain behavior of skin 07/29/2019   Medically noncompliant 12/08/2018   Hyperlipidemia associated with type 2 diabetes mellitus (HCC) 02/18/2017   Overactive bladder 02/02/2015   Microhematuria 02/02/2015   COPD mixed type (HCC) 07/17/2014   Pneumonitis 07/17/2014   OSA (obstructive sleep apnea) 07/17/2014   Tobacco dependence 07/17/2014   Low back pain radiating to left lower extremity 11/25/2013   Eczema 07/24/2011   Essential hypertension 01/11/2010   Morbid obesity (HCC) 09/03/2007   PARASOMNIA 09/03/2007   Epilepsy (HCC) 03/10/2007   Anxiety disorder 12/20/2006   MYOCARDIAL INFARCTION, HX OF 12/20/2006   GERD 12/20/2006     Discharged Condition: stable  History of Present Illness:  [name] is a 62 year old male with a past medical history of hypertension, hyperlipidemia, type 2 diabetes mellitus, OSA (uses CPAP), CAD status post MI (over 10 years ago, related to cocaine abuse per patient), aortic atherosclerosis, COPD, childhood epilepsy, chronic hepatitis C (positive Ab in 2017, negative PCR), obesity, marijuana use, cocaine use, tobacco use, pulmonary nodules, low back pain and anxiety. He presented to the [facility] ED the morning of 08/17 complaining of chest pain that woke him up from his sleep. He described the pain as a chest heaviness that radiated down both arms and was associated with shortness of breath and diaphoresis. He states this was similar but worse than  his past MI. He mentions increase in the frequency and severity of chest pain over the last few months with exertions and at rest. While in the ED his EKG noted to have nonspecific ST-T wave changed in the inferior leads and repeat EKG noted new T wave inversions in the anterolateral  leads. His troponin I (high sensitivity) trended up and peaked at 1,078. He was ruled in for NSTEMI and was transferred to [facility]. His heart catheterizations in 2015 and 2017 showed mild nonocclusive stenosis in several of his coronary arteries. In 2022 he underwent a coronary CT, he had a coronary calcium score of 315 placing him in 88th percentile, he also had diffuse mild non-obstructive CAD 25-49% so there was a low risk for occlusive CAD. Cardiac catheterization on 12/30/22 showed 30-95% LAD stenosis, 60% circumflex stenosis, and 50-95% RCA stenosis. His echocardiogram on 08/18 showed LVEF 55-60% with grade I diastolic dysfunction and trivial mitral valve regurgitation with otherwise normal valves.  Cardiothoracic surgery consultation was requested.His A1C was 6.6 and he does not currently take diabetes medications at home. Pt states he does not have diabetes. Urine toxicology was positive for opiates and THC but negative for cocaine. Patient had received morphine prior to toxicology screen, denies drug use other than marijuana use. He is currently chest pain free.   Hospital Course:  He was evaluated by Dr. Dorris Fetch who felt he was a surgical candidate.  The risks and benefits of the procedure were explained to the patient and he was agreeable to proceed.  He was taken to the operating room on 01/02/2023.  He underwent CABG x 4 utilizing LIMA to LAD, Right Radial Artery to OM, SVG to Diagonal, and SVG to distal RCA.  He also underwent open right radial artery harvest and endoscopic vein harvest from his left leg.  He tolerated the procedure without difficulty and was taken to the SICU in stable condition. He was extubated early the evening of surgery. Swan,  a line, and chest tubes were removed.  He was started on Lopressor and transitioned from Nitroglycerin drip to oral Imdur for radial artery conduit. He has OSA and used bi pap at night. He was volume overloaded and diuresed accordingly.  He was transitioned off the Insulin drip. His pre op HGA1C was 6.6. He was restarted on his Metformin on 08/26 and educated on importance of blood sugar control. He had expected post op blood loss anemia. He did not require a post op transfusion and this stabilized with time. He also had a reactive postoperative thrombocytopenia which showed an improving trend over time. He was felt stable for transfer from the ICU to 4E for further convalescence on 08/24.  He developed atrial fibrillation/flutter with RVR and amiodarone protocol was initiated. Additionally beta-blocker was uptitrated and the EP team assisted with management. A limited echocardiogram was performed on postop day 3 which showed improved LV function and no significant finding consistent with pericardial effusion. He remained in atrial flutter with RVR on IV Amiodarone, he was given another bolus of Amiodarone to assist with chemical conversion. He was started on Eliquis for stroke prevention. Losartan was stopped so that Lopressor could be increased to 50 mg tid and he was given another IV Amiodarone bolus. Cardiology arranged for patient to undergo a TEE/DCCV on 08/27. He converted and remained in sinus rhythm. He was transitioned from IV Amiodarone to oral Amiodarone. Lopressor was changed to 50 mg bid. He has been ambulating on room air with good  oxygenation. All wounds are clean, dry, healing without signs of infection. Motor/sensory intact RUE (right radial artery harvest). He has been tolerating a diet and has had a bowel movement. Chest tube sutures were removed on 08/29. He is stable for discharge today.  Consults: cardiology  Significant Diagnostic Studies:  LEFT HEART CATH AND CORONARY ANGIOGRAPHY     Prox LAD lesion is 70% stenosed.  Prox LAD to Mid LAD lesion is 95% stenosed.   Mid LAD-1 lesion is 30% stenosed with 75% stenosed side branch in 2nd Diag.   Mid LAD-2 lesion is 75% stenosed.   Mid Cx lesion is 60% stenosed.   Prox  RCA to Mid RCA lesion is 95% stenosed.  Mid RCA lesion is 50% stenosed.   The left ventricular systolic function is normal.  The left ventricular ejection fraction is 50-55% by visual estimate.   LV end diastolic pressure is normal.   There is no aortic valve stenosis.     ECHOCARDIOGRAM REPORT       Patient Name:   [name] Date of Exam: 12/29/2022  Medical Rec #:  [id number]                 Height:       72.0 in  Accession #:    [id number]                Weight:       271.6 lb  Date of Birth:  07/30/1960                BSA:          2.427 m  Patient Age:    61 years                  BP:           149/83 mmHg  Patient Gender: M                         HR:           65 bpm.  Exam Location:  Inpatient   Procedure: 2D Echo, Cardiac Doppler, Color Doppler and Intracardiac             Opacification Agent   Indications:    NSTEMI I21.4    History:        Patient has prior history of Echocardiogram examinations  and                 Patient has no prior history of Echocardiogram  examinations.                 CAD; Risk Factors:Dyslipidemia and Diabetes.    Sonographer:    Harriette Bouillon RDCS  Referring Phys: [id number] Cecille Po MELVIN   IMPRESSIONS     1. Left ventricular ejection fraction, by estimation, is 55 to 60%. The  left ventricle has normal function. The left ventricle has no regional  wall motion abnormalities. The left ventricular internal cavity size was  mildly to moderately dilated. Left  ventricular diastolic parameters are consistent with Grade I diastolic  dysfunction (impaired relaxation).   2. Right ventricular systolic function is normal. The right ventricular  size is normal. Tricuspid regurgitation signal is inadequate for assessing  PA pressure.   3. The mitral valve is grossly normal. Trivial mitral valve  regurgitation. No evidence of mitral stenosis.   4. The aortic valve is tricuspid. Aortic valve regurgitation  is not  visualized. No  aortic stenosis is present.   FINDINGS   Left Ventricle: Left ventricular ejection fraction, by estimation, is 55  to 60%. The left ventricle has normal function. The left ventricle has no  regional wall motion abnormalities. Definity contrast agent was given IV  to delineate the left ventricular   endocardial borders. The left ventricular internal cavity size was mildly  to moderately dilated. There is no left ventricular hypertrophy. Left  ventricular diastolic parameters are consistent with Grade I diastolic  dysfunction (impaired relaxation).   Right Ventricle: The right ventricular size is normal. No increase in  right ventricular wall thickness. Right ventricular systolic function is  normal. Tricuspid regurgitation signal is inadequate for assessing PA  pressure.   Left Atrium: Left atrial size was normal in size.   Right Atrium: Right atrial size was normal in size.   Pericardium: There is no evidence of pericardial effusion. Presence of  epicardial fat layer.   Mitral Valve: The mitral valve is grossly normal. Trivial mitral valve  regurgitation. No evidence of mitral valve stenosis.   Tricuspid Valve: The tricuspid valve is grossly normal. Tricuspid valve  regurgitation is trivial. No evidence of tricuspid stenosis.   Aortic Valve: The aortic valve is tricuspid. Aortic valve regurgitation is  not visualized. No aortic stenosis is present.   Pulmonic Valve: The pulmonic valve was grossly normal. Pulmonic valve  regurgitation is not visualized. No evidence of pulmonic stenosis.   Aorta: The aortic root and ascending aorta are structurally normal, with  no evidence of dilitation.   Venous: The inferior vena cava was not well visualized.   IAS/Shunts: The atrial septum is grossly normal.     LEFT VENTRICLE  PLAX 2D  LVIDd:         6.30 cm   Diastology  LVIDs:         5.00 cm   LV e' medial:   5.22 cm/s  LV PW:         1.00 cm   LV E/e' medial: 9.5  LV IVS:         1.00 cm  LVOT diam:     2.60 cm  LV SV:         90  LV SV Index:   37  LVOT Area:     5.31 cm     RIGHT VENTRICLE  TAPSE (M-mode): 1.8 cm   LEFT ATRIUM             Index        RIGHT ATRIUM           Index  LA diam:        4.80 cm 1.98 cm/m   RA Area:     12.40 cm  LA Vol (A2C):   49.1 ml 20.23 ml/m  RA Volume:   24.10 ml  9.93 ml/m  LA Vol (A4C):   78.7 ml 32.43 ml/m  LA Biplane Vol: 63.3 ml 26.09 ml/m   AORTIC VALVE  LVOT Vmax:   86.50 cm/s  LVOT Vmean:  63.600 cm/s  LVOT VTI:    0.170 m    AORTA  Ao Root diam: 3.60 cm  Ao Asc diam:  3.40 cm   MITRAL VALVE  MV Area (PHT): 2.43 cm    SHUNTS  MV Decel Time: 312 msec    Systemic VTI:  0.17 m  MV E velocity: 49.40 cm/s  Systemic Diam: 2.60 cm  MV A velocity: 74.50 cm/s  MV E/A ratio:  0.66   Lennie Odor MD  Electronically signed by Lennie Odor MD  Signature Date/Time: 12/29/2022/10:55:44 AM      Final     Treatments: surgery:  Operative Report    DATE OF PROCEDURE: 01/02/2023   PREPROCEDURE DIAGNOSIS:  Severe 3-vessel coronary artery disease.   POSTOPERATIVE DIAGNOSIS:  Severe 3-vessel coronary artery disease.   PROCEDURES PERFORMED:  Median sternotomy, extracorporeal circulation, coronary artery bypass grafting x4 (left internal mammary artery to LAD, left radial artery to obtuse marginal 1, saphenous vein graft to distal right coronary, saphenous vein graft to  first diagonal).  Endoscopic vein harvest right thigh, open right radial artery harvest.   Discharge Exam: Blood pressure (!) 144/78, pulse 68, temperature 98.4 F (36.9 C), temperature source Oral, resp. rate 18, height 6' (1.829 m), weight 123.7 kg, SpO2 94%. Cardiovascular: RRR Pulmonary: Clear to auscultation bilaterally Abdomen: Soft, non tender, bowel sounds present. Extremities: Mild bilateral lower extremity edema. RUE motor/sensory intact. Wounds: Sternal, RUE, and right LE wounds are all clean and dry.  No erythema or signs of  infection.   Discharge Medications:  The patient has been discharged on:   1.Beta Blocker:  Yes [  X ]                              No   [   ]                              If No, reason:  2.Ace Inhibitor/ARB: Yes [   ]                                     No  [   x ]                                     If No, reason:Hope to start after discharge when BP allows   3.Statin:   Yes [ X  ]                  No  [   ]                  If No, reason:  4.Ecasa:  Yes  [ X  ]                  No   [   ]                  If No, reason:  Patient had ACS upon admission: Yes  Plavix/P2Y12 inhibitor: Yes [   ]                                      No  [  x ]On Apixaban for a fib     Discharge Instructions     AMB referral to Phase II Cardiac Rehabilitation   Complete by: As directed    Diagnosis:  NSTEMI CABG     CABG X ___: 4 Comment - pending   After initial evaluation and assessments completed: Virtual Based Care may be provided alone or  in conjunction with Phase 2 Cardiac Rehab based on patient barriers.: Yes   Intensive Cardiac Rehabilitation (ICR) [facility] location only OR Traditional Cardiac Rehabilitation (TCR) *If criteria for ICR are not met will enroll in TCR [facility] only): Yes      Allergies as of 01/09/2023       Reactions   Ace Inhibitors    Lisinopril    cough   Paxlovid [nirmatrelvir-ritonavir]    ?rash   Tramadol    Pt had seizures while on it        Medication List     STOP taking these medications    metFORMIN 500 MG tablet Commonly known as: GLUCOPHAGE Replaced by: metFORMIN 500 MG 24 hr tablet   nitroGLYCERIN 0.4 MG SL tablet Commonly known as: NITROSTAT       TAKE these medications    ALPRAZolam 1 MG tablet Commonly known as: XANAX TAKE 1 TABLET BY MOUTH 2 TIMES DAILY AS NEEDED.   amiodarone 200 MG tablet Commonly known as: PACERONE Take 400 mg orally two times daily for 2 days;then take 200 mg orally two times daily for 10 days;then take  200 mg orally daily thereafter   apixaban 5 MG Tabs tablet Commonly known as: ELIQUIS Take 1 tablet (5 mg total) by mouth 2 (two) times daily.   aspirin EC 81 MG tablet Take 81 mg by mouth daily. Swallow whole.   atorvastatin 80 MG tablet Commonly known as: LIPITOR Take 1 tablet (80 mg total) by mouth daily.   budesonide-formoterol 160-4.5 MCG/ACT inhaler Commonly known as: Symbicort INHALE 2 PUFS INTO THE LUNGS TWICE A DAY   diclofenac 75 MG EC tablet Commonly known as: VOLTAREN Take 1 tablet (75 mg total) by mouth 2 (two) times daily as needed. Take 75 mg by mouth 2 (two) times daily as needed. Start taking on: March 10, 2024 What changed: These instructions start on March 10, 2024. If you are unsure what to do until then, ask your doctor or other care provider.   furosemide 40 MG tablet Commonly known as: LASIX Take 1 tablet (40 mg total) by mouth daily. For 5 days then stop.   isosorbide mononitrate 30 MG 24 hr tablet Commonly known as: IMDUR Take 1 tablet (30 mg total) by mouth daily.   metFORMIN 500 MG 24 hr tablet Commonly known as: GLUCOPHAGE-XR Take 1 tablet (500 mg total) by mouth daily with breakfast. Replaces: metFORMIN 500 MG tablet   metoprolol tartrate 50 MG tablet Commonly known as: LOPRESSOR Take 1 tablet (50 mg total) by mouth 2 (two) times daily. What changed:  medication strength how much to take when to take this additional instructions   nicotine 21 mg/24hr patch Commonly known as: NICODERM CQ - dosed in mg/24 hours Place 1 patch (21 mg total) onto the skin daily.   ONE TOUCH ULTRA 2 w/Device Kit Use to check blood sugars twice a day   OneTouch Ultra test strip Generic drug: glucose blood Use to check blood sugars twice a day   OneTouch UltraSoft 2 Lancets Misc 1 Lancet by Does not apply route 2 (two) times daily.   oxyCODONE 5 MG immediate release tablet Commonly known as: Oxy IR/ROXICODONE Take 1 tablet (5 mg total) by mouth  every 6 (six) hours as needed for severe pain.   potassium chloride SA 20 MEQ tablet Commonly known as: KLOR-CON M Take 1 tablet (20 mEq total) by mouth daily. For 5 days then stop.  Follow-up Information     [facility] Follow up on 01/28/2023.   Why: To get chest xray at 12:30PM Contact information: [address] [place] [address]        [facility] Follow up on 01/28/2023.   Specialty: Cardiothoracic Surgery Why: Follow up is at 1:30PM Contact information: [address] [place], [address] [place] [address] [phone]        [facility] [place] Follow up on 01/22/2023.   Specialties: Cardiology, Radiology Why: Cardiology follow up is at 10:55AM Contact information: [address] [place] [address] [phone]                 Signed:  Ardelle Balls, PA-C 01/09/2023, 9:02 AM

## 2023-01-03 NOTE — Progress Notes (Signed)
1 Day Post-Op Procedure(s) (LRB): CORONARY ARTERY BYPASS GRAFTING (CABG) (N/A) RADIAL ARTERY HARVEST (Right) TRANSESOPHAGEAL ECHOCARDIOGRAM (N/A) Subjective: Some incisional pain, denies nausea  Objective: Vital signs in last 24 hours: Temp:  [97.8 F (36.6 C)-98.1 F (36.7 C)] 97.8 F (36.6 C) (08/22 1600) Pulse Rate:  [66-89] 83 (08/23 0600) Cardiac Rhythm: Normal sinus rhythm (08/23 0400) Resp:  [0-30] 0 (08/23 0600) BP: (101-142)/(55-89) 108/57 (08/23 0600) SpO2:  [87 %-100 %] 87 % (08/23 0600) Arterial Line BP: (99-166)/(50-80) 111/54 (08/23 0600) FiO2 (%):  [21 %-50 %] 21 % (08/22 2018) Weight:  [409 kg] 133 kg (08/23 0500)  Hemodynamic parameters for last 24 hours: PAP: (19-39)/(8-27) 24/15  Intake/Output from previous day: 08/22 0701 - 08/23 0700 In: 4114.4 [P.O.:240; I.V.:2076.6; Blood:400; IV Piggyback:1397.8] Out: 3947 [Urine:3020; Blood:600; Chest Tube:327] Intake/Output this shift: No intake/output data recorded.  General appearance: alert, cooperative, and no distress Neurologic: intact Heart: regular rate and rhythm Lungs: diminished breath sounds bibasilar Abdomen: slightly distended, + BS  Lab Results: Recent Labs    01/02/23 2008 01/03/23 0415  WBC 9.2 7.9  HGB 13.2 12.4*  HCT 38.9* 36.7*  PLT 119* 114*   BMET:  Recent Labs    01/02/23 2008 01/03/23 0415  NA 140 136  K 4.3 4.0  CL 105 105  CO2 24 25  GLUCOSE 136* 146*  BUN 14 12  CREATININE 0.77 0.75  CALCIUM 8.1* 7.7*    PT/INR:  Recent Labs    01/02/23 1455  LABPROT 16.2*  INR 1.3*   ABG    Component Value Date/Time   PHART 7.332 (L) 01/02/2023 1849   HCO3 24.4 01/02/2023 1849   TCO2 26 01/02/2023 1849   ACIDBASEDEF 2.0 01/02/2023 1849   O2SAT 85 01/02/2023 1849   CBG (last 3)  Recent Labs    01/03/23 0313 01/03/23 0415 01/03/23 0607  GLUCAP 135* 126* 127*    Assessment/Plan: S/P Procedure(s) (LRB): CORONARY ARTERY BYPASS GRAFTING (CABG) (N/A) RADIAL ARTERY  HARVEST (Right) TRANSESOPHAGEAL ECHOCARDIOGRAM (N/A) POD # 1 NEURO- intact CV- in SR, hemodynamics OK  Normal PA pressures, co-ox 65  ASA, beta blocker, statin  Dc Swan and A line RESP- IS for atelectasis, Home BIPAP PRN RENAL- creatinine and lytes OK  Diurese ENDO- CBG controlled with insulin drip  Transition to levemir + SSI GI- advance diet as tolerated Thrombocytopenia- stable, monitor Dc chest tubes Mobilize  LOS: 5 days    Loreli Slot 01/03/2023

## 2023-01-03 NOTE — Progress Notes (Signed)
   Patient Name: [name] Date of Encounter: 01/03/2023 [facility] Cardiologist: Norman Herrlich, MD   Interval Summary  .    POD 1 LIMA to LAD, radial to OM1, VG to RCA, VG to D1  Insulin/nitroglycerin 4.2  Afebrile, MAP 70-90, PAP 28/15, spo2 91%, I/O +167 (+ 5.9L since admission)  No complaints other than incisional chest pain  Vital Signs .    Vitals:   01/03/23 0400 01/03/23 0430 01/03/23 0500 01/03/23 0600  BP: 122/66 127/81 (!) 141/72 (!) 108/57  Pulse: 82 86 89 83  Resp: (!) 0 18 (!) 26 (!) 0  Temp:      TempSrc:      SpO2: 90% 92% 90% (!) 87%  Weight:      Height:        Intake/Output Summary (Last 24 hours) at 01/03/2023 0702 Last data filed at 01/03/2023 0700 Gross per 24 hour  Intake 4114.4 ml  Output 3947 ml  Net 167.4 ml      01/02/2023    4:34 AM 12/28/2022    4:00 PM 12/28/2022    6:02 AM  Last 3 Weights  Weight (lbs) 273 lb 13 oz 271 lb 9.7 oz 280 lb  Weight (kg) 124.2 kg 123.2 kg 127.007 kg      Telemetry/ECG    SR, NSVT x 1- Personally Reviewed  Physical Exam .   GEN: No acute distress.   Neck: No JVD Cardiac: RRR, no murmurs, rubs, or gallops. Dressing/chest tubes/pacing wires in place Respiratory: Anterior lungs fields coarse GI: Soft, nontender, non-distended  MS: No edema  Assessment & Plan .      ACS:  POD 1 s/p MV CABG.  Will start Lopressor 12.5mg  BID for improved BP control.  Cont ASA (decrease to 81mg  tomorrow).  Will start plavix later this admission (DAPT x 1 year).  Cont atorvastatin 80mg .  Watch volume status HTN:  On nitroglycerin gtt; start BB as above HL:  High dose atorvastatin Tobacco abuse: Nicotine replacement. T2DM:  ASA, statin; will start ARB and SGLT2i later this admission.  Consider [id number] as outpatient.  For questions or updates, please contact  [facility] Please consult [email or web address] for contact info under        Signed, Orbie Pyo, MD

## 2023-01-03 NOTE — Progress Notes (Signed)
      [address]       [place] [address]             [phone]      POD # 1 CABG  Up in chair  BP (!) 110/57 (BP Location: Left Arm)   Pulse 86   Temp 97.9 F (36.6 C) (Axillary)   Resp (!) 0   Ht 6' (1.829 m)   Wt 133 kg   SpO2 95%   BMI 39.77 kg/m   3L [place] 96% sat   Intake/Output Summary (Last 24 hours) at 01/03/2023 1655 Last data filed at 01/03/2023 1600 Gross per 24 hour  Intake 1638.35 ml  Output 1742 ml  Net -103.65 ml   CBG mildly elevated PM labs pending  Viviann Spare C. Dorris Fetch, MD [facility] [phone]

## 2023-01-04 ENCOUNTER — Inpatient Hospital Stay (HOSPITAL_COMMUNITY): Payer: 59

## 2023-01-04 DIAGNOSIS — I214 Non-ST elevation (NSTEMI) myocardial infarction: Secondary | ICD-10-CM | POA: Diagnosis not present

## 2023-01-04 LAB — CBC
HCT: 36.8 % — ABNORMAL LOW (ref 39.0–52.0)
Hemoglobin: 12.3 g/dL — ABNORMAL LOW (ref 13.0–17.0)
MCH: 30.7 pg (ref 26.0–34.0)
MCHC: 33.4 g/dL (ref 30.0–36.0)
MCV: 91.8 fL (ref 80.0–100.0)
Platelets: 113 10*3/uL — ABNORMAL LOW (ref 150–400)
RBC: 4.01 MIL/uL — ABNORMAL LOW (ref 4.22–5.81)
RDW: 13.2 % (ref 11.5–15.5)
WBC: 9.5 10*3/uL (ref 4.0–10.5)
nRBC: 0 % (ref 0.0–0.2)

## 2023-01-04 LAB — GLUCOSE, CAPILLARY
Glucose-Capillary: 121 mg/dL — ABNORMAL HIGH (ref 70–99)
Glucose-Capillary: 134 mg/dL — ABNORMAL HIGH (ref 70–99)
Glucose-Capillary: 135 mg/dL — ABNORMAL HIGH (ref 70–99)
Glucose-Capillary: 164 mg/dL — ABNORMAL HIGH (ref 70–99)
Glucose-Capillary: 172 mg/dL — ABNORMAL HIGH (ref 70–99)
Glucose-Capillary: 236 mg/dL — ABNORMAL HIGH (ref 70–99)

## 2023-01-04 LAB — BASIC METABOLIC PANEL
Anion gap: 5 (ref 5–15)
BUN: 11 mg/dL (ref 6–20)
CO2: 28 mmol/L (ref 22–32)
Calcium: 7.9 mg/dL — ABNORMAL LOW (ref 8.9–10.3)
Chloride: 102 mmol/L (ref 98–111)
Creatinine, Ser: 0.8 mg/dL (ref 0.61–1.24)
GFR, Estimated: >60 mL/min (ref >60)
Glucose, Bld: 129 mg/dL — ABNORMAL HIGH (ref 70–99)
Potassium: 4 mmol/L (ref 3.5–5.1)
Sodium: 135 mmol/L (ref 135–145)

## 2023-01-04 LAB — TSH: TSH: 2.337 u[IU]/mL (ref 0.350–4.500)

## 2023-01-04 MED ORDER — SODIUM CHLORIDE 0.9 % IV SOLN: 250.0000 mL | INTRAVENOUS | Status: DC | PRN

## 2023-01-04 MED ORDER — SODIUM CHLORIDE 0.9% FLUSH: 3.0000 mL | INTRAVENOUS | Status: DC | PRN

## 2023-01-04 MED ORDER — METOPROLOL TARTRATE 25 MG PO TABS
25.0000 mg | ORAL_TABLET | Freq: Two times a day (BID) | ORAL | Status: DC
  Administered 2023-01-04 – 2023-01-05 (×3): 25 mg via ORAL
  Filled 2023-01-04 (×3): qty 1

## 2023-01-04 MED ORDER — ALUM & MAG HYDROXIDE-SIMETH 200-200-20 MG/5ML PO SUSP: 15.0000 mL | Freq: Four times a day (QID) | ORAL | Status: DC | PRN

## 2023-01-04 MED ORDER — FUROSEMIDE 40 MG PO TABS
40.0000 mg | ORAL_TABLET | Freq: Every day | ORAL | Status: DC
  Administered 2023-01-05 – 2023-01-09 (×5): 40 mg via ORAL
  Filled 2023-01-04 (×5): qty 1

## 2023-01-04 MED ORDER — INSULIN ASPART 100 UNIT/ML IJ SOLN
0.0000 [IU] | Freq: Three times a day (TID) | INTRAMUSCULAR | Status: DC
  Administered 2023-01-04 – 2023-01-05 (×4): 2 [IU] via SUBCUTANEOUS

## 2023-01-04 MED ORDER — MAGNESIUM HYDROXIDE 400 MG/5ML PO SUSP: 30.0000 mL | Freq: Every day | ORAL | Status: DC | PRN

## 2023-01-04 MED ORDER — AMIODARONE HCL IN DEXTROSE 360-4.14 MG/200ML-% IV SOLN
60.0000 mg/h | INTRAVENOUS | Status: AC
  Administered 2023-01-04: 60 mg/h via INTRAVENOUS
  Filled 2023-01-04 (×3): qty 200

## 2023-01-04 MED ORDER — INSULIN DETEMIR 100 UNIT/ML ~~LOC~~ SOLN
25.0000 [IU] | Freq: Every day | SUBCUTANEOUS | Status: DC
  Administered 2023-01-04 – 2023-01-05 (×2): 25 [IU] via SUBCUTANEOUS
  Filled 2023-01-04 (×3): qty 0.25

## 2023-01-04 MED ORDER — POTASSIUM CHLORIDE CRYS ER 10 MEQ PO TBCR
20.0000 meq | EXTENDED_RELEASE_TABLET | Freq: Every day | ORAL | Status: DC
  Administered 2023-01-04 – 2023-01-06 (×3): 20 meq via ORAL
  Filled 2023-01-04 (×6): qty 2

## 2023-01-04 MED ORDER — SODIUM CHLORIDE 0.9% FLUSH
3.0000 mL | Freq: Two times a day (BID) | INTRAVENOUS | Status: DC
  Administered 2023-01-04 – 2023-01-08 (×6): 3 mL via INTRAVENOUS

## 2023-01-04 MED ORDER — AMIODARONE HCL IN DEXTROSE 360-4.14 MG/200ML-% IV SOLN
30.0000 mg/h | INTRAVENOUS | Status: AC
  Administered 2023-01-05 – 2023-01-08 (×7): 30 mg/h via INTRAVENOUS
  Filled 2023-01-04 (×10): qty 200

## 2023-01-04 MED ORDER — AMIODARONE LOAD VIA INFUSION
150.0000 mg | Freq: Once | INTRAVENOUS | Status: AC
  Administered 2023-01-04: 150 mg via INTRAVENOUS
  Filled 2023-01-04: qty 83.34

## 2023-01-04 MED ORDER — ~~LOC~~ CARDIAC SURGERY, PATIENT & FAMILY EDUCATION: Freq: Once | Status: AC

## 2023-01-04 NOTE — Progress Notes (Signed)
2 Days Post-Op Procedure(s) (LRB): CORONARY ARTERY BYPASS GRAFTING (CABG)TIMES FOUR UTILIZING LEFT INTERNAL MAMMARY ARTERY, RIGHT RADIAL ARTERY, AND ENDOSCOPIC VEIN HARVEST OF RIGHT GREATER SAPHENOUS VEIN (N/A) RADIAL ARTERY HARVEST (Right) TRANSESOPHAGEAL ECHOCARDIOGRAM (N/A) Subjective: Feels better, ambulated around unit  Objective: Vital signs in last 24 hours: Temp:  [97.8 F (36.6 C)-98.1 F (36.7 C)] 98 F (36.7 C) (08/24 0810) Pulse Rate:  [73-110] 105 (08/24 0600) Cardiac Rhythm: Normal sinus rhythm (08/24 0400) Resp:  [0-33] 26 (08/24 0600) BP: (107-160)/(57-84) 133/80 (08/24 0600) SpO2:  [89 %-97 %] 95 % (08/24 0600) Arterial Line BP: (141-165)/(57-69) 147/57 (08/23 1300) Weight:  [127.7 kg] 127.7 kg (08/24 0500)  Hemodynamic parameters for last 24 hours: PAP: (26-34)/(10-15) 31/10  Intake/Output from previous day: 08/23 0701 - 08/24 0700 In: 712.2 [P.O.:210; I.V.:100.1; IV Piggyback:402.1] Out: 3615 [Urine:3605; Chest Tube:10] Intake/Output this shift: No intake/output data recorded.  General appearance: alert, cooperative, and no distress Neurologic: intact Heart: tachy, regular Lungs: diminished breath sounds bibasilar Abdomen: normal findings: soft, non-tender  Lab Results: Recent Labs    01/03/23 1725 01/04/23 0352  WBC 9.3 9.5  HGB 12.7* 12.3*  HCT 37.0* 36.8*  PLT 117* 113*   BMET:  Recent Labs    01/03/23 1725 01/04/23 0352  NA 136 135  K 4.1 4.0  CL 103 102  CO2 26 28  GLUCOSE 174* 129*  BUN 13 11  CREATININE 0.77 0.80  CALCIUM 7.9* 7.9*    PT/INR:  Recent Labs    01/02/23 1455  LABPROT 16.2*  INR 1.3*   ABG    Component Value Date/Time   PHART 7.332 (L) 01/02/2023 1849   HCO3 24.4 01/02/2023 1849   TCO2 26 01/02/2023 1849   ACIDBASEDEF 2.0 01/02/2023 1849   O2SAT 85 01/02/2023 1849   CBG (last 3)  Recent Labs    01/04/23 0038 01/04/23 0340 01/04/23 0804  GLUCAP 172* 135* 236*    Assessment/Plan: S/P  Procedure(s) (LRB): CORONARY ARTERY BYPASS GRAFTING (CABG)TIMES FOUR UTILIZING LEFT INTERNAL MAMMARY ARTERY, RIGHT RADIAL ARTERY, AND ENDOSCOPIC VEIN HARVEST OF RIGHT GREATER SAPHENOUS VEIN (N/A) RADIAL ARTERY HARVEST (Right) TRANSESOPHAGEAL ECHOCARDIOGRAM (N/A) POD # 2 NEURO- intact CV- in Sinus tach, Bp OK  Increase Lopressor to 25 mg BID  ASA, statin, will add Plavix prior to DC RESP- Atelectasis- IS RENAL- creatinine normal, lytes Ok  Diuresed well yesterday  PO Lasix, K GI- tolerating diet ENDO- CBG elevated this AM  Levemir 25 U daily + SSI Ac and HS Anemia secondary to ABL- mild Thrombocytopenia- stable Dc central line and Foley Cardiac rehab Transfer to 4E    LOS: 6 days    Loreli Slot 01/04/2023

## 2023-01-04 NOTE — Progress Notes (Signed)
   Rounding Note    Patient Name: [name] Date of Encounter: 01/04/2023  [facility] Cardiologist: Norman Herrlich, MD   Subjective   NAEO. Standing with RN this AM.  Vital Signs    Vitals:   01/04/23 0300 01/04/23 0400 01/04/23 0500 01/04/23 0600  BP: 124/74 (!) 140/79 134/76 133/80  Pulse: 88 96 (!) 101 (!) 105  Resp: (!) 0 (!) 0 (!) 31 (!) 26  Temp:      TempSrc:      SpO2: 95% 96% 95% 95%  Weight:   127.7 kg   Height:        Intake/Output Summary (Last 24 hours) at 01/04/2023 0809 Last data filed at 01/04/2023 0600 Gross per 24 hour  Intake 635.85 ml  Output 3535 ml  Net -2899.15 ml      01/04/2023    5:00 AM 01/03/2023    5:00 AM 01/02/2023    4:34 AM  Last 3 Weights  Weight (lbs) 281 lb 8.4 oz 293 lb 3.4 oz 273 lb 13 oz  Weight (kg) 127.7 kg 133 kg 124.2 kg      Telemetry    ST - Personally Reviewed  ECG    Personally Reviewed  Physical Exam   GEN: No acute distress.  On [place]. Cardiac: tachycardic, regular rhythm, no murmurs, rubs, or gallops.  Respiratory: Clear to auscultation bilaterally. Psych: Normal affect   Assessment & Plan    #CAD #ACS #CABG HD stable this AM. Up and moving with RN around room. Cont ASA 81 Cont statin  #HTN  #DM      Sheria Lang T. Lalla Brothers, MD, [facility], [facility] Cardiac Electrophysiology

## 2023-01-05 ENCOUNTER — Inpatient Hospital Stay (HOSPITAL_COMMUNITY): Payer: 59

## 2023-01-05 DIAGNOSIS — I214 Non-ST elevation (NSTEMI) myocardial infarction: Secondary | ICD-10-CM | POA: Diagnosis not present

## 2023-01-05 DIAGNOSIS — R9431 Abnormal electrocardiogram [ECG] [EKG]: Secondary | ICD-10-CM

## 2023-01-05 LAB — CBC
HCT: 35.3 % — ABNORMAL LOW (ref 39.0–52.0)
Hemoglobin: 12.2 g/dL — ABNORMAL LOW (ref 13.0–17.0)
MCH: 31.7 pg (ref 26.0–34.0)
MCHC: 34.6 g/dL (ref 30.0–36.0)
MCV: 91.7 fL (ref 80.0–100.0)
Platelets: 138 10*3/uL — ABNORMAL LOW (ref 150–400)
RBC: 3.85 MIL/uL — ABNORMAL LOW (ref 4.22–5.81)
RDW: 13.4 % (ref 11.5–15.5)
WBC: 10.4 10*3/uL (ref 4.0–10.5)
nRBC: 0 % (ref 0.0–0.2)

## 2023-01-05 LAB — BASIC METABOLIC PANEL
Anion gap: 7 (ref 5–15)
BUN: 15 mg/dL (ref 6–20)
CO2: 27 mmol/L (ref 22–32)
Calcium: 8.4 mg/dL — ABNORMAL LOW (ref 8.9–10.3)
Chloride: 104 mmol/L (ref 98–111)
Creatinine, Ser: 0.75 mg/dL (ref 0.61–1.24)
GFR, Estimated: >60 mL/min (ref >60)
Glucose, Bld: 136 mg/dL — ABNORMAL HIGH (ref 70–99)
Potassium: 3.7 mmol/L (ref 3.5–5.1)
Sodium: 138 mmol/L (ref 135–145)

## 2023-01-05 LAB — ECHOCARDIOGRAM LIMITED
Calc EF: 56.4 %
Height: 72 in
Single Plane A2C EF: 69.8 %
Single Plane A4C EF: 19.2 %
Weight: 4571.46 oz

## 2023-01-05 LAB — GLUCOSE, CAPILLARY
Glucose-Capillary: 111 mg/dL — ABNORMAL HIGH (ref 70–99)
Glucose-Capillary: 133 mg/dL — ABNORMAL HIGH (ref 70–99)
Glucose-Capillary: 137 mg/dL — ABNORMAL HIGH (ref 70–99)
Glucose-Capillary: 137 mg/dL — ABNORMAL HIGH (ref 70–99)
Glucose-Capillary: 140 mg/dL — ABNORMAL HIGH (ref 70–99)

## 2023-01-05 MED ORDER — POTASSIUM CHLORIDE CRYS ER 20 MEQ PO TBCR
30.0000 meq | EXTENDED_RELEASE_TABLET | Freq: Two times a day (BID) | ORAL | Status: AC
  Administered 2023-01-05: 30 meq via ORAL
  Filled 2023-01-05: qty 1

## 2023-01-05 MED ORDER — AMIODARONE IV BOLUS ONLY 150 MG/100ML
150.0000 mg | Freq: Once | INTRAVENOUS | Status: AC
  Administered 2023-01-05: 150 mg via INTRAVENOUS

## 2023-01-05 MED ORDER — PERFLUTREN LIPID MICROSPHERE
1.0000 mL | INTRAVENOUS | Status: AC | PRN
  Administered 2023-01-05: 8 mL via INTRAVENOUS

## 2023-01-05 MED ORDER — METOPROLOL TARTRATE 25 MG PO TABS
37.5000 mg | ORAL_TABLET | Freq: Three times a day (TID) | ORAL | Status: DC
  Administered 2023-01-05 – 2023-01-07 (×6): 37.5 mg via ORAL
  Filled 2023-01-05 (×6): qty 1

## 2023-01-05 NOTE — Progress Notes (Addendum)
3 Days Post-Op Procedure(s) (LRB): CORONARY ARTERY BYPASS GRAFTING (CABG)TIMES FOUR UTILIZING LEFT INTERNAL MAMMARY ARTERY, RIGHT RADIAL ARTERY, AND ENDOSCOPIC VEIN HARVEST OF RIGHT GREATER SAPHENOUS VEIN (N/A) RADIAL ARTERY HARVEST (Right) TRANSESOPHAGEAL ECHOCARDIOGRAM (N/A) Subjective: Feeling stronger each day  Objective: Vital signs in last 24 hours: Temp:  [97.6 F (36.4 C)-99.4 F (37.4 C)] 98.4 F (36.9 C) (08/25 0753) Pulse Rate:  [99-145] 136 (08/25 0753) Cardiac Rhythm: Atrial fibrillation (08/24 1905) Resp:  [0-28] 20 (08/25 0753) BP: (100-128)/(62-89) 102/70 (08/25 0753) SpO2:  [90 %-97 %] 90 % (08/25 0830) Weight:  [129.6 kg] 129.6 kg (08/25 0626)  Hemodynamic parameters for last 24 hours:    Intake/Output from previous day: 08/24 0701 - 08/25 0700 In: 1051.2 [P.O.:400; I.V.:651.2] Out: 880 [Urine:880] Intake/Output this shift: Total I/O In: 240 [P.O.:240] Out: -   General appearance: alert, cooperative, and no distress Heart: regular rate and rhythm and tachy Lungs: mildly dim in bases Abdomen: soft, non tender Extremities: no edema Wound: incis healing well, right hand n/v intact  Lab Results: Recent Labs    01/04/23 0352 01/05/23 0429  WBC 9.5 10.4  HGB 12.3* 12.2*  HCT 36.8* 35.3*  PLT 113* 138*   BMET:  Recent Labs    01/04/23 0352 01/05/23 0429  NA 135 138  K 4.0 3.7  CL 102 104  CO2 28 27  GLUCOSE 129* 136*  BUN 11 15  CREATININE 0.80 0.75  CALCIUM 7.9* 8.4*    PT/INR:  Recent Labs    01/02/23 1455  LABPROT 16.2*  INR 1.3*   ABG    Component Value Date/Time   PHART 7.332 (L) 01/02/2023 1849   HCO3 24.4 01/02/2023 1849   TCO2 26 01/02/2023 1849   ACIDBASEDEF 2.0 01/02/2023 1849   O2SAT 85 01/02/2023 1849   CBG (last 3)  Recent Labs    01/04/23 1620 01/04/23 2127 01/05/23 0630  GLUCAP 134* 164* 111*    Meds Scheduled Meds:  acetaminophen  1,000 mg Oral Q6H   Or   acetaminophen (TYLENOL) oral liquid 160 mg/5  mL  1,000 mg Per Tube Q6H   aspirin EC  325 mg Oral Daily   atorvastatin  80 mg Oral Daily   bisacodyl  10 mg Oral Daily   Or   bisacodyl  10 mg Rectal Daily   Chlorhexidine Gluconate Cloth  6 each Topical Daily   docusate sodium  200 mg Oral Daily   enoxaparin (LOVENOX) injection  40 mg Subcutaneous QHS   furosemide  40 mg Oral Daily   insulin aspart  0-15 Units Subcutaneous TID WC   insulin detemir  25 Units Subcutaneous Daily   isosorbide mononitrate  30 mg Oral Daily   losartan  25 mg Oral Daily   metoprolol tartrate  25 mg Oral BID   mometasone-formoterol  2 puff Inhalation BID   nicotine  21 mg Transdermal Daily   pantoprazole  40 mg Oral Daily   potassium chloride  20 mEq Oral Daily   sodium chloride flush  3 mL Intravenous Q12H   sodium chloride flush  3 mL Intravenous Q12H   Continuous Infusions:  sodium chloride     sodium chloride 1 mL/hr at 01/04/23 0000   sodium chloride Stopped (01/02/23 1757)   sodium chloride     albumin human     amiodarone 30 mg/hr (01/05/23 0311)   dexmedetomidine (PRECEDEX) IV infusion Stopped (01/02/23 1632)   lactated ringers Stopped (01/03/23 1003)   lactated ringers  phenylephrine (NEO-SYNEPHRINE) Adult infusion Stopped (01/02/23 1431)   PRN Meds:.sodium chloride, sodium chloride, albumin human, albuterol, ALPRAZolam, alum & mag hydroxide-simeth, guaiFENesin-dextromethorphan, magnesium hydroxide, metoprolol tartrate, morphine injection, naphazoline-pheniramine, ondansetron (ZOFRAN) IV, mouth rinse, oxyCODONE, sodium chloride flush, sodium chloride flush  Xrays DG Chest Port 1 View  Result Date: 01/04/2023 CLINICAL DATA:  Postop from CABG. EXAM: PORTABLE CHEST 1 VIEW COMPARISON:  01/03/2023 FINDINGS: Stable cardiomegaly. Prior CABG. Right jugular Cordis is seen in appropriate position. No pneumothorax visualized. Bibasilar atelectasis shows no significant change. IMPRESSION: Stable mild bibasilar atelectasis. No pneumothorax  visualized. Stable cardiomegaly. Electronically Signed   By: Danae Orleans M.D.   On: 01/04/2023 09:27    Assessment/Plan: S/P Procedure(s) (LRB): CORONARY ARTERY BYPASS GRAFTING (CABG)TIMES FOUR UTILIZING LEFT INTERNAL MAMMARY ARTERY, RIGHT RADIAL ARTERY, AND ENDOSCOPIC VEIN HARVEST OF RIGHT GREATER SAPHENOUS VEIN (N/A) RADIAL ARTERY HARVEST (Right) TRANSESOPHAGEAL ECHOCARDIOGRAM (N/A)  POD#3  1 afeb, having afib/flutter w/ RVR, s BP stable 100's-120's- on amio gtt, TSH normal 2 sats good on 5 liters  3 voiding- not measured, weight up about 5 kg if accurate- cont to diurese 4 BS mostly controlled, one reading in 200's- was on glucophage at home, likely resume at d/c,Hg A1c 6.6- currently on levemir/SSI 5 normal renal fxn, K+ 3.7, will give additional dose since on lasix and K+ 3.7 to get to >4.o 6 minor expected ABLA- stable 7 reactive thrombocytopenia - resolving 8 CXR - no significant effus , minor atx, cardiomegally 9 cont pulm hygiene/cardiac rehab as able 10 bmet/Mg++ in am  LOS: 7 days    Rowe Clack PA-C Pager [phone] 01/05/2023 Patient seen and examined, agree with above Will rebolus with amiodarone Echo pending  Viviann Spare C. Dorris Fetch, MD [facility] [phone]

## 2023-01-05 NOTE — Progress Notes (Signed)
   Rounding Note    Patient Name: [name] Date of Encounter: 01/05/2023  [facility] Cardiologist: Norman Herrlich, MD   Subjective   [name]. In rapidly conducting atrial flutter with ventricular rates 130s to 140s today..  Vital Signs    Vitals:   01/05/23 0224 01/05/23 0626 01/05/23 0753 01/05/23 0830  BP: 105/80 125/89 102/70   Pulse: (!) 116 (!) 129 (!) 136   Resp: 18 20 20    Temp:  98.1 F (36.7 C) 98.4 F (36.9 C)   TempSrc:  Oral Oral   SpO2: 92% 93% 90% 90%  Weight:  129.6 kg    Height:        Intake/Output Summary (Last 24 hours) at 01/05/2023 0957 Last data filed at 01/05/2023 0826 Gross per 24 hour  Intake 1291.18 ml  Output 880 ml  Net 411.18 ml      01/05/2023    6:26 AM 01/04/2023    5:00 AM 01/03/2023    5:00 AM  Last 3 Weights  Weight (lbs) 285 lb 11.5 oz 281 lb 8.4 oz 293 lb 3.4 oz  Weight (kg) 129.6 kg 127.7 kg 133 kg      Telemetry    Atrial flutter with ventricular rates 140- Personally Reviewed He was in sinus rhythm as recently as January 04, 2023 at 1835.      ECG    Personally Reviewed  Physical Exam   GEN: No acute distress.  On [place]. Cardiac: tachycardic, regular rhythm, no murmurs, rubs, or gallops.  Respiratory: Clear to auscultation bilaterally. Psych: Normal affect   Assessment & Plan    #Atrial flutter Continue amiodarone 30 mg/hour Increase metoprolol to tartrate to 3 times daily 37.5 mg OAC when felt safe from a surgical perspective.  Defer timing to surgical team. If remains rapidly conducting, will need to consider TEE guided cardioversion.  #CAD #ACS #CABG Cont ASA Cont statin      Sheria Lang T. Lalla Brothers, MD, [facility], [facility] Cardiac Electrophysiology

## 2023-01-05 NOTE — Progress Notes (Signed)
Patient using home CPAP unit independently. Told patient to call if he needed any help. Water reservoir filled and safety check performed.

## 2023-01-05 NOTE — Progress Notes (Signed)
  Echocardiogram 2D Echocardiogram has been performed.  Maren Reamer 01/05/2023, 9:49 AM

## 2023-01-06 ENCOUNTER — Other Ambulatory Visit (HOSPITAL_COMMUNITY): Payer: Self-pay

## 2023-01-06 DIAGNOSIS — I4819 Other persistent atrial fibrillation: Secondary | ICD-10-CM

## 2023-01-06 DIAGNOSIS — I214 Non-ST elevation (NSTEMI) myocardial infarction: Secondary | ICD-10-CM | POA: Diagnosis not present

## 2023-01-06 DIAGNOSIS — I25110 Atherosclerotic heart disease of native coronary artery with unstable angina pectoris: Secondary | ICD-10-CM | POA: Diagnosis not present

## 2023-01-06 DIAGNOSIS — I4891 Unspecified atrial fibrillation: Secondary | ICD-10-CM | POA: Diagnosis not present

## 2023-01-06 DIAGNOSIS — Z951 Presence of aortocoronary bypass graft: Secondary | ICD-10-CM

## 2023-01-06 DIAGNOSIS — E1169 Type 2 diabetes mellitus with other specified complication: Secondary | ICD-10-CM

## 2023-01-06 LAB — BASIC METABOLIC PANEL
Anion gap: 11 (ref 5–15)
BUN: 18 mg/dL (ref 6–20)
CO2: 24 mmol/L (ref 22–32)
Calcium: 8.6 mg/dL — ABNORMAL LOW (ref 8.9–10.3)
Chloride: 104 mmol/L (ref 98–111)
Creatinine, Ser: 0.85 mg/dL (ref 0.61–1.24)
GFR, Estimated: >60 mL/min (ref >60)
Glucose, Bld: 228 mg/dL — ABNORMAL HIGH (ref 70–99)
Potassium: 4 mmol/L (ref 3.5–5.1)
Sodium: 139 mmol/L (ref 135–145)

## 2023-01-06 LAB — GLUCOSE, CAPILLARY
Glucose-Capillary: 126 mg/dL — ABNORMAL HIGH (ref 70–99)
Glucose-Capillary: 111 mg/dL — ABNORMAL HIGH (ref 70–99)
Glucose-Capillary: 124 mg/dL — ABNORMAL HIGH (ref 70–99)
Glucose-Capillary: 133 mg/dL — ABNORMAL HIGH (ref 70–99)

## 2023-01-06 LAB — MAGNESIUM: Magnesium: 1.9 mg/dL (ref 1.7–2.4)

## 2023-01-06 MED ORDER — ASPIRIN 81 MG PO TBEC
81.0000 mg | DELAYED_RELEASE_TABLET | Freq: Every day | ORAL | Status: DC
  Administered 2023-01-06 – 2023-01-09 (×4): 81 mg via ORAL
  Filled 2023-01-06 (×4): qty 1

## 2023-01-06 MED ORDER — AMIODARONE IV BOLUS ONLY 150 MG/100ML: 150.0000 mg | Freq: Once | INTRAVENOUS | Status: DC

## 2023-01-06 MED ORDER — AMIODARONE LOAD VIA INFUSION
150.0000 mg | Freq: Once | INTRAVENOUS | Status: AC
  Administered 2023-01-06: 150 mg via INTRAVENOUS
  Filled 2023-01-06: qty 83.34

## 2023-01-06 MED ORDER — METFORMIN HCL ER 500 MG PO TB24
500.0000 mg | ORAL_TABLET | Freq: Every day | ORAL | Status: DC
  Administered 2023-01-06 – 2023-01-08 (×2): 500 mg via ORAL
  Filled 2023-01-06 (×4): qty 1

## 2023-01-06 MED ORDER — MAGNESIUM OXIDE -MG SUPPLEMENT 400 (240 MG) MG PO TABS
400.0000 mg | ORAL_TABLET | Freq: Once | ORAL | Status: AC
  Administered 2023-01-06: 400 mg via ORAL
  Filled 2023-01-06: qty 1

## 2023-01-06 MED ORDER — APIXABAN 5 MG PO TABS
5.0000 mg | ORAL_TABLET | Freq: Two times a day (BID) | ORAL | Status: DC
  Administered 2023-01-06 – 2023-01-09 (×7): 5 mg via ORAL
  Filled 2023-01-06 (×7): qty 1

## 2023-01-06 NOTE — TOC Benefit Eligibility Note (Signed)
Patient Product/process development scientist completed.    The patient is insured through [facility]. Patient has [employer], may use a copay card, and/or apply for patient assistance if available.    Ran test claim for Eliquis 5 mg and the current 30 day co-pay is $15.00.   This test claim was processed through [facility]- copay amounts may vary at other pharmacies due to pharmacy/plan contracts, or as the patient moves through the different stages of their insurance plan.     Roland Earl, CPHT Pharmacy Technician III Certified Patient Advocate [facility] Patient Advocate Team Direct Number: [phone]  Fax: [phone]

## 2023-01-06 NOTE — Congregational Nurse Program (Signed)
Rounding Note    Patient Name: [name] Date of Encounter: 01/06/2023  [facility] Cardiologist: Norman Herrlich, MD   Subjective   Noting right upper shoulder pain coming from the right side of his chest, not parasternal.  Just a little uncomfortable. No real chest pain; or notable dyspnea.  Just feels tired.  Inpatient Medications    Scheduled Meds:  acetaminophen  1,000 mg Oral Q6H   Or   acetaminophen (TYLENOL) oral liquid 160 mg/5 mL  1,000 mg Per Tube Q6H   apixaban  5 mg Oral BID   aspirin EC  81 mg Oral Daily   atorvastatin  80 mg Oral Daily   bisacodyl  10 mg Oral Daily   Or   bisacodyl  10 mg Rectal Daily   Chlorhexidine Gluconate Cloth  6 each Topical Daily   docusate sodium  200 mg Oral Daily   furosemide  40 mg Oral Daily   isosorbide mononitrate  30 mg Oral Daily   losartan  25 mg Oral Daily   magnesium oxide  400 mg Oral Once   metFORMIN  500 mg Oral Q breakfast   metoprolol tartrate  37.5 mg Oral Q8H   mometasone-formoterol  2 puff Inhalation BID   nicotine  21 mg Transdermal Daily   pantoprazole  40 mg Oral Daily   potassium chloride  20 mEq Oral Daily   sodium chloride flush  3 mL Intravenous Q12H   sodium chloride flush  3 mL Intravenous Q12H   Continuous Infusions:  sodium chloride     sodium chloride 1 mL/hr at 01/04/23 0000   sodium chloride Stopped (01/02/23 1757)   sodium chloride     albumin human     amiodarone 30 mg/hr (01/06/23 0943)   lactated ringers Stopped (01/03/23 1003)   lactated ringers     PRN Meds: sodium chloride, sodium chloride, albumin human, albuterol, ALPRAZolam, alum & mag hydroxide-simeth, guaiFENesin-dextromethorphan, magnesium hydroxide, metoprolol tartrate, naphazoline-pheniramine, ondansetron (ZOFRAN) IV, mouth rinse, oxyCODONE, sodium chloride flush, sodium chloride flush   Vital Signs    Vitals:   01/05/23 2318 01/06/23 0511 01/06/23 0801 01/06/23 0849  BP: 107/79 (!) 124/95 112/80    Pulse: (!) 134 (!) 138    Resp: 16 20 19    Temp: 98.2 F (36.8 C) 98.2 F (36.8 C) 97.6 F (36.4 C)   TempSrc: Axillary Oral Oral   SpO2: 93% 91%  92%  Weight:      Height:       No intake or output data in the 24 hours ending 01/06/23 1111    01/05/2023    6:26 AM 01/04/2023    5:00 AM 01/03/2023    5:00 AM  Last 3 Weights  Weight (lbs) 285 lb 11.5 oz 281 lb 8.4 oz 293 lb 3.4 oz  Weight (kg) 129.6 kg 127.7 kg 133 kg      Telemetry    Afib RVR - Personally Reviewed  ECG    No new study- Personally Reviewed  Physical Exam   GEN: Morbidly obese somewhat uncomfortable appearing, but no acute distress. Neck: No JVD per se, but notable CAD A waves. Cardiac: Rapid rate with irregular regular rhythm.  Distant heart sounds difficult to tell S1 and S2.  No obvious murmurs, rubs, or gallops.  Respiratory: Clear to auscultation bilaterally.  Nonlabored GI: Soft, nontender, non-distended ; protuberant, obese abdomen MS: No edema; No deformity. Neuro:  Nonfocal  Psych: Normal affect   Labs  High Sensitivity Troponin:   Recent Labs  Lab 12/28/22 0612 12/28/22 0747 12/28/22 1417  TROPONINIHS 16 76* 1,078*     Chemistry Recent Labs  Lab 01/02/23 0419 01/02/23 0828 01/03/23 0415 01/03/23 1725 01/04/23 0352 01/05/23 0429 01/06/23 0736  NA 139   < > 136 136 135 138 139  K 4.1   < > 4.0 4.1 4.0 3.7 4.0  CL 104   < > 105 103 102 104 104  CO2 25   < > 25 26 28 27 24   GLUCOSE 130*   < > 146* 174* 129* 136* 228*  BUN 16   < > 12 13 11 15 18   CREATININE 0.83   < > 0.75 0.77 0.80 0.75 0.85  CALCIUM 9.3   < > 7.7* 7.9* 7.9* 8.4* 8.6*  MG  --    < > 2.5* 2.1  --   --  1.9  PROT 7.0  --   --   --   --   --   --   ALBUMIN 4.1  --   --   --   --   --   --   AST 82*  --   --   --   --   --   --   ALT 144*  --   --   --   --   --   --   ALKPHOS 54  --   --   --   --   --   --   BILITOT 1.6*  --   --   --   --   --   --   GFRNONAA >60   < > >60 >60 >60 >60 >60  ANIONGAP  10   < > 6 7 5 7 11    < > = values in this interval not displayed.    Lipids No results for input(s): "CHOL", "TRIG", "HDL", "LABVLDL", "LDLCALC", "CHOLHDL" in the last 168 hours.  Hematology Recent Labs  Lab 01/03/23 1725 01/04/23 0352 01/05/23 0429  WBC 9.3 9.5 10.4  RBC 3.99* 4.01* 3.85*  HGB 12.7* 12.3* 12.2*  HCT 37.0* 36.8* 35.3*  MCV 92.7 91.8 91.7  MCH 31.8 30.7 31.7  MCHC 34.3 33.4 34.6  RDW 13.0 13.2 13.4  PLT 117* 113* 138*   Thyroid  Recent Labs  Lab 01/04/23 1910  TSH 2.337    BNPNo results for input(s): "BNP", "PROBNP" in the last 168 hours.  DDimer No results for input(s): "DDIMER" in the last 168 hours.   Radiology /  Cardiac Studies      Cath 12/30/22:  Severe MV CAD:  CULPRIT: SEVERE ULCERATED ECCENTRIC 95% proximal LAD at small diagonal branch (prior to major 2nd Diag), 30% stenosis at 2nd Diag with 75% ostial diag sidebranch; mid LAD concentric focal 75%; Proximal-mid RCA 95% ulcerated plaque tapering up to 50% (secondary culprit) Proximal LCx 60 to 65% prior to trifurcation into OM1, OM 2 and OM 3. Preserved LVEF with normal EDP (EF estimated 50 to 55%-cannot fully exclude mild anterior wall motion normality but appears to be relatively normal) recommend 2D Echo.    ECHOCARDIOGRAM LIMITED  Result Date: 01/05/2023    ECHOCARDIOGRAM LIMITED REPORT   Patient Name:   [name] Date of Exam: 01/05/2023 Medical Rec #:  [id number]                 Height:       72.0 in Accession #:    [id number]  Weight:       285.7 lb Date of Birth:  Dec 10, 1960                BSA:          2.479 m Patient Age:    61 years                  BP:           102/70 mmHg Patient Gender: M                         HR:           140 bpm. Exam Location:  Inpatient Procedure: Limited Echo, Cardiac Doppler, Limited Color Doppler and Intracardiac            Opacification Agent Indications:    Abnormal ECG R94.31  History:        Patient has prior history of  Echocardiogram examinations, most                 recent 12/29/2022. NSTEMI and CAD, Prior CABG, COPD and Cocaine                 Abuse, Arrythmias:Tachycardia; Risk Factors:Hypertension, Sleep                 Apnea, Dyslipidemia, Current Smoker and Diabetes.  Sonographer:    Aron Baba Referring Phys: [id number] Lanier Prude  Sonographer Comments: Patient is obese and Technically difficult study due to poor echo windows. Image acquisition challenging due to respiratory motion. IMPRESSIONS  1. Left ventricular ejection fraction, by estimation, is 65 to 70%. The left ventricle has normal function. The left ventricle has no regional wall motion abnormalities. Left ventricular diastolic parameters are indeterminate.  2. Right ventricular systolic function is hyperdynamic.  3. No evidence of mitral valve regurgitation.  4. Limited study. Comparison(s): Prior images reviewed side by side. Compared to prior, LV function is much more vigorous. Heart rates have notable increased. FINDINGS  Left Ventricle: Left ventricular ejection fraction, by estimation, is 65 to 70%. The left ventricle has normal function. The left ventricle has no regional wall motion abnormalities. Definity contrast agent was given IV to delineate the left ventricular  endocardial borders. Left ventricular diastolic parameters are indeterminate. Right Ventricle: Right ventricular systolic function is hyperdynamic. Tricuspid Valve: Tricuspid valve regurgitation is not demonstrated. Additional Comments: Spectral Doppler performed. Color Doppler performed.   LV Volumes (MOD) LV vol d, MOD A2C: 103.0 ml LV vol d, MOD A4C: 57.2 ml LV vol s, MOD A2C: 31.1 ml LV vol s, MOD A4C: 46.2 ml LV SV MOD A2C:     71.9 ml LV SV MOD A4C:     57.2 ml LV SV MOD BP:      48.7 ml Riley Lam MD Electronically signed by Riley Lam MD Signature Date/Time: 01/05/2023/10:13:35 AM    Final    DG Chest 2 View  Result Date: 01/05/2023 CLINICAL DATA:   Atelectasis. EXAM: CHEST - 2 VIEW COMPARISON:  One-view chest x-ray 01/04/2023 FINDINGS: The heart is enlarged. Small effusions are present bilaterally, left greater than right. Basilar airspace opacities likely reflect atelectasis. IMPRESSION: 1. Cardiomegaly without failure. 2. Small bilateral pleural effusions, left greater than right. Electronically Signed   By: Marin Roberts M.D.   On: 01/05/2023 09:48      Patient Profile     62 y.o. male with PMH notable for HTN, HLD, DM-2 and  CAD with prior MI along with COPD and OSA who presented with non-STEMI.  Underwent cardiac catheterization revealing multivessel disease and referred for CABG.  Post CABG complicated by A-fib with RVR  Assessment & Plan    Principal Problem:   NSTEMI (non-ST elevated myocardial infarction) (HCC) Active Problems:   Coronary artery disease involving native coronary artery of native heart with unstable angina pectoris (HCC)   S/P CABG x 4   Atrial fibrillation (HCC) - Post Op Aflutter/Fib   Essential hypertension   OSA (obstructive sleep apnea)   Hyperlipidemia associated with type 2 diabetes mellitus (HCC)   Morbid obesity (HCC)   Anxiety disorder   Epilepsy (HCC)   GERD   Low back pain radiating to left lower extremity   COPD mixed type (HCC)   Cocaine abuse (HCC)   Type 2 diabetes mellitus with obesity (HCC)  Principal Problem:   NSTEMI (non-ST elevated myocardial infarction) (HCC) /   Coronary artery disease involving native coronary artery of native heart with unstable angina pectoris (HCC) =>   S/P CABG x 4 (LIMA-LAD, radial-OM, SVG-RCA, SVG-D1) Admitted with non-STEMI, bilateral multivessel disease and stabilized for CABG now POD #4 s/p CABG x 4 Not on aspirin or Plavix because of need for Eliquis with A-fib. On Imdur 30 mg daily along with losartan 25 mg daily.   Also on Lopressor 37.5 mg every 8 hours with plans to consolidate for discharge. Still volume up, on oral Lasix. On high-dose  statin, and now on metformin => will need to consider SGLT2 inhibitor plus or minus GLP-1 agonist prior to discharge.    Atrial fibrillation (HCC) - Post Op Aflutter/Fib (when the A-fib between 8/24-8/25; Remains on IV amiodarone but still quite tachycardic in A-fib.  Originally appeared to be flutter but now more consistent with A-fib. => On 30 mg IV amiodarone infusion and still no cardioversion => will need to convert to p.o. to continue load in the outpatient setting, but for now continue IV until he converts. Will work to try to arrange TEE DCCV in the next day or so. => Currently no spots for tomorrow 01/07/2023, will have on standby in case there are cancellations. Continue to titrate beta-blocker as BP and heart rate tolerate anticipate consolidating beta-blocker on discharge likely to 75 mg twice daily Cardiac surgery has converted to DOAC.  (Eliquis 5 mg twice daily)  Informed Consent   Shared Decision Making/Informed Consent The risks [stroke, cardiac arrhythmias rarely resulting in the need for a temporary or permanent pacemaker, skin irritation or burns, esophageal damage, perforation (1:10,000 risk), bleeding, pharyngeal hematoma as well as other potential complications associated with conscious sedation including aspiration, arrhythmia, respiratory failure and death], benefits (treatment guidance, restoration of normal sinus rhythm, diagnostic support) and alternatives of a transesophageal echocardiogram guided cardioversion were discussed in detail with Mr. Tollefson and he is willing to proceed.       Essential hypertension BP currently stable on current dose of losartan and beta-blocker, should be able to tolerate consolidation of beta-blocker    Hyperlipidemia associated with type 2 diabetes mellitus (HCC) / Type 2 diabetes mellitus with obesity (HCC) On high-dose statin for now-reassess in outpatient On insulin sliding scale along with metformin.  With obesity and CAD, a GLP-1  agonist such as Ozempic or Mounjaro would be reasonable option Would also potentially consider SGLT2 inhibitor    Morbid obesity (HCC)  / OSA (obstructive sleep apnea) CPAP, dietary counseling for weight loss Likely benefit from GLP-1 agonist  For questions or updates, please contact [facility] Please consult [email or web address] for contact info under        Signed, Bryan Lemma, MD  01/06/2023, 11:11 AM

## 2023-01-06 NOTE — Progress Notes (Signed)
CARDIAC REHAB PHASE I   PRE:  Rate/Rhythm: 150 aflutter    BP: sitting 117/78    SpO2: 91-92 RA  MODE:  Ambulation: 430 ft   POST:  Rate/Rhythm: 146 aflutter    BP: sitting 107/70     SpO2: 97 RA  Pt moved legs off bed but then needed min-mod assist to sit up. Discussed rolling to side first using his legs. Stood independently and walked hall independently with RW. Steady, SOB with distance. HR maintained 146-151 aflutter. BP stable. To recliner. Encouraged IS. Will not need RW long.  [id number]   Ethelda Chick BS, ACSM-CEP 01/06/2023 2:10 PM

## 2023-01-06 NOTE — Evaluation (Addendum)
Physical Therapy Evaluation Patient Details Name: [name] MRN: [id number] DOB: 1961-02-17 Today's Date: 01/06/2023  History of Present Illness  Patient is a 62 y/o male admitted 12/28/22 with NSTEMI, underwent cath 8/19 and CABG x 4 on 01/02/23 with R radial artery arvest and TEE.  Developed a-fib/flutter with RVR on 01/04/23.  PMH positive for HTN, HLD, GERD, DM2, COPD, OSA, CAD/MI, cocaine use, LBP and epilepsy.  Clinical Impression  Patient presents with decreased mobility due to pain, limited use of UE's with sternal precautions and decreased activity tolerance.  He was independent prior to admission, retired, living with his wife.  Currently needing min A for bed mobility and CGA for transfers and ambulation with RW.  Feel he will benefit from skilled PT in the acute setting but likely not need follow up PT at d/c.          If plan is discharge home, recommend the following: A little help with walking and/or transfers;A little help with bathing/dressing/bathroom;Help with stairs or ramp for entrance;Assist for transportation   Can travel by private vehicle        Equipment Recommendations None recommended by PT  Recommendations for Other Services       Functional Status Assessment Patient has had a recent decline in their functional status and demonstrates the ability to make significant improvements in function in a reasonable and predictable amount of time.     Precautions / Restrictions Precautions Precautions: Sternal;Fall      Mobility  Bed Mobility Overal bed mobility: Needs Assistance Bed Mobility: Sidelying to Sit   Sidelying to sit: Min assist, HOB elevated       General bed mobility comments: lifting help for trunk    Transfers Overall transfer level: Needs assistance Equipment used: Rolling walker (2 wheels) Transfers: Sit to/from Stand Sit to Stand: Contact guard assist           General transfer comment: assist for balance     Ambulation/Gait Ambulation/Gait assistance: Supervision, Contact guard assist Gait Distance (Feet): 300 Feet Assistive device: Rolling walker (2 wheels) Gait Pattern/deviations: Step-through pattern, Decreased stride length       General Gait Details: using walker in hallway, carrying walker around bed in the room, HR maintained in 140's with ambulation, mild dyspnea, no LOB  Stairs            Wheelchair Mobility     Tilt Bed    Modified Rankin (Stroke Patients Only)       Balance Overall balance assessment: Needs assistance   Sitting balance-Leahy Scale: Good       Standing balance-Leahy Scale: Good Standing balance comment: standing and moving without LOB without UE support (adjusting shorts, walking holding walker, etc)                             Pertinent Vitals/Pain Pain Assessment Pain Assessment: No/denies pain    Home Living Family/patient expects to be discharged to:: Private residence Living Arrangements: Spouse/significant other Available Help at Discharge: Available PRN/intermittently Type of Home: House Home Access: Stairs to enter Entrance Stairs-Rails: Right Entrance Stairs-Number of Steps: 3   Home Layout: One level Home Equipment: None      Prior Function Prior Level of Function : Independent/Modified Independent                     Extremity/Trunk Assessment   Upper Extremity Assessment Upper Extremity Assessment: Overall WFL for tasks  assessed (with sternal precautions; R arm incision from radial havest)    Lower Extremity Assessment Lower Extremity Assessment: Overall WFL for tasks assessed    Cervical / Trunk Assessment Cervical / Trunk Assessment: Other exceptions Cervical / Trunk Exceptions: increased body habitus  Communication   Communication Communication: No apparent difficulties  Cognition Arousal: Alert Behavior During Therapy: WFL for tasks assessed/performed Overall Cognitive Status:  Within Functional Limits for tasks assessed                                          General Comments General comments (skin integrity, edema, etc.): wife present and supportive; states has purchased lift chair for patient thay lays flat so he can sleep there initially; BP 114/94 prior to ambulation 118/74 post ambulation    Exercises     Assessment/Plan    PT Assessment Patient needs continued PT services  PT Problem List Decreased activity tolerance;Decreased mobility;Decreased balance       PT Treatment Interventions DME instruction;Stair training;Gait training;Patient/family education;Functional mobility training;Therapeutic activities;Therapeutic exercise    PT Goals (Current goals can be found in the Care Plan section)  Acute Rehab PT Goals Patient Stated Goal: return to independent PT Goal Formulation: With patient/family Time For Goal Achievement: 01/20/23 Potential to Achieve Goals: Good    Frequency Min 1X/week     Co-evaluation               AM-PAC PT "6 Clicks" Mobility  Outcome Measure Help needed turning from your back to your side while in a flat bed without using bedrails?: A Little Help needed moving from lying on your back to sitting on the side of a flat bed without using bedrails?: A Little Help needed moving to and from a bed to a chair (including a wheelchair)?: A Little Help needed standing up from a chair using your arms (e.g., wheelchair or bedside chair)?: A Little Help needed to walk in hospital room?: A Little Help needed climbing 3-5 steps with a railing? : Total 6 Click Score: 16    End of Session Equipment Utilized During Treatment: Gait belt Activity Tolerance: Patient tolerated treatment well Patient left: in chair;with call bell/phone within reach;with family/visitor present   PT Visit Diagnosis: Difficulty in walking, not elsewhere classified (R26.2)    Time: 1005-1030 PT Time Calculation (min) (ACUTE ONLY):  25 min   Charges:   PT Evaluation $PT Eval Low Complexity: 1 Low PT Treatments $Gait Training: 8-22 mins PT General Charges $$ ACUTE PT VISIT: 1 Visit        Sheran Lawless, PT Acute Rehabilitation Services [phone] 01/06/2023   [name] 01/06/2023, 11:27 AM

## 2023-01-06 NOTE — Progress Notes (Addendum)
[address]       [place] [address]             [phone]      4 Days Post-Op Procedure(s) (LRB): CORONARY ARTERY BYPASS GRAFTING (CABG)TIMES FOUR UTILIZING LEFT INTERNAL MAMMARY ARTERY, RIGHT RADIAL ARTERY, AND ENDOSCOPIC VEIN HARVEST OF RIGHT GREATER SAPHENOUS VEIN (N/A) RADIAL ARTERY HARVEST (Right) TRANSESOPHAGEAL ECHOCARDIOGRAM (N/A) Subjective: Patient states he had a bowel movement this AM, no complaints. States he did not walk yesterday because of his heart rate.   Objective: Vital signs in last 24 hours: Temp:  [96.6 F (35.9 C)-98.7 F (37.1 C)] 98.2 F (36.8 C) (08/26 0511) Pulse Rate:  [134-147] 138 (08/26 0511) Cardiac Rhythm: Atrial flutter (08/25 1900) Resp:  [16-20] 20 (08/26 0511) BP: (96-124)/(70-95) 124/95 (08/26 0511) SpO2:  [90 %-95 %] 91 % (08/26 0511)  Hemodynamic parameters for last 24 hours:    Intake/Output from previous day: 08/25 0701 - 08/26 0700 In: 240 [P.O.:240] Out: 1200 [Urine:1200] Intake/Output this shift: No intake/output data recorded.  General appearance: alert, cooperative, and no distress Neurologic: intact Heart: tachycardia, no murmur Lungs: diminished bibasilar Abdomen: soft, non-tender; bowel sounds normal; no masses,  no organomegaly Extremities: extremities normal, atraumatic, no cyanosis or edema Wound: Clean and dry, no erythema or sign of infection. RUE neurovascularly intact  Lab Results: Recent Labs    01/04/23 0352 01/05/23 0429  WBC 9.5 10.4  HGB 12.3* 12.2*  HCT 36.8* 35.3*  PLT 113* 138*   BMET:  Recent Labs    01/04/23 0352 01/05/23 0429  NA 135 138  K 4.0 3.7  CL 102 104  CO2 28 27  GLUCOSE 129* 136*  BUN 11 15  CREATININE 0.80 0.75  CALCIUM 7.9* 8.4*    PT/INR: No results for input(s): "LABPROT", "INR" in the last 72 hours. ABG    Component Value Date/Time   PHART 7.332 (L) 01/02/2023 1849   HCO3 24.4 01/02/2023 1849   TCO2 26 01/02/2023 1849   ACIDBASEDEF  2.0 01/02/2023 1849   O2SAT 85 01/02/2023 1849   CBG (last 3)  Recent Labs    01/05/23 1651 01/05/23 2116 01/06/23 0544  GLUCAP 137* 140* 126*    Assessment/Plan: S/P Procedure(s) (LRB): CORONARY ARTERY BYPASS GRAFTING (CABG)TIMES FOUR UTILIZING LEFT INTERNAL MAMMARY ARTERY, RIGHT RADIAL ARTERY, AND ENDOSCOPIC VEIN HARVEST OF RIGHT GREATER SAPHENOUS VEIN (N/A) RADIAL ARTERY HARVEST (Right) TRANSESOPHAGEAL ECHOCARDIOGRAM (N/A)  CV: Converted to Afib/aflutter with RVR yesterday, on IV Amiodarone. This AM still in Afib/aflutter with RVR, HR 130-140s. SBP stable, 96-124. Will give another bolus of Amiodarone. On Lopressor 37.5mg  Q8H per cardiology, Losartan 25mg  daily, and Imdur 30mg  daily. Cardiology recommending possible TEE with cardioversion. Echo on 08/25 showed LVEF 65-70% and hyperdynamic right ventricular systolic function. Start Eliquis?   Pulm: Saturating 90-93% on RA, uses CPAP at night for OSA. Encourage IS and ambulation as able.   GI: +BM this AM. Ok diet.   Endo: CBGs controlled on SSI and Levemir. Preop A1C 6.6, restart Metformin.   Renal: UO 1200cc/24hrs recorded. Cr has been stable. Wt +14 lbs, +5lbs from yesterday... unsure if correct. Continue Lasix 40mg  daily.   DVT Prophylaxis: Lovenox, ambulation  ID: Afebrile, leukocytosis resolved  Deconditioning: Patient has not been walking much, will get PT/OT consult.   Dispo: Work on heart rate and rhythm control, will discuss with surgeon and cardiology.    LOS: 8 days    Jenny Reichmann, PA-C 01/06/2023  Patient  seen and examined, agree with findings and plan outlined above Still in flutter with rate ~ 150 Start Eliquis May require DCCV  Salvatore Decent. Dorris Fetch, MD [facility] [phone]

## 2023-01-07 ENCOUNTER — Inpatient Hospital Stay (HOSPITAL_COMMUNITY): Payer: 59

## 2023-01-07 ENCOUNTER — Encounter (HOSPITAL_COMMUNITY)
Admission: EM | Disposition: A | Discharge status: Home / Self Care | Payer: Self-pay | Source: Home / Self Care | Attending: Thoracic Surgery (Cardiothoracic Vascular Surgery)

## 2023-01-07 ENCOUNTER — Inpatient Hospital Stay (HOSPITAL_COMMUNITY): Payer: 59 | Admitting: Anesthesiology

## 2023-01-07 ENCOUNTER — Encounter (HOSPITAL_COMMUNITY): Payer: Self-pay | Admitting: Thoracic Surgery (Cardiothoracic Vascular Surgery)

## 2023-01-07 ENCOUNTER — Other Ambulatory Visit: Payer: Self-pay

## 2023-01-07 DIAGNOSIS — I499 Cardiac arrhythmia, unspecified: Secondary | ICD-10-CM

## 2023-01-07 DIAGNOSIS — I4891 Unspecified atrial fibrillation: Secondary | ICD-10-CM | POA: Diagnosis not present

## 2023-01-07 DIAGNOSIS — Z951 Presence of aortocoronary bypass graft: Secondary | ICD-10-CM | POA: Diagnosis not present

## 2023-01-07 DIAGNOSIS — I4819 Other persistent atrial fibrillation: Secondary | ICD-10-CM | POA: Diagnosis not present

## 2023-01-07 DIAGNOSIS — G4733 Obstructive sleep apnea (adult) (pediatric): Secondary | ICD-10-CM | POA: Diagnosis not present

## 2023-01-07 DIAGNOSIS — I214 Non-ST elevation (NSTEMI) myocardial infarction: Secondary | ICD-10-CM | POA: Diagnosis not present

## 2023-01-07 DIAGNOSIS — E669 Obesity, unspecified: Secondary | ICD-10-CM

## 2023-01-07 HISTORY — PX: CARDIOVERSION: SHX1299

## 2023-01-07 HISTORY — PX: TEE WITHOUT CARDIOVERSION: SHX5443

## 2023-01-07 LAB — GLUCOSE, CAPILLARY
Glucose-Capillary: 123 mg/dL — ABNORMAL HIGH (ref 70–99)
Glucose-Capillary: 108 mg/dL — ABNORMAL HIGH (ref 70–99)
Glucose-Capillary: 187 mg/dL — ABNORMAL HIGH (ref 70–99)
Glucose-Capillary: 145 mg/dL — ABNORMAL HIGH (ref 70–99)

## 2023-01-07 LAB — BASIC METABOLIC PANEL
Anion gap: 10 (ref 5–15)
BUN: 20 mg/dL (ref 6–20)
CO2: 25 mmol/L (ref 22–32)
Calcium: 8.5 mg/dL — ABNORMAL LOW (ref 8.9–10.3)
Chloride: 105 mmol/L (ref 98–111)
Creatinine, Ser: 0.89 mg/dL (ref 0.61–1.24)
GFR, Estimated: >60 mL/min (ref >60)
Glucose, Bld: 125 mg/dL — ABNORMAL HIGH (ref 70–99)
Potassium: 3.7 mmol/L (ref 3.5–5.1)
Sodium: 140 mmol/L (ref 135–145)

## 2023-01-07 SURGERY — CARDIOVERSION
Anesthesia: Monitor Anesthesia Care

## 2023-01-07 MED ORDER — AMIODARONE IV BOLUS ONLY 150 MG/100ML: 150.0000 mg | Freq: Once | INTRAVENOUS | Status: DC

## 2023-01-07 MED ORDER — SODIUM CHLORIDE 0.9 % IV SOLN: INTRAVENOUS | Status: DC

## 2023-01-07 MED ORDER — SODIUM CHLORIDE 0.9 % IV SOLN: INTRAVENOUS | Status: DC | PRN

## 2023-01-07 MED ORDER — METOPROLOL TARTRATE 50 MG PO TABS
50.0000 mg | ORAL_TABLET | Freq: Three times a day (TID) | ORAL | Status: DC
  Administered 2023-01-07 – 2023-01-08 (×3): 50 mg via ORAL
  Filled 2023-01-07 (×3): qty 1

## 2023-01-07 MED ORDER — AMIODARONE LOAD VIA INFUSION
150.0000 mg | Freq: Once | INTRAVENOUS | Status: AC
  Administered 2023-01-07: 150 mg via INTRAVENOUS
  Filled 2023-01-07: qty 83.34

## 2023-01-07 MED ORDER — POTASSIUM CHLORIDE CRYS ER 20 MEQ PO TBCR
30.0000 meq | EXTENDED_RELEASE_TABLET | Freq: Two times a day (BID) | ORAL | Status: AC
  Administered 2023-01-07: 30 meq via ORAL
  Filled 2023-01-07: qty 1

## 2023-01-07 MED ORDER — POTASSIUM CHLORIDE CRYS ER 20 MEQ PO TBCR
20.0000 meq | EXTENDED_RELEASE_TABLET | Freq: Every day | ORAL | Status: DC
  Administered 2023-01-07: 20 meq via ORAL

## 2023-01-07 MED ORDER — PROPOFOL 500 MG/50ML IV EMUL
INTRAVENOUS | Status: DC | PRN
  Administered 2023-01-07: 80 ug/kg/min via INTRAVENOUS

## 2023-01-07 MED ORDER — POTASSIUM CHLORIDE CRYS ER 10 MEQ PO TBCR
10.0000 meq | EXTENDED_RELEASE_TABLET | Freq: Once | ORAL | Status: AC
  Administered 2023-01-07: 10 meq via ORAL

## 2023-01-07 SURGICAL SUPPLY — 1 items: ELECT DEFIB PAD ADLT CADENCE (PAD) ×1 IMPLANT

## 2023-01-07 NOTE — Progress Notes (Addendum)
      [address]       [place] [address]             [phone]        5 Days Post-Op Procedure(s) (LRB): CORONARY ARTERY BYPASS GRAFTING (CABG)TIMES FOUR UTILIZING LEFT INTERNAL MAMMARY ARTERY, RIGHT RADIAL ARTERY, AND ENDOSCOPIC VEIN HARVEST OF RIGHT GREATER SAPHENOUS VEIN (N/A) RADIAL ARTERY HARVEST (Right) TRANSESOPHAGEAL ECHOCARDIOGRAM (N/A)  Subjective: Patient sitting in chair. His only complaint is that he got "super hot" last night and they had to change his sheets.  Objective: Vital signs in last 24 hours: Temp:  [97.5 F (36.4 C)-98.8 F (37.1 C)] 98.4 F (36.9 C) (08/27 0434) Pulse Rate:  [71-154] 154 (08/27 0434) Cardiac Rhythm: Atrial fibrillation (08/26 1900) Resp:  [19-27] 20 (08/27 0434) BP: (102-112)/(70-90) 109/82 (08/27 0434) SpO2:  [92 %-95 %] 95 % (08/27 0434)  Pre op weight 124.2 kg Current Weight  01/05/23 129.6 kg      Intake/Output from previous day: No intake/output data recorded.   Physical Exam:  Cardiovascular: IRRR IRRR Pulmonary: Clear to auscultation bilaterally Abdomen: Soft, non tender, bowel sounds present. Extremities: Mild bilateral lower extremity edema. RUE motor/sensory intact. Wounds: Sternal, RUE, and right LE wounds are all clean and dry.  No erythema or signs of infection.  Lab Results: CBC: Recent Labs    01/05/23 0429  WBC 10.4  HGB 12.2*  HCT 35.3*  PLT 138*   BMET:  Recent Labs    01/05/23 0429 01/06/23 0736  NA 138 139  K 3.7 4.0  CL 104 104  CO2 27 24  GLUCOSE 136* 228*  BUN 15 18  CREATININE 0.75 0.85  CALCIUM 8.4* 8.6*    PT/INR:  Lab Results  Component Value Date   INR 1.3 (H) 01/02/2023   INR 1.0 01/02/2023   INR 0.9 05/22/2016   ABG:  INR: Will add last result for INR, ABG once components are confirmed Will add last 4 CBG results once components are confirmed  Assessment/Plan:  1. CV - S/p NSTEMI. A fib/flutter with rate into the 140's. On Amiodarone drip,  Imdur 30 mg daily, Lopressor 37.5 mg bid, Losartan 25 mg daily, and Apixaban 5 mg bid. Will stop Losartan for now as need to increase Lopressor band with SBP in the low 100's will not be able to give both medications. Will give another Amiodarone bolus. Cardiology trying to arrange TEE/DCCV when able. 2.  Pulmonary - History of OSA and COPD. On room air. Encourage incentive spirometer. 3. Volume Overload - On Lasix 40 mg daily. 4.  Expected post op acute blood loss anemia - H and H this am stable at 12.2 and 35.3 5. Thrombocytopenia-Last platelets up to 138,000 6. CBGs 124/133/123. Pre op HGA1C 6.6. On Metformin XR 500 mg daily. 7. Ambulation limited but aflutter rate. PT evaluated yesterday. 8. Await this am's BMET result  Donielle M ZimmermanPA-C 7:05 AM Patient seen and examined, agree with findings and plan outlined above Remains in A flutter with HR ~150 Hopefully can DCCV today  Callahan Wild C. Dorris Fetch, MD [facility] [phone]

## 2023-01-07 NOTE — Interval H&P Note (Signed)
History and Physical Interval Note:  01/07/2023 1:31 PM  [name]  has presented today for surgery, with the diagnosis of afib.  The various methods of treatment have been discussed with the patient and family. After consideration of risks, benefits and other options for treatment, the patient has consented to  Procedure(s): CARDIOVERSION (N/A) TRANSESOPHAGEAL ECHOCARDIOGRAM (N/A) as a surgical intervention.  The patient's history has been reviewed, patient examined, no change in status, stable for surgery.  I have reviewed the patient's chart and labs.  Questions were answered to the patient's satisfaction.     Chilton Si, MD

## 2023-01-07 NOTE — Progress Notes (Addendum)
Rounding Note    Patient Name: [name] Date of Encounter: 01/07/2023  [facility] Cardiologist: Norman Herrlich, MD   Subjective   Up to the bathroom, no complaints other than rapid HR; Had a lot of sweating last night but no signs symptoms of fever.  Just drenched in sweat.  Otherwise doing fine  Inpatient Medications    Scheduled Meds:  acetaminophen  1,000 mg Oral Q6H   Or   acetaminophen (TYLENOL) oral liquid 160 mg/5 mL  1,000 mg Per Tube Q6H   apixaban  5 mg Oral BID   aspirin EC  81 mg Oral Daily   atorvastatin  80 mg Oral Daily   bisacodyl  10 mg Oral Daily   Or   bisacodyl  10 mg Rectal Daily   Chlorhexidine Gluconate Cloth  6 each Topical Daily   docusate sodium  200 mg Oral Daily   furosemide  40 mg Oral Daily   isosorbide mononitrate  30 mg Oral Daily   metFORMIN  500 mg Oral Q breakfast   metoprolol tartrate  50 mg Oral Q8H   mometasone-formoterol  2 puff Inhalation BID   nicotine  21 mg Transdermal Daily   pantoprazole  40 mg Oral Daily   potassium chloride  30 mEq Oral BID   [START ON 01/08/2023] potassium chloride  20 mEq Oral Daily   sodium chloride flush  3 mL Intravenous Q12H   sodium chloride flush  3 mL Intravenous Q12H   Continuous Infusions:  sodium chloride     sodium chloride 1 mL/hr at 01/04/23 0000   sodium chloride Stopped (01/02/23 1757)   sodium chloride     sodium chloride     albumin human     amiodarone 30 mg/hr (01/06/23 1657)   lactated ringers Stopped (01/03/23 1003)   lactated ringers     PRN Meds: sodium chloride, sodium chloride, albumin human, albuterol, ALPRAZolam, alum & mag hydroxide-simeth, guaiFENesin-dextromethorphan, magnesium hydroxide, metoprolol tartrate, naphazoline-pheniramine, ondansetron (ZOFRAN) IV, mouth rinse, oxyCODONE, sodium chloride flush, sodium chloride flush   Vital Signs    Vitals:   01/06/23 2300 01/07/23 0002 01/07/23 0434 01/07/23 0822  BP: 104/84  109/82 (P) 104/86   Pulse: 71  (!) 154   Resp: 20 (!) 27 20   Temp: 98.8 F (37.1 C)  98.4 F (36.9 C) (P) 98.4 F (36.9 C)  TempSrc: Oral  Oral (P) Oral  SpO2: 95%  95%   Weight:      Height:       No intake or output data in the 24 hours ending 01/07/23 1119    01/05/2023    6:26 AM 01/04/2023    5:00 AM 01/03/2023    5:00 AM  Last 3 Weights  Weight (lbs) 285 lb 11.5 oz 281 lb 8.4 oz 293 lb 3.4 oz  Weight (kg) 129.6 kg 127.7 kg 133 kg      Telemetry    Atrial fibrillation RVR, 150s - Personally Reviewed  Physical Exam   GEN: No acute distress.   Neck: No JVD Cardiac: Tachy-irregularly irregular, no murmurs, rubs, or gallops. Healing sternotomy  Respiratory: Clear to auscultation bilaterally.;  Nonlabored, good air movement GI: Soft, nontender, non-distended  MS: No edema; No deformity. Neuro:  Nonfocal  Psych: Normal affect   Labs    High Sensitivity Troponin:   Recent Labs  Lab 12/28/22 0612 12/28/22 0747 12/28/22 1417  TROPONINIHS 16 76* 1,078*     Chemistry Recent Labs  Lab  01/02/23 0419 01/02/23 4098 01/03/23 0415 01/03/23 1725 01/04/23 0352 01/05/23 0429 01/06/23 0736 01/07/23 0612  NA 139   < > 136 136   < > 138 139 140  K 4.1   < > 4.0 4.1   < > 3.7 4.0 3.7  CL 104   < > 105 103   < > 104 104 105  CO2 25   < > 25 26   < > 27 24 25   GLUCOSE 130*   < > 146* 174*   < > 136* 228* 125*  BUN 16   < > 12 13   < > 15 18 20   CREATININE 0.83   < > 0.75 0.77   < > 0.75 0.85 0.89  CALCIUM 9.3   < > 7.7* 7.9*   < > 8.4* 8.6* 8.5*  MG  --    < > 2.5* 2.1  --   --  1.9  --   PROT 7.0  --   --   --   --   --   --   --   ALBUMIN 4.1  --   --   --   --   --   --   --   AST 82*  --   --   --   --   --   --   --   ALT 144*  --   --   --   --   --   --   --   ALKPHOS 54  --   --   --   --   --   --   --   BILITOT 1.6*  --   --   --   --   --   --   --   GFRNONAA >60   < > >60 >60   < > >60 >60 >60  ANIONGAP 10   < > 6 7   < > 7 11 10    < > = values in this interval not  displayed.    Lipids No results for input(s): "CHOL", "TRIG", "HDL", "LABVLDL", "LDLCALC", "CHOLHDL" in the last 168 hours.  Hematology Recent Labs  Lab 01/03/23 1725 01/04/23 0352 01/05/23 0429  WBC 9.3 9.5 10.4  RBC 3.99* 4.01* 3.85*  HGB 12.7* 12.3* 12.2*  HCT 37.0* 36.8* 35.3*  MCV 92.7 91.8 91.7  MCH 31.8 30.7 31.7  MCHC 34.3 33.4 34.6  RDW 13.0 13.2 13.4  PLT 117* 113* 138*   Thyroid  Recent Labs  Lab 01/04/23 1910  TSH 2.337    BNPNo results for input(s): "BNP", "PROBNP" in the last 168 hours.  DDimer No results for input(s): "DDIMER" in the last 168 hours.   Radiology    No results found.  Cardiac Studies   Cath 12/30/22:  Severe MV CAD:  CULPRIT: SEVERE ULCERATED ECCENTRIC 95% proximal LAD at small diagonal branch (prior to major 2nd Diag), 30% stenosis at 2nd Diag with 75% ostial diag sidebranch; mid LAD concentric focal 75%; Proximal-mid RCA 95% ulcerated plaque tapering up to 50% (secondary culprit) Proximal LCx 60 to 65% prior to trifurcation into OM1, OM 2 and OM 3. Preserved LVEF with normal EDP (EF estimated 50 to 55%-cannot fully exclude mild anterior wall motion normality but appears to be relatively normal) recommend 2D Echo.   Echo: 12/29/2022: EF 55 to 60%.  No RWMA.  Mild to moderate LA dilation.  GR 1 DD.  Unable to assess PAP but normal  RV size and function.  Normal RAP.  Normal aortic and mitral valves.   Patient Profile     62 y.o. male with PMH notable for HTN, HLD, DM-2 and CAD with prior MI along with COPD and OSA who presented with non-STEMI.  Underwent cardiac catheterization revealing multivessel disease and referred for CABG.  Post CABG complicated by A-fib with RVR   Assessment & Plan    NSTEMI -- s/p 4v CABG (LIMA-LAD, radial-OM, SVG-RCA, SVG-D1), working with cardiac rehab -- on ASA 81mg  daily, not plavix with the need for Eliquis --  continue Imdur 30 mg daily along with losartan 25 mg daily, Lopressor 50 mg every 8 hours with  plans to consolidate for discharge, and high dose statin -- remains on oral Lasix  Post op atrial fibrillation/aflutter -- Remains on IV amiodarone but rates are uncontrolled. Has been NPO with plans for TEE/DCCV this afternoon. Would plan to convert to PO amiodarone after DCCV => anticipate at least 1 month of amiodarone after completing oral load.  Would do for 400 twice daily for 1 week followed by 200 mg twice daily for 1 week before going to 200 mg daily maintenance dose. -- metoprolol was increased to 50mg  q8hr this morning and rebolused with amiodarone -- Eliquis 5mg  BID  Essential hypertension -- controlled -- as above, plan to consolidate metoprolol to Toprol on discharge   Hyperlipidemia  -- LDL 84 -- continue high dose statin  DM -- consider the addition of SLGT2i prior to DC -- On insulin sliding scale along with metformin. -- With obesity and CAD, a GLP-1 agonist such as Ozempic or Mounjaro would be reasonable option   Morbid obesity  OSA -- CPAP, dietary counseling for weight loss -- Likely benefit from GLP-1 agonist   Signed, Laverda Page, NP  01/07/2023, 11:19 AM      ATTENDING ATTESTATION  I have seen, examined and evaluated the patient this morning on rounds along with Geoffry Paradise, NP.  After reviewing all the available data and chart, we discussed the patients laboratory, study & physical findings as well as symptoms in detail.  I agree with her findings, examination as well as impression recommendations as per our discussion.    Attending adjustments noted in italics.   Overall doing well.  Remains in A-fib RVR.  Plan for cardioversion hopefully today.  Converting from IV amiodarone to p.o. amiodarone after cardioversion.  Hopefully if he remains in sinus rhythm tomorrow, could plan for discharge.    Marykay Lex, MD, MS Bryan Lemma, M.D., M.S. Interventional Cardiologist  [facility]  Pager # [phone] Phone #  [phone] [address] [place], [place] [address]    For questions or updates, please contact [facility] Please consult [email or web address] for contact info under

## 2023-01-07 NOTE — Anesthesia Preprocedure Evaluation (Signed)
Anesthesia Evaluation  Patient identified by MRN, date of birth, ID band Patient awake    Reviewed: Allergy & Precautions, NPO status , Patient's Chart, lab work & pertinent test results  History of Anesthesia Complications Negative for: history of anesthetic complications  Airway Mallampati: IV  TM Distance: >3 FB Neck ROM: Full    Dental  (+) Dental Advisory Given   Pulmonary sleep apnea , COPD, Current Smoker   breath sounds clear to auscultation       Cardiovascular hypertension, + angina  + CAD, + Past MI and + CABG  + dysrhythmias Atrial Fibrillation  Rhythm:Regular     Neuro/Psych Seizures -,  PSYCHIATRIC DISORDERS Anxiety      Neuromuscular disease    GI/Hepatic ,GERD  ,,(+) Hepatitis -  Endo/Other  diabetes    Renal/GU Renal diseaseLab Results      Component                Value               Date                      NA                       140                 01/07/2023                K                        3.7                 01/07/2023                CO2                      25                  01/07/2023                GLUCOSE                  125 (H)             01/07/2023                BUN                      20                  01/07/2023                CREATININE               0.89                01/07/2023                CALCIUM                  8.5 (L)             01/07/2023                GFR                      95.99  08/08/2022                EGFR                     104                 02/27/2021                GFRNONAA                 >60                 01/07/2023                Musculoskeletal negative musculoskeletal ROS (+)    Abdominal   Peds  Hematology  (+) Blood dyscrasia, anemia Lab Results      Component                Value               Date                      WBC                      10.4                01/05/2023                HGB                       12.2 (L)            01/05/2023                HCT                      35.3 (L)            01/05/2023                MCV                      91.7                01/05/2023                PLT                      138 (L)             01/05/2023              Anesthesia Other Findings   Reproductive/Obstetrics                              Anesthesia Physical Anesthesia Plan  ASA: 4  Anesthesia Plan: MAC   Post-op Pain Management: Minimal or no pain anticipated   Induction: Intravenous  PONV Risk Score and Plan: 1 and Treatment may vary due to age or medical condition  Airway Management Planned: Nasal Cannula, Simple Face Mask and Natural Airway  Additional Equipment: None  Intra-op Plan:   Post-operative Plan:   Informed Consent: I have reviewed the patients History and Physical, chart, labs and discussed the procedure including the risks, benefits and alternatives for the proposed anesthesia with the patient or authorized representative who has indicated his/her understanding and  acceptance.     Dental advisory given  Plan Discussed with:   Anesthesia Plan Comments:         Anesthesia Quick Evaluation

## 2023-01-07 NOTE — Anesthesia Postprocedure Evaluation (Signed)
Anesthesia Post Note  Patient: [name]  Procedure(s) Performed: CORONARY ARTERY BYPASS GRAFTING (CABG)TIMES FOUR UTILIZING LEFT INTERNAL MAMMARY ARTERY, RIGHT RADIAL ARTERY, AND ENDOSCOPIC VEIN HARVEST OF RIGHT GREATER SAPHENOUS VEIN (Chest) RADIAL ARTERY HARVEST (Right) TRANSESOPHAGEAL ECHOCARDIOGRAM     Patient location during evaluation: SICU Anesthesia Type: General Level of consciousness: sedated Pain management: pain level controlled Vital Signs Assessment: post-procedure vital signs reviewed and stable Respiratory status: patient remains intubated per anesthesia plan Cardiovascular status: stable Postop Assessment: no apparent nausea or vomiting Anesthetic complications: no   No notable events documented.  Last Vitals:  Vitals:   01/07/23 0434 01/07/23 0822  BP: 109/82 (P) 104/86  Pulse: (!) 154   Resp: 20   Temp: 36.9 C (P) 36.9 C  SpO2: 95%     Last Pain:  Vitals:   01/07/23 0822  TempSrc: (P) Oral  PainSc:                  Denea Cheaney

## 2023-01-07 NOTE — Transfer of Care (Signed)
Immediate Anesthesia Transfer of Care Note  Patient: [name]  Procedure(s) Performed: CARDIOVERSION TRANSESOPHAGEAL ECHOCARDIOGRAM  Patient Location: Cath Lab  Anesthesia Type:General  Level of Consciousness: drowsy and patient cooperative  Airway & Oxygen Therapy: Patient Spontanous Breathing and Patient connected to nasal cannula oxygen  Post-op Assessment: Report given to RN and Post -op Vital signs reviewed and stable  Post vital signs: Reviewed and stable  Last Vitals:  Vitals Value Taken Time  BP    Temp    Pulse 78 01/07/23 1406  Resp 25 01/07/23 1406  SpO2 94 % 01/07/23 1406  Vitals shown include unfiled device data.  Last Pain:  Vitals:   01/07/23 1404  TempSrc: Temporal  PainSc: 0-No pain      Patients Stated Pain Goal: 0 (12/31/22 2000)  Complications: No notable events documented.

## 2023-01-07 NOTE — CV Procedure (Signed)
Brief TEE NoteElectrical Cardioversion Procedure Note  LVEF 40-45%.  Global hypokinesis. LAA is very small.  No thrombus noted. Patient was tachcyardic in the 160s throughout the study.  For additional details see full report.    Procedure: Electrical Cardioversion Indications:  Atrial Fibrillation  Procedure Details Consent: Risks of procedure as well as the alternatives and risks of each were explained to the (patient/caregiver).  Consent for procedure obtained. Time Out: Verified patient identification, verified procedure, site/side was marked, verified correct patient position, special equipment/implants available, medications/allergies/relevent history reviewed, required imaging and test results available.  Performed  Patient placed on cardiac monitor, pulse oximetry, supplemental oxygen as necessary.  Sedation given:  propofol Pacer pads placed anterior and posterior chest.  Cardioverted 1 time(s).  Cardioverted at 200J.  Evaluation Findings: Post procedure EKG shows: NSR Complications: None Patient did tolerate procedure well.   Chilton Si, MD 01/07/2023, 2:10 PM

## 2023-01-07 NOTE — Progress Notes (Signed)
CARDIAC REHAB PHASE I   PRE:  Rate/Rhythm: 144 a-flutter  BP:  Sitting: 104/86      SaO2: 96 RA  MODE:  Ambulation: 150 ft   POST:  Rate/Rhythm: 155 a-flutter  BP:  Sitting: 110/82      SaO2: 95 RA  Pt siting in chair, feeling well today. Ambulated in hall, pushing pole no walker needed. Pt tolerate well, moving at a slow steady pace, with no dizziness, pain and mild SOB. Returned to bed with call bell and bedside table in reach. Will continue to follow.   0910-1000  Woodroe Chen, RN BSN 01/07/2023 9:48 AM

## 2023-01-07 NOTE — Progress Notes (Signed)
Pt aggravated with amount and frequency of meds being given. Pt frustrated with this RN. Angie T, RN took vitals and agreed to see if pt could go outside for fresh air. Angie and orientee took pt outside. Pt agreeable to take some medication prior to trip outside. Pt has amiodarone gtt running through left forearm and understands to let staff know right away if any discomfort. Pt resting with call bell within reach.  Will continue to monitor.

## 2023-01-07 NOTE — Anesthesia Postprocedure Evaluation (Signed)
Anesthesia Post Note  Patient: Gurvis Saso  Procedure(s) Performed: CARDIOVERSION TRANSESOPHAGEAL ECHOCARDIOGRAM     Patient location during evaluation: Cath Lab Anesthesia Type: MAC Level of consciousness: awake and alert Pain management: pain level controlled Vital Signs Assessment: post-procedure vital signs reviewed and stable Respiratory status: spontaneous breathing, nonlabored ventilation, respiratory function stable and patient connected to nasal cannula oxygen Cardiovascular status: blood pressure returned to baseline and stable Postop Assessment: no apparent nausea or vomiting Anesthetic complications: no   No notable events documented.  Last Vitals:  Vitals:   01/07/23 1410 01/07/23 1415  BP: 134/74 124/76  Pulse: 79 81  Resp: 18 (!) 27  Temp:    SpO2: 94% 93%    Last Pain:  Vitals:   01/07/23 1404  TempSrc: Temporal  PainSc: 0-No pain                 Laquanna Veazey

## 2023-01-07 NOTE — H&P (View-Only) (Signed)
Rounding Note    Patient Name: [name] Date of Encounter: 01/07/2023  [facility] Cardiologist: Norman Herrlich, MD   Subjective   Up to the bathroom, no complaints other than rapid HR; Had a lot of sweating last night but no signs symptoms of fever.  Just drenched in sweat.  Otherwise doing fine  Inpatient Medications    Scheduled Meds:  acetaminophen  1,000 mg Oral Q6H   Or   acetaminophen (TYLENOL) oral liquid 160 mg/5 mL  1,000 mg Per Tube Q6H   apixaban  5 mg Oral BID   aspirin EC  81 mg Oral Daily   atorvastatin  80 mg Oral Daily   bisacodyl  10 mg Oral Daily   Or   bisacodyl  10 mg Rectal Daily   Chlorhexidine Gluconate Cloth  6 each Topical Daily   docusate sodium  200 mg Oral Daily   furosemide  40 mg Oral Daily   isosorbide mononitrate  30 mg Oral Daily   metFORMIN  500 mg Oral Q breakfast   metoprolol tartrate  50 mg Oral Q8H   mometasone-formoterol  2 puff Inhalation BID   nicotine  21 mg Transdermal Daily   pantoprazole  40 mg Oral Daily   potassium chloride  30 mEq Oral BID   [START ON 01/08/2023] potassium chloride  20 mEq Oral Daily   sodium chloride flush  3 mL Intravenous Q12H   sodium chloride flush  3 mL Intravenous Q12H   Continuous Infusions:  sodium chloride     sodium chloride 1 mL/hr at 01/04/23 0000   sodium chloride Stopped (01/02/23 1757)   sodium chloride     sodium chloride     albumin human     amiodarone 30 mg/hr (01/06/23 1657)   lactated ringers Stopped (01/03/23 1003)   lactated ringers     PRN Meds: sodium chloride, sodium chloride, albumin human, albuterol, ALPRAZolam, alum & mag hydroxide-simeth, guaiFENesin-dextromethorphan, magnesium hydroxide, metoprolol tartrate, naphazoline-pheniramine, ondansetron (ZOFRAN) IV, mouth rinse, oxyCODONE, sodium chloride flush, sodium chloride flush   Vital Signs    Vitals:   01/06/23 2300 01/07/23 0002 01/07/23 0434 01/07/23 0822  BP: 104/84  109/82 (P) 104/86   Pulse: 71  (!) 154   Resp: 20 (!) 27 20   Temp: 98.8 F (37.1 C)  98.4 F (36.9 C) (P) 98.4 F (36.9 C)  TempSrc: Oral  Oral (P) Oral  SpO2: 95%  95%   Weight:      Height:       No intake or output data in the 24 hours ending 01/07/23 1119    01/05/2023    6:26 AM 01/04/2023    5:00 AM 01/03/2023    5:00 AM  Last 3 Weights  Weight (lbs) 285 lb 11.5 oz 281 lb 8.4 oz 293 lb 3.4 oz  Weight (kg) 129.6 kg 127.7 kg 133 kg      Telemetry    Atrial fibrillation RVR, 150s - Personally Reviewed  Physical Exam   GEN: No acute distress.   Neck: No JVD Cardiac: Tachy-irregularly irregular, no murmurs, rubs, or gallops. Healing sternotomy  Respiratory: Clear to auscultation bilaterally.;  Nonlabored, good air movement GI: Soft, nontender, non-distended  MS: No edema; No deformity. Neuro:  Nonfocal  Psych: Normal affect   Labs    High Sensitivity Troponin:   Recent Labs  Lab 12/28/22 0612 12/28/22 0747 12/28/22 1417  TROPONINIHS 16 76* 1,078*     Chemistry Recent Labs  Lab  01/02/23 0419 01/02/23 4098 01/03/23 0415 01/03/23 1725 01/04/23 0352 01/05/23 0429 01/06/23 0736 01/07/23 0612  NA 139   < > 136 136   < > 138 139 140  K 4.1   < > 4.0 4.1   < > 3.7 4.0 3.7  CL 104   < > 105 103   < > 104 104 105  CO2 25   < > 25 26   < > 27 24 25   GLUCOSE 130*   < > 146* 174*   < > 136* 228* 125*  BUN 16   < > 12 13   < > 15 18 20   CREATININE 0.83   < > 0.75 0.77   < > 0.75 0.85 0.89  CALCIUM 9.3   < > 7.7* 7.9*   < > 8.4* 8.6* 8.5*  MG  --    < > 2.5* 2.1  --   --  1.9  --   PROT 7.0  --   --   --   --   --   --   --   ALBUMIN 4.1  --   --   --   --   --   --   --   AST 82*  --   --   --   --   --   --   --   ALT 144*  --   --   --   --   --   --   --   ALKPHOS 54  --   --   --   --   --   --   --   BILITOT 1.6*  --   --   --   --   --   --   --   GFRNONAA >60   < > >60 >60   < > >60 >60 >60  ANIONGAP 10   < > 6 7   < > 7 11 10    < > = values in this interval not  displayed.    Lipids No results for input(s): "CHOL", "TRIG", "HDL", "LABVLDL", "LDLCALC", "CHOLHDL" in the last 168 hours.  Hematology Recent Labs  Lab 01/03/23 1725 01/04/23 0352 01/05/23 0429  WBC 9.3 9.5 10.4  RBC 3.99* 4.01* 3.85*  HGB 12.7* 12.3* 12.2*  HCT 37.0* 36.8* 35.3*  MCV 92.7 91.8 91.7  MCH 31.8 30.7 31.7  MCHC 34.3 33.4 34.6  RDW 13.0 13.2 13.4  PLT 117* 113* 138*   Thyroid  Recent Labs  Lab 01/04/23 1910  TSH 2.337    BNPNo results for input(s): "BNP", "PROBNP" in the last 168 hours.  DDimer No results for input(s): "DDIMER" in the last 168 hours.   Radiology    No results found.  Cardiac Studies   Cath 12/30/22:  Severe MV CAD:  CULPRIT: SEVERE ULCERATED ECCENTRIC 95% proximal LAD at small diagonal branch (prior to major 2nd Diag), 30% stenosis at 2nd Diag with 75% ostial diag sidebranch; mid LAD concentric focal 75%; Proximal-mid RCA 95% ulcerated plaque tapering up to 50% (secondary culprit) Proximal LCx 60 to 65% prior to trifurcation into OM1, OM 2 and OM 3. Preserved LVEF with normal EDP (EF estimated 50 to 55%-cannot fully exclude mild anterior wall motion normality but appears to be relatively normal) recommend 2D Echo.   Echo: 12/29/2022: EF 55 to 60%.  No RWMA.  Mild to moderate LA dilation.  GR 1 DD.  Unable to assess PAP but normal  RV size and function.  Normal RAP.  Normal aortic and mitral valves.   Patient Profile     62 y.o. male with PMH notable for HTN, HLD, DM-2 and CAD with prior MI along with COPD and OSA who presented with non-STEMI.  Underwent cardiac catheterization revealing multivessel disease and referred for CABG.  Post CABG complicated by A-fib with RVR   Assessment & Plan    NSTEMI -- s/p 4v CABG (LIMA-LAD, radial-OM, SVG-RCA, SVG-D1), working with cardiac rehab -- on ASA 81mg  daily, not plavix with the need for Eliquis --  continue Imdur 30 mg daily along with losartan 25 mg daily, Lopressor 50 mg every 8 hours with  plans to consolidate for discharge, and high dose statin -- remains on oral Lasix  Post op atrial fibrillation/aflutter -- Remains on IV amiodarone but rates are uncontrolled. Has been NPO with plans for TEE/DCCV this afternoon. Would plan to convert to PO amiodarone after DCCV => anticipate at least 1 month of amiodarone after completing oral load.  Would do for 400 twice daily for 1 week followed by 200 mg twice daily for 1 week before going to 200 mg daily maintenance dose. -- metoprolol was increased to 50mg  q8hr this morning and rebolused with amiodarone -- Eliquis 5mg  BID  Essential hypertension -- controlled -- as above, plan to consolidate metoprolol to Toprol on discharge   Hyperlipidemia  -- LDL 84 -- continue high dose statin  DM -- consider the addition of SLGT2i prior to DC -- On insulin sliding scale along with metformin. -- With obesity and CAD, a GLP-1 agonist such as Ozempic or Mounjaro would be reasonable option   Morbid obesity  OSA -- CPAP, dietary counseling for weight loss -- Likely benefit from GLP-1 agonist   Signed, Laverda Page, NP  01/07/2023, 11:19 AM      ATTENDING ATTESTATION  I have seen, examined and evaluated the patient this morning on rounds along with Geoffry Paradise, NP.  After reviewing all the available data and chart, we discussed the patients laboratory, study & physical findings as well as symptoms in detail.  I agree with her findings, examination as well as impression recommendations as per our discussion.    Attending adjustments noted in italics.   Overall doing well.  Remains in A-fib RVR.  Plan for cardioversion hopefully today.  Converting from IV amiodarone to p.o. amiodarone after cardioversion.  Hopefully if he remains in sinus rhythm tomorrow, could plan for discharge.    Marykay Lex, MD, MS Bryan Lemma, M.D., M.S. Interventional Cardiologist  [facility]  Pager # [phone] Phone #  [phone] [address] [place], [place] [address]    For questions or updates, please contact [facility] Please consult [email or web address] for contact info under

## 2023-01-08 ENCOUNTER — Encounter (HOSPITAL_COMMUNITY): Payer: Self-pay | Admitting: Cardiovascular Disease

## 2023-01-08 LAB — GLUCOSE, CAPILLARY
Glucose-Capillary: 134 mg/dL — ABNORMAL HIGH (ref 70–99)
Glucose-Capillary: 145 mg/dL — ABNORMAL HIGH (ref 70–99)
Glucose-Capillary: 126 mg/dL — ABNORMAL HIGH (ref 70–99)
Glucose-Capillary: 148 mg/dL — ABNORMAL HIGH (ref 70–99)

## 2023-01-08 MED ORDER — AMIODARONE HCL 200 MG PO TABS
400.0000 mg | ORAL_TABLET | Freq: Two times a day (BID) | ORAL | Status: DC
  Administered 2023-01-08 – 2023-01-09 (×3): 400 mg via ORAL
  Filled 2023-01-08 (×3): qty 2

## 2023-01-08 MED ORDER — POTASSIUM CHLORIDE CRYS ER 20 MEQ PO TBCR
40.0000 meq | EXTENDED_RELEASE_TABLET | Freq: Every day | ORAL | Status: DC
  Administered 2023-01-08 – 2023-01-09 (×2): 40 meq via ORAL
  Filled 2023-01-08 (×2): qty 2

## 2023-01-08 MED ORDER — METOPROLOL TARTRATE 50 MG PO TABS
50.0000 mg | ORAL_TABLET | Freq: Two times a day (BID) | ORAL | Status: DC
  Administered 2023-01-08 – 2023-01-09 (×2): 50 mg via ORAL
  Filled 2023-01-08 (×3): qty 1

## 2023-01-08 MED FILL — Mannitol IV Soln 20%: INTRAVENOUS | Qty: 500 | Status: AC

## 2023-01-08 MED FILL — Electrolyte-R (PH 7.4) Solution: INTRAVENOUS | Qty: 4000 | Status: AC

## 2023-01-08 MED FILL — Sodium Bicarbonate IV Soln 8.4%: INTRAVENOUS | Qty: 50 | Status: AC

## 2023-01-08 MED FILL — Heparin Sodium (Porcine) Inj 1000 Unit/ML: INTRAMUSCULAR | Qty: 30 | Status: AC

## 2023-01-08 MED FILL — Lidocaine HCl Local Soln Prefilled Syringe 100 MG/5ML (2%): INTRAMUSCULAR | Qty: 5 | Status: AC

## 2023-01-08 MED FILL — Sodium Chloride IV Soln 0.9%: INTRAVENOUS | Qty: 2000 | Status: AC

## 2023-01-08 NOTE — Evaluation (Signed)
Occupational Therapy Evaluation and Discharge Patient Details Name: [name] MRN: [id number] DOB: 03-18-1961 Today's Date: 01/08/2023   History of Present Illness Patient is a 62 y/o male admitted 12/28/22 with NSTEMI, underwent cath 8/19 and CABG x 4 on 01/02/23 with R radial artery arvest and TEE.  Developed a-fib/flutter with RVR on 01/04/23. On 01/06/23 pt underwent electrical cardioversion secondary to a-fib. PMH positive for HTN, HLD, GERD, DM2, COPD, OSA, CAD/MI, cocaine use, LBP and epilepsy.   Clinical Impression   At baseline, pt is Independent with all ADLs, IADLs, and functional mobility without an AD and drives. Pt now presents with mildly decreased activity tolerance but near baseline PLOF with ADLs and demonstrating good understanding of and adherence to sternal precautions during functional tasks. Pt currently demonstrates ability to complete all ADLs Independent to Contact guard assist while adhering to sternal precautions and performs functional mobility/transfers without an AD with Mod I while adhering to sternal precautions. Pt has a supportive wife who is independent in assisting pt with ADLs and IADLs as needed while sternal precautions remain in place. Pt's VSS on RA throughout session. Pt currently receiving acute skilled PT services to address activity tolerance and ambulation. No further benefit from acute skilled OT services at this time. Post acute discharge, no OT follow up is indicated at this time. OT is signing off.       If plan is discharge home, recommend the following: Assist for transportation;A little help with bathing/dressing/bathroom    Functional Status Assessment  Patient has had a recent decline in their functional status and demonstrates the ability to make significant improvements in function in a reasonable and predictable amount of time. (However, no further benefit from acute skilled OT services at this time.)  Equipment  Recommendations  None recommended by OT    Recommendations for Other Services       Precautions / Restrictions Precautions Precautions: Sternal;Fall Restrictions Weight Bearing Restrictions: Yes RUE Weight Bearing: Non weight bearing LUE Weight Bearing: Non weight bearing      Mobility Bed Mobility Overal bed mobility: Modified Independent Bed Mobility: Sit to Supine, Supine to Sit     Supine to sit: HOB elevated, Modified independent (Device/Increase time) Sit to supine: HOB elevated, Modified independent (Device/Increase time)   General bed mobility comments: Pt demonstrated good carryover of training from previous PT session with pt demonstrating ability to transfer supine <-> sit using log roll technique while adhereing to sternal precautions with Mod I (increased time).    Transfers Overall transfer level: Modified independent Equipment used: None Transfers: Sit to/from Stand, Bed to chair/wheelchair/BSC Sit to Stand: Modified independent (Device/Increase time)     Step pivot transfers: Modified independent (Device/Increase time)     General transfer comment: while adhering to sternal precautions and with mildly increased time.      Balance Overall balance assessment: No apparent balance deficits (not formally assessed)                                         ADL either performed or assessed with clinical judgement   ADL Overall ADL's : Modified independent;Needs assistance/impaired Eating/Feeding: Independent;Sitting   Grooming: Independent;Standing (adhering to sternal precautions)   Upper Body Bathing: Contact guard assist;Sitting (adhering to sternal precautions) Upper Body Bathing Details (indicate cue type and reason): Largely Mod I with assist to wash back dur to sternal precations. Lower Body  Bathing: Contact guard assist;Sit to/from stand (adhering to sternal precautions) Lower Body Bathing Details (indicate cue type and reason):  Largely Mod I with occasional assist required due to sternal precautions. Upper Body Dressing : Modified independent;Sitting (adhering to sternal precautions)   Lower Body Dressing: Contact guard assist;Sit to/from stand (adhering to sternal precautions) Lower Body Dressing Details (indicate cue type and reason): Largely Mod I with occasional assist required to donn socks due to sternal precautions. . Toilet Transfer: Modified Independent;Ambulation;Regular Toilet;Grab bars (adhering to sternal precautions)   Toileting- Clothing Manipulation and Hygiene: Modified independent;Sitting/lateral lean;Sit to/from stand (adhering to sternal precautions)       Functional mobility during ADLs: Modified independent (household distances) General ADL Comments: Pt demonstrates ability to complete all ADLs Independent to Contact guard assist while adhering to sternal precautions with pt's wife independent in assisting.     Vision Baseline Vision/History: 1 Wears glasses (readers) Ability to See in Adequate Light: 0 Adequate (readers) Patient Visual Report: No change from baseline       Perception         Praxis         Pertinent Vitals/Pain Pain Assessment Pain Assessment: No/denies pain     Extremity/Trunk Assessment Upper Extremity Assessment Upper Extremity Assessment: Left hand dominant;Overall WFL for tasks assessed (sternal precautions)   Lower Extremity Assessment Lower Extremity Assessment: Defer to PT evaluation   Cervical / Trunk Assessment Cervical / Trunk Assessment: Other exceptions Cervical / Trunk Exceptions: increased body habitus   Communication Communication Communication: No apparent difficulties   Cognition Arousal: Alert Behavior During Therapy: WFL for tasks assessed/performed Overall Cognitive Status: Within Functional Limits for tasks assessed                                 General Comments: AAOx4 and pleasant throughout session. Pt  demonstrates good understanidng of sternal precautions through teach back and adheres to sternal precautions during funcitonal tasks without cues.     General Comments  VSS on RA throughout session. Pt near baseline PLOF with pt reporting he feels close to baseline and ready to go home. Pt demonstrates good understanding of and adherence to sternal precautions. Pt has a supportive wife who is independent in assisting pt as needed while sternal precautions remain in place.    Exercises     Shoulder Instructions      Home Living Family/patient expects to be discharged to:: Private residence Living Arrangements: Spouse/significant other Available Help at Discharge: Available PRN/intermittently Type of Home: House Home Access: Stairs to enter [employer] of Steps: 3 Entrance Stairs-Rails: Right Home Layout: One level     Bathroom Shower/Tub: Producer, television/film/video: Standard Bathroom Accessibility: No   Home Equipment: Shower seat;Lift chair          Prior Functioning/Environment Prior Level of Function : Independent/Modified Independent             Mobility Comments: Independent, drives ADLs Comments: Independent        OT Problem List:        OT Treatment/Interventions:      OT Goals(Current goals can be found in the care plan section) Acute Rehab OT Goals Patient Stated Goal: To return home OT Goal Formulation: With patient  OT Frequency:      Co-evaluation              AM-PAC OT "6 Clicks" Daily Activity  Outcome Measure Help from another person eating meals?: None Help from another person taking care of personal grooming?: None Help from another person toileting, which includes using toliet, bedpan, or urinal?: None Help from another person bathing (including washing, rinsing, drying)?: None Help from another person to put on and taking off regular upper body clothing?: None Help from another person to put on and taking off  regular lower body clothing?: None 6 Click Score: 24   End of Session Nurse Communication: Mobility status;Other (comment) (No further benefit from acute OT services at this time)  Activity Tolerance: Patient tolerated treatment well;No increased pain Patient left: in chair;with call bell/phone within reach  OT Visit Diagnosis: Other abnormalities of gait and mobility (R26.89)                Time: 1610-9604 OT Time Calculation (min): 17 min Charges:  OT General Charges $OT Visit: 1 Visit OT Evaluation $OT Eval Low Complexity: 1 Low  [address]" M., OTR/L, MA Acute Rehab [phone]   Lendon Colonel 01/08/2023, 11:30 AM

## 2023-01-08 NOTE — Progress Notes (Addendum)
      [address]       [place] [address]             [phone]        1 Day Post-Op Procedure(s) (LRB): CARDIOVERSION (N/A) TRANSESOPHAGEAL ECHOCARDIOGRAM (N/A)  Subjective: Patient had no further "sweating episodes";remains afebrile. His only complaint is he would like to have another bowel movement;offered laxative but he does not want. He also stated "I want to go home"  Objective: Vital signs in last 24 hours: Temp:  [97.4 F (36.3 C)-98.4 F (36.9 C)] 97.4 F (36.3 C) (08/28 0300) Pulse Rate:  [65-160] 65 (08/28 0300) Cardiac Rhythm: Normal sinus rhythm (08/27 1903) Resp:  [14-30] 20 (08/28 0300) BP: (100-141)/(62-87) 116/70 (08/28 0300) SpO2:  [92 %-96 %] 96 % (08/28 0300) FiO2 (%):  [21 %] 21 % (08/27 2006) Weight:  [127.1 kg] 127.1 kg (08/28 0543)  Pre op weight 124.2 kg Current Weight  01/08/23 127.1 kg      Intake/Output from previous day: 08/27 0701 - 08/28 0700 In: 550.4 [P.O.:200; I.V.:350.4] Out: -    Physical Exam:  Cardiovascular: RRR Pulmonary: Clear to auscultation bilaterally Abdomen: Soft, non tender, bowel sounds present. Extremities: Mild bilateral lower extremity edema. RUE motor/sensory intact. Wounds: Sternal, RUE, and right LE wounds are all clean and dry.  No erythema or signs of infection.  Lab Results: CBC: No results for input(s): "WBC", "HGB", "HCT", "PLT" in the last 72 hours.  BMET:  Recent Labs    01/06/23 0736 01/07/23 0612  NA 139 140  K 4.0 3.7  CL 104 105  CO2 24 25  GLUCOSE 228* 125*  BUN 18 20  CREATININE 0.85 0.89  CALCIUM 8.6* 8.5*    PT/INR:  Lab Results  Component Value Date   INR 1.3 (H) 01/02/2023   INR 1.0 01/02/2023   INR 0.9 05/22/2016   ABG:  INR: Will add last result for INR, ABG once components are confirmed Will add last 4 CBG results once components are confirmed  Assessment/Plan:  1. CV - S/p NSTEMI.  Previous A fib/flutter with rate into the 140's. S/p  TEE/DCCV 08/27. SR with HR in the 60's this am. On Amiodarone drip, Imdur 30 mg daily, Lopressor 50 mg tid, and Apixaban 5 mg bid.  Based on BP/HR, will give Lopressor 50 mg bid and now that in SR, will transition to oral Amiodarone.  Will likely start Losartan as outpatient. 2.  Pulmonary - History of OSA and COPD. On room air. Encourage incentive spirometer. 3. Volume Overload - On Lasix 40 mg daily. 4.  Expected post op acute blood loss anemia - Last H and H stable at 12.2 and 35.3 5. Thrombocytopenia-Last platelets up to 138,000 6. CBGs 187/145/134. Pre op HGA1C 6.6. On Metformin XR 500 mg daily. 7. Encourage ambulation now that HR controlled 8.Will discuss disposition with Dr. Khaleelah Yowell;in am as he has not ambulated much (increased HR) and want to ensure rate/rhythm remains stable  Donielle M ZimmermanPA-C 7:05 AM Patient seen and examined, agree with findings and plan outlined above Transition to PO amiodarone Home tomorrow if maintains SR  Valerye Kobus C. Dorris Fetch, MD [facility] [phone]

## 2023-01-08 NOTE — Progress Notes (Signed)
Began discussing smoking cessation with pt. He is determined to quit. Requesting 21 mg nicotine patch at d/c (Meds to bed). Discussed tips and substitutions and gave resource sheet. Will f/u tomorrow for more ed. [phone] Ethelda Chick BS, ACSM-CEP 01/08/2023 2:06 PM

## 2023-01-08 NOTE — Progress Notes (Addendum)
CARDIAC REHAB PHASE I   PRE:  Rate/Rhythm: 67 SR    BP: sitting 113/63    SpO2: 94 RA  MODE:  Ambulation: 450 ft   POST:  Rate/Rhythm: 81 SR with PACs    BP: sitting 101/80     SpO2: 96 RA  Pt in recliner. Stood and ambulated independently pushing IV pole. Some noted SOB on return trip. Maintained NSR. No c/o. Encouraged more walking, IS. Doing well. Can walk independently.  [phone]   Ethelda Chick BS, ACSM-CEP 01/08/2023 10:16 AM

## 2023-01-08 NOTE — Progress Notes (Signed)
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  Developed a-fib/flutter with RVR on 01/04/23. On 01/06/23 pt underwent electrical cardioversion secondary to a-fib. PMH positive for HTN, HLD, GERD, DM2, COPD, OSA, CAD/MI, cocaine use, LBP and epilepsy.    PT Comments  Patient progressing and achieving goals for mobility negotiating steps appropriate for home entry and discussing options for bed mobility.  Walking in hallway earlier today unaided.  Feel no further skilled PT needs at this time.  Will sign off.     If plan is discharge home, recommend the following: Assist for transportation   Can travel by private vehicle        Equipment Recommendations  None recommended by PT    Recommendations for Other Services       Precautions / Restrictions Precautions Precautions: Sternal     Mobility  Bed Mobility               General bed mobility comments: Discussed plans for sleeping in recliner at home    Transfers Overall transfer level: Modified independent Equipment used: None               General transfer comment: no device    Ambulation/Gait Ambulation/Gait assistance: Independent Gait Distance (Feet): 200 Feet Assistive device: None Gait Pattern/deviations: Step-through pattern       General Gait Details: HR in 80's with ambulation, no LOB and no AD used   Stairs Stairs: Yes Stairs assistance: Modified independent (Device/Increase time) Stair Management: One rail Right, Alternating pattern, Forwards Number of Stairs: 3 General stair comments: demonstrating step through technique and denies pain in leg though educated can use step to technique if needed   Wheelchair Mobility     Tilt  Bed    Modified Rankin (Stroke Patients Only)       Balance Overall balance assessment: Independent           Standing balance-Leahy Scale: Good                              Cognition Arousal: Alert Behavior During Therapy: WFL for tasks assessed/performed Overall Cognitive Status: Within Functional Limits for tasks assessed                                          Exercises      General Comments General comments (skin integrity, edema, etc.): VSS on RA with HR in 80's      Pertinent Vitals/Pain Pain Assessment Pain Assessment: No/denies pain    Home Living                          Prior Function            PT Goals (current goals can now be found in the care plan section) Progress towards PT goals: Goals met/education completed, patient discharged from PT    Frequency    Min 1X/week      PT Plan      Co-evaluation              AM-PAC  PT "6 Clicks" Mobility   Outcome Measure  Help needed turning from your back to your side while in a flat bed without using bedrails?: A Little Help needed moving from lying on your back to sitting on the side of a flat bed without using bedrails?: None Help needed moving to and from a bed to a chair (including a wheelchair)?: None Help needed standing up from a chair using your arms (e.g., wheelchair or bedside chair)?: None Help needed to walk in hospital room?: None Help needed climbing 3-5 steps with a railing? : None 6 Click Score: 23    End of Session   Activity Tolerance: Patient tolerated treatment well Patient left: in chair;with call bell/phone within reach   PT Visit Diagnosis: Difficulty in walking, not elsewhere classified (R26.2)     Time: 1610-9604 PT Time Calculation (min) (ACUTE ONLY): 27 min  Charges:    $Gait Training: 8-22 mins $Therapeutic Activity: 8-22 mins PT General Charges $$ ACUTE PT VISIT: 1 Visit                     Sheran Lawless, PT Acute Rehabilitation Services Office:[phone] 01/08/2023    Elray Mcgregor 01/08/2023, 4:31 PM

## 2023-01-09 LAB — GLUCOSE, CAPILLARY: Glucose-Capillary: 125 mg/dL — ABNORMAL HIGH (ref 70–99)

## 2023-01-09 MED ORDER — FUROSEMIDE 40 MG PO TABS: 40.0000 mg | ORAL_TABLET | Freq: Every day | ORAL | 0 refills | Status: DC

## 2023-01-09 MED ORDER — METFORMIN HCL ER 500 MG PO TB24: 500.0000 mg | ORAL_TABLET | Freq: Every day | ORAL | 1 refills | Status: DC

## 2023-01-09 MED ORDER — OXYCODONE HCL 5 MG PO TABS: 5.0000 mg | ORAL_TABLET | Freq: Four times a day (QID) | ORAL | 0 refills | Status: DC | PRN

## 2023-01-09 MED ORDER — POTASSIUM CHLORIDE CRYS ER 20 MEQ PO TBCR: 20.0000 meq | EXTENDED_RELEASE_TABLET | Freq: Every day | ORAL | 0 refills | Status: DC

## 2023-01-09 MED ORDER — METOPROLOL TARTRATE 50 MG PO TABS: 50.0000 mg | ORAL_TABLET | Freq: Two times a day (BID) | ORAL | 1 refills | Status: DC

## 2023-01-09 MED ORDER — AMIODARONE HCL 200 MG PO TABS: ORAL_TABLET | ORAL | 1 refills | Status: DC

## 2023-01-09 MED ORDER — NICOTINE 21 MG/24HR TD PT24: 21.0000 mg | MEDICATED_PATCH | Freq: Every day | TRANSDERMAL | 3 refills | Status: DC

## 2023-01-09 MED ORDER — DICLOFENAC SODIUM 75 MG PO TBEC: 75.0000 mg | DELAYED_RELEASE_TABLET | Freq: Two times a day (BID) | ORAL | 1 refills | Status: DC | PRN

## 2023-01-09 MED ORDER — APIXABAN 5 MG PO TABS: 5.0000 mg | ORAL_TABLET | Freq: Two times a day (BID) | ORAL | 1 refills | Status: DC

## 2023-01-09 MED ORDER — ATORVASTATIN CALCIUM 80 MG PO TABS: 80.0000 mg | ORAL_TABLET | Freq: Every day | ORAL | 1 refills | Status: DC

## 2023-01-09 NOTE — Progress Notes (Signed)
Called pt and discussed restrictions/care, exercise, and CRPII. Discussed ensuring he got nicotine patch. Will refer to [facility].  [phone] Ethelda Chick BS, ACSM-CEP 01/09/2023 9:06 AM

## 2023-01-09 NOTE — Progress Notes (Signed)
RN asked the patient to wait till he will be seen by Cardiac Rehab for education, he refused to wait and he state he got all the needed education

## 2023-01-09 NOTE — Progress Notes (Signed)
Pt discharged to home in stable condition, all discharge instructions, prescriptions and appointments discussed with pt. Pt gives complete verbal understanding of all of the above. Pt transported off unit via wheelchair with staff x1 at chairside to private vehicle to transport home.

## 2023-01-09 NOTE — Plan of Care (Signed)
  Problem: Tissue Perfusion: Goal: Adequacy of tissue perfusion will improve Outcome: Progressing   

## 2023-01-09 NOTE — Progress Notes (Signed)
      [address]       [place] [address]             [phone]        2 Days Post-Op Procedure(s) (LRB): CARDIOVERSION (N/A) TRANSESOPHAGEAL ECHOCARDIOGRAM (N/A)  Subjective: Patient sitting in chair. He had another bowel movement yesterday. He wants to go home.  Objective: Vital signs in last 24 hours: Temp:  [97.5 F (36.4 C)-98.8 F (37.1 C)] 97.9 F (36.6 C) (08/29 0321) Pulse Rate:  [63-112] 63 (08/29 0321) Cardiac Rhythm: Normal sinus rhythm (08/28 2000) Resp:  [16-26] 20 (08/29 0618) BP: (105-141)/(67-91) 115/74 (08/29 0321) SpO2:  [91 %-98 %] 98 % (08/29 0321) FiO2 (%):  [21 %] 21 % (08/28 2029) Weight:  [123.7 kg] 123.7 kg (08/29 0617)  Pre op weight 124.2 kg Current Weight  01/09/23 123.7 kg      Intake/Output from previous day: 08/28 0701 - 08/29 0700 In: 1057.5 [P.O.:960; I.V.:97.5] Out: -    Physical Exam:  Cardiovascular: RRR Pulmonary: Clear to auscultation bilaterally Abdomen: Soft, non tender, bowel sounds present. Extremities: Mild bilateral lower extremity edema. RUE motor/sensory intact. Wounds: Sternal, RUE, and right LE wounds are all clean and dry.  No erythema or signs of infection.  Lab Results: CBC: No results for input(s): "WBC", "HGB", "HCT", "PLT" in the last 72 hours.  BMET:  Recent Labs    01/06/23 0736 01/07/23 0612  NA 139 140  K 4.0 3.7  CL 104 105  CO2 24 25  GLUCOSE 228* 125*  BUN 18 20  CREATININE 0.85 0.89  CALCIUM 8.6* 8.5*    PT/INR:  Lab Results  Component Value Date   INR 1.3 (H) 01/02/2023   INR 1.0 01/02/2023   INR 0.9 05/22/2016   ABG:  INR: Will add last result for INR, ABG once components are confirmed Will add last 4 CBG results once components are confirmed  Assessment/Plan:  1. CV - S/p NSTEMI.  Previous A fib/flutter with rate into the 140's. S/p TEE/DCCV 08/27. Continues to maintain SR with HR in the 70's this am. On Amiodarone 400 mg bid, Imdur 30 mg daily,  Lopressor 50 mg tid, and Apixaban 5 mg bid.   2.  Pulmonary - History of OSA and COPD. On room air. Encourage incentive spirometer. 3. Volume Overload - On Lasix 40 mg daily. 4.  Expected post op acute blood loss anemia - Last H and H stable at 12.2 and 35.3 5. Thrombocytopenia-Last platelets up to 138,000 6. CBGs 126/148/125. Pre op HGA1C 6.6. On Metformin XR 500 mg daily. 7. Encourage ambulation now that HR controlled 8.Discharge  Yanni Ruberg M ZimmermanPA-C 7:01 AM

## 2023-01-10 ENCOUNTER — Telehealth (HOSPITAL_COMMUNITY): Payer: Self-pay

## 2023-01-10 NOTE — Telephone Encounter (Signed)
Per phase I cardiac rehab, fax referral to [place] [facility]. 

## 2023-01-22 ENCOUNTER — Ambulatory Visit: Payer: Self-pay

## 2023-01-22 ENCOUNTER — Telehealth: Payer: Self-pay | Admitting: *Deleted

## 2023-01-22 ENCOUNTER — Other Ambulatory Visit: Payer: Self-pay | Admitting: *Deleted

## 2023-01-22 ENCOUNTER — Encounter: Payer: Self-pay | Admitting: Physician Assistant

## 2023-01-22 ENCOUNTER — Ambulatory Visit: Payer: 59 | Attending: Physician Assistant | Admitting: Physician Assistant

## 2023-01-22 ENCOUNTER — Ambulatory Visit: Payer: 59

## 2023-01-22 VITALS — BP 126/70 | HR 55 | Ht 72.0 in | Wt 276.8 lb

## 2023-01-22 DIAGNOSIS — E785 Hyperlipidemia, unspecified: Secondary | ICD-10-CM

## 2023-01-22 DIAGNOSIS — I25708 Atherosclerosis of coronary artery bypass graft(s), unspecified, with other forms of angina pectoris: Secondary | ICD-10-CM | POA: Diagnosis not present

## 2023-01-22 DIAGNOSIS — R931 Abnormal findings on diagnostic imaging of heart and coronary circulation: Secondary | ICD-10-CM

## 2023-01-22 DIAGNOSIS — I480 Paroxysmal atrial fibrillation: Secondary | ICD-10-CM | POA: Diagnosis not present

## 2023-01-22 DIAGNOSIS — E119 Type 2 diabetes mellitus without complications: Secondary | ICD-10-CM | POA: Diagnosis not present

## 2023-01-22 DIAGNOSIS — I25118 Atherosclerotic heart disease of native coronary artery with other forms of angina pectoris: Secondary | ICD-10-CM

## 2023-01-22 DIAGNOSIS — Z7984 Long term (current) use of oral hypoglycemic drugs: Secondary | ICD-10-CM

## 2023-01-22 DIAGNOSIS — Z951 Presence of aortocoronary bypass graft: Secondary | ICD-10-CM

## 2023-01-22 LAB — ECHO TEE

## 2023-01-22 NOTE — Patient Instructions (Signed)
Medication Instructions:  NO CHANGES *If you need a refill on your cardiac medications before your next appointment, please call your pharmacy*   Lab Work: NO LABS If you have labs (blood work) drawn today and your tests are completely normal, you will receive your results only by: MyChart Message (if you have MyChart) OR A paper copy in the mail If you have any lab test that is abnormal or we need to change your treatment, we will call you to review the results.   Testing/Procedures: NO TESTING   Follow-Up: At [facility], you and your health needs are our priority.  As part of our continuing mission to provide you with exceptional heart care, we have created designated Provider Care Teams.  These Care Teams include your primary Cardiologist (physician) and Advanced Practice Providers (APPs -  Physician Assistants and Nurse Practitioners) who all work together to provide you with the care you need, when you need it.    Your next appointment:   3 month(s)  Provider:   Norman Herrlich, MD

## 2023-01-22 NOTE — Progress Notes (Signed)
Patient contacted that office stating he has drainage coming from his incisions. Patient states the chest tube insertion sites are red and are not healing well. States one of the incisions is draining pus. States his radial artery harvest site is draining clear fluid. Denies fevers, warmth at site. States areas are painful. States incisions are not getting worse but they are not getting any better as well. Patient states he is able to hear clicking noises with ambulation, however he states this does not bother him or cause him any pain. Appt made for patient to be seen by PA today with cxr prior. Patient aware.

## 2023-01-22 NOTE — Telephone Encounter (Signed)
Per [facility], cxr machine is broken, patient unable to get cxr prior to appt with PA. Appt rescheduled for tomorrow 9/12 for patient to be seen and examined. Patient aware and verbalizes understanding.

## 2023-01-22 NOTE — Progress Notes (Signed)
at small diagonal branch (prior to major 2nd Diag), 30% stenosis at 2nd Diag with 75% ostial diag sidebranch; mid LAD concentric focal 75%; Proximal-mid RCA 95% ulcerated plaque tapering up to 50% (secondary culprit) Proximal LCx 60 to 65% prior to trifurcation into OM1, OM 2 and OM 3. Preserved LVEF with normal EDP (EF estimated 50 to 55%-cannot fully exclude mild anterior wall motion normality but appears to be relatively normal) recommend 2D Echo.   RECOMMENDATIONS Consider CVTS consultation for for revascularization as a potential  preferred option over multivessel PCI with least 2 lesions in the LAD and 1 long RCA lesion. If PCI is chosen, would probably only treat the RCA and LAD however CABG would also allow revascularization of diagonal branch and OM's. Restart IV heparin 2 hours after TR band removal Check 2D Echo    Bryan Lemma, MD  Findings Coronary Findings Diagnostic  Dominance: Right  Left Main Vessel was injected. Vessel is normal in caliber. Vessel is angiographically normal.  Left Anterior Descending Vessel was injected. Vessel is normal in caliber and large. Vessel is angiographically normal. Prox LAD lesion is 70% stenosed. Prox LAD to Mid LAD lesion is 95% stenosed. Vessel is the culprit lesion. The lesion is type C, located proximal to the major branch, focal, eccentric and ulcerative. Mid LAD-1 lesion is 30% stenosed with 75% stenosed side branch in 2nd Diag. Mid LAD-2 lesion is 75% stenosed. The lesion is focal and concentric.  First Diagonal Branch Vessel is small in size.  Second Diagonal Branch Vessel is moderate in size.  Third Diagonal Branch Vessel is small in size.  Third Septal Branch Vessel is small in size.  Left Circumflex Vessel was injected. Vessel is normal in caliber and large. Vessel is angiographically normal. Mid Cx lesion is 60% stenosed. The lesion is located proximal to the major branch, discrete and concentric.  Left Posterior Atrioventricular Artery Vessel is small in size.  Right Coronary Artery Vessel was injected. Vessel is large. There is severe diffuse disease throughout the vessel. Prox RCA to Mid RCA lesion is 95% stenosed. The lesion is located proximal to the major branch, segmental, irregular and ulcerative. Mid RCA lesion is 50% stenosed.  Right Posterior Descending Artery Vessel is small in size.  First Right Posterolateral Branch Vessel is small in size.  Second Right Posterolateral Branch Vessel is moderate in  size.  Intervention  No interventions have been documented.     ECHOCARDIOGRAM  ECHOCARDIOGRAM LIMITED 01/05/2023  Narrative ECHOCARDIOGRAM LIMITED REPORT    Patient Name:   [name] Date of Exam: 01/05/2023 Medical Rec #:  [id number]                 Height:       72.0 in Accession #:    [id number]                Weight:       285.7 lb Date of Birth:  July 08, 1960                BSA:          2.479 m Patient Age:    61 years                  BP:           102/70 mmHg Patient Gender: M                         HR:  Cardiology Office Note:  .   Date:  01/24/2023  ID:  [name], DOB 09-18-1960, MRN [id number] PCP: Tresa Garter, MD  [facility] Providers Cardiologist:  Norman Herrlich, MD     History of Present Illness: .   [name] is a 62 y.o. male was past medical history of CAD, hyperlipidemia and DM2.  Patient was recently admitted to the hospital on 12/28/2022 with NSTEMI.  Cardiac catheterization performed on 12/30/2022 demonstrated 70% proximal LAD lesion, 95% proximal to mid LAD lesion, 75% mid LAD lesion, 60% mid left circumflex lesion, 95% proximal to mid RCA lesion, 50% mid RCA lesion, EF 50 to 55%.  Echocardiogram obtained on 12/29/2022 showed EF 55 to 60%, grade 1 DD, trivial MR.  Patient was seen by cardiothoracic surgery and ultimately underwent CABG on 01/02/2023 with LIMA to LAD, radial artery to OM, SVG to distal RCA, SVG to diagonal by Dr. Dorris Fetch.  Pre-CABG Doppler showed 1 to 39% bilateral ICA disease, normal vertebral and subclavian artery flow.  He was extubated early in the evening of the surgery.  He is preop hemoglobin A1c was 6.6.  He developed postop A-fib with RVR and amiodarone was started.  He was started on Eliquis due to persistent nature of the A-fib.  Limited echocardiogram obtained on 01/05/2023 showed EF 65 to 70%, no regional wall motion abnormality.  Patient ultimately underwent successful TEE DCCV on 01/07/2023.  TEE showed EF 40 to 45%, global hypokinesis, no thrombus was seen.  Since discharge, patient has been doing well and ambulating.  He denies any fever or chill.  He has drainage from the trocar site in his abdomen and also in his radial artery incision site as well.  He complaining of a clicking noise in his chest when he walk.  He will need to be seen by CT surgery to evaluate the drainage and make sure sternotomy is well-healed.  He has no heart failure symptoms on exam.  I suspect his amiodarone can be discontinued after 3  months.  Blood pressure and heart rate are very well-controlled.  He can follow-up with Dr. Dulce Sellar in 3 months.  ROS:   He denies chest pain, palpitations, dyspnea, pnd, orthopnea, n, v, dizziness, syncope, edema, weight gain, or early satiety. All other systems reviewed and are otherwise negative except as noted above.    Studies Reviewed: [name]   EKG Interpretation Date/Time:  Wednesday January 22 2023 10:54:50 EDT Ventricular Rate:  55 PR Interval:  178 QRS Duration:  92 QT Interval:  456 QTC Calculation: 436 R Axis:   14  Text Interpretation: Sinus bradycardia T wave inversion in the lateral leads Confirmed by Azalee Course [phone]) on 01/24/2023 10:57:55 PM    Cardiac Studies & Procedures   CARDIAC CATHETERIZATION  CARDIAC CATHETERIZATION 12/30/2022  Narrative   Prox LAD lesion is 70% stenosed.  Prox LAD to Mid LAD lesion is 95% stenosed.   Mid LAD-1 lesion is 30% stenosed with 75% stenosed side branch in 2nd Diag.   Mid LAD-2 lesion is 75% stenosed.   Mid Cx lesion is 60% stenosed.   Prox RCA to Mid RCA lesion is 95% stenosed.  Mid RCA lesion is 50% stenosed.   The left ventricular systolic function is normal.  The left ventricular ejection fraction is 50-55% by visual estimate.   LV end diastolic pressure is normal.   There is no aortic valve stenosis.  POST-CATH FINDINGS Severe MV CAD: CULPRIT: SEVERE ULCERATED ECCENTRIC 95% proximal LAD  at small diagonal branch (prior to major 2nd Diag), 30% stenosis at 2nd Diag with 75% ostial diag sidebranch; mid LAD concentric focal 75%; Proximal-mid RCA 95% ulcerated plaque tapering up to 50% (secondary culprit) Proximal LCx 60 to 65% prior to trifurcation into OM1, OM 2 and OM 3. Preserved LVEF with normal EDP (EF estimated 50 to 55%-cannot fully exclude mild anterior wall motion normality but appears to be relatively normal) recommend 2D Echo.   RECOMMENDATIONS Consider CVTS consultation for for revascularization as a potential  preferred option over multivessel PCI with least 2 lesions in the LAD and 1 long RCA lesion. If PCI is chosen, would probably only treat the RCA and LAD however CABG would also allow revascularization of diagonal branch and OM's. Restart IV heparin 2 hours after TR band removal Check 2D Echo    Bryan Lemma, MD  Findings Coronary Findings Diagnostic  Dominance: Right  Left Main Vessel was injected. Vessel is normal in caliber. Vessel is angiographically normal.  Left Anterior Descending Vessel was injected. Vessel is normal in caliber and large. Vessel is angiographically normal. Prox LAD lesion is 70% stenosed. Prox LAD to Mid LAD lesion is 95% stenosed. Vessel is the culprit lesion. The lesion is type C, located proximal to the major branch, focal, eccentric and ulcerative. Mid LAD-1 lesion is 30% stenosed with 75% stenosed side branch in 2nd Diag. Mid LAD-2 lesion is 75% stenosed. The lesion is focal and concentric.  First Diagonal Branch Vessel is small in size.  Second Diagonal Branch Vessel is moderate in size.  Third Diagonal Branch Vessel is small in size.  Third Septal Branch Vessel is small in size.  Left Circumflex Vessel was injected. Vessel is normal in caliber and large. Vessel is angiographically normal. Mid Cx lesion is 60% stenosed. The lesion is located proximal to the major branch, discrete and concentric.  Left Posterior Atrioventricular Artery Vessel is small in size.  Right Coronary Artery Vessel was injected. Vessel is large. There is severe diffuse disease throughout the vessel. Prox RCA to Mid RCA lesion is 95% stenosed. The lesion is located proximal to the major branch, segmental, irregular and ulcerative. Mid RCA lesion is 50% stenosed.  Right Posterior Descending Artery Vessel is small in size.  First Right Posterolateral Branch Vessel is small in size.  Second Right Posterolateral Branch Vessel is moderate in  size.  Intervention  No interventions have been documented.     ECHOCARDIOGRAM  ECHOCARDIOGRAM LIMITED 01/05/2023  Narrative ECHOCARDIOGRAM LIMITED REPORT    Patient Name:   [name] Date of Exam: 01/05/2023 Medical Rec #:  [id number]                 Height:       72.0 in Accession #:    [id number]                Weight:       285.7 lb Date of Birth:  July 08, 1960                BSA:          2.479 m Patient Age:    61 years                  BP:           102/70 mmHg Patient Gender: M                         HR:  at small diagonal branch (prior to major 2nd Diag), 30% stenosis at 2nd Diag with 75% ostial diag sidebranch; mid LAD concentric focal 75%; Proximal-mid RCA 95% ulcerated plaque tapering up to 50% (secondary culprit) Proximal LCx 60 to 65% prior to trifurcation into OM1, OM 2 and OM 3. Preserved LVEF with normal EDP (EF estimated 50 to 55%-cannot fully exclude mild anterior wall motion normality but appears to be relatively normal) recommend 2D Echo.   RECOMMENDATIONS Consider CVTS consultation for for revascularization as a potential  preferred option over multivessel PCI with least 2 lesions in the LAD and 1 long RCA lesion. If PCI is chosen, would probably only treat the RCA and LAD however CABG would also allow revascularization of diagonal branch and OM's. Restart IV heparin 2 hours after TR band removal Check 2D Echo    Bryan Lemma, MD  Findings Coronary Findings Diagnostic  Dominance: Right  Left Main Vessel was injected. Vessel is normal in caliber. Vessel is angiographically normal.  Left Anterior Descending Vessel was injected. Vessel is normal in caliber and large. Vessel is angiographically normal. Prox LAD lesion is 70% stenosed. Prox LAD to Mid LAD lesion is 95% stenosed. Vessel is the culprit lesion. The lesion is type C, located proximal to the major branch, focal, eccentric and ulcerative. Mid LAD-1 lesion is 30% stenosed with 75% stenosed side branch in 2nd Diag. Mid LAD-2 lesion is 75% stenosed. The lesion is focal and concentric.  First Diagonal Branch Vessel is small in size.  Second Diagonal Branch Vessel is moderate in size.  Third Diagonal Branch Vessel is small in size.  Third Septal Branch Vessel is small in size.  Left Circumflex Vessel was injected. Vessel is normal in caliber and large. Vessel is angiographically normal. Mid Cx lesion is 60% stenosed. The lesion is located proximal to the major branch, discrete and concentric.  Left Posterior Atrioventricular Artery Vessel is small in size.  Right Coronary Artery Vessel was injected. Vessel is large. There is severe diffuse disease throughout the vessel. Prox RCA to Mid RCA lesion is 95% stenosed. The lesion is located proximal to the major branch, segmental, irregular and ulcerative. Mid RCA lesion is 50% stenosed.  Right Posterior Descending Artery Vessel is small in size.  First Right Posterolateral Branch Vessel is small in size.  Second Right Posterolateral Branch Vessel is moderate in  size.  Intervention  No interventions have been documented.     ECHOCARDIOGRAM  ECHOCARDIOGRAM LIMITED 01/05/2023  Narrative ECHOCARDIOGRAM LIMITED REPORT    Patient Name:   [name] Date of Exam: 01/05/2023 Medical Rec #:  [id number]                 Height:       72.0 in Accession #:    [id number]                Weight:       285.7 lb Date of Birth:  July 08, 1960                BSA:          2.479 m Patient Age:    61 years                  BP:           102/70 mmHg Patient Gender: M                         HR:  at small diagonal branch (prior to major 2nd Diag), 30% stenosis at 2nd Diag with 75% ostial diag sidebranch; mid LAD concentric focal 75%; Proximal-mid RCA 95% ulcerated plaque tapering up to 50% (secondary culprit) Proximal LCx 60 to 65% prior to trifurcation into OM1, OM 2 and OM 3. Preserved LVEF with normal EDP (EF estimated 50 to 55%-cannot fully exclude mild anterior wall motion normality but appears to be relatively normal) recommend 2D Echo.   RECOMMENDATIONS Consider CVTS consultation for for revascularization as a potential  preferred option over multivessel PCI with least 2 lesions in the LAD and 1 long RCA lesion. If PCI is chosen, would probably only treat the RCA and LAD however CABG would also allow revascularization of diagonal branch and OM's. Restart IV heparin 2 hours after TR band removal Check 2D Echo    Bryan Lemma, MD  Findings Coronary Findings Diagnostic  Dominance: Right  Left Main Vessel was injected. Vessel is normal in caliber. Vessel is angiographically normal.  Left Anterior Descending Vessel was injected. Vessel is normal in caliber and large. Vessel is angiographically normal. Prox LAD lesion is 70% stenosed. Prox LAD to Mid LAD lesion is 95% stenosed. Vessel is the culprit lesion. The lesion is type C, located proximal to the major branch, focal, eccentric and ulcerative. Mid LAD-1 lesion is 30% stenosed with 75% stenosed side branch in 2nd Diag. Mid LAD-2 lesion is 75% stenosed. The lesion is focal and concentric.  First Diagonal Branch Vessel is small in size.  Second Diagonal Branch Vessel is moderate in size.  Third Diagonal Branch Vessel is small in size.  Third Septal Branch Vessel is small in size.  Left Circumflex Vessel was injected. Vessel is normal in caliber and large. Vessel is angiographically normal. Mid Cx lesion is 60% stenosed. The lesion is located proximal to the major branch, discrete and concentric.  Left Posterior Atrioventricular Artery Vessel is small in size.  Right Coronary Artery Vessel was injected. Vessel is large. There is severe diffuse disease throughout the vessel. Prox RCA to Mid RCA lesion is 95% stenosed. The lesion is located proximal to the major branch, segmental, irregular and ulcerative. Mid RCA lesion is 50% stenosed.  Right Posterior Descending Artery Vessel is small in size.  First Right Posterolateral Branch Vessel is small in size.  Second Right Posterolateral Branch Vessel is moderate in  size.  Intervention  No interventions have been documented.     ECHOCARDIOGRAM  ECHOCARDIOGRAM LIMITED 01/05/2023  Narrative ECHOCARDIOGRAM LIMITED REPORT    Patient Name:   [name] Date of Exam: 01/05/2023 Medical Rec #:  [id number]                 Height:       72.0 in Accession #:    [id number]                Weight:       285.7 lb Date of Birth:  July 08, 1960                BSA:          2.479 m Patient Age:    61 years                  BP:           102/70 mmHg Patient Gender: M                         HR:  at small diagonal branch (prior to major 2nd Diag), 30% stenosis at 2nd Diag with 75% ostial diag sidebranch; mid LAD concentric focal 75%; Proximal-mid RCA 95% ulcerated plaque tapering up to 50% (secondary culprit) Proximal LCx 60 to 65% prior to trifurcation into OM1, OM 2 and OM 3. Preserved LVEF with normal EDP (EF estimated 50 to 55%-cannot fully exclude mild anterior wall motion normality but appears to be relatively normal) recommend 2D Echo.   RECOMMENDATIONS Consider CVTS consultation for for revascularization as a potential  preferred option over multivessel PCI with least 2 lesions in the LAD and 1 long RCA lesion. If PCI is chosen, would probably only treat the RCA and LAD however CABG would also allow revascularization of diagonal branch and OM's. Restart IV heparin 2 hours after TR band removal Check 2D Echo    Bryan Lemma, MD  Findings Coronary Findings Diagnostic  Dominance: Right  Left Main Vessel was injected. Vessel is normal in caliber. Vessel is angiographically normal.  Left Anterior Descending Vessel was injected. Vessel is normal in caliber and large. Vessel is angiographically normal. Prox LAD lesion is 70% stenosed. Prox LAD to Mid LAD lesion is 95% stenosed. Vessel is the culprit lesion. The lesion is type C, located proximal to the major branch, focal, eccentric and ulcerative. Mid LAD-1 lesion is 30% stenosed with 75% stenosed side branch in 2nd Diag. Mid LAD-2 lesion is 75% stenosed. The lesion is focal and concentric.  First Diagonal Branch Vessel is small in size.  Second Diagonal Branch Vessel is moderate in size.  Third Diagonal Branch Vessel is small in size.  Third Septal Branch Vessel is small in size.  Left Circumflex Vessel was injected. Vessel is normal in caliber and large. Vessel is angiographically normal. Mid Cx lesion is 60% stenosed. The lesion is located proximal to the major branch, discrete and concentric.  Left Posterior Atrioventricular Artery Vessel is small in size.  Right Coronary Artery Vessel was injected. Vessel is large. There is severe diffuse disease throughout the vessel. Prox RCA to Mid RCA lesion is 95% stenosed. The lesion is located proximal to the major branch, segmental, irregular and ulcerative. Mid RCA lesion is 50% stenosed.  Right Posterior Descending Artery Vessel is small in size.  First Right Posterolateral Branch Vessel is small in size.  Second Right Posterolateral Branch Vessel is moderate in  size.  Intervention  No interventions have been documented.     ECHOCARDIOGRAM  ECHOCARDIOGRAM LIMITED 01/05/2023  Narrative ECHOCARDIOGRAM LIMITED REPORT    Patient Name:   [name] Date of Exam: 01/05/2023 Medical Rec #:  [id number]                 Height:       72.0 in Accession #:    [id number]                Weight:       285.7 lb Date of Birth:  July 08, 1960                BSA:          2.479 m Patient Age:    61 years                  BP:           102/70 mmHg Patient Gender: M                         HR:

## 2023-01-23 ENCOUNTER — Ambulatory Visit (INDEPENDENT_AMBULATORY_CARE_PROVIDER_SITE_OTHER): Payer: Self-pay | Admitting: Physician Assistant

## 2023-01-23 ENCOUNTER — Ambulatory Visit
Admission: RE | Admit: 2023-01-23 | Discharge: 2023-01-23 | Disposition: A | Discharge status: Home / Self Care | Payer: 59 | Source: Ambulatory Visit | Attending: Thoracic Surgery (Cardiothoracic Vascular Surgery) | Admitting: Thoracic Surgery (Cardiothoracic Vascular Surgery)

## 2023-01-23 VITALS — BP 122/63 | HR 58 | Resp 20 | Ht 72.0 in | Wt 276.0 lb

## 2023-01-23 DIAGNOSIS — Z951 Presence of aortocoronary bypass graft: Secondary | ICD-10-CM

## 2023-01-23 MED ORDER — CEFDINIR 300 MG PO CAPS: 300.0000 mg | ORAL_CAPSULE | Freq: Two times a day (BID) | ORAL | 0 refills | Status: AC

## 2023-01-23 NOTE — Patient Instructions (Signed)
Continue to observe sternal precautions with no lifting, pushing, or pulling greater than 15 pounds for another 6 weeks  You may resume driving  Continue your medications as prescribed but decrease the amiodarone to 200 mg once daily.  Follow-up in the office in 1 week for wound check.

## 2023-01-23 NOTE — Progress Notes (Signed)
[address]       [name] [address]             [phone]       HPI: [name] is a 62 year old male with a past history notable for coronary artery disease with recent non-ST elevation myocardial infarction, hypertension, type 2 diabetes mellitus, obesity, and history of cocaine abuse.  He is status post CABG x 4 on 01/02/2023 by Dr. Dorris Fetch with a left internal mammary artery grafted to the left anterior descending coronary artery.  The right radial artery was grafted to the obtuse marginal coronary artery and separate saphenous vein grafts were placed to the first diagonal and right coronary arteries.  During the postoperative Chandler, he made a progressive and satisfactory recovery.  He developed atrial flutter with rapid ventricular conduction with rates in the 140s.  He was evaluated by the electrophysiology team and after a transesophageal echo to assure there was no atrial clot, he underwent DC cardioversion on 01/07/2023.  This was successful in converting him back to sinus rhythm.  He was discharged to home 2 days later on 01/09/2023 in stable condition.  He returns today for scheduled follow-up.  He was seen by Mr. Nathan Course, PA-C yesterday for cardiology follow-up.  EKG performed at that time showed sinus rhythm with a rate in the mid 50s. Since hospital discharge the patient reports he has been having some drainage from one of the chest tube insertion sites and also from the right radial harvest incision. Overall, [name] feels he is making a progressive recovery.  He walks several times a day around [place] pond in his backyard.  He did not use any narcotic analgesics after discharge and only used Tylenol for a few days after he got home.  He denies shortness of breath. He noticed redness and crusty scabs on his right arm incision and on the chest tube sites there were days ago.  He said the redness is already improving.  He is not having any pain in  any of the sites and has not had fever.  Current Outpatient Medications  Medication Sig Dispense Refill   ALPRAZolam (XANAX) 1 MG tablet TAKE 1 TABLET BY MOUTH 2 TIMES DAILY AS NEEDED. 60 tablet 1   amiodarone (PACERONE) 200 MG tablet Take 400 mg orally two times daily for 2 days;then take 200 mg orally two times daily for 10 days;then take 200 mg orally daily thereafter 60 tablet 1   apixaban (ELIQUIS) 5 MG TABS tablet Take 1 tablet (5 mg total) by mouth 2 (two) times daily. 60 tablet 1   aspirin EC 81 MG tablet Take 81 mg by mouth daily. Swallow whole.     atorvastatin (LIPITOR) 80 MG tablet Take 1 tablet (80 mg total) by mouth daily. 30 tablet 1   Blood Glucose Monitoring Suppl (ONE TOUCH ULTRA 2) w/Device KIT Use to check blood sugars twice a day 1 kit 0   budesonide-formoterol (SYMBICORT) 160-4.5 MCG/ACT inhaler INHALE 2 PUFS INTO THE LUNGS TWICE A DAY 10.2 each 11   [START ON 03/10/2024] diclofenac (VOLTAREN) 75 MG EC tablet Take 1 tablet (75 mg total) by mouth 2 (two) times daily as needed. Take 75 mg by mouth 2 (two) times daily as needed. 180 tablet 1   furosemide (LASIX) 40 MG tablet Take 1 tablet (40 mg total) by mouth daily. For 5 days then stop. 5 tablet 0   glucose blood (ONETOUCH ULTRA)  test strip Use to check blood sugars twice a day 100 each 12   isosorbide mononitrate (IMDUR) 30 MG 24 hr tablet Take 1 tablet (30 mg total) by mouth daily. 90 tablet 3   metFORMIN (GLUCOPHAGE-XR) 500 MG 24 hr tablet Take 1 tablet (500 mg total) by mouth daily with breakfast. 30 tablet 1   metoprolol tartrate (LOPRESSOR) 50 MG tablet Take 1 tablet (50 mg total) by mouth 2 (two) times daily. 60 tablet 1   nicotine (NICODERM CQ - DOSED IN MG/24 HOURS) 21 mg/24hr patch Place 1 patch (21 mg total) onto the skin daily. 28 patch 3   OneTouch UltraSoft 2 Lancets MISC 1 Lancet by Does not apply route 2 (two) times daily. 100 each 3   oxyCODONE (OXY IR/ROXICODONE) 5 MG immediate release tablet Take 1 tablet  (5 mg total) by mouth every 6 (six) hours as needed for severe pain. 30 tablet 0   potassium chloride SA (KLOR-CON M) 20 MEQ tablet Take 1 tablet (20 mEq total) by mouth daily. For 5 days then stop. 5 tablet 0   No current facility-administered medications for this visit.    Physical Exam: Vital signs BP 122/63 Pulse 58 and regular Respirations 20 SpO2 95% on room air  General: [name] appears well.  Unassisted with a steady gait.  No distress. Heart: Regular rate and rhythm Chest: Normal work of breathing on room air.  The sternotomy incision has a few areas centrally have separated superficially.  They remain clean and dry with crusty scabs along the edges.  Carefully palpated along the entire length of the incision and there was no purulence at all.  Sternum is stable. The chest tube insertion sites have separated as well but they are clean and dry.  The chest x-ray was reviewed and has no complicating features. Extremities: No peripheral edema   Diagnostic Tests: CLINICAL DATA:  S/p CABG 01/02/23, heart attack 12/28/22. Pt feels a clicking when he walks mid chest, no other chest complaints today. Smoker   EXAM: CHEST - 2 VIEW   COMPARISON:  Chest x-ray 01/04/2023, chest x-ray 01/05/2019, CT chest 03/01/2021   FINDINGS: The heart and mediastinal contours are unchanged.   No focal consolidation. No pulmonary edema. Nonspecific blunting of the right costophrenic angle. No significant pleural effusion. No pneumothorax.   No acute osseous abnormality.  Sternotomy wires are intact.   IMPRESSION: No active cardiopulmonary disease.     Electronically Signed   By: Tish Frederickson M.D.   On: 01/23/2023 13:12    Impression / Plan: [name] is making a progressive and satisfactory recovery following coronary bypass grafting about 1 month ago.  He has had some minor superficial separation of the sternotomy incision, the chest tube sites, along with the segments of  the right radial artery harvest incision.  None of these appear to be infected.  There was some mild erythema along the edges of the radial incision that is a little worrisome but he said this is improving.  Since he has a diabetic, I think it would be worthwhile to have him on a broad-spectrum antibiotic for 1 week and then recheck his wounds here in the office.   He agrees with this plan.  We again discussed sternal precautions reviewed his medications.  He thought he was probably still taking the amiodarone at 200 mg twice daily and we discussed reducing this down to just 200 mg once a day since he has had no evidence of any recurrent  atrial fibrillation or flutter.  A prescription for cefdinir 300 mg twice daily for 7 days was sent to the [facility] pharmacy in [place] per his request.  Leary Roca, PA-C [facility] [phone]

## 2023-01-28 ENCOUNTER — Ambulatory Visit: Payer: 59

## 2023-01-30 ENCOUNTER — Ambulatory Visit: Payer: 59

## 2023-01-31 ENCOUNTER — Other Ambulatory Visit: Payer: Self-pay | Admitting: Physician Assistant

## 2023-02-03 ENCOUNTER — Encounter: Payer: Self-pay | Admitting: Internal Medicine

## 2023-02-03 ENCOUNTER — Telehealth (HOSPITAL_COMMUNITY): Payer: Self-pay

## 2023-02-03 ENCOUNTER — Ambulatory Visit: Payer: 59 | Admitting: Internal Medicine

## 2023-02-03 VITALS — BP 120/80 | HR 59 | Temp 98.6°F | Ht 72.0 in | Wt 277.0 lb

## 2023-02-03 DIAGNOSIS — I4819 Other persistent atrial fibrillation: Secondary | ICD-10-CM | POA: Diagnosis not present

## 2023-02-03 DIAGNOSIS — F419 Anxiety disorder, unspecified: Secondary | ICD-10-CM | POA: Diagnosis not present

## 2023-02-03 DIAGNOSIS — Z7984 Long term (current) use of oral hypoglycemic drugs: Secondary | ICD-10-CM

## 2023-02-03 DIAGNOSIS — E785 Hyperlipidemia, unspecified: Secondary | ICD-10-CM | POA: Diagnosis not present

## 2023-02-03 DIAGNOSIS — I10 Essential (primary) hypertension: Secondary | ICD-10-CM

## 2023-02-03 DIAGNOSIS — E1169 Type 2 diabetes mellitus with other specified complication: Secondary | ICD-10-CM

## 2023-02-03 DIAGNOSIS — I25110 Atherosclerotic heart disease of native coronary artery with unstable angina pectoris: Secondary | ICD-10-CM

## 2023-02-03 DIAGNOSIS — F17200 Nicotine dependence, unspecified, uncomplicated: Secondary | ICD-10-CM

## 2023-02-03 DIAGNOSIS — E669 Obesity, unspecified: Secondary | ICD-10-CM | POA: Diagnosis not present

## 2023-02-03 LAB — CBC WITH DIFFERENTIAL/PLATELET
Basophils Absolute: 0 10*3/uL (ref 0.0–0.1)
Basophils Relative: 0.7 % (ref 0.0–3.0)
Eosinophils Absolute: 0.3 10*3/uL (ref 0.0–0.7)
Eosinophils Relative: 4.7 % (ref 0.0–5.0)
HCT: 42.5 % (ref 39.0–52.0)
Hemoglobin: 14.2 g/dL (ref 13.0–17.0)
Lymphocytes Relative: 28.2 % (ref 12.0–46.0)
Lymphs Abs: 1.8 10*3/uL (ref 0.7–4.0)
MCHC: 33.4 g/dL (ref 30.0–36.0)
MCV: 93.4 fl (ref 78.0–100.0)
Monocytes Absolute: 0.6 10*3/uL (ref 0.1–1.0)
Monocytes Relative: 9 % (ref 3.0–12.0)
Neutro Abs: 3.7 10*3/uL (ref 1.4–7.7)
Neutrophils Relative %: 57.4 % (ref 43.0–77.0)
Platelets: 140 10*3/uL — ABNORMAL LOW (ref 150.0–400.0)
RBC: 4.55 Mil/uL (ref 4.22–5.81)
RDW: 15.6 % — ABNORMAL HIGH (ref 11.5–15.5)
WBC: 6.4 10*3/uL (ref 4.0–10.5)

## 2023-02-03 LAB — COMPREHENSIVE METABOLIC PANEL
ALT: 20 U/L (ref 0–53)
AST: 12 U/L (ref 0–37)
Albumin: 4.1 g/dL (ref 3.5–5.2)
Alkaline Phosphatase: 83 U/L (ref 39–117)
BUN: 12 mg/dL (ref 6–23)
CO2: 25 mEq/L (ref 19–32)
Calcium: 9.2 mg/dL (ref 8.4–10.5)
Chloride: 107 mEq/L (ref 96–112)
Creatinine, Ser: 0.82 mg/dL (ref 0.40–1.50)
GFR: 95.3 mL/min (ref >60.00)
Glucose, Bld: 102 mg/dL — ABNORMAL HIGH (ref 70–99)
Potassium: 4.3 mEq/L (ref 3.5–5.1)
Sodium: 141 mEq/L (ref 135–145)
Total Bilirubin: 1.3 mg/dL — ABNORMAL HIGH (ref 0.2–1.2)
Total Protein: 6.8 g/dL (ref 6.0–8.3)

## 2023-02-03 LAB — HEMOGLOBIN A1C: Hgb A1c MFr Bld: 5.3 % (ref 4.6–6.5)

## 2023-02-03 MED ORDER — MUPIROCIN 2 % EX OINT: TOPICAL_OINTMENT | CUTANEOUS | 0 refills | Status: DC

## 2023-02-03 MED ORDER — METOPROLOL TARTRATE 50 MG PO TABS: 50.0000 mg | ORAL_TABLET | Freq: Two times a day (BID) | ORAL | 1 refills | Status: DC

## 2023-02-03 MED ORDER — ISOSORBIDE MONONITRATE ER 30 MG PO TB24: 30.0000 mg | ORAL_TABLET | Freq: Every day | ORAL | 3 refills | Status: DC

## 2023-02-03 MED ORDER — ALPRAZOLAM 1 MG PO TABS: ORAL_TABLET | ORAL | 1 refills | Status: DC

## 2023-02-03 NOTE — Assessment & Plan Note (Signed)
Chronic Cont on Alprazolam prn  Potential benefits of a long term benzodiazepines  use as well as potential risks  and complications were explained to the patient and were aknowledged.

## 2023-02-03 NOTE — Assessment & Plan Note (Signed)
Smoking again 1 ppd

## 2023-02-03 NOTE — Assessment & Plan Note (Addendum)
He recently underwent successful TEE DCCV.  On Eliquis, Amiodarone  -  check w.Card after 4 wks in NSR when to d/c  In NSR

## 2023-02-03 NOTE — Assessment & Plan Note (Signed)
Check A1c. 

## 2023-02-03 NOTE — Assessment & Plan Note (Signed)
On Lipitor

## 2023-02-03 NOTE — Progress Notes (Signed)
Subjective:  Patient ID: [name], male    DOB: 10-Jun-1960  Age: 62 y.o. MRN: [id number]  CC: Follow-up (3 mnth f/u)   HPI Dominique Forlenza presents for CAD/CABG, A fib, dyslipidemia, DM Smoking again 1 ppd He recently underwent successful TEE DCCV.   Per hx:  "Mr. 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 He presented to the [facility] ED the morning of 08/17 complaining of chest pain that woke him up from his sleep. He described the pain as a chest heaviness that radiated down both arms and was associated with shortness of breath and diaphoresis. He states this was similar but worse than his past MI. He mentions increase in the frequency and severity of chest pain over the last few months with exertions and at rest. While in the ED his EKG noted to have nonspecific ST-T wave changed in the inferior leads and repeat EKG noted new T wave inversions in the anterolateral leads. His troponin I (high sensitivity) trended up and peaked at 1,078. He was ruled in for NSTEMI and was transferred to [facility]. His heart catheterizations in 2015 and 2017 showed mild nonocclusive stenosis in several of his coronary arteries. In 2022 he underwent a coronary CT, he had a coronary calcium score of 315 placing him in 88th percentile, he also had diffuse mild non-obstructive CAD 25-49% so there was a low risk for occlusive CAD. Cardiac catheterization on 12/30/22 showed 30-95% LAD stenosis, 60% circumflex stenosis, and 50-95% RCA stenosis. His echocardiogram on 08/18 showed LVEF 55-60%  with grade I diastolic dysfunction and trivial mitral valve regurgitation with otherwise normal valves.  Cardiothoracic surgery consultation was requested.His A1C was 6.6 and he does not currently take diabetes medications at home. Pt states he does not have diabetes. Urine toxicology was positive for opiates and THC but negative for cocaine. Patient had received morphine prior to toxicology screen, denies drug use other than marijuana use. He is currently chest pain free.    Hospital Course:   He was evaluated by Dr. Dorris Fetch who felt he was a surgical candidate.  The risks and benefits of the procedure were explained to the patient and he was agreeable to proceed.  He was taken to the operating room on 01/02/2023.  He underwent CABG x 4 utilizing LIMA to LAD, Right Radial Artery to OM, SVG to Diagonal, and SVG to distal RCA.  He also underwent open right radial artery harvest and endoscopic vein harvest from his left leg.  He tolerated the procedure without difficulty and was taken to the SICU in stable condition. He was extubated early the evening of surgery. Swan,  a line, and chest tubes were removed.  He was started on Lopressor and transitioned from Nitroglycerin drip to oral Imdur for radial artery conduit. He has OSA and used bi pap at night. He was volume overloaded and diuresed accordingly. He was transitioned off the Insulin drip. His pre op HGA1C was 6.6. He was restarted on his Metformin on 08/26 and educated on importance of blood sugar control. He had expected post op blood loss anemia. He did not require a  post op transfusion and this stabilized with time. He also had a reactive postoperative thrombocytopenia which showed an improving trend over time. He was felt stable for transfer from the ICU to 4E for further convalescence on 08/24.  He developed atrial fibrillation/flutter with RVR and amiodarone protocol was initiated. Additionally beta-blocker was uptitrated and the EP team assisted  with management. A limited echocardiogram was performed on postop day 3 which showed improved LV function and no significant finding consistent with pericardial effusion. He remained in atrial flutter with RVR on IV Amiodarone, he was given another bolus of Amiodarone to assist with chemical conversion. He was started on Eliquis for stroke prevention. Losartan was stopped so that Lopressor could be increased to 50 mg tid and he was given another IV Amiodarone bolus. Cardiology arranged for patient to undergo a TEE/DCCV on 08/27. He converted and remained in sinus rhythm. He was transitioned from IV Amiodarone to oral Amiodarone. Lopressor was changed to 50 mg bid. He has been ambulating on room air with good oxygenation. All wounds are clean, dry, healing without signs of infection. Motor/sensory intact RUE (right radial artery harvest). He has been tolerating a diet and has had a bowel movement. Chest tube sutures were removed on 08/29. He is stable for discharge today."  Outpatient Medications Prior to Visit  Medication Sig Dispense Refill   amiodarone (PACERONE) 200 MG tablet Take 400 mg orally two times daily for 2 days;then take 200 mg orally two times daily for 10 days;then take 200 mg orally daily thereafter 60 tablet 1   apixaban (ELIQUIS) 5 MG TABS tablet Take 1 tablet (5 mg total) by mouth 2 (two) times daily. 60 tablet 1   aspirin EC 81 MG tablet Take 81 mg by mouth daily. Swallow whole.     atorvastatin (LIPITOR) 80 MG tablet Take 1 tablet (80 mg total) by mouth daily. 30 tablet 1   Blood Glucose Monitoring Suppl (ONE TOUCH ULTRA 2) w/Device KIT Use to check blood sugars twice a day 1 kit 0   budesonide-formoterol (SYMBICORT) 160-4.5 MCG/ACT inhaler INHALE 2 PUFS INTO THE LUNGS TWICE A DAY 10.2 each 11   [START ON 03/10/2024] diclofenac (VOLTAREN) 75 MG EC tablet Take 1 tablet (75 mg total) by mouth 2 (two) times daily as needed. Take 75 mg by mouth 2 (two) times daily as needed. 180 tablet 1    furosemide (LASIX) 40 MG tablet Take 1 tablet (40 mg total) by mouth daily. For 5 days then stop. 5 tablet 0   glucose blood (ONETOUCH ULTRA) test strip Use to check blood sugars twice a day 100 each 12   metFORMIN (GLUCOPHAGE-XR) 500 MG 24 hr tablet Take 1 tablet (500 mg total) by mouth daily with breakfast. 30 tablet 1   nicotine (NICODERM CQ - DOSED IN MG/24 HOURS) 21 mg/24hr patch Place 1 patch (21 mg total) onto the skin daily. 28 patch 3   OneTouch UltraSoft 2 Lancets MISC 1 Lancet by Does not apply route 2 (two) times daily. 100 each 3   oxyCODONE (OXY IR/ROXICODONE) 5 MG immediate release tablet Take 1 tablet (5 mg total) by mouth every 6 (six) hours as needed for severe pain. 30 tablet 0   potassium chloride SA (KLOR-CON M) 20 MEQ tablet Take 1 tablet (20 mEq total) by mouth daily. For 5 days then stop. 5 tablet 0   ALPRAZolam (XANAX) 1 MG tablet TAKE 1 TABLET BY MOUTH 2 TIMES DAILY AS NEEDED. 60 tablet 1  isosorbide mononitrate (IMDUR) 30 MG 24 hr tablet Take 1 tablet (30 mg total) by mouth daily. 90 tablet 3   metoprolol tartrate (LOPRESSOR) 50 MG tablet Take 1 tablet (50 mg total) by mouth 2 (two) times daily. 60 tablet 1   No facility-administered medications prior to visit.    ROS: Review of Systems  Constitutional:  Negative for appetite change, fatigue and unexpected weight change.  HENT:  Negative for congestion, nosebleeds, sneezing, sore throat and trouble swallowing.   Eyes:  Negative for itching and visual disturbance.  Respiratory:  Negative for cough.   Cardiovascular:  Negative for chest pain, palpitations and leg swelling.  Gastrointestinal:  Negative for abdominal distention, blood in stool, diarrhea and nausea.  Genitourinary:  Negative for frequency and hematuria.  Musculoskeletal:  Positive for back pain. Negative for gait problem, joint swelling and neck pain.  Skin:  Negative for rash.  Neurological:  Negative for dizziness, tremors, speech difficulty and  weakness.  Psychiatric/Behavioral:  Negative for agitation, dysphoric mood, sleep disturbance and suicidal ideas. The patient is not nervous/anxious.     Objective:  BP 120/80 (BP Location: Left Arm, Patient Position: Sitting, Cuff Size: Large)   Pulse (!) 59   Temp 98.6 F (37 C) (Oral)   Ht 6' (1.829 m)   Wt 277 lb (125.6 kg)   SpO2 93%   BMI 37.57 kg/m   BP Readings from Last 3 Encounters:  02/03/23 120/80  01/23/23 122/63  01/22/23 126/70    Wt Readings from Last 3 Encounters:  02/03/23 277 lb (125.6 kg)  01/23/23 276 lb (125.2 kg)  01/22/23 276 lb 12.8 oz (125.6 kg)    Physical Exam Constitutional:      General: He is not in acute distress.    Appearance: He is well-developed. He is obese.     Comments: NAD  Eyes:     Conjunctiva/sclera: Conjunctivae normal.     Pupils: Pupils are equal, round, and reactive to light.  Neck:     Thyroid: No thyromegaly.     Vascular: No JVD.  Cardiovascular:     Rate and Rhythm: Normal rate and regular rhythm.     Heart sounds: Normal heart sounds. No murmur heard.    No friction rub. No gallop.  Pulmonary:     Effort: Pulmonary effort is normal. No respiratory distress.     Breath sounds: Normal breath sounds. No wheezing or rales.  Chest:     Chest wall: No tenderness.  Abdominal:     General: Bowel sounds are normal. There is no distension.     Palpations: Abdomen is soft. There is no mass.     Tenderness: There is no abdominal tenderness. There is no guarding or rebound.  Musculoskeletal:        General: No tenderness. Normal range of motion.     Cervical back: Normal range of motion.  Lymphadenopathy:     Cervical: No cervical adenopathy.  Skin:    General: Skin is warm and dry.     Findings: No rash.  Neurological:     Mental Status: He is alert and oriented to person, place, and time.     Cranial Nerves: No cranial nerve deficit.     Motor: No abnormal muscle tone.     Coordination: Coordination normal.      Gait: Gait normal.     Deep Tendon Reflexes: Reflexes are normal and symmetric.  Psychiatric:        Behavior: Behavior normal.  Thought Content: Thought content normal.        Judgment: Judgment normal.   Scars w/open areas  Lab Results  Component Value Date   WBC 10.4 01/05/2023   HGB 12.2 (L) 01/05/2023   HCT 35.3 (L) 01/05/2023   PLT 138 (L) 01/05/2023   GLUCOSE 125 (H) 01/07/2023   CHOL 153 12/29/2022   TRIG 180 (H) 12/29/2022   HDL 33 (L) 12/29/2022   LDLDIRECT 91.0 08/08/2022   LDLCALC 84 12/29/2022   ALT 144 (H) 01/02/2023   AST 82 (H) 01/02/2023   NA 140 01/07/2023   K 3.7 01/07/2023   CL 105 01/07/2023   CREATININE 0.89 01/07/2023   BUN 20 01/07/2023   CO2 25 01/07/2023   TSH 2.337 01/04/2023   PSA 0.69 08/08/2022   INR 1.3 (H) 01/02/2023   HGBA1C 6.6 (H) 01/02/2023    DG Chest 2 View  Result Date: 01/23/2023 CLINICAL DATA:  S/p CABG 01/02/23, heart attack 12/28/22. Pt feels a clicking when he walks mid chest, no other chest complaints today. Smoker EXAM: CHEST - 2 VIEW COMPARISON:  Chest x-ray 01/04/2023, chest x-ray 01/05/2019, CT chest 03/01/2021 FINDINGS: The heart and mediastinal contours are unchanged. No focal consolidation. No pulmonary edema. Nonspecific blunting of the right costophrenic angle. No significant pleural effusion. No pneumothorax. No acute osseous abnormality.  Sternotomy wires are intact. IMPRESSION: No active cardiopulmonary disease. Electronically Signed   By: Tish Frederickson M.D.   On: 01/23/2023 13:12    Assessment & Plan:   Problem List Items Addressed This Visit     Anxiety disorder    Chronic Cont on Alprazolam prn  Potential benefits of a long term benzodiazepines  use as well as potential risks  and complications were explained to the patient and were aknowledged.      Relevant Medications   ALPRAZolam (XANAX) 1 MG tablet   Essential hypertension (Chronic)    Chronic.  Continue metoprolol.  Pursue weight loss effort       Relevant Medications   isosorbide mononitrate (IMDUR) 30 MG 24 hr tablet   metoprolol tartrate (LOPRESSOR) 50 MG tablet   Tobacco dependence    Smoking again 1 ppd      Coronary artery disease involving native coronary artery of native heart with unstable angina pectoris (HCC) - Primary (Chronic)     "Mr. 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 He presented to the [facility] ED the morning of 08/17 complaining of chest pain that woke him up from his sleep. He described the pain as a chest heaviness that radiated down both arms and was associated with shortness of breath and diaphoresis. He states this was similar but worse than his past MI. He mentions increase in the frequency and severity of chest pain over the last few months with exertions and at rest. While in the ED his EKG noted to have nonspecific ST-T wave changed in the inferior leads and repeat EKG noted new T wave inversions in the anterolateral leads. His troponin I (high sensitivity) trended up and peaked at 1,078. He was ruled in for NSTEMI and was transferred to [facility].  His heart catheterizations in 2015 and 2017 showed mild nonocclusive stenosis in several of his coronary arteries. In 2022 he underwent a coronary CT, he had a coronary calcium score of 315 placing him in 88th percentile, he also had diffuse mild non-obstructive CAD 25-49% so there was a low risk for occlusive CAD. Cardiac catheterization on 12/30/22 showed 30-95% LAD stenosis, 60% circumflex stenosis, and 50-95% RCA stenosis. His echocardiogram on 08/18 showed  LVEF 55-60% with grade I diastolic dysfunction and trivial mitral valve regurgitation with otherwise normal valves.  Cardiothoracic surgery consultation was requested.His A1C was 6.6 and he does not currently take diabetes medications at home. Pt states he does not have diabetes. Urine toxicology was positive for opiates and THC but negative for cocaine. Patient had received morphine prior to toxicology screen, denies drug use other than marijuana use. He is currently chest pain free.    Hospital Course:   He was evaluated by Dr. Dorris Fetch who felt he was a surgical candidate.  The risks and benefits of the procedure were explained to the patient and he was agreeable to proceed.  He was taken to the operating room on 01/02/2023.  He underwent CABG x 4 utilizing LIMA to LAD, Right Radial Artery to OM, SVG to Diagonal, and SVG to distal RCA.  He also underwent open right radial artery harvest and endoscopic vein harvest from his left leg.  He tolerated the procedure without difficulty and was taken to the SICU in stable condition. He was extubated early the evening of surgery. Swan,  a line, and chest tubes were removed.  He was started on Lopressor and transitioned from Nitroglycerin drip to oral Imdur for radial artery conduit. He has OSA and used bi pap at night. He was volume overloaded and diuresed accordingly. He was transitioned off the Insulin drip. His pre op HGA1C was 6.6. He was restarted on his Metformin on 08/26 and educated on importance of blood sugar control. He had expected post op blood loss anemia. He did not require a post op transfusion and this stabilized with time. He also had a reactive postoperative thrombocytopenia which showed an improving trend over time. He was felt stable for transfer from the ICU to 4E for further convalescence on 08/24.  He developed atrial fibrillation/flutter with RVR and amiodarone protocol was initiated. Additionally beta-blocker was uptitrated and the EP team  assisted with management. A limited echocardiogram was performed on postop day 3 which showed improved LV function and no significant finding consistent with pericardial effusion. He remained in atrial flutter with RVR on IV Amiodarone, he was given another bolus of Amiodarone to assist with chemical conversion. He was started on Eliquis for stroke prevention. Losartan was stopped so that Lopressor could be increased to 50 mg tid and he was given another IV Amiodarone bolus. Cardiology arranged for patient to undergo a TEE/DCCV on 08/27. He converted and remained in sinus rhythm. He was transitioned from IV Amiodarone to oral Amiodarone. Lopressor was changed to 50 mg bid. He has been ambulating on room air with good oxygenation. All wounds are clean, dry, healing without signs of infection. Motor/sensory intact RUE (right radial artery harvest). He has been tolerating a diet and has had a bowel movement. Chest tube sutures were removed on 08/29. He is stable for discharge today."  Cont on Lipitor, ASA, Imdur, Lopressor      Relevant Medications   isosorbide mononitrate (IMDUR) 30 MG 24 hr tablet   metoprolol tartrate (LOPRESSOR) 50 MG tablet  Other Relevant Orders   Amb Referral to Cardiac Rehabilitation   Type 2 diabetes mellitus with obesity (HCC) (Chronic)    Check A1c      Relevant Orders   Comprehensive metabolic panel   Hemoglobin A1c   CBC with Differential/Platelet   Hyperlipidemia associated with type 2 diabetes mellitus (HCC) (Chronic)    On Lipitor      Relevant Medications   isosorbide mononitrate (IMDUR) 30 MG 24 hr tablet   metoprolol tartrate (LOPRESSOR) 50 MG tablet   Other Relevant Orders   Comprehensive metabolic panel   Hemoglobin A1c   CBC with Differential/Platelet   Atrial fibrillation (HCC) - Post Op Aflutter/Fib    He recently underwent successful TEE DCCV.  On Eliquis, Amiodarone  -  check w.Card after 4 wks in NSR when to d/c  In NSR      Relevant  Medications   isosorbide mononitrate (IMDUR) 30 MG 24 hr tablet   metoprolol tartrate (LOPRESSOR) 50 MG tablet   Other Relevant Orders   Comprehensive metabolic panel   Hemoglobin A1c   CBC with Differential/Platelet      Meds ordered this encounter  Medications   ALPRAZolam (XANAX) 1 MG tablet    Sig: TAKE 1 TABLET BY MOUTH 2 TIMES DAILY AS NEEDED.    Dispense:  60 tablet    Refill:  1   isosorbide mononitrate (IMDUR) 30 MG 24 hr tablet    Sig: Take 1 tablet (30 mg total) by mouth daily.    Dispense:  90 tablet    Refill:  3   metoprolol tartrate (LOPRESSOR) 50 MG tablet    Sig: Take 1 tablet (50 mg total) by mouth 2 (two) times daily.    Dispense:  180 tablet    Refill:  1   mupirocin ointment (BACTROBAN) 2 %    Sig: On leg wound w/dressing change qd or bid    Dispense:  30 g    Refill:  0      Follow-up: Return in about 3 months (around 05/05/2023) for a follow-up visit.  Sonda Primes, MD

## 2023-02-03 NOTE — Telephone Encounter (Signed)
I called pt to see if he was interested in cardiac rehab. We had received a prior referral and sent it to [place]. Willeen Cass asked what cardiac rehab entailed so I did explain how everything works and what all we offer here such as [facility]. Willeen Cass stated " I live on [place] acres [place] land and walk my property and I feel that is enough walking exercise I need." I told him we understand and that I would close this referral. He said " Thank you! Also I am 62 years old and I don't want people telling what to do and what to eat. I am going to eat whatever the hell I want to and if I get another 20 years then I will be tickled." I did explain that if he does change his mind he does have up to a year to use the referral from his heart incident. He verbalized he understood.

## 2023-02-03 NOTE — Assessment & Plan Note (Signed)
"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 He presented to the [facility] at [facility] ED the morning of 08/17 complaining of chest pain that woke him up from his sleep. He described the pain as a chest heaviness that radiated down both arms and was associated with shortness of breath and diaphoresis. He states this was similar but worse than his past MI. He mentions increase in the frequency and severity of chest pain over the last few months with exertions and at rest. While in the ED his EKG noted to have nonspecific ST-T wave changed in the inferior leads and repeat EKG noted new T wave inversions in the anterolateral leads. His troponin I (high sensitivity) trended up and peaked at 1,078. He was ruled in for NSTEMI and was transferred to [facility]. His heart catheterizations in 2015 and 2017 showed mild nonocclusive stenosis in several of his coronary arteries. In 2022 he underwent a coronary CT, he had a coronary calcium score of 315 placing him in 88th percentile, he also had diffuse mild non-obstructive CAD 25-49% so there was a low risk for occlusive CAD. Cardiac catheterization on 12/30/22 showed 30-95% LAD stenosis, 60% circumflex stenosis, and 50-95% RCA stenosis. His echocardiogram on 08/18 showed LVEF 55-60% with grade I diastolic dysfunction and trivial mitral valve regurgitation with otherwise normal valves.  Cardiothoracic surgery consultation was requested.His A1C was 6.6 and he does not currently take diabetes medications at home. Pt states he does not have diabetes. Urine toxicology was positive for  opiates and THC but negative for cocaine. Patient had received morphine prior to toxicology screen, denies drug use other than marijuana use. He is currently chest pain free.    Hospital Course:   He was evaluated by Dr. Dorris Fetch who felt he was a surgical candidate.  The risks and benefits of the procedure were explained to the patient and he was agreeable to proceed.  He was taken to the operating room on 01/02/2023.  He underwent CABG x 4 utilizing LIMA to LAD, Right Radial Artery to OM, SVG to Diagonal, and SVG to distal RCA.  He also underwent open right radial artery harvest and endoscopic vein harvest from his left leg.  He tolerated the procedure without difficulty and was taken to the SICU in stable condition. He was extubated early the evening of surgery. Swan,  a line, and chest tubes were removed.  He was started on Lopressor and transitioned from Nitroglycerin drip to oral Imdur for radial artery conduit. He has OSA and used bi pap at night. He was volume overloaded and diuresed accordingly. He was transitioned off the Insulin drip. His pre op HGA1C was 6.6. He was restarted on his Metformin on 08/26 and educated on importance of blood sugar control. He had expected post op blood loss anemia. He did not require a post op transfusion and this stabilized with time. He also had a reactive postoperative thrombocytopenia which showed an improving trend over time. He was felt stable for transfer from the ICU to 4E for further convalescence on 08/24.  He developed atrial fibrillation/flutter with RVR and amiodarone protocol was initiated. Additionally beta-blocker was uptitrated  and the EP team assisted with management. A limited echocardiogram was performed on postop day 3 which showed improved LV function and no significant finding consistent with pericardial effusion. He remained in atrial flutter with RVR on IV Amiodarone, he was given another bolus of Amiodarone to assist with chemical conversion.  He was started on Eliquis for stroke prevention. Losartan was stopped so that Lopressor could be increased to 50 mg tid and he was given another IV Amiodarone bolus. Cardiology arranged for patient to undergo a TEE/DCCV on 08/27. He converted and remained in sinus rhythm. He was transitioned from IV Amiodarone to oral Amiodarone. Lopressor was changed to 50 mg bid. He has been ambulating on room air with good oxygenation. All wounds are clean, dry, healing without signs of infection. Motor/sensory intact RUE (right radial artery harvest). He has been tolerating a diet and has had a bowel movement. Chest tube sutures were removed on 08/29. He is stable for discharge today."  Cont on Lipitor, ASA, Imdur, Lopressor

## 2023-02-03 NOTE — Assessment & Plan Note (Signed)
Chronic.  Continue metoprolol.  Pursue weight loss effort

## 2023-02-09 LAB — ECHO INTRAOPERATIVE TEE
AR max vel: 2.25 cm2
AV Area VTI: 1.97 cm2
AV Area mean vel: 1.99 cm2
AV Mean grad: 3 mm[Hg]
AV Peak grad: 4.9 mm[Hg]
Ao pk vel: 1.11 m/s
Area-P 1/2: 2.9 cm2
MV VTI: 2.58 cm2
MV Vena cont: 0.3 cm

## 2023-03-15 ENCOUNTER — Other Ambulatory Visit: Payer: Self-pay | Admitting: Physician Assistant

## 2023-04-23 ENCOUNTER — Encounter: Payer: Self-pay | Admitting: Internal Medicine

## 2023-04-23 ENCOUNTER — Ambulatory Visit (INDEPENDENT_AMBULATORY_CARE_PROVIDER_SITE_OTHER): Payer: 59 | Admitting: Internal Medicine

## 2023-04-23 VITALS — BP 130/82 | HR 56 | Temp 97.2°F | Ht 72.0 in | Wt 278.6 lb

## 2023-04-23 DIAGNOSIS — F17200 Nicotine dependence, unspecified, uncomplicated: Secondary | ICD-10-CM

## 2023-04-23 DIAGNOSIS — E1169 Type 2 diabetes mellitus with other specified complication: Secondary | ICD-10-CM

## 2023-04-23 DIAGNOSIS — I4819 Other persistent atrial fibrillation: Secondary | ICD-10-CM | POA: Diagnosis not present

## 2023-04-23 DIAGNOSIS — I25110 Atherosclerotic heart disease of native coronary artery with unstable angina pectoris: Secondary | ICD-10-CM

## 2023-04-23 DIAGNOSIS — I10 Essential (primary) hypertension: Secondary | ICD-10-CM

## 2023-04-23 DIAGNOSIS — F419 Anxiety disorder, unspecified: Secondary | ICD-10-CM

## 2023-04-23 DIAGNOSIS — E785 Hyperlipidemia, unspecified: Secondary | ICD-10-CM

## 2023-04-23 DIAGNOSIS — G4733 Obstructive sleep apnea (adult) (pediatric): Secondary | ICD-10-CM

## 2023-04-23 DIAGNOSIS — E669 Obesity, unspecified: Secondary | ICD-10-CM | POA: Diagnosis not present

## 2023-04-23 DIAGNOSIS — I700 Atherosclerosis of aorta: Secondary | ICD-10-CM

## 2023-04-23 DIAGNOSIS — R051 Acute cough: Secondary | ICD-10-CM

## 2023-04-23 LAB — COMPREHENSIVE METABOLIC PANEL
ALT: 22 U/L (ref 0–53)
AST: 18 U/L (ref 0–37)
Albumin: 4.6 g/dL (ref 3.5–5.2)
Alkaline Phosphatase: 66 U/L (ref 39–117)
BUN: 15 mg/dL (ref 6–23)
CO2: 27 meq/L (ref 19–32)
Calcium: 9.4 mg/dL (ref 8.4–10.5)
Chloride: 105 meq/L (ref 96–112)
Creatinine, Ser: 0.87 mg/dL (ref 0.40–1.50)
GFR: 93.47 mL/min (ref >60.00)
Glucose, Bld: 113 mg/dL — ABNORMAL HIGH (ref 70–99)
Potassium: 4.6 meq/L (ref 3.5–5.1)
Sodium: 141 meq/L (ref 135–145)
Total Bilirubin: 0.9 mg/dL (ref 0.2–1.2)
Total Protein: 7.2 g/dL (ref 6.0–8.3)

## 2023-04-23 LAB — MICROALBUMIN / CREATININE URINE RATIO
Creatinine,U: 119 mg/dL
Microalb Creat Ratio: 12.2 mg/g (ref 0.0–30.0)
Microalb, Ur: 14.5 mg/dL — ABNORMAL HIGH (ref 0.0–1.9)

## 2023-04-23 LAB — CBC WITH DIFFERENTIAL/PLATELET
Basophils Absolute: 0 10*3/uL (ref 0.0–0.1)
Basophils Relative: 0.3 % (ref 0.0–3.0)
Eosinophils Absolute: 0.2 10*3/uL (ref 0.0–0.7)
Eosinophils Relative: 2.5 % (ref 0.0–5.0)
HCT: 52.8 % — ABNORMAL HIGH (ref 39.0–52.0)
Hemoglobin: 17.9 g/dL — ABNORMAL HIGH (ref 13.0–17.0)
Lymphocytes Relative: 18.6 % (ref 12.0–46.0)
Lymphs Abs: 1.8 10*3/uL (ref 0.7–4.0)
MCHC: 34 g/dL (ref 30.0–36.0)
MCV: 89.9 fL (ref 78.0–100.0)
Monocytes Absolute: 0.8 10*3/uL (ref 0.1–1.0)
Monocytes Relative: 8.5 % (ref 3.0–12.0)
Neutro Abs: 7 10*3/uL (ref 1.4–7.7)
Neutrophils Relative %: 70.1 % (ref 43.0–77.0)
Platelets: 144 10*3/uL — ABNORMAL LOW (ref 150.0–400.0)
RBC: 5.87 Mil/uL — ABNORMAL HIGH (ref 4.22–5.81)
RDW: 14.1 % (ref 11.5–15.5)
WBC: 9.9 10*3/uL (ref 4.0–10.5)

## 2023-04-23 LAB — HEMOGLOBIN A1C: Hgb A1c MFr Bld: 6.5 % (ref 4.6–6.5)

## 2023-04-23 LAB — VITAMIN B12: Vitamin B-12: 476 pg/mL (ref 211–911)

## 2023-04-23 MED ORDER — HYDROCODONE BIT-HOMATROP MBR 5-1.5 MG/5ML PO SOLN: 5.0000 mL | Freq: Three times a day (TID) | ORAL | 0 refills | Status: DC | PRN

## 2023-04-23 MED ORDER — METOPROLOL TARTRATE 50 MG PO TABS: 50.0000 mg | ORAL_TABLET | Freq: Two times a day (BID) | ORAL | 1 refills | Status: DC

## 2023-04-23 MED ORDER — ISOSORBIDE MONONITRATE ER 30 MG PO TB24: 30.0000 mg | ORAL_TABLET | Freq: Every day | ORAL | 3 refills | Status: DC

## 2023-04-23 MED ORDER — AZITHROMYCIN 250 MG PO TABS: ORAL_TABLET | ORAL | 0 refills | Status: DC

## 2023-04-23 MED ORDER — ALPRAZOLAM 1 MG PO TABS: ORAL_TABLET | ORAL | 1 refills | Status: DC

## 2023-04-23 NOTE — Assessment & Plan Note (Signed)
Not taking statins.

## 2023-04-23 NOTE — Assessment & Plan Note (Signed)
URI: Hycodan syrup. A Zpack if worse

## 2023-04-23 NOTE — Assessment & Plan Note (Signed)
On CPAP. ?

## 2023-04-23 NOTE — Progress Notes (Signed)
Subjective:  Patient ID: [name], male    DOB: February 23, 1961  Age: 62 y.o. MRN: [id number]  CC: No chief complaint on file.   HPI Notorious Dorow presents for CAD, anxiety, COPD  URI sx's x 2 d, cough  Bennett stopped most of his meds  Smoking 1-1.5 PPD  Outpatient Medications Prior to Visit  Medication Sig Dispense Refill   aspirin EC 81 MG tablet Take 81 mg by mouth daily. Swallow whole.     budesonide-formoterol (SYMBICORT) 160-4.5 MCG/ACT inhaler INHALE 2 PUFS INTO THE LUNGS TWICE A DAY 10.2 each 11   [START ON 03/10/2024] diclofenac (VOLTAREN) 75 MG EC tablet Take 1 tablet (75 mg total) by mouth 2 (two) times daily as needed. Take 75 mg by mouth 2 (two) times daily as needed. 180 tablet 1   glucose blood (ONETOUCH ULTRA) test strip Use to check blood sugars twice a day 100 each 12   ALPRAZolam (XANAX) 1 MG tablet TAKE 1 TABLET BY MOUTH 2 TIMES DAILY AS NEEDED. 60 tablet 1   amiodarone (PACERONE) 200 MG tablet Take 400 mg orally two times daily for 2 days;then take 200 mg orally two times daily for 10 days;then take 200 mg orally daily thereafter 60 tablet 1   isosorbide mononitrate (IMDUR) 30 MG 24 hr tablet Take 1 tablet (30 mg total) by mouth daily. 90 tablet 3   Blood Glucose Monitoring Suppl (ONE TOUCH ULTRA 2) w/Device KIT Use to check blood sugars twice a day (Patient not taking: Reported on 04/23/2023) 1 kit 0   mupirocin ointment (BACTROBAN) 2 % On leg wound w/dressing change qd or bid (Patient not taking: Reported on 04/23/2023) 30 g 0   OneTouch UltraSoft 2 Lancets MISC 1 Lancet by Does not apply route 2 (two) times daily. (Patient not taking: Reported on 04/23/2023) 100 each 3   apixaban (ELIQUIS) 5 MG TABS tablet Take 1 tablet (5 mg total) by mouth 2 (two) times daily. (Patient not taking: Reported on 04/23/2023) 60 tablet 1   atorvastatin (LIPITOR) 80 MG tablet Take 1 tablet (80 mg total) by mouth daily. (Patient not taking: Reported on  04/23/2023) 30 tablet 1   furosemide (LASIX) 40 MG tablet Take 1 tablet (40 mg total) by mouth daily. For 5 days then stop. (Patient not taking: Reported on 04/23/2023) 5 tablet 0   metFORMIN (GLUCOPHAGE-XR) 500 MG 24 hr tablet Take 1 tablet (500 mg total) by mouth daily with breakfast. (Patient not taking: Reported on 04/23/2023) 30 tablet 1   metoprolol tartrate (LOPRESSOR) 50 MG tablet Take 1 tablet (50 mg total) by mouth 2 (two) times daily. 180 tablet 1   nicotine (NICODERM CQ - DOSED IN MG/24 HOURS) 21 mg/24hr patch Place 1 patch (21 mg total) onto the skin daily. (Patient not taking: Reported on 04/23/2023) 28 patch 3   oxyCODONE (OXY IR/ROXICODONE) 5 MG immediate release tablet Take 1 tablet (5 mg total) by mouth every 6 (six) hours as needed for severe pain. (Patient not taking: Reported on 04/23/2023) 30 tablet 0   potassium chloride SA (KLOR-CON M) 20 MEQ tablet Take 1 tablet (20 mEq total) by mouth daily. For 5 days then stop. (Patient not taking: Reported on 04/23/2023) 5 tablet 0   No facility-administered medications prior to visit.    ROS: Review of Systems  Constitutional:  Negative for appetite change, fatigue and unexpected weight change.  HENT:  Negative for congestion, nosebleeds, sneezing, sore throat and trouble swallowing.   Eyes:  Negative for itching and visual disturbance.  Respiratory:  Negative for cough.   Cardiovascular:  Negative for chest pain, palpitations and leg swelling.  Gastrointestinal:  Negative for abdominal distention, blood in stool, diarrhea and nausea.  Genitourinary:  Negative for frequency and hematuria.  Musculoskeletal:  Positive for back pain. Negative for gait problem, joint swelling and neck pain.  Skin:  Negative for rash.  Neurological:  Negative for dizziness, tremors, speech difficulty and weakness.  Psychiatric/Behavioral:  Negative for agitation, dysphoric mood and sleep disturbance. The patient is nervous/anxious.     Objective:   BP 130/82   Pulse (!) 56   Temp (!) 97.2 F (36.2 C)   Ht 6' (1.829 m)   Wt 278 lb 9.6 oz (126.4 kg)   SpO2 99%   BMI 37.78 kg/m   BP Readings from Last 3 Encounters:  04/23/23 130/82  02/03/23 120/80  01/23/23 122/63    Wt Readings from Last 3 Encounters:  04/23/23 278 lb 9.6 oz (126.4 kg)  02/03/23 277 lb (125.6 kg)  01/23/23 276 lb (125.2 kg)    Physical Exam Constitutional:      General: He is not in acute distress.    Appearance: He is well-developed.     Comments: NAD  Eyes:     Conjunctiva/sclera: Conjunctivae normal.     Pupils: Pupils are equal, round, and reactive to light.  Neck:     Thyroid: No thyromegaly.     Vascular: No JVD.  Cardiovascular:     Rate and Rhythm: Normal rate and regular rhythm.     Heart sounds: Normal heart sounds. No murmur heard.    No friction rub. No gallop.  Pulmonary:     Effort: Pulmonary effort is normal. No respiratory distress.     Breath sounds: Normal breath sounds. No wheezing or rales.  Chest:     Chest wall: No tenderness.  Abdominal:     General: Bowel sounds are normal. There is no distension.     Palpations: Abdomen is soft. There is no mass.     Tenderness: There is no abdominal tenderness. There is no guarding or rebound.  Musculoskeletal:        General: No tenderness. Normal range of motion.     Cervical back: Normal range of motion.  Lymphadenopathy:     Cervical: No cervical adenopathy.  Skin:    General: Skin is warm and dry.     Findings: No rash.  Neurological:     Mental Status: He is alert and oriented to person, place, and time.     Cranial Nerves: No cranial nerve deficit.     Motor: No abnormal muscle tone.     Coordination: Coordination normal.     Gait: Gait normal.     Deep Tendon Reflexes: Reflexes are normal and symmetric.  Psychiatric:        Behavior: Behavior normal.        Thought Content: Thought content normal.        Judgment: Judgment normal.     Lab Results  Component  Value Date   WBC 6.4 02/03/2023   HGB 14.2 02/03/2023   HCT 42.5 02/03/2023   PLT 140.0 (L) 02/03/2023   GLUCOSE 102 (H) 02/03/2023   CHOL 153 12/29/2022   TRIG 180 (H) 12/29/2022   HDL 33 (L) 12/29/2022   LDLDIRECT 91.0 08/08/2022   LDLCALC 84 12/29/2022   ALT 20 02/03/2023   AST 12 02/03/2023   NA 141 02/03/2023  K 4.3 02/03/2023   CL 107 02/03/2023   CREATININE 0.82 02/03/2023   BUN 12 02/03/2023   CO2 25 02/03/2023   TSH 2.337 01/04/2023   PSA 0.69 08/08/2022   INR 1.3 (H) 01/02/2023   HGBA1C 5.3 02/03/2023    DG Chest 2 View  Result Date: 01/23/2023 CLINICAL DATA:  S/p CABG 01/02/23, heart attack 12/28/22. Pt feels a clicking when he walks mid chest, no other chest complaints today. Smoker EXAM: CHEST - 2 VIEW COMPARISON:  Chest x-ray 01/04/2023, chest x-ray 01/05/2019, CT chest 03/01/2021 FINDINGS: The heart and mediastinal contours are unchanged. No focal consolidation. No pulmonary edema. Nonspecific blunting of the right costophrenic angle. No significant pleural effusion. No pneumothorax. No acute osseous abnormality.  Sternotomy wires are intact. IMPRESSION: No active cardiopulmonary disease. Electronically Signed   By: Tish Frederickson M.D.   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)   Relevant Orders   Microalbumin / creatinine urine ratio   Hemoglobin A1c    Vitamin B12   Hyperlipidemia associated with type 2 diabetes mellitus (HCC) (Chronic)   Relevant Medications   metoprolol tartrate (LOPRESSOR) 50 MG tablet   isosorbide mononitrate (IMDUR) 30 MG 24 hr tablet   Atrial fibrillation (HCC) - Post Op Aflutter/Fib   Relevant Medications   metoprolol tartrate (LOPRESSOR) 50 MG tablet   isosorbide mononitrate (IMDUR) 30 MG 24 hr tablet      Meds ordered this encounter  Medications   metoprolol tartrate (LOPRESSOR) 50 MG tablet    Sig: Take 1 tablet (50 mg total) by mouth 2 (two) times daily.    Dispense:  180 tablet    Refill:  1   ALPRAZolam (XANAX) 1 MG tablet    Sig: TAKE 1 TABLET BY MOUTH 2 TIMES DAILY AS NEEDED.    Dispense:  60 tablet    Refill:  1   isosorbide mononitrate (IMDUR) 30 MG 24 hr tablet    Sig: Take 1 tablet (30 mg total) by mouth daily.    Dispense:  90 tablet    Refill:  3   HYDROcodone bit-homatropine (HYCODAN) 5-1.5 MG/5ML syrup    Sig: Take 5 mLs by mouth every 8 (eight) hours as needed for cough.    Dispense:  120 mL    Refill:  0   azithromycin (ZITHROMAX Z-PAK) 250 MG tablet    Sig: As directed    Dispense:  6 tablet    Refill:  0      Follow-up: No follow-ups on file.  Sonda Primes, MD

## 2023-04-23 NOTE — Assessment & Plan Note (Signed)
Started again 1-1.5 PPD

## 2023-04-23 NOTE — Assessment & Plan Note (Signed)
Off Metformin 

## 2023-04-23 NOTE — Assessment & Plan Note (Signed)
Chronic Cont on Alprazolam prn  Potential benefits of a long term benzodiazepines  use as well as potential risks  and complications were explained to the patient and were aknowledged.

## 2023-04-24 NOTE — Progress Notes (Signed)
Cardiology Office Note:  .   Date:  04/25/2023  ID:  [name], DOB 16-Jun-1960, MRN [id number] PCP: Tresa Garter, MD  [facility] Providers Cardiologist:  Norman Herrlich, MD    History of Present Illness: .   [name] is a 62 y.o. male with history of CAD s/p CABG, persistent Afib, DM, HTN, HLD, obesity who presents for follow-up.   History of Present Illness   [name], a male with a history of coronary artery disease status post coronary artery bypass grafting (CABG), hypertension, hyperlipidemia, and obesity, presents for a follow-up visit. Since his CABG in August, he reports feeling "great" and has had no complications, except for a non-healing incision which has since resolved. He denies diabetes, despite being labeled as such during his hospital stay. He had a brief episode of atrial fibrillation (AFib) post-surgery, which was successfully treated with cardioversion. He has remained in sinus rhythm since. He has been walking daily for exercise but admits to continuing to smoke and not adhering to a heart-healthy diet. He expresses a strong desire to avoid taking multiple medications daily.          Problem List CAD -NSTEMI 12/2022 -CABG x 4 01/02/2023 2. HLD 3. DM -A1c 5.3 4. Persistent Afib -TEE/DCCV 01/07/2023 -CHADSVAC=4 5. Systolic HF -EF 40-45% 01/07/2023 6. Tobacco abuse     ROS: All other ROS reviewed and negative. Pertinent positives noted in the HPI.     Studies Reviewed: [name]       Physical Exam:   VS:  BP (!) 150/70   Pulse 69   Ht 6' (1.829 m)   Wt 278 lb 6.4 oz (126.3 kg)   SpO2 93%   BMI 37.76 kg/m    Wt Readings from Last 3 Encounters:  04/25/23 278 lb 6.4 oz (126.3 kg)  04/23/23 278 lb 9.6 oz (126.4 kg)  02/03/23 277 lb (125.6 kg)    GEN: Well nourished, well developed in no acute distress NECK: No JVD; No carotid bruits CARDIAC: RRR, no murmurs, rubs, gallops RESPIRATORY:  Clear to auscultation  without rales, wheezing or rhonchi  ABDOMEN: Soft, non-tender, non-distended EXTREMITIES:  No edema; No deformity  ASSESSMENT AND PLAN: .   Assessment and Plan    Coronary Artery Disease status post CABG No chest pain or dyspnea. Patient is not interested in taking cholesterol medication despite the benefits in secondary prevention of CAD. -Continue Aspirin and restart Eliquis for dual antiplatelet therapy for 1 year post-CABG, then transition to Eliquis monotherapy. -Continue Metoprolol Tartrate.  Hypertension Elevated blood pressure at today's visit, but patient reports normal readings at home. -Continue Metoprolol Tartrate. -continue imdur  Atrial Fibrillation, persistent  Currently in sinus rhythm post cardioversion. EF was 40-45% post-AFib. -Continue Metoprolol Tartrate. -restart eliquis as this has fallen off.  -Order repeat echocardiogram to reassess EF.  Tobacco Use Patient continues to smoke and is not interested in cessation. -Provided 3 minutes of smoking cessation counseling.  Follow-up in 6 months.              Follow-up: Return in about 6 months (around 10/24/2023).  Time Spent with Patient: I have spent a total of 35 minutes caring for this patient today face to face, ordering and reviewing labs/tests, reviewing prior records/medical history, examining the patient, establishing an assessment and plan, communicating results/findings to the patient/family, and documenting in the medical record.   Signed, Lenna Gilford. Flora Lipps, MD, [facility]   [facility]  [address]  [address] [place], [place] [address] [phone]  3:02 PM

## 2023-04-25 ENCOUNTER — Encounter: Payer: Self-pay | Admitting: Cardiovascular Disease

## 2023-04-25 ENCOUNTER — Ambulatory Visit: Payer: 59 | Attending: Cardiovascular Disease | Admitting: Cardiovascular Disease

## 2023-04-25 VITALS — BP 150/70 | HR 69 | Ht 72.0 in | Wt 278.4 lb

## 2023-04-25 DIAGNOSIS — I150 Renovascular hypertension: Secondary | ICD-10-CM

## 2023-04-25 DIAGNOSIS — F17210 Nicotine dependence, cigarettes, uncomplicated: Secondary | ICD-10-CM | POA: Diagnosis not present

## 2023-04-25 DIAGNOSIS — I10 Essential (primary) hypertension: Secondary | ICD-10-CM

## 2023-04-25 DIAGNOSIS — I480 Paroxysmal atrial fibrillation: Secondary | ICD-10-CM

## 2023-04-25 DIAGNOSIS — E785 Hyperlipidemia, unspecified: Secondary | ICD-10-CM | POA: Diagnosis not present

## 2023-04-25 DIAGNOSIS — Z720 Tobacco use: Secondary | ICD-10-CM

## 2023-04-25 DIAGNOSIS — I25708 Atherosclerosis of coronary artery bypass graft(s), unspecified, with other forms of angina pectoris: Secondary | ICD-10-CM

## 2023-04-25 MED ORDER — APIXABAN 5 MG PO TABS: 5.0000 mg | ORAL_TABLET | Freq: Two times a day (BID) | ORAL | 3 refills | Status: DC

## 2023-04-25 NOTE — Patient Instructions (Signed)
Medication Instructions:  Start: Apixaban (Eliquis) 5 mg two times daily *If you need a refill on your cardiac medications before your next appointment, please call your pharmacy*  Lab Work: None  Testing/Procedures: Your physician has requested that you have an echocardiogram. Echocardiography is a painless test that uses sound waves to create images of your heart. It provides your doctor with information about the size and shape of your heart and how well your heart's chambers and valves are working. This procedure takes approximately one hour. There are no restrictions for this procedure. Please do NOT wear cologne, perfume, aftershave, or lotions (deodorant is allowed). Please arrive 15 minutes prior to your appointment time. This will take place at [address] [place]   Follow-Up: At [facility], you and your health needs are our priority.  As part of our continuing mission to provide you with exceptional heart care, we have created designated Provider Care Teams.  These Care Teams include your primary Cardiologist (physician) and Advanced Practice Providers (APPs -  Physician Assistants and Nurse Practitioners) who all work together to provide you with the care you need, when you need it.  Your next appointment:   6 month(s)  Provider:   Azalee Course, PA or Reather Littler, NP

## 2023-05-28 ENCOUNTER — Ambulatory Visit (HOSPITAL_COMMUNITY): Payer: 59 | Attending: Cardiology

## 2023-05-28 DIAGNOSIS — E785 Hyperlipidemia, unspecified: Secondary | ICD-10-CM | POA: Diagnosis not present

## 2023-05-28 DIAGNOSIS — I25708 Atherosclerosis of coronary artery bypass graft(s), unspecified, with other forms of angina pectoris: Secondary | ICD-10-CM | POA: Insufficient documentation

## 2023-05-28 DIAGNOSIS — I10 Essential (primary) hypertension: Secondary | ICD-10-CM | POA: Diagnosis not present

## 2023-05-28 LAB — ECHOCARDIOGRAM COMPLETE
Area-P 1/2: 3.39 cm2
S' Lateral: 4 cm

## 2023-05-28 MED ORDER — PERFLUTREN LIPID MICROSPHERE
1.0000 mL | INTRAVENOUS | Status: AC | PRN
  Administered 2023-05-28: 2 mL via INTRAVENOUS

## 2023-06-07 ENCOUNTER — Emergency Department (HOSPITAL_BASED_OUTPATIENT_CLINIC_OR_DEPARTMENT_OTHER): Payer: 59

## 2023-06-07 ENCOUNTER — Encounter (HOSPITAL_BASED_OUTPATIENT_CLINIC_OR_DEPARTMENT_OTHER): Payer: Self-pay | Admitting: Emergency Medicine

## 2023-06-07 ENCOUNTER — Other Ambulatory Visit: Payer: Self-pay

## 2023-06-07 ENCOUNTER — Emergency Department (HOSPITAL_BASED_OUTPATIENT_CLINIC_OR_DEPARTMENT_OTHER)
Admission: EM | Admit: 2023-06-07 | Discharge: 2023-06-07 | Disposition: A | Discharge status: Home / Self Care | Payer: 59 | Attending: Emergency Medicine | Admitting: Emergency Medicine

## 2023-06-07 DIAGNOSIS — R000 Tachycardia, unspecified: Secondary | ICD-10-CM | POA: Diagnosis not present

## 2023-06-07 DIAGNOSIS — I10 Essential (primary) hypertension: Secondary | ICD-10-CM | POA: Insufficient documentation

## 2023-06-07 DIAGNOSIS — I2510 Atherosclerotic heart disease of native coronary artery without angina pectoris: Secondary | ICD-10-CM | POA: Insufficient documentation

## 2023-06-07 DIAGNOSIS — J441 Chronic obstructive pulmonary disease with (acute) exacerbation: Secondary | ICD-10-CM | POA: Diagnosis not present

## 2023-06-07 DIAGNOSIS — Z7901 Long term (current) use of anticoagulants: Secondary | ICD-10-CM | POA: Insufficient documentation

## 2023-06-07 DIAGNOSIS — R059 Cough, unspecified: Secondary | ICD-10-CM | POA: Diagnosis not present

## 2023-06-07 DIAGNOSIS — E119 Type 2 diabetes mellitus without complications: Secondary | ICD-10-CM | POA: Insufficient documentation

## 2023-06-07 DIAGNOSIS — Z79899 Other long term (current) drug therapy: Secondary | ICD-10-CM | POA: Insufficient documentation

## 2023-06-07 DIAGNOSIS — Z20822 Contact with and (suspected) exposure to covid-19: Secondary | ICD-10-CM | POA: Insufficient documentation

## 2023-06-07 DIAGNOSIS — R0602 Shortness of breath: Secondary | ICD-10-CM | POA: Diagnosis not present

## 2023-06-07 DIAGNOSIS — Z7982 Long term (current) use of aspirin: Secondary | ICD-10-CM | POA: Diagnosis not present

## 2023-06-07 DIAGNOSIS — R0902 Hypoxemia: Secondary | ICD-10-CM | POA: Insufficient documentation

## 2023-06-07 DIAGNOSIS — F17210 Nicotine dependence, cigarettes, uncomplicated: Secondary | ICD-10-CM | POA: Diagnosis not present

## 2023-06-07 LAB — CBC WITH DIFFERENTIAL/PLATELET
Abs Immature Granulocytes: 0.01 10*3/uL (ref 0.00–0.07)
Basophils Absolute: 0 10*3/uL (ref 0.0–0.1)
Basophils Relative: 0 %
Eosinophils Absolute: 0 10*3/uL (ref 0.0–0.5)
Eosinophils Relative: 1 %
HCT: 50.4 % (ref 39.0–52.0)
Hemoglobin: 17.3 g/dL — ABNORMAL HIGH (ref 13.0–17.0)
Immature Granulocytes: 0 %
Lymphocytes Relative: 15 %
Lymphs Abs: 1.1 10*3/uL (ref 0.7–4.0)
MCH: 30.1 pg (ref 26.0–34.0)
MCHC: 34.3 g/dL (ref 30.0–36.0)
MCV: 87.7 fL (ref 80.0–100.0)
Monocytes Absolute: 0.8 10*3/uL (ref 0.1–1.0)
Monocytes Relative: 11 %
Neutro Abs: 5 10*3/uL (ref 1.7–7.7)
Neutrophils Relative %: 73 %
Platelets: 127 10*3/uL — ABNORMAL LOW (ref 150–400)
RBC: 5.75 MIL/uL (ref 4.22–5.81)
RDW: 14.6 % (ref 11.5–15.5)
WBC: 7 10*3/uL (ref 4.0–10.5)
nRBC: 0 % (ref 0.0–0.2)

## 2023-06-07 LAB — COMPREHENSIVE METABOLIC PANEL
ALT: 23 U/L (ref 0–44)
AST: 18 U/L (ref 15–41)
Albumin: 4.5 g/dL (ref 3.5–5.0)
Alkaline Phosphatase: 64 U/L (ref 38–126)
Anion gap: 11 (ref 5–15)
BUN: 17 mg/dL (ref 8–23)
CO2: 23 mmol/L (ref 22–32)
Calcium: 9 mg/dL (ref 8.9–10.3)
Chloride: 101 mmol/L (ref 98–111)
Creatinine, Ser: 0.85 mg/dL (ref 0.61–1.24)
GFR, Estimated: >60 mL/min (ref >60)
Glucose, Bld: 162 mg/dL — ABNORMAL HIGH (ref 70–99)
Potassium: 3.9 mmol/L (ref 3.5–5.1)
Sodium: 135 mmol/L (ref 135–145)
Total Bilirubin: 1.9 mg/dL — ABNORMAL HIGH (ref 0.0–1.2)
Total Protein: 8 g/dL (ref 6.5–8.1)

## 2023-06-07 LAB — RESP PANEL BY RT-PCR (RSV, FLU A&B, COVID)  RVPGX2
Influenza A by PCR: NEGATIVE
Influenza B by PCR: NEGATIVE
Resp Syncytial Virus by PCR: NEGATIVE
SARS Coronavirus 2 by RT PCR: NEGATIVE

## 2023-06-07 LAB — TROPONIN I (HIGH SENSITIVITY)
Troponin I (High Sensitivity): 9 ng/L (ref <18)
Troponin I (High Sensitivity): 9 ng/L (ref <18)

## 2023-06-07 LAB — BRAIN NATRIURETIC PEPTIDE: B Natriuretic Peptide: 34.1 pg/mL (ref 0.0–100.0)

## 2023-06-07 MED ORDER — IPRATROPIUM-ALBUTEROL 0.5-2.5 (3) MG/3ML IN SOLN
3.0000 mL | Freq: Once | RESPIRATORY_TRACT | Status: AC
  Administered 2023-06-07: 3 mL via RESPIRATORY_TRACT
  Filled 2023-06-07: qty 3

## 2023-06-07 MED ORDER — SODIUM CHLORIDE 0.9 % IV BOLUS
1000.0000 mL | Freq: Once | INTRAVENOUS | Status: AC
  Administered 2023-06-07: 1000 mL via INTRAVENOUS

## 2023-06-07 MED ORDER — METHYLPREDNISOLONE SODIUM SUCC 125 MG IJ SOLR
125.0000 mg | Freq: Once | INTRAMUSCULAR | Status: AC
  Administered 2023-06-07: 125 mg via INTRAVENOUS
  Filled 2023-06-07: qty 2

## 2023-06-07 MED ORDER — PREDNISONE 10 MG PO TABS: ORAL_TABLET | ORAL | 0 refills | Status: AC

## 2023-06-07 MED ORDER — ALBUTEROL SULFATE HFA 108 (90 BASE) MCG/ACT IN AERS: 2.0000 | INHALATION_SPRAY | RESPIRATORY_TRACT | 2 refills | Status: DC | PRN

## 2023-06-07 MED ORDER — IOHEXOL 350 MG/ML SOLN
75.0000 mL | Freq: Once | INTRAVENOUS | Status: AC | PRN
  Administered 2023-06-07: 75 mL via INTRAVENOUS

## 2023-06-07 MED ORDER — ALBUTEROL SULFATE HFA 108 (90 BASE) MCG/ACT IN AERS
2.0000 | INHALATION_SPRAY | Freq: Once | RESPIRATORY_TRACT | Status: AC
  Administered 2023-06-07: 2 via RESPIRATORY_TRACT
  Filled 2023-06-07: qty 6.7

## 2023-06-07 NOTE — ED Notes (Signed)
Pt alert and oriented X 4 at the time of discharge. RR even and unlabored. No acute distress noted. Pt verbalized understanding of discharge instructions as discussed. Pt ambulatory to lobby at time of discharge.

## 2023-06-07 NOTE — ED Notes (Signed)
Pt's wife Eusevio Schriver asked to be called with status changes she is going home to rest for a little bit.

## 2023-06-07 NOTE — ED Notes (Signed)
Patient returned from CT. Pt in position of comfort on locked stretcher in lowest position, call bell in reach & family at bedside.

## 2023-06-07 NOTE — ED Triage Notes (Signed)
Pt states had cough for two weeks that went away, new dry cough started yesterday. Woke up SOB high heart rate and headache due to cough. Pt 88% room air.

## 2023-06-07 NOTE — ED Notes (Signed)
RT Note:  Patient oxygen saturation on room air while ambulating 91-92%, HR 123, RR 23 Patient stated he feels better up and moving

## 2023-06-07 NOTE — ED Notes (Signed)
Patient transported to CT

## 2023-06-07 NOTE — Discharge Instructions (Addendum)
We discussed your low oxygen level today, and your breathing problems.  This is likely due to COPD flareup.  I did encourage you to stay for observation in the hospital with your borderline low oxygen levels, but you did not want to stay.  You do understand by choosing to go home, that you may get potentially sicker this week, your oxygen levels may drop, which may put you at risk for dizziness, loss of consciousness, stroke and death.  You told me you have an oxygen monitor at home.  I would encourage you to return to the emergency department if your oxygen level is dropping below 88% at rest -or if you have labored breathing, chest pressure, loss of consciousness, or any other new emergency concerns.  In the meantime, you will begin taking prednisone steroids tomorrow morning.  Take these on a taper as prescribed.  You will also use the albuterol inhaler that was given, taking 2 puffs every 4 hours, for the next 2 days.  After that he can go back to using the inhaler "as needed" for wheezing and shortness of breath, once every 4 hours.  Schedule a follow-up appointment with your primary care clinic if possible in the next 2 to 3 days to see how you are doing with your breathing.
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
## 2023-06-07 NOTE — ED Notes (Signed)
RT Note : Patient given an Albuterol inhaler with a spacer and educated on the use of the spacer

## 2023-06-07 NOTE — ED Provider Notes (Signed)
63 yo male here with hypoxia, SOB, tachypnea Suspected COPD exacberation Duoneb, steriods given, IV fluids Covid/flu test negative Labs otherwise stable  Pending CT PE  Physical Exam  BP (!) 161/86 (BP Location: Left Wrist)   Pulse (!) 103   Temp 98.4 F (36.9 C) (Oral)   Resp (!) 30   Ht 6' (1.829 m)   Wt 124.7 kg   SpO2 97%   BMI 37.30 kg/m   Physical Exam  Procedures  Procedures  ED Course / MDM   Clinical Course as of 06/07/23 0816  Sat Jun 07, 2023  0700 Pulse ox 89-90% at rest on RA [SG]    Clinical Course User Index [SG] Sloan Leiter, DO   Medical Decision Making Amount and/or Complexity of Data Reviewed Labs: ordered. Radiology: ordered.  Risk Prescription drug management.   I reassessed the patient after reviewing his workup.  CT PE study was negative.  The patient had intermittently required 2 to 3 L nasal cannula at rest, but we did an ambulatory pulse ox without supplemental oxygen and he maintained saturations above 92%.  He is pretty adamant he does not want to stay in the hospital.  They do have an oxygen pulse ox monitor at home.  His wife will also be staying with him.  In this case we will give him an albuterol inhaler which she will use 2 puffs every 4 hours, and I will prescribe a longer taper of steroids given his extensive likely emphysema history.  Strongly encouraged him to discontinue smoking.  Advised if his oxygen level drops at home he needs to return to the hospital.  Based on his borderline hypoxia, his extensive smoking history, I believe there is a fairly high likelihood that he may require hospitalization at some point this week, and he and his wife both understand this.  However, we engaged in shared medical decision making, and I think they have the ability to appropriately monitor his breathing at home, and he is stable at this time for discharge.       Terald Sleeper, MD 06/07/23 [phone]

## 2023-06-07 NOTE — ED Notes (Signed)
RT Note:  Patient oxygen saturation on room air while at rest =88% Patient oxygen saturation on room air moving from bed to chair = 85%  Patient was placed on 3lpm [place] and oxygen saturation increased to 94%   Patient tolerating 3lpm [place] well at this time with no noted distress.

## 2023-06-09 ENCOUNTER — Emergency Department (HOSPITAL_COMMUNITY)
Admission: EM | Admit: 2023-06-09 | Discharge: 2023-06-09 | Discharge status: Against Medical Advice | Payer: 59 | Attending: Emergency Medicine | Admitting: Emergency Medicine

## 2023-06-09 ENCOUNTER — Ambulatory Visit: Payer: Self-pay | Admitting: Internal Medicine

## 2023-06-09 ENCOUNTER — Other Ambulatory Visit: Payer: Self-pay

## 2023-06-09 ENCOUNTER — Encounter: Payer: Self-pay | Admitting: Family Medicine

## 2023-06-09 ENCOUNTER — Ambulatory Visit (INDEPENDENT_AMBULATORY_CARE_PROVIDER_SITE_OTHER): Payer: 59 | Admitting: Family Medicine

## 2023-06-09 ENCOUNTER — Emergency Department (HOSPITAL_COMMUNITY): Payer: 59

## 2023-06-09 ENCOUNTER — Encounter (HOSPITAL_COMMUNITY): Payer: Self-pay

## 2023-06-09 VITALS — BP 134/78 | HR 90 | Temp 97.8°F | Resp 20 | Ht <= 58 in | Wt 278.0 lb

## 2023-06-09 DIAGNOSIS — R059 Cough, unspecified: Secondary | ICD-10-CM | POA: Insufficient documentation

## 2023-06-09 DIAGNOSIS — R0602 Shortness of breath: Secondary | ICD-10-CM | POA: Insufficient documentation

## 2023-06-09 DIAGNOSIS — R519 Headache, unspecified: Secondary | ICD-10-CM | POA: Insufficient documentation

## 2023-06-09 DIAGNOSIS — G4733 Obstructive sleep apnea (adult) (pediatric): Secondary | ICD-10-CM

## 2023-06-09 DIAGNOSIS — J9811 Atelectasis: Secondary | ICD-10-CM | POA: Diagnosis not present

## 2023-06-09 DIAGNOSIS — J441 Chronic obstructive pulmonary disease with (acute) exacerbation: Secondary | ICD-10-CM | POA: Diagnosis not present

## 2023-06-09 DIAGNOSIS — Z5321 Procedure and treatment not carried out due to patient leaving prior to being seen by health care provider: Secondary | ICD-10-CM | POA: Diagnosis not present

## 2023-06-09 LAB — CBC
HCT: 49.1 % (ref 39.0–52.0)
Hemoglobin: 16.4 g/dL (ref 13.0–17.0)
MCH: 30.5 pg (ref 26.0–34.0)
MCHC: 33.4 g/dL (ref 30.0–36.0)
MCV: 91.3 fL (ref 80.0–100.0)
Platelets: 137 10*3/uL — ABNORMAL LOW (ref 150–400)
RBC: 5.38 MIL/uL (ref 4.22–5.81)
RDW: 14.7 % (ref 11.5–15.5)
WBC: 7.5 10*3/uL (ref 4.0–10.5)
nRBC: 0 % (ref 0.0–0.2)

## 2023-06-09 LAB — BASIC METABOLIC PANEL
Anion gap: 13 (ref 5–15)
BUN: 26 mg/dL — ABNORMAL HIGH (ref 8–23)
CO2: 22 mmol/L (ref 22–32)
Calcium: 8.8 mg/dL — ABNORMAL LOW (ref 8.9–10.3)
Chloride: 102 mmol/L (ref 98–111)
Creatinine, Ser: 0.81 mg/dL (ref 0.61–1.24)
GFR, Estimated: >60 mL/min (ref >60)
Glucose, Bld: 257 mg/dL — ABNORMAL HIGH (ref 70–99)
Potassium: 4.1 mmol/L (ref 3.5–5.1)
Sodium: 137 mmol/L (ref 135–145)

## 2023-06-09 MED ORDER — AMOXICILLIN-POT CLAVULANATE 875-125 MG PO TABS: 1.0000 | ORAL_TABLET | Freq: Two times a day (BID) | ORAL | 0 refills | Status: AC

## 2023-06-09 MED ORDER — BUDESONIDE-FORMOTEROL FUMARATE 160-4.5 MCG/ACT IN AERO: 2.0000 | INHALATION_SPRAY | Freq: Two times a day (BID) | RESPIRATORY_TRACT | 2 refills | Status: DC

## 2023-06-09 NOTE — Telephone Encounter (Signed)
I was able to speak with pt and get him scheduled with Deliah Boston for this afternoon at 2:20pm.

## 2023-06-09 NOTE — Telephone Encounter (Signed)
Copied from CRM [id number]. Topic: Clinical - Red Word Triage >> Jun 09, 2023  7:51 AM Dimitri Ped wrote: [facility] that prompted transfer to Nurse Triage: was in emergency room and having breathing problems  Chief Complaint: breathing difficulty  Symptoms: SOB, wheezing, O2 in 80's when first woke up - used inhaler and now O2@90  Frequency: Friday Pertinent Negatives: Patient denies chest pain Disposition: [x] ED /[] Urgent Care (no appt availability in office) / [] Appointment(In office/virtual)/ []  [facility]/ [] Home Care/ [] Refused Recommended Disposition /[] [facility] [facility]/ []  Follow-up with PCP Additional Notes: patient went to ED on Saturday refused to be admitted: told ED to him Rx and let him go home.  Called in today to make appt w/PCP: nurse refused to make appt due SOB: referred pt back to ED & provided pt with education of importance of going to ED: pt verbalized understanding.  Reason for Disposition  Oxygen level (e.g., pulse oximetry) 90 percent or lower  Answer Assessment - Initial Assessment Questions 1. RESPIRATORY STATUS: "Describe your breathing?" (e.g., wheezing, shortness of breath, unable to speak, severe coughing)      wheezing 2. ONSET: "When did this breathing problem begin?"      Friday - went to ER Sat.O2 80's needed breathing tx & steroids 3. PATTERN "Does the difficult breathing come and go, or has it been constant since it started?"      Constant  4. SEVERITY: "How bad is your breathing?" (e.g., mild, moderate, severe)    - MILD: No SOB at rest, mild SOB with walking, speaks normally in sentences, can lie down, no retractions, pulse < 100.    - MODERATE: SOB at rest, SOB with minimal exertion and prefers to sit, cannot lie down flat, speaks in phrases, mild retractions, audible wheezing, pulse 100-120.    - SEVERE: Very SOB at rest, speaks in single words, struggling to breathe, sitting hunched forward, retractions, pulse > 120      Moderate to  severe 5. RECURRENT SYMPTOM: "Have you had difficulty breathing before?" If Yes, ask: "When was the last time?" and "What happened that time?"      Yes on Friday 6. CARDIAC HISTORY: "Do you have any history of heart disease?" (e.g., heart attack, angina, bypass surgery, angioplasty)      N/a 7. LUNG HISTORY: "Do you have any history of lung disease?"  (e.g., pulmonary embolus, asthma, emphysema)     N/a 8. CAUSE: "What do you think is causing the breathing problem?"      unknown 9. OTHER SYMPTOMS: "Do you have any other symptoms? (e.g., dizziness, runny nose, cough, chest pain, fever)     N/a 10. O2 SATURATION MONITOR:  "Do you use an oxygen saturation monitor (pulse oximeter) at home?" If Yes, ask: "What is your reading (oxygen level) today?" "What is your usual oxygen saturation reading?" (e.g., 95%)       90 @0759  - when first wake up O2 80 before using albuterol 11. PREGNANCY: "Is there any chance you are pregnant?" "When was your last menstrual period?"       no 12. TRAVEL: "Have you traveled out of the country in the last month?" (e.g., travel history, exposures)       no  Protocols used: Breathing Difficulty-A-AH

## 2023-06-09 NOTE — Progress Notes (Signed)
Assessment & Plan:  1. COPD exacerbation (HCC) (Primary) Education provided on COPD exacerbations. Discussed and demonstrated the proper technique in using his Albuterol and Symbicort inhalers. Discussed the differences in how each inhaler works and that Symbicort is the maintenance and Albuterol is the rescue. Patient verbalized understanding and will start using Symbicort twice daily as prescribed.  - amoxicillin-clavulanate (AUGMENTIN) 875-125 MG tablet; Take 1 tablet by mouth 2 (two) times daily for 7 days.  Dispense: 14 tablet; Refill: 0 - budesonide-formoterol (SYMBICORT) 160-4.5 MCG/ACT inhaler; Inhale 2 puffs into the lungs 2 (two) times daily. INHALE 2 PUFS INTO THE LUNGS TWICE A DAY  Dispense: 10.2 each; Refill: 2  2. OSA (obstructive sleep apnea) Referring to pulmonology for updated sleep study.  - Ambulatory referral to Pulmonology    Follow up plan: Return if symptoms worsen or fail to improve.  Deliah Boston, MSN, APRN, FNP-C  Subjective:  HPI: [name] is a 63 y.o. male presenting on 06/09/2023 for COPD (Flare - up /Sob - worse int he morning /Went to ER SAT and again this morning - left due to wait )  Patient is accompanied by his wife, who he is okay with being present.  Patient complains of cough, shortness of breath, and wheezing. His cough is productive of tan sputum, which is his usual. His wheezing and shortness of breath are worse than usual. Onset of symptoms was 4 days ago, gradually worsening since that time. He is drinking plenty of fluids. Evaluation to date: previously seen in the ER where he was prescribed prednisone and an Albuterol inhaler. He has a history of COPD and was previously prescribed Symbicort which he only uses as needed. He does smoke.   He is also concerned about his sleep apnea and the settings. States he has had the same machine and settings since he was diagnosed 15 years ago and 70 lbs heavier.     ROS: Negative unless  specifically indicated above in HPI.   Relevant past medical history reviewed and updated as indicated.   Allergies and medications reviewed and updated.   Current Outpatient Medications:    albuterol (VENTOLIN HFA) 108 (90 Base) MCG/ACT inhaler, Inhale 2 puffs into the lungs every 4 (four) hours as needed for wheezing or shortness of breath., Disp: 8 g, Rfl: 2   ALPRAZolam (XANAX) 1 MG tablet, TAKE 1 TABLET BY MOUTH 2 TIMES DAILY AS NEEDED., Disp: 60 tablet, Rfl: 1   aspirin EC 81 MG tablet, Take 81 mg by mouth daily. Swallow whole., Disp: , Rfl:    budesonide-formoterol (SYMBICORT) 160-4.5 MCG/ACT inhaler, INHALE 2 PUFS INTO THE LUNGS TWICE A DAY, Disp: 10.2 each, Rfl: 11   [START ON 03/10/2024] diclofenac (VOLTAREN) 75 MG EC tablet, Take 1 tablet (75 mg total) by mouth 2 (two) times daily as needed. Take 75 mg by mouth 2 (two) times daily as needed., Disp: 180 tablet, Rfl: 1   isosorbide mononitrate (IMDUR) 30 MG 24 hr tablet, Take 1 tablet (30 mg total) by mouth daily., Disp: 90 tablet, Rfl: 3   metoprolol tartrate (LOPRESSOR) 50 MG tablet, Take 1 tablet (50 mg total) by mouth 2 (two) times daily., Disp: 180 tablet, Rfl: 1   predniSONE (DELTASONE) 10 MG tablet, Take 6 tablets (60 mg total) by mouth daily with breakfast for 4 days, THEN 4 tablets (40 mg total) daily with breakfast for 2 days, THEN 2 tablets (20 mg total) daily with breakfast for 2 days., Disp: 15 tablet, Rfl: 0  apixaban (ELIQUIS) 5 MG TABS tablet, Take 1 tablet (5 mg total) by mouth 2 (two) times daily. (Patient not taking: Reported on 06/09/2023), Disp: 180 tablet, Rfl: 3   Blood Glucose Monitoring Suppl (ONE TOUCH ULTRA 2) w/Device KIT, Use to check blood sugars twice a day (Patient not taking: Reported on 06/09/2023), Disp: 1 kit, Rfl: 0   glucose blood (ONETOUCH ULTRA) test strip, Use to check blood sugars twice a day (Patient not taking: Reported on 06/09/2023), Disp: 100 each, Rfl: 12   OneTouch UltraSoft 2 Lancets MISC, 1  Lancet by Does not apply route 2 (two) times daily. (Patient not taking: Reported on 04/25/2023), Disp: 100 each, Rfl: 3  Allergies  Allergen Reactions   Ace Inhibitors    Lisinopril     cough   Paxlovid [Nirmatrelvir-Ritonavir]     ?rash   Tramadol     Pt had seizures while on it    Objective:   BP 134/78   Pulse 90   Temp 97.8 F (36.6 C)   Resp 20   Ht 1' (0.305 m)   Wt 278 lb (126.1 kg)   HC 6" (15.2 cm)   SpO2 90%   BMI 1357.33 kg/m    Physical Exam Vitals reviewed.  Constitutional:      General: He is not in acute distress.    Appearance: Normal appearance. He is not ill-appearing, toxic-appearing or diaphoretic.  HENT:     Head: Normocephalic and atraumatic.  Eyes:     General: No scleral icterus.       Right eye: No discharge.        Left eye: No discharge.     Conjunctiva/sclera: Conjunctivae normal.  Cardiovascular:     Rate and Rhythm: Normal rate and regular rhythm.     Heart sounds: Normal heart sounds. No murmur heard.    No friction rub. No gallop.  Pulmonary:     Effort: Pulmonary effort is normal. No respiratory distress.     Breath sounds: Normal breath sounds. No stridor. No wheezing, rhonchi or rales.  Musculoskeletal:        General: Normal range of motion.     Cervical back: Normal range of motion.  Skin:    General: Skin is warm and dry.  Neurological:     Mental Status: He is alert and oriented to person, place, and time. Mental status is at baseline.  Psychiatric:        Mood and Affect: Mood normal.        Behavior: Behavior normal.        Thought Content: Thought content normal.        Judgment: Judgment normal.

## 2023-06-09 NOTE — ED Triage Notes (Signed)
Patient reports cough, SOB, and headache x 3 days. Reports home oxygen was 88. Took prednisone and inhaler and O2 went up. O2 92 on arrival.  Patient was seen for the same 1/25. Was told he should be admitted, but patient refused at the time.

## 2023-07-22 ENCOUNTER — Ambulatory Visit (INDEPENDENT_AMBULATORY_CARE_PROVIDER_SITE_OTHER): Payer: 59 | Admitting: Internal Medicine

## 2023-07-22 ENCOUNTER — Encounter: Payer: Self-pay | Admitting: Internal Medicine

## 2023-07-22 VITALS — BP 120/70 | HR 61 | Temp 98.6°F | Ht 72.0 in | Wt 280.0 lb

## 2023-07-22 DIAGNOSIS — J449 Chronic obstructive pulmonary disease, unspecified: Secondary | ICD-10-CM | POA: Diagnosis not present

## 2023-07-22 DIAGNOSIS — I25110 Atherosclerotic heart disease of native coronary artery with unstable angina pectoris: Secondary | ICD-10-CM

## 2023-07-22 DIAGNOSIS — E669 Obesity, unspecified: Secondary | ICD-10-CM

## 2023-07-22 DIAGNOSIS — I10 Essential (primary) hypertension: Secondary | ICD-10-CM

## 2023-07-22 DIAGNOSIS — E1169 Type 2 diabetes mellitus with other specified complication: Secondary | ICD-10-CM | POA: Diagnosis not present

## 2023-07-22 DIAGNOSIS — F419 Anxiety disorder, unspecified: Secondary | ICD-10-CM

## 2023-07-22 LAB — COMPREHENSIVE METABOLIC PANEL
ALT: 24 U/L (ref 0–53)
AST: 15 U/L (ref 0–37)
Albumin: 4.5 g/dL (ref 3.5–5.2)
Alkaline Phosphatase: 64 U/L (ref 39–117)
BUN: 18 mg/dL (ref 6–23)
CO2: 28 meq/L (ref 19–32)
Calcium: 9.7 mg/dL (ref 8.4–10.5)
Chloride: 104 meq/L (ref 96–112)
Creatinine, Ser: 0.93 mg/dL (ref 0.40–1.50)
GFR: 88.8 mL/min (ref >60.00)
Glucose, Bld: 155 mg/dL — ABNORMAL HIGH (ref 70–99)
Potassium: 4.5 meq/L (ref 3.5–5.1)
Sodium: 138 meq/L (ref 135–145)
Total Bilirubin: 1.3 mg/dL — ABNORMAL HIGH (ref 0.2–1.2)
Total Protein: 7 g/dL (ref 6.0–8.3)

## 2023-07-22 LAB — HEMOGLOBIN A1C: Hgb A1c MFr Bld: 6.2 % (ref 4.6–6.5)

## 2023-07-22 MED ORDER — METOPROLOL TARTRATE 50 MG PO TABS: 50.0000 mg | ORAL_TABLET | Freq: Two times a day (BID) | ORAL | 1 refills | Status: DC

## 2023-07-22 MED ORDER — ALPRAZOLAM 1 MG PO TABS: ORAL_TABLET | ORAL | 1 refills | Status: DC

## 2023-07-22 MED ORDER — ISOSORBIDE MONONITRATE ER 30 MG PO TB24: 30.0000 mg | ORAL_TABLET | Freq: Every day | ORAL | 3 refills | Status: DC

## 2023-07-22 NOTE — Assessment & Plan Note (Signed)
 Cont on Lipitor, ASA, Imdur, Lopressor

## 2023-07-22 NOTE — Assessment & Plan Note (Signed)
 Symbicort MDI Stop smoking

## 2023-07-22 NOTE — Progress Notes (Signed)
 Subjective:  Patient ID: [name], male    DOB: 05/04/1961  Age: 63 y.o. MRN: [id number]  CC: Medical Management of Chronic Issues (3 MNTH F/U)   HPI [name] presents for COPD - s/p ER visit on 05/2023, LBP, anxiety  Outpatient Medications Prior to Visit  Medication Sig Dispense Refill   albuterol (VENTOLIN HFA) 108 (90 Base) MCG/ACT inhaler Inhale 2 puffs into the lungs every 4 (four) hours as needed for wheezing or shortness of breath. 8 g 2   aspirin EC 81 MG tablet Take 81 mg by mouth daily. Swallow whole.     budesonide-formoterol (SYMBICORT) 160-4.5 MCG/ACT inhaler Inhale 2 puffs into the lungs 2 (two) times daily. INHALE 2 PUFS INTO THE LUNGS TWICE A DAY 10.2 each 2   [START ON 03/10/2024] diclofenac (VOLTAREN) 75 MG EC tablet Take 1 tablet (75 mg total) by mouth 2 (two) times daily as needed. Take 75 mg by mouth 2 (two) times daily as needed. 180 tablet 1   ALPRAZolam (XANAX) 1 MG tablet TAKE 1 TABLET BY MOUTH 2 TIMES DAILY AS NEEDED. 60 tablet 1   isosorbide mononitrate (IMDUR) 30 MG 24 hr tablet Take 1 tablet (30 mg total) by mouth daily. 90 tablet 3   metoprolol tartrate (LOPRESSOR) 50 MG tablet Take 1 tablet (50 mg total) by mouth 2 (two) times daily. 180 tablet 1   apixaban (ELIQUIS) 5 MG TABS tablet Take 1 tablet (5 mg total) by mouth 2 (two) times daily. (Patient not taking: Reported on 07/22/2023) 180 tablet 3   glucose blood (ONETOUCH ULTRA) test strip Use to check blood sugars twice a day (Patient not taking: Reported on 06/09/2023) 100 each 12   OneTouch UltraSoft 2 Lancets MISC 1 Lancet by Does not apply route 2 (two) times daily. (Patient not taking: Reported on 04/25/2023) 100 each 3   No facility-administered medications prior to visit.    ROS: Review of Systems  Constitutional:  Negative for appetite change, fatigue and unexpected weight change.  HENT:  Negative for congestion, nosebleeds, sneezing, sore throat and trouble  swallowing.   Eyes:  Positive for visual disturbance. Negative for itching.  Respiratory:  Negative for cough.   Cardiovascular:  Negative for chest pain, palpitations and leg swelling.  Gastrointestinal:  Negative for abdominal distention, blood in stool, diarrhea and nausea.  Genitourinary:  Negative for frequency and hematuria.  Musculoskeletal:  Positive for back pain. Negative for gait problem, joint swelling and neck pain.  Skin:  Negative for rash.  Neurological:  Negative for dizziness, tremors, speech difficulty and weakness.  Psychiatric/Behavioral:  Negative for agitation, dysphoric mood, sleep disturbance and suicidal ideas. The patient is not nervous/anxious.     Objective:  BP 120/70   Pulse 61   Temp 98.6 F (37 C) (Oral)   Ht 6' (1.829 m)   Wt 280 lb (127 kg)   SpO2 92%   BMI 37.97 kg/m   BP Readings from Last 3 Encounters:  07/22/23 120/70  06/09/23 134/78  06/09/23 126/74    Wt Readings from Last 3 Encounters:  07/22/23 280 lb (127 kg)  06/09/23 278 lb (126.1 kg)  06/09/23 275 lb (124.7 kg)    Physical Exam Constitutional:      General: He is not in acute distress.    Appearance: He is well-developed. He is obese.     Comments: NAD  Eyes:     Conjunctiva/sclera: Conjunctivae normal.     Pupils: Pupils are equal, round,  and reactive to light.  Neck:     Thyroid: No thyromegaly.     Vascular: No JVD.  Cardiovascular:     Rate and Rhythm: Normal rate and regular rhythm.     Heart sounds: Normal heart sounds. No murmur heard.    No friction rub. No gallop.  Pulmonary:     Effort: Pulmonary effort is normal. No respiratory distress.     Breath sounds: Normal breath sounds. No wheezing or rales.  Chest:     Chest wall: No tenderness.  Abdominal:     General: Bowel sounds are normal. There is no distension.     Palpations: Abdomen is soft. There is no mass.     Tenderness: There is no abdominal tenderness. There is no guarding or rebound.   Musculoskeletal:        General: No tenderness. Normal range of motion.     Cervical back: Normal range of motion.  Lymphadenopathy:     Cervical: No cervical adenopathy.  Skin:    General: Skin is warm and dry.     Findings: No rash.  Neurological:     Mental Status: He is alert and oriented to person, place, and time.     Cranial Nerves: No cranial nerve deficit.     Motor: No abnormal muscle tone.     Coordination: Coordination normal.     Gait: Gait normal.     Deep Tendon Reflexes: Reflexes are normal and symmetric.  Psychiatric:        Behavior: Behavior normal.        Thought Content: Thought content normal.        Judgment: Judgment normal.     Lab Results  Component Value Date   WBC 7.5 06/09/2023   HGB 16.4 06/09/2023   HCT 49.1 06/09/2023   PLT 137 (L) 06/09/2023   GLUCOSE 257 (H) 06/09/2023   CHOL 153 12/29/2022   TRIG 180 (H) 12/29/2022   HDL 33 (L) 12/29/2022   LDLDIRECT 91.0 08/08/2022   LDLCALC 84 12/29/2022   ALT 23 06/07/2023   AST 18 06/07/2023   NA 137 06/09/2023   K 4.1 06/09/2023   CL 102 06/09/2023   CREATININE 0.81 06/09/2023   BUN 26 (H) 06/09/2023   CO2 22 06/09/2023   TSH 2.337 01/04/2023   PSA 0.69 08/08/2022   INR 1.3 (H) 01/02/2023   HGBA1C 6.5 04/23/2023   MICROALBUR 14.5 (H) 04/23/2023    DG Chest 2 View Result Date: 06/09/2023 CLINICAL DATA:  Short of breath EXAM: CHEST - 2 VIEW COMPARISON:  CT 06/07/2023, chest radiograph 06/06/2018 FINDINGS: Normal mediastinum and cardiac silhouette. Normal pulmonary vasculature. No evidence of effusion, infiltrate, or pneumothorax. Mild lingular atelectasis. No acute bony abnormality. IMPRESSION: No active cardiopulmonary disease. Electronically Signed   By: Genevive Bi M.D.   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  Continue metoprolol.  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      Relevant Medications   ALPRAZolam (XANAX) 1 MG tablet   COPD mixed type (HCC)   Symbicort MDI Stop smoking         Meds ordered this encounter  Medications   ALPRAZolam (XANAX) 1 MG tablet    Sig: TAKE 1 TABLET BY MOUTH 2 TIMES DAILY AS NEEDED.    Dispense:  60 tablet    Refill:  1   metoprolol tartrate (LOPRESSOR) 50 MG tablet    Sig: Take 1 tablet (50 mg total) by mouth 2 (two) times daily.    Dispense:  180 tablet    Refill:  1   isosorbide mononitrate (IMDUR) 30 MG 24 hr tablet    Sig: Take 1 tablet (30 mg total) by mouth daily.    Dispense:  90 tablet    Refill:  3      Follow-up: Return in about 3 months (around 10/22/2023) for a follow-up visit.  Sonda Primes, MD

## 2023-07-22 NOTE — Assessment & Plan Note (Signed)
Chronic.  Continue metoprolol.  Pursue weight loss effort

## 2023-07-22 NOTE — Assessment & Plan Note (Signed)
Chronic Cont on Alprazolam prn  Potential benefits of a long term benzodiazepines  use as well as potential risks  and complications were explained to the patient and were aknowledged.

## 2023-07-22 NOTE — Assessment & Plan Note (Signed)
Off Metformin 

## 2023-07-23 ENCOUNTER — Encounter: Payer: Self-pay | Admitting: Internal Medicine

## 2023-09-01 ENCOUNTER — Ambulatory Visit: Admitting: Family Medicine

## 2023-10-22 ENCOUNTER — Encounter: Payer: Self-pay | Admitting: Internal Medicine

## 2023-10-22 ENCOUNTER — Ambulatory Visit (INDEPENDENT_AMBULATORY_CARE_PROVIDER_SITE_OTHER): Admitting: Internal Medicine

## 2023-10-22 VITALS — BP 130/70 | HR 53 | Temp 98.6°F | Ht 72.0 in | Wt 281.0 lb

## 2023-10-22 DIAGNOSIS — I25110 Atherosclerotic heart disease of native coronary artery with unstable angina pectoris: Secondary | ICD-10-CM

## 2023-10-22 DIAGNOSIS — E669 Obesity, unspecified: Secondary | ICD-10-CM | POA: Diagnosis not present

## 2023-10-22 DIAGNOSIS — I10 Essential (primary) hypertension: Secondary | ICD-10-CM

## 2023-10-22 DIAGNOSIS — E1169 Type 2 diabetes mellitus with other specified complication: Secondary | ICD-10-CM

## 2023-10-22 DIAGNOSIS — J441 Chronic obstructive pulmonary disease with (acute) exacerbation: Secondary | ICD-10-CM

## 2023-10-22 LAB — COMPREHENSIVE METABOLIC PANEL WITH GFR
ALT: 25 U/L (ref 0–53)
AST: 15 U/L (ref 0–37)
Albumin: 4.3 g/dL (ref 3.5–5.2)
Alkaline Phosphatase: 62 U/L (ref 39–117)
BUN: 16 mg/dL (ref 6–23)
CO2: 27 meq/L (ref 19–32)
Calcium: 9.2 mg/dL (ref 8.4–10.5)
Chloride: 107 meq/L (ref 96–112)
Creatinine, Ser: 0.82 mg/dL (ref 0.40–1.50)
GFR: 94.83 mL/min (ref >60.00)
Glucose, Bld: 151 mg/dL — ABNORMAL HIGH (ref 70–99)
Potassium: 4.4 meq/L (ref 3.5–5.1)
Sodium: 140 meq/L (ref 135–145)
Total Bilirubin: 1.3 mg/dL — ABNORMAL HIGH (ref 0.2–1.2)
Total Protein: 6.4 g/dL (ref 6.0–8.3)

## 2023-10-22 LAB — HEMOGLOBIN A1C: Hgb A1c MFr Bld: 6.6 % — ABNORMAL HIGH (ref 4.6–6.5)

## 2023-10-22 MED ORDER — ISOSORBIDE MONONITRATE ER 30 MG PO TB24: 30.0000 mg | ORAL_TABLET | Freq: Every day | ORAL | 3 refills | Status: AC

## 2023-10-22 MED ORDER — METOPROLOL TARTRATE 50 MG PO TABS: 50.0000 mg | ORAL_TABLET | Freq: Two times a day (BID) | ORAL | 3 refills | Status: AC

## 2023-10-22 MED ORDER — ALBUTEROL SULFATE HFA 108 (90 BASE) MCG/ACT IN AERS: 2.0000 | INHALATION_SPRAY | RESPIRATORY_TRACT | 5 refills | Status: AC | PRN

## 2023-10-22 MED ORDER — ALPRAZOLAM 1 MG PO TABS: ORAL_TABLET | ORAL | 1 refills | Status: DC

## 2023-10-22 MED ORDER — BUDESONIDE-FORMOTEROL FUMARATE 160-4.5 MCG/ACT IN AERO: 2.0000 | INHALATION_SPRAY | Freq: Two times a day (BID) | RESPIRATORY_TRACT | 5 refills | Status: AC

## 2023-10-22 MED ORDER — DICLOFENAC SODIUM 75 MG PO TBEC: 75.0000 mg | DELAYED_RELEASE_TABLET | Freq: Two times a day (BID) | ORAL | 1 refills | Status: AC | PRN

## 2023-10-22 NOTE — Assessment & Plan Note (Signed)
Off Metformin 

## 2023-10-22 NOTE — Assessment & Plan Note (Signed)
Chronic.  Continue metoprolol.  Pursue weight loss effort

## 2023-10-22 NOTE — Assessment & Plan Note (Signed)
 Cont on Lipitor, ASA, Imdur, Lopressor

## 2023-10-22 NOTE — Progress Notes (Signed)
 Subjective:  Patient ID: [name], male    DOB: [birth year]  Age: 63 y.o. MRN: [id number]  CC: Medical Management of Chronic Issues (3 MNTH F/U)   HPI [name] presents for anxiety, COPD, CAD  Outpatient Medications Prior to Visit  Medication Sig Dispense Refill   aspirin  EC 81 MG tablet Take 81 mg by mouth daily. Swallow whole.     albuterol  (VENTOLIN  HFA) 108 (90 Base) MCG/ACT inhaler Inhale 2 puffs into the lungs every 4 (four) hours as needed for wheezing or shortness of breath. 8 g 2   ALPRAZolam  (XANAX ) 1 MG tablet TAKE 1 TABLET BY MOUTH 2 TIMES DAILY AS NEEDED. 60 tablet 1   budesonide -formoterol  (SYMBICORT ) 160-4.5 MCG/ACT inhaler Inhale 2 puffs into the lungs 2 (two) times daily. INHALE 2 PUFS INTO THE LUNGS TWICE A DAY 10.2 each 2   [START ON 03/10/2024] diclofenac  (VOLTAREN ) 75 MG EC tablet Take 1 tablet (75 mg total) by mouth 2 (two) times daily as needed. Take 75 mg by mouth 2 (two) times daily as needed. 180 tablet 1   isosorbide  mononitrate (IMDUR ) 30 MG 24 hr tablet Take 1 tablet (30 mg total) by mouth daily. 90 tablet 3   metoprolol  tartrate (LOPRESSOR ) 50 MG tablet Take 1 tablet (50 mg total) by mouth 2 (two) times daily. 180 tablet 1   No facility-administered medications prior to visit.    ROS: Review of Systems  Constitutional:  Negative for appetite change, fatigue and unexpected weight change.  HENT:  Negative for congestion, nosebleeds, sneezing, sore throat and trouble swallowing.   Eyes:  Negative for itching and visual disturbance.  Respiratory:  Negative for cough.   Cardiovascular:  Negative for chest pain, palpitations and leg swelling.  Gastrointestinal:  Negative for abdominal distention, blood in stool, diarrhea and nausea.  Genitourinary:  Negative for frequency and hematuria.  Musculoskeletal:  Positive for back pain. Negative for gait problem, joint swelling and neck pain.  Skin:  Negative for rash.   Neurological:  Negative for dizziness, tremors, speech difficulty and weakness.  Psychiatric/Behavioral:  Negative for agitation, dysphoric mood and sleep disturbance. The patient is not nervous/anxious.     Objective:  BP 130/70   Pulse (!) 53   Temp 98.6 F (37 C) (Oral)   Ht 6' (1.829 m)   Wt 281 lb (127.5 kg)   SpO2 91%   BMI 38.11 kg/m   BP Readings from Last 3 Encounters:  10/22/23 130/70  07/22/23 120/70  06/09/23 134/78    Wt Readings from Last 3 Encounters:  10/22/23 281 lb (127.5 kg)  07/22/23 280 lb (127 kg)  06/09/23 278 lb (126.1 kg)    Physical Exam Constitutional:      General: He is not in acute distress.    Appearance: He is well-developed. He is obese.     Comments: NAD  Eyes:     Conjunctiva/sclera: Conjunctivae normal.     Pupils: Pupils are equal, round, and reactive to light.  Neck:     Thyroid : No thyromegaly.     Vascular: No JVD.  Cardiovascular:     Rate and Rhythm: Normal rate and regular rhythm.     Heart sounds: Normal heart sounds. No murmur heard.    No friction rub. No gallop.  Pulmonary:     Effort: Pulmonary effort is normal. No respiratory distress.     Breath sounds: Normal breath sounds. No wheezing or rales.  Chest:     Chest wall:  No tenderness.  Abdominal:     General: Bowel sounds are normal. There is no distension.     Palpations: Abdomen is soft. There is no mass.     Tenderness: There is no abdominal tenderness. There is no guarding or rebound.  Musculoskeletal:        General: No tenderness. Normal range of motion.     Cervical back: Normal range of motion.  Lymphadenopathy:     Cervical: No cervical adenopathy.  Skin:    General: Skin is warm and dry.     Findings: No rash.  Neurological:     Mental Status: He is alert and oriented to person, place, and time.     Cranial Nerves: No cranial nerve deficit.     Motor: No abnormal muscle tone.     Coordination: Coordination normal.     Gait: Gait normal.      Deep Tendon Reflexes: Reflexes are normal and symmetric.  Psychiatric:        Behavior: Behavior normal.        Thought Content: Thought content normal.        Judgment: Judgment normal.     Lab Results  Component Value Date   WBC 7.5 06/09/2023   HGB 16.4 06/09/2023   HCT 49.1 06/09/2023   PLT 137 (L) 06/09/2023   GLUCOSE 155 (H) 07/22/2023   CHOL 153 12/29/2022   TRIG 180 (H) 12/29/2022   HDL 33 (L) 12/29/2022   LDLDIRECT 91.0 08/08/2022   LDLCALC 84 12/29/2022   ALT 24 07/22/2023   AST 15 07/22/2023   NA 138 07/22/2023   K 4.5 07/22/2023   CL 104 07/22/2023   CREATININE 0.93 07/22/2023   BUN 18 07/22/2023   CO2 28 07/22/2023   TSH 2.337 01/04/2023   PSA 0.69 08/08/2022   INR 1.3 (H) 01/02/2023   HGBA1C 6.2 07/22/2023   MICROALBUR 14.5 (H) 04/23/2023    DG Chest 2 View Result Date: 06/09/2023 CLINICAL DATA:  Short of breath EXAM: CHEST - 2 VIEW COMPARISON:  CT 06/07/2023, chest radiograph 06/06/2018 FINDINGS: Normal mediastinum and cardiac silhouette. Normal pulmonary vasculature. No evidence of effusion, infiltrate, or pneumothorax. Mild lingular atelectasis. No acute bony abnormality. IMPRESSION: No active cardiopulmonary disease. Electronically Signed   By: Deboraha Fallow M.D.   On: 06/09/2023 11:11    Assessment & Plan:   Problem List Items Addressed This Visit     Essential hypertension - Primary (Chronic)   Chronic.  Continue metoprolol .  Pursue weight loss effort      Relevant Medications   isosorbide  mononitrate (IMDUR ) 30 MG 24 hr tablet   metoprolol  tartrate (LOPRESSOR ) 50 MG tablet   Other Relevant Orders   PSA   Hemoglobin A1c   Comprehensive metabolic panel with GFR   Coronary artery disease involving native coronary artery of native heart with unstable angina pectoris (HCC) (Chronic)   Cont on Lipitor , ASA, Imdur , Lopressor       Relevant Medications   isosorbide  mononitrate (IMDUR ) 30 MG 24 hr tablet   metoprolol  tartrate (LOPRESSOR ) 50 MG  tablet   Type 2 diabetes mellitus with obesity (HCC) (Chronic)   Off Metformin       Other Visit Diagnoses       COPD exacerbation (HCC)       Relevant Medications   budesonide -formoterol  (SYMBICORT ) 160-4.5 MCG/ACT inhaler   albuterol  (VENTOLIN  HFA) 108 (90 Base) MCG/ACT inhaler   Other Relevant Orders   PSA   Hemoglobin A1c   Comprehensive metabolic  panel with GFR         Meds ordered this encounter  Medications   ALPRAZolam  (XANAX ) 1 MG tablet    Sig: TAKE 1 TABLET BY MOUTH 2 TIMES DAILY AS NEEDED.    Dispense:  60 tablet    Refill:  1   diclofenac  (VOLTAREN ) 75 MG EC tablet    Sig: Take 1 tablet (75 mg total) by mouth 2 (two) times daily as needed. Take 75 mg by mouth 2 (two) times daily as needed.    Dispense:  180 tablet    Refill:  1   isosorbide  mononitrate (IMDUR ) 30 MG 24 hr tablet    Sig: Take 1 tablet (30 mg total) by mouth daily.    Dispense:  90 tablet    Refill:  3   metoprolol  tartrate (LOPRESSOR ) 50 MG tablet    Sig: Take 1 tablet (50 mg total) by mouth 2 (two) times daily.    Dispense:  180 tablet    Refill:  3   budesonide -formoterol  (SYMBICORT ) 160-4.5 MCG/ACT inhaler    Sig: Inhale 2 puffs into the lungs 2 (two) times daily. INHALE 2 PUFS INTO THE LUNGS TWICE A DAY    Dispense:  10.2 each    Refill:  5   albuterol  (VENTOLIN  HFA) 108 (90 Base) MCG/ACT inhaler    Sig: Inhale 2 puffs into the lungs every 4 (four) hours as needed for wheezing or shortness of breath.    Dispense:  8 g    Refill:  5      Follow-up: Return in about 6 months (around 04/22/2024) for a follow-up visit.  Anitra Barn, MD

## 2023-10-25 LAB — PSA: PSA: 0.64 ng/mL (ref 0.10–4.00)

## 2023-10-27 ENCOUNTER — Ambulatory Visit: Payer: Self-pay | Admitting: Internal Medicine

## 2023-11-20 ENCOUNTER — Other Ambulatory Visit: Payer: Self-pay | Admitting: Internal Medicine

## 2023-11-20 MED ORDER — OZEMPIC (0.25 OR 0.5 MG/DOSE) 2 MG/3ML ~~LOC~~ SOPN: PEN_INJECTOR | SUBCUTANEOUS | 1 refills | Status: AC

## 2023-11-25 ENCOUNTER — Encounter: Payer: Self-pay | Admitting: Internal Medicine

## 2023-11-25 ENCOUNTER — Ambulatory Visit: Payer: Self-pay | Admitting: Internal Medicine

## 2023-11-25 ENCOUNTER — Ambulatory Visit: Admitting: Internal Medicine

## 2023-11-25 VITALS — BP 130/82 | HR 81 | Temp 98.1°F | Ht 72.0 in | Wt 285.0 lb

## 2023-11-25 DIAGNOSIS — R300 Dysuria: Secondary | ICD-10-CM | POA: Diagnosis not present

## 2023-11-25 DIAGNOSIS — N200 Calculus of kidney: Secondary | ICD-10-CM

## 2023-11-25 LAB — URINALYSIS, ROUTINE W REFLEX MICROSCOPIC
Bilirubin Urine: NEGATIVE
Hgb urine dipstick: NEGATIVE
Ketones, ur: NEGATIVE
Nitrite: NEGATIVE
Specific Gravity, Urine: >=1.03 — AB (ref 1.000–1.030)
Total Protein, Urine: NEGATIVE
Urine Glucose: 100 — AB
Urobilinogen, UA: 0.2 (ref 0.0–1.0)
pH: 6 (ref 5.0–8.0)

## 2023-11-25 MED ORDER — TAMSULOSIN HCL 0.4 MG PO CAPS: 0.4000 mg | ORAL_CAPSULE | Freq: Every day | ORAL | 3 refills | Status: DC

## 2023-11-25 MED ORDER — CEFUROXIME AXETIL 250 MG PO TABS: 250.0000 mg | ORAL_TABLET | Freq: Two times a day (BID) | ORAL | 0 refills | Status: AC

## 2023-11-25 NOTE — Progress Notes (Signed)
 Subjective:  Patient ID: [name], male    DOB: 1960/12/06  Age: 63 y.o. MRN: [id number]  CC: Medical Management of Chronic Issues (Pt states he is having an issue with urination. Pt states he noticed his urine is darker and has a strong urine smell.. he noticed when he is urinating it would stop mid stream and start again a/w a burning sensation at times.. Pt stated upon ejaculation last night he felt a pain that shot into his anal area.)   HPI Nettie Cromwell presents for a problem peeing. Pt had pain in the rear after sex.  Medical Management of Chronic Issues (Pt states he is having an issue with urination. Pt states he noticed his urine is darker and has a strong urine smell.. he noticed when he is urinating it would stop mid stream and start again a/w a burning sensation at times.. Pt stated upon ejaculation last night he felt a pain that shot into his anal area.) H/o urinary stones.  Outpatient Medications Prior to Visit  Medication Sig Dispense Refill   albuterol  (VENTOLIN  HFA) 108 (90 Base) MCG/ACT inhaler Inhale 2 puffs into the lungs every 4 (four) hours as needed for wheezing or shortness of breath. 8 g 5   ALPRAZolam  (XANAX ) 1 MG tablet TAKE 1 TABLET BY MOUTH 2 TIMES DAILY AS NEEDED. 60 tablet 1   aspirin  EC 81 MG tablet Take 81 mg by mouth daily. Swallow whole.     budesonide -formoterol  (SYMBICORT ) 160-4.5 MCG/ACT inhaler Inhale 2 puffs into the lungs 2 (two) times daily. INHALE 2 PUFS INTO THE LUNGS TWICE A DAY 10.2 each 5   [START ON 03/10/2024] diclofenac  (VOLTAREN ) 75 MG EC tablet Take 1 tablet (75 mg total) by mouth 2 (two) times daily as needed. Take 75 mg by mouth 2 (two) times daily as needed. 180 tablet 1   isosorbide  mononitrate (IMDUR ) 30 MG 24 hr tablet Take 1 tablet (30 mg total) by mouth daily. 90 tablet 3   metoprolol  tartrate (LOPRESSOR ) 50 MG tablet Take 1 tablet (50 mg total) by mouth 2 (two) times daily. 180 tablet 3    Semaglutide ,0.25 or 0.5MG /DOS, (OZEMPIC , 0.25 OR 0.5 MG/DOSE,) 2 MG/3ML SOPN Use 0.25 mg weekly sq for 1 month, then 0.5 mg sq weekly 9 mL 1   No facility-administered medications prior to visit.    ROS: Review of Systems  Constitutional:  Negative for appetite change, fatigue and unexpected weight change.  HENT:  Negative for congestion, nosebleeds, sneezing, sore throat and trouble swallowing.   Eyes:  Negative for itching and visual disturbance.  Respiratory:  Negative for cough.   Cardiovascular:  Negative for chest pain, palpitations and leg swelling.  Gastrointestinal:  Negative for abdominal distention, blood in stool, diarrhea and nausea.  Genitourinary:  Negative for frequency and hematuria.  Musculoskeletal:  Negative for back pain, gait problem, joint swelling and neck pain.  Skin:  Negative for rash.  Neurological:  Negative for dizziness, tremors, speech difficulty and weakness.  Psychiatric/Behavioral:  Negative for agitation, dysphoric mood, sleep disturbance and suicidal ideas. The patient is not nervous/anxious.     Objective:  BP 130/82   Pulse 81   Temp 98.1 F (36.7 C) (Oral)   Ht 6' (1.829 m)   Wt 285 lb (129.3 kg)   SpO2 95%   BMI 38.65 kg/m   BP Readings from Last 3 Encounters:  11/25/23 130/82  10/22/23 130/70  07/22/23 120/70    Wt Readings from Last 3  Encounters:  11/25/23 285 lb (129.3 kg)  10/22/23 281 lb (127.5 kg)  07/22/23 280 lb (127 kg)    Physical Exam Constitutional:      General: He is not in acute distress.    Appearance: He is well-developed. He is obese.     Comments: NAD  Eyes:     Conjunctiva/sclera: Conjunctivae normal.     Pupils: Pupils are equal, round, and reactive to light.  Neck:     Thyroid : No thyromegaly.     Vascular: No JVD.  Cardiovascular:     Rate and Rhythm: Normal rate and regular rhythm.     Heart sounds: Normal heart sounds. No murmur heard.    No friction rub. No gallop.  Pulmonary:     Effort:  Pulmonary effort is normal. No respiratory distress.     Breath sounds: Normal breath sounds. No wheezing or rales.  Chest:     Chest wall: No tenderness.  Abdominal:     General: Bowel sounds are normal. There is no distension.     Palpations: Abdomen is soft. There is no mass.     Tenderness: There is no abdominal tenderness. There is no guarding or rebound.  Musculoskeletal:        General: No tenderness. Normal range of motion.     Cervical back: Normal range of motion.     Right lower leg: No edema.     Left lower leg: No edema.  Lymphadenopathy:     Cervical: No cervical adenopathy.  Skin:    General: Skin is warm and dry.     Findings: No rash.  Neurological:     Mental Status: He is alert and oriented to person, place, and time.     Cranial Nerves: No cranial nerve deficit.     Motor: No abnormal muscle tone.     Coordination: Coordination normal.     Gait: Gait normal.     Deep Tendon Reflexes: Reflexes are normal and symmetric.  Psychiatric:        Behavior: Behavior normal.        Thought Content: Thought content normal.        Judgment: Judgment normal.     Lab Results  Component Value Date   WBC 7.5 06/09/2023   HGB 16.4 06/09/2023   HCT 49.1 06/09/2023   PLT 137 (L) 06/09/2023   GLUCOSE 151 (H) 10/22/2023   CHOL 153 12/29/2022   TRIG 180 (H) 12/29/2022   HDL 33 (L) 12/29/2022   LDLDIRECT 91.0 08/08/2022   LDLCALC 84 12/29/2022   ALT 25 10/22/2023   AST 15 10/22/2023   NA 140 10/22/2023   K 4.4 10/22/2023   CL 107 10/22/2023   CREATININE 0.82 10/22/2023   BUN 16 10/22/2023   CO2 27 10/22/2023   TSH 2.337 01/04/2023   PSA 0.64 10/22/2023   INR 1.3 (H) 01/02/2023   HGBA1C 6.6 (H) 10/22/2023    DG Chest 2 View Result Date: 06/09/2023 CLINICAL DATA:  Short of breath EXAM: CHEST - 2 VIEW COMPARISON:  CT 06/07/2023, chest radiograph 06/06/2018 FINDINGS: Normal mediastinum and cardiac silhouette. Normal pulmonary vasculature. No evidence of effusion,  infiltrate, or pneumothorax. Mild lingular atelectasis. No acute bony abnormality. IMPRESSION: No active cardiopulmonary disease. Electronically Signed   By: Jackquline Boxer M.D.   On: 06/09/2023 11:11    Assessment & Plan:   Problem List Items Addressed This Visit     Dysuria   Possible UTI or prostatitis.  History of  recurrent kidney stones.   Check UA Empiric abx -cefuroxime  Hydrate better Flomax  po Renal ultrasound ordered       Relevant Orders   US  RENAL   Urinalysis   Kidney stones - Primary   Recurrent Once a year passing sones up to 16 at the time Last time - June 2025 Obtain UA, renal ultrasound      Relevant Orders   US  RENAL   Urinalysis      Meds ordered this encounter  Medications   tamsulosin  (FLOMAX ) 0.4 MG CAPS capsule    Sig: Take 1 capsule (0.4 mg total) by mouth daily.    Dispense:  30 capsule    Refill:  3   cefUROXime  (CEFTIN ) 250 MG tablet    Sig: Take 1 tablet (250 mg total) by mouth 2 (two) times daily with a meal for 10 days.    Dispense:  20 tablet    Refill:  0      Follow-up: Return in about 6 weeks (around 01/06/2024) for a follow-up visit.  Marolyn Noel, MD

## 2023-11-25 NOTE — Assessment & Plan Note (Addendum)
 Possible UTI or prostatitis.  History of recurrent kidney stones.   Check UA Empiric abx -cefuroxime  Hydrate better Flomax  po Renal ultrasound ordered

## 2023-11-25 NOTE — Assessment & Plan Note (Addendum)
 Recurrent Once a year passing sones up to 16 at the time Last time - June 2025 Obtain UA, renal ultrasound

## 2023-11-26 ENCOUNTER — Inpatient Hospital Stay
Admission: RE | Admit: 2023-11-26 | Discharge: 2023-11-26 | Discharge status: Home / Self Care | Source: Ambulatory Visit | Attending: Internal Medicine | Admitting: Internal Medicine

## 2023-11-26 DIAGNOSIS — N200 Calculus of kidney: Secondary | ICD-10-CM

## 2023-11-26 DIAGNOSIS — N281 Cyst of kidney, acquired: Secondary | ICD-10-CM | POA: Diagnosis not present

## 2023-11-26 DIAGNOSIS — R300 Dysuria: Secondary | ICD-10-CM

## 2024-01-22 ENCOUNTER — Encounter: Payer: Self-pay | Admitting: Internal Medicine

## 2024-01-22 ENCOUNTER — Ambulatory Visit: Admitting: Internal Medicine

## 2024-01-22 VITALS — BP 136/86 | HR 69 | Temp 97.8°F | Ht 72.0 in | Wt 280.0 lb

## 2024-01-22 DIAGNOSIS — I10 Essential (primary) hypertension: Secondary | ICD-10-CM

## 2024-01-22 DIAGNOSIS — E1169 Type 2 diabetes mellitus with other specified complication: Secondary | ICD-10-CM

## 2024-01-22 DIAGNOSIS — I25110 Atherosclerotic heart disease of native coronary artery with unstable angina pectoris: Secondary | ICD-10-CM

## 2024-01-22 DIAGNOSIS — Z6837 Body mass index (BMI) 37.0-37.9, adult: Secondary | ICD-10-CM

## 2024-01-22 DIAGNOSIS — E669 Obesity, unspecified: Secondary | ICD-10-CM

## 2024-01-22 DIAGNOSIS — R300 Dysuria: Secondary | ICD-10-CM

## 2024-01-22 DIAGNOSIS — F419 Anxiety disorder, unspecified: Secondary | ICD-10-CM

## 2024-01-22 LAB — COMPREHENSIVE METABOLIC PANEL WITH GFR
ALT: 31 U/L (ref 0–53)
AST: 17 U/L (ref 0–37)
Albumin: 4.3 g/dL (ref 3.5–5.2)
Alkaline Phosphatase: 57 U/L (ref 39–117)
BUN: 19 mg/dL (ref 6–23)
CO2: 25 meq/L (ref 19–32)
Calcium: 9.4 mg/dL (ref 8.4–10.5)
Chloride: 105 meq/L (ref 96–112)
Creatinine, Ser: 0.87 mg/dL (ref 0.40–1.50)
GFR: 92.98 mL/min (ref >60.00)
Glucose, Bld: 144 mg/dL — ABNORMAL HIGH (ref 70–99)
Potassium: 4.2 meq/L (ref 3.5–5.1)
Sodium: 137 meq/L (ref 135–145)
Total Bilirubin: 1 mg/dL (ref 0.2–1.2)
Total Protein: 6.8 g/dL (ref 6.0–8.3)

## 2024-01-22 LAB — CBC WITH DIFFERENTIAL/PLATELET
Basophils Absolute: 0 K/uL (ref 0.0–0.1)
Basophils Relative: 0.5 % (ref 0.0–3.0)
Eosinophils Absolute: 0.2 K/uL (ref 0.0–0.7)
Eosinophils Relative: 2.8 % (ref 0.0–5.0)
HCT: 49 % (ref 39.0–52.0)
Hemoglobin: 17.1 g/dL — ABNORMAL HIGH (ref 13.0–17.0)
Lymphocytes Relative: 28.7 % (ref 12.0–46.0)
Lymphs Abs: 2 K/uL (ref 0.7–4.0)
MCHC: 34.9 g/dL (ref 30.0–36.0)
MCV: 89.7 fl (ref 78.0–100.0)
Monocytes Absolute: 0.6 K/uL (ref 0.1–1.0)
Monocytes Relative: 9.1 % (ref 3.0–12.0)
Neutro Abs: 4.1 K/uL (ref 1.4–7.7)
Neutrophils Relative %: 58.9 % (ref 43.0–77.0)
Platelets: 131 K/uL — ABNORMAL LOW (ref 150.0–400.0)
RBC: 5.46 Mil/uL (ref 4.22–5.81)
RDW: 13.5 % (ref 11.5–15.5)
WBC: 6.9 K/uL (ref 4.0–10.5)

## 2024-01-22 LAB — HEMOGLOBIN A1C: Hgb A1c MFr Bld: 6.9 % — ABNORMAL HIGH (ref 4.6–6.5)

## 2024-01-22 NOTE — Assessment & Plan Note (Addendum)
 Possible UTI or prostatitis - resolved.  Check UA again Cont w/Flomax  po Renal ultrasound - ok

## 2024-01-22 NOTE — Assessment & Plan Note (Signed)
 Cont on Lipitor, ASA, Imdur, Lopressor

## 2024-01-22 NOTE — Assessment & Plan Note (Signed)
Chronic Cont on Alprazolam prn  Potential benefits of a long term benzodiazepines  use as well as potential risks  and complications were explained to the patient and were aknowledged.

## 2024-01-22 NOTE — Progress Notes (Signed)
 Subjective:  Patient ID: [name], male    DOB: 14-Apr-1961  Age: 63 y.o. MRN: [id number]  CC: Follow-up (Patient states he is good and has no concerns to discuss. )   HPI [name] presents for anxiety, UTI, HTN  Outpatient Medications Prior to Visit  Medication Sig Dispense Refill   albuterol  (VENTOLIN  HFA) 108 (90 Base) MCG/ACT inhaler Inhale 2 puffs into the lungs every 4 (four) hours as needed for wheezing or shortness of breath. 8 g 5   ALPRAZolam  (XANAX ) 1 MG tablet TAKE 1 TABLET BY MOUTH 2 TIMES DAILY AS NEEDED. 60 tablet 1   aspirin  EC 81 MG tablet Take 81 mg by mouth daily. Swallow whole.     budesonide -formoterol  (SYMBICORT ) 160-4.5 MCG/ACT inhaler Inhale 2 puffs into the lungs 2 (two) times daily. INHALE 2 PUFS INTO THE LUNGS TWICE A DAY 10.2 each 5   [START ON 03/10/2024] diclofenac  (VOLTAREN ) 75 MG EC tablet Take 1 tablet (75 mg total) by mouth 2 (two) times daily as needed. Take 75 mg by mouth 2 (two) times daily as needed. 180 tablet 1   isosorbide  mononitrate (IMDUR ) 30 MG 24 hr tablet Take 1 tablet (30 mg total) by mouth daily. 90 tablet 3   metoprolol  tartrate (LOPRESSOR ) 50 MG tablet Take 1 tablet (50 mg total) by mouth 2 (two) times daily. 180 tablet 3   tamsulosin  (FLOMAX ) 0.4 MG CAPS capsule Take 1 capsule (0.4 mg total) by mouth daily. 30 capsule 3   Semaglutide ,0.25 or 0.5MG /DOS, (OZEMPIC , 0.25 OR 0.5 MG/DOSE,) 2 MG/3ML SOPN Use 0.25 mg weekly sq for 1 month, then 0.5 mg sq weekly (Patient not taking: Reported on 01/22/2024) 9 mL 1   No facility-administered medications prior to visit.    ROS: Review of Systems  Constitutional:  Negative for appetite change, fatigue and unexpected weight change.  HENT:  Negative for congestion, nosebleeds, sneezing, sore throat and trouble swallowing.   Eyes:  Negative for itching and visual disturbance.  Respiratory:  Negative for cough.   Cardiovascular:  Negative for chest pain,  palpitations and leg swelling.  Gastrointestinal:  Negative for abdominal distention, blood in stool, diarrhea and nausea.  Genitourinary:  Negative for frequency and hematuria.  Musculoskeletal:  Negative for back pain, gait problem, joint swelling and neck pain.  Skin:  Negative for rash.  Neurological:  Negative for dizziness, tremors, speech difficulty and weakness.  Psychiatric/Behavioral:  Negative for agitation, dysphoric mood, sleep disturbance and suicidal ideas. The patient is not nervous/anxious.     Objective:  BP 136/86   Pulse 69   Temp 97.8 F (36.6 C) (Oral)   Ht 6' (1.829 m)   Wt 280 lb (127 kg)   SpO2 93%   BMI 37.97 kg/m   BP Readings from Last 3 Encounters:  01/22/24 136/86  11/25/23 130/82  10/22/23 130/70    Wt Readings from Last 3 Encounters:  01/22/24 280 lb (127 kg)  11/25/23 285 lb (129.3 kg)  10/22/23 281 lb (127.5 kg)    Physical Exam Constitutional:      General: He is not in acute distress.    Appearance: He is well-developed. He is obese.     Comments: NAD  Eyes:     Conjunctiva/sclera: Conjunctivae normal.     Pupils: Pupils are equal, round, and reactive to light.  Neck:     Thyroid : No thyromegaly.     Vascular: No JVD.  Cardiovascular:     Rate and Rhythm: Normal  rate and regular rhythm.     Heart sounds: Normal heart sounds. No murmur heard.    No friction rub. No gallop.  Pulmonary:     Effort: Pulmonary effort is normal. No respiratory distress.     Breath sounds: Normal breath sounds. No wheezing or rales.  Chest:     Chest wall: No tenderness.  Abdominal:     General: Bowel sounds are normal. There is no distension.     Palpations: Abdomen is soft. There is no mass.     Tenderness: There is no abdominal tenderness. There is no guarding or rebound.  Musculoskeletal:        General: No tenderness. Normal range of motion.     Cervical back: Normal range of motion.     Right lower leg: No edema.     Left lower leg: No  edema.  Lymphadenopathy:     Cervical: No cervical adenopathy.  Skin:    General: Skin is warm and dry.     Findings: No rash.  Neurological:     Mental Status: He is alert and oriented to person, place, and time.     Cranial Nerves: No cranial nerve deficit.     Motor: No abnormal muscle tone.     Coordination: Coordination normal.     Gait: Gait normal.     Deep Tendon Reflexes: Reflexes are normal and symmetric.  Psychiatric:        Behavior: Behavior normal.        Thought Content: Thought content normal.        Judgment: Judgment normal.     Lab Results  Component Value Date   WBC 7.5 06/09/2023   HGB 16.4 06/09/2023   HCT 49.1 06/09/2023   PLT 137 (L) 06/09/2023   GLUCOSE 151 (H) 10/22/2023   CHOL 153 12/29/2022   TRIG 180 (H) 12/29/2022   HDL 33 (L) 12/29/2022   LDLDIRECT 91.0 08/08/2022   LDLCALC 84 12/29/2022   ALT 25 10/22/2023   AST 15 10/22/2023   NA 140 10/22/2023   K 4.4 10/22/2023   CL 107 10/22/2023   CREATININE 0.82 10/22/2023   BUN 16 10/22/2023   CO2 27 10/22/2023   TSH 2.337 01/04/2023   PSA 0.64 10/22/2023   INR 1.3 (H) 01/02/2023   HGBA1C 6.6 (H) 10/22/2023    US  RENAL Result Date: 11/26/2023 CLINICAL DATA:  Renal stone, dysuria EXAM: RENAL / URINARY TRACT ULTRASOUND COMPLETE COMPARISON:  CT Renal Stone protocol August 01, 2015 FINDINGS: Right Kidney: Renal measurements: 12.7 x 5.9 x 6.7 cm = volume: 264 mL. Echogenicity within normal limits. No mass or hydronephrosis visualized. Left Kidney: Renal measurements: 12.3 x 5.6 x 5.5 cm = volume: 196 mL. Echogenicity within normal limits. Lower pole cyst measuring 1.4 cm. No solid mass or hydronephrosis visualized. Bladder: Appears normal for degree of bladder distention. Other: Prominent spleen measuring 11.9 x 11.5 x 5.8 cm. IMPRESSION: Left kidney lower pole cortical cyst. No nephrolithiasis or hydronephrosis. Electronically Signed   By: Megan  Zare M.D.   On: 11/26/2023 14:23    Assessment & Plan:    Problem List Items Addressed This Visit     Anxiety disorder   Chronic Cont on Alprazolam  prn  Potential benefits of a long term benzodiazepines  use as well as potential risks  and complications were explained to the patient and were aknowledged.      Coronary artery disease involving native coronary artery of native heart with unstable angina pectoris (  HCC) (Chronic)   Cont on Lipitor , ASA, Imdur , Lopressor       Relevant Orders   Comprehensive metabolic panel with GFR   CBC with Differential/Platelet   Dysuria   Possible UTI or prostatitis - resolved.  Check UA again Cont w/Flomax  po Renal ultrasound - ok       Essential hypertension - Primary (Chronic)   Chronic.  Continue metoprolol .  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     Follow-up: Return in about 3 months (around 04/22/2024) for a follow-up visit.  Marolyn Noel, MD

## 2024-01-22 NOTE — Assessment & Plan Note (Signed)
Chronic.  Continue metoprolol.  Pursue weight loss effort

## 2024-01-26 ENCOUNTER — Telehealth: Payer: Self-pay | Admitting: Radiology

## 2024-01-26 NOTE — Telephone Encounter (Signed)
 Copied from CRM [id number]. Topic: Clinical - Medication Question >> Jan 26, 2024 10:30 AM Anairis L wrote: Reason for CRM: [facility] is reaching out because they need Dx code for medication to be filled. Please call Phone: [phone].  Patient has only two tablets left.   Please call patient with update.   Thank you.

## 2024-01-27 ENCOUNTER — Other Ambulatory Visit: Payer: Self-pay

## 2024-01-27 ENCOUNTER — Ambulatory Visit: Payer: Self-pay | Admitting: Internal Medicine

## 2024-01-27 ENCOUNTER — Telehealth: Payer: Self-pay | Admitting: Radiology

## 2024-01-27 DIAGNOSIS — D751 Secondary polycythemia: Secondary | ICD-10-CM | POA: Insufficient documentation

## 2024-01-27 NOTE — Assessment & Plan Note (Signed)
 Chronic.  Likely related to smoking, COPD Has been using CPAP machine regular

## 2024-01-27 NOTE — Telephone Encounter (Signed)
 Copied from CRM [id number]. Topic: Clinical - Medication Question >> Jan 26, 2024 10:30 AM Anairis L wrote: Reason for CRM: [facility] is reaching out because they need Dx code for medication to be filled. Please call Phone: [phone].  Patient has only two tablets left.   Please call patient with update.   Thank you. >> Jan 27, 2024 10:40 AM Deleta RAMAN wrote: Please contact patient regarding updates

## 2024-01-27 NOTE — Telephone Encounter (Signed)
 Spoke to and gave pharmacy the Dx code for his medication.    Curtistine Quiet, CMA

## 2024-01-29 NOTE — Telephone Encounter (Signed)
 Completed.

## 2024-03-17 ENCOUNTER — Other Ambulatory Visit: Payer: Self-pay | Admitting: Internal Medicine

## 2024-03-19 ENCOUNTER — Other Ambulatory Visit: Payer: Self-pay | Admitting: Internal Medicine

## 2024-05-04 ENCOUNTER — Ambulatory Visit: Admitting: Internal Medicine

## 2024-05-18 ENCOUNTER — Encounter: Payer: Self-pay | Admitting: Internal Medicine

## 2024-05-18 ENCOUNTER — Ambulatory Visit: Admitting: Internal Medicine

## 2024-05-18 VITALS — BP 142/84 | HR 64 | Ht 72.0 in | Wt 279.4 lb

## 2024-05-18 DIAGNOSIS — I10 Essential (primary) hypertension: Secondary | ICD-10-CM | POA: Diagnosis not present

## 2024-05-18 DIAGNOSIS — R051 Acute cough: Secondary | ICD-10-CM | POA: Diagnosis not present

## 2024-05-18 DIAGNOSIS — R739 Hyperglycemia, unspecified: Secondary | ICD-10-CM | POA: Diagnosis not present

## 2024-05-18 DIAGNOSIS — L0292 Furuncle, unspecified: Secondary | ICD-10-CM | POA: Diagnosis not present

## 2024-05-18 DIAGNOSIS — M79605 Pain in left leg: Secondary | ICD-10-CM | POA: Diagnosis not present

## 2024-05-18 DIAGNOSIS — M545 Low back pain, unspecified: Secondary | ICD-10-CM | POA: Diagnosis not present

## 2024-05-18 DIAGNOSIS — Z0000 Encounter for general adult medical examination without abnormal findings: Secondary | ICD-10-CM

## 2024-05-18 LAB — COMPREHENSIVE METABOLIC PANEL WITH GFR
ALT: 32 U/L (ref 3–53)
AST: 19 U/L (ref 5–37)
Albumin: 4.4 g/dL (ref 3.5–5.2)
Alkaline Phosphatase: 56 U/L (ref 39–117)
BUN: 19 mg/dL (ref 6–23)
CO2: 26 meq/L (ref 19–32)
Calcium: 9.3 mg/dL (ref 8.4–10.5)
Chloride: 107 meq/L (ref 96–112)
Creatinine, Ser: 0.73 mg/dL (ref 0.40–1.50)
GFR: 97.82 mL/min
Glucose, Bld: 119 mg/dL — ABNORMAL HIGH (ref 70–99)
Potassium: 4.3 meq/L (ref 3.5–5.1)
Sodium: 139 meq/L (ref 135–145)
Total Bilirubin: 1.1 mg/dL (ref 0.2–1.2)
Total Protein: 6.9 g/dL (ref 6.0–8.3)

## 2024-05-18 LAB — HEMOGLOBIN A1C: Hgb A1c MFr Bld: 6.6 % — ABNORMAL HIGH (ref 4.6–6.5)

## 2024-05-18 MED ORDER — HYDROCODONE BIT-HOMATROP MBR 5-1.5 MG/5ML PO SOLN: 5.0000 mL | Freq: Three times a day (TID) | ORAL | 0 refills | Status: AC | PRN

## 2024-05-18 MED ORDER — PREGABALIN 75 MG PO CAPS: 75.0000 mg | ORAL_CAPSULE | Freq: Two times a day (BID) | ORAL | 3 refills | Status: AC

## 2024-05-18 MED ORDER — DOXYCYCLINE HYCLATE 100 MG PO TABS: 100.0000 mg | ORAL_TABLET | Freq: Two times a day (BID) | ORAL | 0 refills | Status: AC

## 2024-05-18 NOTE — Assessment & Plan Note (Addendum)
 3x5 cm infiltrate on porx R inner thigh.S/p spontaneous rupture... Start Doxy Epsom salt paste

## 2024-05-18 NOTE — Assessment & Plan Note (Signed)
 Post-URI  Hycodan prn

## 2024-05-18 NOTE — Assessment & Plan Note (Signed)
Chronic.  Continue metoprolol.  Pursue weight loss effort

## 2024-05-18 NOTE — Assessment & Plan Note (Addendum)
 Chronic  Start Lyrica   Potential benefits of a long term Lyrica  use as well as potential risks (i.e. addiction risk, apnea etc) and complications (i.e. Somnolence, constipation and others) were explained to the patient and were aknowledged. Wt loss

## 2024-05-18 NOTE — Assessment & Plan Note (Addendum)
 Wt Readings from Last 3 Encounters:  05/18/24 279 lb 6.4 oz (126.7 kg)  01/22/24 280 lb (127 kg)  11/25/23 285 lb (129.3 kg)   Wt loss med options were discussed

## 2024-05-18 NOTE — Progress Notes (Signed)
 "  Subjective:  Patient ID: [name], male    DOB: 1961/04/20  Age: 64 y.o. MRN: [id number]  CC: Medical Management of Chronic Issues (3 Month follow up. Left flank, back, leg pain (requesting pregablin for this))   HPI [name] presents for L back pain irrad to the L leg off and on for 1 month C/o cyst on the R inner thigh  - large; blood came out F/u on HTN, elev glucose  Outpatient Medications Prior to Visit  Medication Sig Dispense Refill   albuterol  (VENTOLIN  HFA) 108 (90 Base) MCG/ACT inhaler Inhale 2 puffs into the lungs every 4 (four) hours as needed for wheezing or shortness of breath. 8 g 5   ALPRAZolam  (XANAX ) 1 MG tablet TAKE 1 TABLET BY MOUTH TWICE A DAY AS NEEDED 60 tablet 3   aspirin  EC 81 MG tablet Take 81 mg by mouth daily. Swallow whole.     budesonide -formoterol  (SYMBICORT ) 160-4.5 MCG/ACT inhaler Inhale 2 puffs into the lungs 2 (two) times daily. INHALE 2 PUFS INTO THE LUNGS TWICE A DAY 10.2 each 5   diclofenac  (VOLTAREN ) 75 MG EC tablet Take 1 tablet (75 mg total) by mouth 2 (two) times daily as needed. Take 75 mg by mouth 2 (two) times daily as needed. 180 tablet 1   isosorbide  mononitrate (IMDUR ) 30 MG 24 hr tablet Take 1 tablet (30 mg total) by mouth daily. 90 tablet 3   metoprolol  tartrate (LOPRESSOR ) 50 MG tablet Take 1 tablet (50 mg total) by mouth 2 (two) times daily. 180 tablet 3   tamsulosin  (FLOMAX ) 0.4 MG CAPS capsule TAKE 1 CAPSULE BY MOUTH EVERY DAY 30 capsule 3   Semaglutide ,0.25 or 0.5MG /DOS, (OZEMPIC , 0.25 OR 0.5 MG/DOSE,) 2 MG/3ML SOPN Use 0.25 mg weekly sq for 1 month, then 0.5 mg sq weekly (Patient not taking: Reported on 05/18/2024) 9 mL 1   No facility-administered medications prior to visit.    ROS: Review of Systems  Constitutional:  Negative for appetite change, fatigue and unexpected weight change.  HENT:  Negative for congestion, nosebleeds, sneezing, sore throat and trouble swallowing.   Eyes:  Negative for  itching and visual disturbance.  Respiratory:  Negative for cough.   Cardiovascular:  Negative for chest pain, palpitations and leg swelling.  Gastrointestinal:  Negative for abdominal distention, blood in stool, diarrhea and nausea.  Genitourinary:  Negative for frequency and hematuria.  Musculoskeletal:  Positive for back pain. Negative for gait problem, joint swelling and neck pain.  Skin:  Negative for rash.  Neurological:  Negative for dizziness, tremors, speech difficulty and weakness.  Psychiatric/Behavioral:  Negative for agitation, dysphoric mood, sleep disturbance and suicidal ideas. The patient is not nervous/anxious.     Objective:  BP (!) 142/84   Pulse 64   Ht 6' (1.829 m)   Wt 279 lb 6.4 oz (126.7 kg)   SpO2 95%   BMI 37.89 kg/m   BP Readings from Last 3 Encounters:  05/18/24 (!) 142/84  01/22/24 136/86  11/25/23 130/82    Wt Readings from Last 3 Encounters:  05/18/24 279 lb 6.4 oz (126.7 kg)  01/22/24 280 lb (127 kg)  11/25/23 285 lb (129.3 kg)    Physical Exam Mild keloid areas on chest scar 3x5 cm infiltrate on porx R inner thigh Lab Results  Component Value Date   WBC 6.9 01/22/2024   HGB 17.1 (H) 01/22/2024   HCT 49.0 01/22/2024   PLT 131.0 (L) 01/22/2024   GLUCOSE 144 (H)  01/22/2024   CHOL 153 12/29/2022   TRIG 180 (H) 12/29/2022   HDL 33 (L) 12/29/2022   LDLDIRECT 91.0 08/08/2022   LDLCALC 84 12/29/2022   ALT 31 01/22/2024   AST 17 01/22/2024   NA 137 01/22/2024   K 4.2 01/22/2024   CL 105 01/22/2024   CREATININE 0.87 01/22/2024   BUN 19 01/22/2024   CO2 25 01/22/2024   TSH 2.337 01/04/2023   PSA 0.64 10/22/2023   INR 1.3 (H) 01/02/2023   HGBA1C 6.9 (H) 01/22/2024    US  RENAL Result Date: 11/26/2023 CLINICAL DATA:  Renal stone, dysuria EXAM: RENAL / URINARY TRACT ULTRASOUND COMPLETE COMPARISON:  CT Renal Stone protocol August 01, 2015 FINDINGS: Right Kidney: Renal measurements: 12.7 x 5.9 x 6.7 cm = volume: 264 mL. Echogenicity within  normal limits. No mass or hydronephrosis visualized. Left Kidney: Renal measurements: 12.3 x 5.6 x 5.5 cm = volume: 196 mL. Echogenicity within normal limits. Lower pole cyst measuring 1.4 cm. No solid mass or hydronephrosis visualized. Bladder: Appears normal for degree of bladder distention. Other: Prominent spleen measuring 11.9 x 11.5 x 5.8 cm. IMPRESSION: Left kidney lower pole cortical cyst. No nephrolithiasis or hydronephrosis. Electronically Signed   By: Megan  Zare M.D.   On: 11/26/2023 14:23    Assessment & Plan:   Problem List Items Addressed This Visit     Morbid obesity (HCC) (Chronic)   Wt Readings from Last 3 Encounters:  05/18/24 279 lb 6.4 oz (126.7 kg)  01/22/24 280 lb (127 kg)  11/25/23 285 lb (129.3 kg)   Wt loss med options were discussed      Essential hypertension - Primary (Chronic)   Chronic.  Continue metoprolol .  Pursue weight loss effort      Low back pain radiating to left lower extremity   Chronic  Start Lyrica   Potential benefits of a long term Lyrica  use as well as potential risks (i.e. addiction risk, apnea etc) and complications (i.e. Somnolence, constipation and others) were explained to the patient and were aknowledged. Wt loss       Well adult exam   Cough   Post-URI  Hycodan prn      Boil   3x5 cm infiltrate on porx R inner thigh.S/p spontaneous rupture... Start Doxy Epsom salt paste       Other Visit Diagnoses       Hyperglycemia       Relevant Orders   Comprehensive metabolic panel with GFR   Hemoglobin A1c         Meds ordered this encounter  Medications   doxycycline  (VIBRA -TABS) 100 MG tablet    Sig: Take 1 tablet (100 mg total) by mouth 2 (two) times daily.    Dispense:  20 tablet    Refill:  0   HYDROcodone  bit-homatropine (HYCODAN) 5-1.5 MG/5ML syrup    Sig: Take 5 mLs by mouth every 8 (eight) hours as needed for cough.    Dispense:  240 mL    Refill:  0   pregabalin  (LYRICA ) 75 MG capsule    Sig: Take 1  capsule (75 mg total) by mouth 2 (two) times daily.    Dispense:  60 capsule    Refill:  3      Follow-up: Return in about 3 months (around 08/16/2024) for a follow-up visit.  Marolyn Noel, MD "

## 2024-05-28 ENCOUNTER — Ambulatory Visit: Payer: Self-pay | Admitting: Internal Medicine

## 2024-08-17 ENCOUNTER — Ambulatory Visit: Admitting: Internal Medicine
